# Patient Record
Sex: Male | Born: 1935 | Race: White | Hispanic: No | Smoking: Former smoker
Health system: Southern US, Community
[De-identification: ages and names within clinical notes are randomized; demographics above are authoritative.]

## PROBLEM LIST (undated history)

## (undated) DIAGNOSIS — M4800 Spinal stenosis, site unspecified: Secondary | ICD-10-CM

## (undated) DIAGNOSIS — I639 Cerebral infarction, unspecified: Secondary | ICD-10-CM

## (undated) DIAGNOSIS — C801 Malignant (primary) neoplasm, unspecified: Secondary | ICD-10-CM

## (undated) DIAGNOSIS — G709 Myoneural disorder, unspecified: Secondary | ICD-10-CM

## (undated) DIAGNOSIS — G4730 Sleep apnea, unspecified: Secondary | ICD-10-CM

## (undated) DIAGNOSIS — S83209A Unspecified tear of unspecified meniscus, current injury, unspecified knee, initial encounter: Secondary | ICD-10-CM

## (undated) DIAGNOSIS — M1990 Unspecified osteoarthritis, unspecified site: Secondary | ICD-10-CM

## (undated) DIAGNOSIS — L509 Urticaria, unspecified: Secondary | ICD-10-CM

## (undated) DIAGNOSIS — K589 Irritable bowel syndrome without diarrhea: Secondary | ICD-10-CM

## (undated) HISTORY — PX: KNEE ARTHROSCOPY: SUR90

## (undated) HISTORY — DX: Spinal stenosis, site unspecified: M48.00

## (undated) HISTORY — DX: Irritable bowel syndrome, unspecified: K58.9

## (undated) HISTORY — PX: HEMORRHOID SURGERY: SHX153

## (undated) HISTORY — PX: COLONOSCOPY: SHX174

## (undated) HISTORY — PX: APPENDECTOMY: SHX54

## (undated) HISTORY — PX: SHOULDER ARTHROSCOPY WITH ROTATOR CUFF REPAIR AND SUBACROMIAL DECOMPRESSION: SHX5686

---

## 1999-05-27 ENCOUNTER — Ambulatory Visit (HOSPITAL_COMMUNITY): Admission: RE | Admit: 1999-05-27 | Discharge: 1999-05-27 | Payer: Self-pay | Admitting: Gastroenterology

## 1999-12-21 ENCOUNTER — Ambulatory Visit (HOSPITAL_COMMUNITY): Admission: RE | Admit: 1999-12-21 | Discharge: 1999-12-21 | Payer: Self-pay | Admitting: Orthopedic Surgery

## 2002-02-24 ENCOUNTER — Ambulatory Visit (HOSPITAL_COMMUNITY): Admission: RE | Admit: 2002-02-24 | Discharge: 2002-02-24 | Payer: Self-pay | Admitting: Gastroenterology

## 2004-01-28 ENCOUNTER — Observation Stay (HOSPITAL_COMMUNITY): Admission: RE | Admit: 2004-01-28 | Discharge: 2004-01-29 | Payer: Self-pay | Admitting: Orthopedic Surgery

## 2005-05-16 ENCOUNTER — Encounter: Admission: RE | Admit: 2005-05-16 | Discharge: 2005-05-16 | Payer: Self-pay | Admitting: Internal Medicine

## 2008-06-29 ENCOUNTER — Ambulatory Visit (HOSPITAL_COMMUNITY): Admission: RE | Admit: 2008-06-29 | Discharge: 2008-06-29 | Payer: Self-pay | Admitting: Gastroenterology

## 2008-07-28 ENCOUNTER — Ambulatory Visit (HOSPITAL_COMMUNITY): Admission: RE | Admit: 2008-07-28 | Discharge: 2008-07-28 | Payer: Self-pay | Admitting: Gastroenterology

## 2010-03-17 ENCOUNTER — Other Ambulatory Visit: Payer: Self-pay | Admitting: Psychiatry

## 2010-03-23 ENCOUNTER — Ambulatory Visit
Admission: RE | Admit: 2010-03-23 | Discharge: 2010-03-23 | Disposition: A | Discharge status: Home / Self Care | Payer: Medicare Other | Source: Ambulatory Visit | Attending: Psychiatry | Admitting: Psychiatry

## 2010-06-07 NOTE — Op Note (Signed)
Jose [name], Jose [name]         ACCOUNT NO.:  [id number]   MEDICAL RECORD NO.:  [id number]          PATIENT TYPE:  AMB   LOCATION:  ENDO                         FACILITY:  [facility]   PHYSICIAN:  John C. Madilyn Fireman, M.D.    DATE OF BIRTH:  29-Oct-1935   DATE OF PROCEDURE:  07/28/2008  DATE OF DISCHARGE:                               OPERATIVE REPORT   INDICATIONS FOR PROCEDURE:  Pyloric stenosis.   PROCEDURE:  The patient was placed in the left lateral decubitus  position and placed on the pulse monitor with continuous low-flow oxygen  delivered by nasal cannula.  He was sedated with 150 mcg IV fentanyl and  15.5 mg IV Versed.  Olympus video endoscope was advanced under direct  vision into the oropharynx and esophagus.  The esophagus was straight  and of normal caliber with the squamocolumnar line at 38 cm and the  stomach was significantly dilated and had some old food scattered around  the lumen in various places.  There appeared to be somewhat of an ovoid  deformity the pylorus and the scope was advanced to it and it did not  appear to be too small to admit passage of the scope but due to bowing  in the elongated stomach, the scope would not pass through it.  The  scope was withdrawn and a standard colonoscope inserted down to the  pylorus but despite the larger scope's longer length, I could not force  it through the opening although it seemed large enough to admit the  scope with the scope looping back into the elongated stomach.  Finally,  I tried a pediatric colonoscope and was able to pop through what  appeared to be the pylorus, but just beyond that there was a second  narrowing with similar appearance and the scope was popped through that  into the duodenum.  This appeared to be consistent with previous  endoscopies which showed a false pylorus appearance and it was difficult  to tell whether the first or second narrowing was the true pylorus.  After these attempts were done, both  apparently stenotic areas were  bloodied and friable and neither one appeared significantly narrowed and  I did not do a balloon dilatation.  The scope was then withdrawn and the  patient returned to the recovery room in stable condition.  He tolerated  the procedure well.  There were no immediate complications.   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   PLAN:  Since the patient's symptoms have improved since his initial  presentation, we will continue medical therapy with acid blockade and  Reglan and see him back in the office.  Depending on how he does, will  consider repeating study with balloon dilatation attempts.           ______________________________  Everardo All Madilyn Fireman, M.D.     JCH/MEDQ  D:  07/28/2008  T:  07/28/2008  Job:  [id number] 

## 2010-06-10 NOTE — Procedures (Signed)
[place]  Patient:    Jose [name], Jose [name]                MRN: [id number] Proc. Date: 05/27/99 Adm. Date:  [id number] Disc. Date: [id number] Attending:  Louie Bun CC:         Georg Ruddle. Viviann Spare, M.D.                           Procedure Report  PROCEDURE:  Esophagogastroduodenoscopy with biopsy.  ENDOSCOPIST:  Everardo All. Madilyn Fireman, M.D.  ANESTHESIA:  INDICATIONS FOR PROCEDURE:  Chronic dyspeptic complaints with findings seven years ago on EGD consistent with false pylorus appearance, probably from previous healed peptic ulcer disease.  He had had chronic continued dyspepsia despite frequent se of acid blocking medications, and we decided to reassess the anatomy of his upper GI tract at this time.  DESCRIPTION OF PROCEDURE:  The patient was placed in the left lateral decubitus  position and placed on the pulse monitor with continuous low flow oxygen delivered by nasal cannula.  He was sedated with 100 mg of IV Demerol and 9 mg of IV Versed. The Olympus video endoscope was advanced under direct vision into the oropharynx and esophagus.  The esophagus was straight and of normal caliber at the squamocolumnar line at 38 cm.  There was no visible hiatal hernia, ring stricture, or other abnormality at the GE junction.  The stomach was entered and a small amount of liquid secretions were suctioned from the fundus.  Retroflexed view of the cardia was unremarkable.  The fundus and body appeared normal.  There was no retained food in the stomach.  The abdomen appeared normal with the exception of a slit-like exit which appeared to be a false narrowing, not the true pylorus. This was approached and it was fairly easily intubated with the scope, and there was a slight dilated area beyond it followed by what appeared to be the true pylorus.  The length between the two narrowed areas appeared to be no more than 2 cm. The pylorus itself was  not deformed and there was no discrete ulcer or erosion noted in the vicinity of the false or true pylorus.  The duodenum was entered.  Both the  bulb and second portion were well-inspected and appeared to be within normal limits.  Small bowel biopsies were obtained to rule out a sprue-like lesion and a CLOtest was also obtained.  The scope was then withdrawn and the patient returned to the recovery room in stable condition.  He tolerated the procedure well and there were no immediate complications.  IMPRESSION:  Distal antral deformity, probably from previous peptic ulcer disease, otherwise normal endoscopy.  No evidence endoscopically for gastric outlet obstruction or gastroparesis.  PLAN:  Will await CLOtest and small bowel biopsy, and make further recommendations. DD:  05/27/99 TD:  05/31/99 Job: [id number]

## 2010-06-10 NOTE — Op Note (Signed)
   NAME:  Jose [name], KARMAN                   ACCOUNT NO.:  [id number]   MEDICAL RECORD NO.:  [id number]                   PATIENT TYPE:  AMB   LOCATION:  ENDO                                 [facility]ACILITY:  [facility]   PHYSICIAN:  John C. Madilyn Fireman, M.D.                 DATE OF BIRTH:  1935-09-27   DATE OF PROCEDURE:  02/24/2002  DATE OF DISCHARGE:                                 OPERATIVE REPORT   PROCEDURE:  Colonoscopy.   INDICATION FOR PROCEDURE:  Colon cancer screening in a 75 year old patient  with no previous colon screening.   DESCRIPTION OF PROCEDURE:  The patient was placed in the left lateral  decubitus position and placed on the pulse monitor with continuous low-flow  oxygen delivered by nasal cannula.  He was sedated with 100 mcg IV fentanyl  and 10 mg IV Versed.  The Olympus video colonoscope was inserted into the  rectum and advanced to the cecum, confirmed by transillumination of  McBurney's point and visualization of the ileocecal valve and appendiceal  orifice.  The prep was excellent.  The cecum, ascending, and transverse  colon appeared normal with no masses, polyps, diverticula, or other mucosal  abnormalities.  Within the descending and sigmoid colon there were seen a  few scattered diverticula and no other abnormalities.  The rectum appeared  normal, and retroflexed view of the anus revealed no obvious internal  hemorrhoids.  The colonoscope was then withdrawn and the patient returned to  the recovery room in stable condition.  He tolerated the procedure well, and  there were no immediate complications.   IMPRESSION:  Left-sided diverticulosis, otherwise normal study.   PLAN:  Sigmoidoscopy in five years and consider colonoscopy in 10 years.                                               John C. Madilyn Fireman, M.D.    JCH/MEDQ  D:  02/24/2002  T:  02/24/2002  Job:  [id number]

## 2010-06-10 NOTE — Op Note (Signed)
[facility]  Patient:    Jose [name], Jose [name]                MRN: [id number] Proc. Date: 12/21/99 Adm. 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  POSTOPERATIVE DIAGNOSES: 1. Glenohumeral arthritis with partial rotator cuff tear. 2. Chronic impingement syndrome with tendonopathy of rotator cuff, right    shoulder.  OPERATION: 1. Right shoulder arthroscopy with shaving of rotator cuff, debridement of    labrum, and synovectomy. 2. Arthroscopic subacromial decompression.  SURGEON:  Illene Labrador. [place]ton, M.D.  ASSISTANT:  Della Goo, P.A.  ANESTHESIA:  General.  INDICATIONS.  Long history of right shoulder problems.  MRI demonstrates some degenerative changes to the [facility] joint and type 2 acromion as well as tendonopathy of the rotator cuff.  The patient has failed to respond to usual nonsurgical treatment and consequently is here for the above-mentioned surgery.  DESCRIPTION OF PROCEDURE:  Satisfactory general anesthesia, beach chair position on the Schlein frame.  Right shoulder girdle was prepped with DuraPrep, draped in sterile field.  The anatomy of the shoulder was marked out including the posterior soft spot.  The coracoid and tentative lateral portal three fingerbreadths from the lateral acromion.  First through a posterior soft spot, I was able to atraumatically enter the shoulder joint with blunt cannula.  On inspection, found some erosion of the humeral head near the rotator cuff and also some degeneration of the labrum as well as wear on the glenoid.  He also had what appeared to be partial undersurface tear of the rotator cuff and a good bit of synovitis obstructing the joint.  Clinically, he had felt like something was inside the joint giving him difficulty.  I checked him for  instability and found none with external rotation of the arm and internal rotation of the arm.  The long head of biceps tendon was of normal appearance.  I then advanced the scope anteriorly between the biceps tendon and subscapularis, placed a switching stick, and additional metal cannula to admit of 4.2 shaver.  I then began debriding down the synovitis in the joint, the underneath surface of the rotator cuff, and the glenoid and labrum.  Final pictures were taken after I had completed the shaving and debridement.  Fluid was evacuated from the glenohumeral joint.  I then redirected the scope to the subacromial area and made a lateral stable wound and placed a 4.2 shaver into the subacromial area.  He had a very thick bursa which I then began excising.  There was some bleeding stirred up because of this.  I introduced the underwater Bovie and corrected it.  I then shaved the underneath surface of the acromion with the combination of 4.2 shaver and tissue ablator.  I then alternated back and forth between the shaver, the ablator, and 4.0 bur, shaving down the anterior acromion and prominent spurring.  I took an initial picture with the arm down to the side with the 4.2 shaver between the rotator cuff and the subacromial surface, and I could barely get it in.  I kept burring until, with the arm abducted, there was considerable room between the subacromial space and the rotator cuff.  Final documentary picture was taken.  I evacuated fluid  from the subacromial joint as well.  The portals which I had infiltrated previously with 0.5% Marcaine were reinfiltrated as was the subacromial space.  The portals were then closed with 4-0 nylon mattress sutures.  Betadine and Adaptic dry sterile dressing and shoulder immobilizer were applied.  He tolerated the procedure well and was taken to the recovery room in satisfactory condition.  There were no known complications. DD:  12/21/99 TD:   12/21/99 Job: [id number]

## 2010-06-10 NOTE — Op Note (Signed)
NAMEHIDEO, GOOGE         ACCOUNT NO.:  [id number]   MEDICAL RECORD NO.:  [id number]          PATIENT TYPE:  AMB   LOCATION:  DAY                          FACILITY:  [facility]   PHYSICIAN:  Marlowe Kays, M.D.  DATE OF BIRTH:  1935/06/23   DATE OF PROCEDURE:  01/28/2004  DATE OF DISCHARGE:                                 OPERATIVE REPORT   PREOPERATIVE DIAGNOSIS:  Recurrent chronic impingement syndrome, right  shoulder, with partial versus small full-thickness rotator cuff tear.   POSTOPERATIVE DIAGNOSIS:  Recurrent chronic impingement syndrome, right  shoulder, with partial rotator cuff tear.   OPERATION:  1.  Right shoulder arthroscopy with debridement of underneath surface of      rotator cuff and labrum and biceps tendon.  2.  Arthroscopic subacromial decompression with lysis of adhesions and      inspection of rotator cuff.   SURGEON:  Marlowe Kays, M.D.   ASSISTANTDruscilla Brownie. Cherlynn June.   ANESTHESIA:  General.   PATHOLOGY AND JUSTIFICATION FOR PROCEDURE:  Years ago I had performed an  arthroscopic subacromial decompression.  Recently he has had a flare-up of  right shoulder pain after lifting weights.  This was in August 2005 and in  January 2005 a similar episode.  He had had subacromial steroid injections,  which initially have relieved his symptomatology but this time they did not,  leading to an MRI of December 21, 2003, which showed degenerative changes in  the joint as well as a small full-thickness tear involving the anterior  supraspinatus tendon.  The plan at this time was to do as much as we could  arthroscopically and if it was a small tear noted, do a mini-open repair.  See operative description below for additional details.   PROCEDURE:  Prophylactic antibiotics, preoperative interscalene block,  general anesthesia.  Beach chair position on [employer] frame.  The right  shoulder girdle was prepped with DuraPrep, draped in a sterile  field.  Anatomy of the shoulder joint was marked out.  For hemostasis purposes, I  injected the posterior and lateral portal sites and the subacromial space.  Through the posterior soft spot, I atraumatically entered the glenohumeral  joint.  On inspection, did find wear of the humeral head, some degeneration  of the labrum, mild degeneration of the long head of the biceps tendon, and  a good bit of fraying of the underneath surface of the rotator cuff.  There  did appear to be some fluid accumulation in the anterior subacromial space.  I advanced the scope between the biceps and subscapularis, used a switching  stick, and introduced a metal cannula followed by 4.2 shaver into the  anterior joint, and I then debrided down the labrum, the biceps tendon, and  the undersurface of the rotator cuff.  I then redirected the scope in the  subacromial space and through a  lateral portal used a 4.2 shaver after  inspecting the subacromial space, where I found numerous adhesions and  several bands anteriorly and several bony prominences, including the  underneath surface of the distal clavicle, which could very well be causing  impingement  problems.  The superior surface of the rotator cuff was  excoriated, but I could not find any definite tear on probing.  Accordingly,  I used the 4.2 shaver to remove a good part of the adhesions and then used  the 90 degree vaporizer to remove additional soft tissue and adhesions and  finally used a 4.0 oval bur to resect a good bit of additional bone as well  as the underneath surface of the distal clavicle.  I once again inspected  the cuff, found no evidence for any tear, and could see no indication for  any open portions of the procedure.  All fluid possible was removed from the  subacromial space.  The three portals were closed with 4-0 nylon.  Betadine,  Adaptic, dry sterile dressing, and a shoulder immobilizer were applied.  He  tolerated the procedure well  and was taken to the recovery room in  satisfactory condition with no known complications.      JA/MEDQ  D:  01/28/2004  T:  01/28/2004  Job:  [id number]

## 2010-11-30 ENCOUNTER — Ambulatory Visit (HOSPITAL_BASED_OUTPATIENT_CLINIC_OR_DEPARTMENT_OTHER): Payer: Medicare Other | Attending: Allergy and Immunology

## 2010-11-30 VITALS — Ht 71.0 in | Wt 158.0 lb

## 2010-11-30 DIAGNOSIS — G4733 Obstructive sleep apnea (adult) (pediatric): Secondary | ICD-10-CM | POA: Insufficient documentation

## 2010-12-01 NOTE — Progress Notes (Signed)
Pt complained that eye was bothering him as it was earlier.  Advised pt would let him attempt to flush eye.  Pt stated he was felling anxiety about his eye and would not be able to return to sleep without taking another Xanax.  Advised pt had second pill 1240 and per orders was not authorized to take any more.  Pt stated he would just lay there and watch ceiling because he would not be able to get back to sleep.  Pt stated would lay in bed until he "gets angry and rips wires off at 4am because cant get back to sleep.  Tech advised pt that if he felt he wanted to end study at a later time to inform tech and tech would remove wires.  Tech requested pt try to relax and see if can doze back off to sleep.  Pt listening to music on portable cd player to relax.

## 2010-12-03 DIAGNOSIS — G4733 Obstructive sleep apnea (adult) (pediatric): Secondary | ICD-10-CM

## 2010-12-03 NOTE — Procedures (Signed)
NAME:  Jose [name], Jose [name]         ACCOUNT NO.:  [id number]  MEDICAL RECORD NO.:  [id number]          PATIENT TYPE:  OUT  LOCATION:  SLEEP CENTER                 FACILITY:  [facility]  PHYSICIAN:  Clinton D. Maple Hudson, MD, FCCP, FACPDATE OF BIRTH:  04[birth year]20-1935  DATE OF STUDY:  11/30/2010                           NOCTURNAL POLYSOMNOGRAM  REFERRING PHYSICIAN:  Jessica Priest, M.D.  REFERRING PHYSICIAN:  Jessica Priest, MD  INDICATION FOR STUDY:  Insomnia with sleep apnea.  EPWORTH SLEEPINESS SCORE:  8/24.  BMI 22, weight 158 pounds, height 71 inches, neck 15 inches.  HOME MEDICATIONS:  Charted and reviewed.  SLEEP ARCHITECTURE:  Total sleep time 286 minutes with sleep efficiency 74.4%.  Stage I 15.2%, stage II 64.3%, stage III absent, REM 20.5% of total sleep time.  Sleep latency 15 minutes, REM latency 94.5 minutes, awake after sleep onset 83.5 minutes, arousal index 8.  BEDTIME MEDICATION:  Alprazolam 0.5 mg.  RESPIRATORY DATA:  Apnea/hypopnea index (AHI) 9.4 per hour.  A total of 45 events were scored including 4 obstructive apneas, 1 central apnea, 48 hypopneas.  Most events were recorded while sleeping supine.  REM AHI 8.2 per hour.  There were insufficient numbers of early events to permit application of split protocol CPAP titration on this study night.  Most events occurred after 2:30 a.m.  OXYGEN DATA:  Moderate snoring with oxygen desaturation to a nadir of 85% and a mean oxygen saturation through the study of 94.1% on room air. A total of 2 minutes was recorded during the total recording time with oxygen saturation less than 88% on room air.  CARDIAC DATA:  Sinus rhythm with PACs and PVCs.  MOVEMENT-PARASOMNIA:  No significant movement disturbance.  Bathroom x2.  IMPRESSION-RECOMMENDATION: 1. Sleep architecture was significant for an interval of sustained     spontaneous wakefulness between 12:15 and 1:45 a.m., after     alprazolam 0.5 mg at 21:45 p.m. 2. Mild  obstructive sleep apnea/hypopnea syndrome, AHI 9.4 per hour.     Most events were associated with supine sleep position and REM     during the last third of the night.  Moderate snoring with oxygen     desaturation to a nadir of 85% and a mean saturation through the     study of 94.1% on room air.  There were insufficient numbers     of events to qualify for application of split protocol CPAP     titration on this study.  Return for CPAP titration can be     considered if conservative measures are inadequate.     Clinton D. Maple Hudson, MD, [facility], FACP Diplomate, Biomedical engineer of Sleep Medicine Electronically Signed    CDY/MEDQ  D:  12/03/2010 18:03:13  T:  12/03/2010 19:01:45  Job:  [id number]

## 2010-12-11 ENCOUNTER — Ambulatory Visit (HOSPITAL_BASED_OUTPATIENT_CLINIC_OR_DEPARTMENT_OTHER): Payer: Medicare Other | Attending: Allergy and Immunology | Admitting: Radiology

## 2010-12-11 VITALS — Ht 71.0 in | Wt 158.0 lb

## 2010-12-11 DIAGNOSIS — Z79899 Other long term (current) drug therapy: Secondary | ICD-10-CM | POA: Insufficient documentation

## 2010-12-11 DIAGNOSIS — G4700 Insomnia, unspecified: Secondary | ICD-10-CM | POA: Insufficient documentation

## 2010-12-11 DIAGNOSIS — R51 Headache: Secondary | ICD-10-CM | POA: Insufficient documentation

## 2010-12-11 DIAGNOSIS — G4733 Obstructive sleep apnea (adult) (pediatric): Secondary | ICD-10-CM

## 2010-12-12 NOTE — Progress Notes (Signed)
2323 Patient took a second Xanax.

## 2010-12-14 ENCOUNTER — Encounter (HOSPITAL_BASED_OUTPATIENT_CLINIC_OR_DEPARTMENT_OTHER): Payer: Medicare Other

## 2010-12-17 DIAGNOSIS — G4730 Sleep apnea, unspecified: Secondary | ICD-10-CM

## 2010-12-17 DIAGNOSIS — G4700 Insomnia, unspecified: Secondary | ICD-10-CM

## 2010-12-17 DIAGNOSIS — R51 Headache: Secondary | ICD-10-CM

## 2010-12-17 DIAGNOSIS — Z79899 Other long term (current) drug therapy: Secondary | ICD-10-CM

## 2010-12-17 NOTE — Procedures (Signed)
NAME:  Jose [name], Jose [name]         ACCOUNT NO.:  [id number]  MEDICAL RECORD NO.:  [id number]          PATIENT TYPE:  OUT  LOCATION:  SLEEP CENTER                 FACILITY:  [facility]  PHYSICIAN:  Clinton D. Maple Hudson, MD, FCCP, FACPDATE OF BIRTH:  1935-12-25  DATE OF STUDY:  12/11/2010                           NOCTURNAL POLYSOMNOGRAM  REFERRING PHYSICIAN:  Jessica Priest, M.D.  INDICATION FOR STUDY:  Insomnia with sleep apnea.  EPWORTH SLEEPINESS SCORE:  8/24.  BMI 22, weight 158 pounds, height 71 inches, neck 15 inches.  MEDICATIONS:  Home medications are charted and reviewed.  A baseline diagnostic NPSG on November 30, 2010, recorded an AHI of 9.4 per hour.  CPAP titration is requested.  SLEEP ARCHITECTURE:  Total sleep time 220.5 minutes with sleep efficiency 53.7%.  Stage I was 13.4%, stage II 71.7%.  Stage III absent. REM 15% of total sleep time.  Sleep latency 24.5 minutes.  REM latency 34 minutes.  Awake after sleep onset 51 minute.  Arousal index 9.5. Bedtime medication: Alprazolam.  Sleep onset was at approximately 22:15 p.m. and he was awake for the rest of the night after 3:00 a.m.  RESPIRATORY DATA:  CPAP titration protocol.  CPAP was initiated and maintained at 5 CWP, AHI 0.5 per hour.  He wore a large ResMed Quattro FX full-face mask with a heated humidifier.  OXYGEN DATA:  Snoring was completely prevented by CPAP, with mean oxygen saturation maintained 93.8% on room air.  CARDIAC DATA:  Normal sinus rhythm.  MOVEMENT-PARASOMNIA:  Occasional limb jerks, but with little effect on sleep.  A total of 13 limb movements were counted, of which 4 were associated with arousals or awakening for periodic limb movement with arousal index of 1.1 per hour.  Bathroom x3.  IMPRESSIONS-RECOMMENDATIONS: 1. Successful CPAP titration to a low initial pressure of 5 CWP which     proved sufficient for control, AHI 0.5 per hour.  He wore a large     ResMed Quattro FX full-face mask with  heated humidifier.  Snoring     was prevented and mean oxygen saturation held 93.8% on room air. 2. Baseline diagnostic NPSG on November 30, 2010, recorded AHI 9.4 per     hour. 3. The patient indicated willingness to try CPAP at home, but that he     felt the actual problem was his ongoing tendency to wake in the     middle of the night with headache, which he usually addresses with     alprazolam, ibuprofen, or vodka.  Consider the possibility of     medication withdrawal after bedtime dosing.  A long half-life     medication such as clonazepam might have some clinical value if     appropriate. 4. Very mild sleep disturbance by limb movements.  A total of 13 limb     jerks were counted of which 4 were associated with arousal or     awakening for periodic limb movement with arousal index of 1.1 per     hour which is of doubtful clinical significance.  Note bathroom x3     as an additional source of sleep disturbance.     Clinton D. Maple Hudson, MD, FCCP, FACP Diplomate,  American Board of Sleep Medicine Electronically Signed    CDY/MEDQ  D:  12/17/2010 08:03:02  T:  12/17/2010 08:18:04  Job:  [id number]

## 2011-07-28 ENCOUNTER — Encounter (HOSPITAL_BASED_OUTPATIENT_CLINIC_OR_DEPARTMENT_OTHER): Payer: Medicare Other | Attending: General Surgery

## 2011-08-25 ENCOUNTER — Encounter (HOSPITAL_BASED_OUTPATIENT_CLINIC_OR_DEPARTMENT_OTHER): Payer: Medicare Other | Attending: General Surgery

## 2012-03-22 ENCOUNTER — Other Ambulatory Visit: Payer: Self-pay | Admitting: Orthopedic Surgery

## 2012-04-05 ENCOUNTER — Encounter (HOSPITAL_BASED_OUTPATIENT_CLINIC_OR_DEPARTMENT_OTHER): Payer: Self-pay | Admitting: *Deleted

## 2012-04-05 NOTE — Progress Notes (Signed)
NPO AFTER MN. ARRIVES AT  1030. NEEDS HG. WILL TAKE PRILOSEC AM OF SURG W/ SIP OF WATER.  PT DID NOT TELL ME DOSAGE OF MEDS. , NEED TO UPDATE THIS .

## 2012-04-12 ENCOUNTER — Encounter (HOSPITAL_BASED_OUTPATIENT_CLINIC_OR_DEPARTMENT_OTHER): Payer: Self-pay | Admitting: Anesthesiology

## 2012-04-12 ENCOUNTER — Encounter (HOSPITAL_BASED_OUTPATIENT_CLINIC_OR_DEPARTMENT_OTHER)
Admission: RE | Disposition: A | Discharge status: Home / Self Care | Payer: Self-pay | Source: Ambulatory Visit | Attending: Orthopedic Surgery

## 2012-04-12 ENCOUNTER — Ambulatory Visit (HOSPITAL_BASED_OUTPATIENT_CLINIC_OR_DEPARTMENT_OTHER): Payer: Medicare Other | Admitting: Anesthesiology

## 2012-04-12 ENCOUNTER — Ambulatory Visit (HOSPITAL_BASED_OUTPATIENT_CLINIC_OR_DEPARTMENT_OTHER)
Admission: RE | Admit: 2012-04-12 | Discharge: 2012-04-12 | Disposition: A | Discharge status: Home / Self Care | Payer: Medicare Other | Source: Ambulatory Visit | Attending: Orthopedic Surgery | Admitting: Orthopedic Surgery

## 2012-04-12 DIAGNOSIS — Z9889 Other specified postprocedural states: Secondary | ICD-10-CM

## 2012-04-12 DIAGNOSIS — IMO0002 Reserved for concepts with insufficient information to code with codable children: Secondary | ICD-10-CM | POA: Insufficient documentation

## 2012-04-12 DIAGNOSIS — M1710 Unilateral primary osteoarthritis, unspecified knee: Secondary | ICD-10-CM | POA: Insufficient documentation

## 2012-04-12 DIAGNOSIS — Z79899 Other long term (current) drug therapy: Secondary | ICD-10-CM | POA: Insufficient documentation

## 2012-04-12 DIAGNOSIS — S83289A Other tear of lateral meniscus, current injury, unspecified knee, initial encounter: Secondary | ICD-10-CM | POA: Insufficient documentation

## 2012-04-12 DIAGNOSIS — G4730 Sleep apnea, unspecified: Secondary | ICD-10-CM | POA: Insufficient documentation

## 2012-04-12 DIAGNOSIS — X58XXXA Exposure to other specified factors, initial encounter: Secondary | ICD-10-CM | POA: Insufficient documentation

## 2012-04-12 HISTORY — DX: Unspecified tear of unspecified meniscus, current injury, unspecified knee, initial encounter: S83.209A

## 2012-04-12 HISTORY — DX: Unspecified osteoarthritis, unspecified site: M19.90

## 2012-04-12 HISTORY — DX: Urticaria, unspecified: L50.9

## 2012-04-12 HISTORY — DX: Sleep apnea, unspecified: G47.30

## 2012-04-12 HISTORY — PX: KNEE ARTHROSCOPY WITH LATERAL MENISECTOMY: SHX6193

## 2012-04-12 LAB — POCT HEMOGLOBIN-HEMACUE: Hemoglobin: 13.5 g/dL (ref 13.0–17.0)

## 2012-04-12 SURGERY — ARTHROSCOPY, KNEE, WITH LATERAL MENISCECTOMY
Anesthesia: General | Site: Knee | Laterality: Right | Wound class: Clean

## 2012-04-12 MED ORDER — BUPIVACAINE-EPINEPHRINE 0.5% -1:200000 IJ SOLN
INTRAMUSCULAR | Status: DC | PRN
  Administered 2012-04-12: 20 mL

## 2012-04-12 MED ORDER — SUCRALFATE 1 G PO TABS
1.0000 g | ORAL_TABLET | Freq: Three times a day (TID) | ORAL | Status: DC
  Administered 2012-04-12: 1 g via ORAL
  Filled 2012-04-12: qty 1

## 2012-04-12 MED ORDER — SODIUM CHLORIDE 0.9 % IR SOLN
Status: DC | PRN
  Administered 2012-04-12: 3000 mL

## 2012-04-12 MED ORDER — OXYCODONE HCL 5 MG PO TABS
5.0000 mg | ORAL_TABLET | Freq: Once | ORAL | Status: DC | PRN
  Filled 2012-04-12: qty 1

## 2012-04-12 MED ORDER — FENTANYL CITRATE 0.05 MG/ML IJ SOLN
25.0000 ug | INTRAMUSCULAR | Status: DC | PRN
  Filled 2012-04-12: qty 1

## 2012-04-12 MED ORDER — ACETAMINOPHEN 10 MG/ML IV SOLN
INTRAVENOUS | Status: DC | PRN
  Administered 2012-04-12: 1000 mg via INTRAVENOUS

## 2012-04-12 MED ORDER — DEXAMETHASONE SODIUM PHOSPHATE 10 MG/ML IJ SOLN
INTRAMUSCULAR | Status: DC | PRN
  Administered 2012-04-12: 10 mg via INTRAVENOUS

## 2012-04-12 MED ORDER — HYDROCODONE-ACETAMINOPHEN 10-325 MG PO TABS
1.0000 | ORAL_TABLET | Freq: Four times a day (QID) | ORAL | Status: AC | PRN
  Administered 2012-04-12: 1 via ORAL
  Filled 2012-04-12: qty 1

## 2012-04-12 MED ORDER — PROPOFOL 10 MG/ML IV EMUL
INTRAVENOUS | Status: DC | PRN
  Administered 2012-04-12: 170 mg via INTRAVENOUS

## 2012-04-12 MED ORDER — OXYCODONE HCL 5 MG/5ML PO SOLN
5.0000 mg | Freq: Once | ORAL | Status: DC | PRN
  Filled 2012-04-12: qty 5

## 2012-04-12 MED ORDER — MORPHINE SULFATE 4 MG/ML IJ SOLN
INTRAMUSCULAR | Status: DC | PRN
  Administered 2012-04-12: 4 mg via INTRAVENOUS

## 2012-04-12 MED ORDER — HYDROCODONE-ACETAMINOPHEN 10-325 MG PO TABS: 1.0000 | ORAL_TABLET | Freq: Four times a day (QID) | ORAL | Status: DC | PRN

## 2012-04-12 MED ORDER — LACTATED RINGERS IV SOLN
INTRAVENOUS | Status: DC
  Administered 2012-04-12: 100 mL/h via INTRAVENOUS
  Filled 2012-04-12: qty 1000

## 2012-04-12 MED ORDER — SODIUM CHLORIDE 0.9 % IR SOLN
Status: DC | PRN
  Administered 2012-04-12: 13:00:00

## 2012-04-12 MED ORDER — METOCLOPRAMIDE HCL 5 MG/ML IJ SOLN
10.0000 mg | Freq: Once | INTRAMUSCULAR | Status: DC | PRN
  Filled 2012-04-12: qty 2

## 2012-04-12 MED ORDER — KETOROLAC TROMETHAMINE 15 MG/ML IJ SOLN
INTRAMUSCULAR | Status: DC | PRN
  Administered 2012-04-12: 15 mg via INTRAVENOUS

## 2012-04-12 MED ORDER — FENTANYL CITRATE 0.05 MG/ML IJ SOLN
INTRAMUSCULAR | Status: DC | PRN
  Administered 2012-04-12: 50 ug via INTRAVENOUS
  Administered 2012-04-12 (×2): 25 ug via INTRAVENOUS

## 2012-04-12 MED ORDER — POVIDONE-IODINE 7.5 % EX SOLN
Freq: Once | CUTANEOUS | Status: DC
  Filled 2012-04-12: qty 118

## 2012-04-12 MED ORDER — ONDANSETRON HCL 4 MG/2ML IJ SOLN
INTRAMUSCULAR | Status: DC | PRN
  Administered 2012-04-12: 4 mg via INTRAVENOUS

## 2012-04-12 MED ORDER — LIDOCAINE HCL 1 % IJ SOLN
INTRAMUSCULAR | Status: DC | PRN
  Administered 2012-04-12: 80 mg via INTRADERMAL

## 2012-04-12 SURGICAL SUPPLY — 54 items
BANDAGE ELASTIC 6 VELCRO ST LF (GAUZE/BANDAGES/DRESSINGS) ×3 IMPLANT
BANDAGE ESMARK 6X9 LF (GAUZE/BANDAGES/DRESSINGS) ×1 IMPLANT
BANDAGE GAUZE ELAST BULKY 4 IN (GAUZE/BANDAGES/DRESSINGS) ×2 IMPLANT
BLADE 4.2CUDA (BLADE) IMPLANT
BLADE CUDA 5.5 (BLADE) IMPLANT
BLADE CUDA SHAVER 3.5 (BLADE) ×2 IMPLANT
BLADE CUTTER GATOR 3.5 (BLADE) IMPLANT
BLADE GREAT WHITE 4.2 (BLADE) IMPLANT
BNDG CMPR 9X6 STRL LF SNTH (GAUZE/BANDAGES/DRESSINGS) ×1
BNDG ESMARK 6X9 LF (GAUZE/BANDAGES/DRESSINGS) ×2
CANISTER SUCT LVC 12 LTR MEDI- (MISCELLANEOUS) ×3 IMPLANT
CANISTER SUCTION 1200CC (MISCELLANEOUS) ×1 IMPLANT
CLOTH BEACON ORANGE TIMEOUT ST (SAFETY) ×2 IMPLANT
CUFF TOURN SGL QUICK 34 (TOURNIQUET CUFF) ×2
CUFF TRNQT CYL 34X4X40X1 (TOURNIQUET CUFF) IMPLANT
DRAPE ARTHROSCOPY W/POUCH 114 (DRAPES) ×2 IMPLANT
DRAPE LG THREE QUARTER DISP (DRAPES) ×2 IMPLANT
DRSG EMULSION OIL 3X3 NADH (GAUZE/BANDAGES/DRESSINGS) ×2 IMPLANT
DRSG PAD ABDOMINAL 8X10 ST (GAUZE/BANDAGES/DRESSINGS) ×1 IMPLANT
DURAPREP 26ML APPLICATOR (WOUND CARE) ×2 IMPLANT
ELECT MENISCUS 165MM 90D (ELECTRODE) IMPLANT
ELECT REM PT RETURN 9FT ADLT (ELECTROSURGICAL)
ELECTRODE REM PT RTRN 9FT ADLT (ELECTROSURGICAL) IMPLANT
GLOVE BIOGEL PI IND STRL 8 (GLOVE) ×1 IMPLANT
GLOVE BIOGEL PI INDICATOR 8 (GLOVE) ×1
GLOVE ECLIPSE 7.0 STRL STRAW (GLOVE) ×1 IMPLANT
GLOVE ECLIPSE 8.0 STRL XLNG CF (GLOVE) ×3 IMPLANT
GLOVE INDICATOR 7.0 STRL GRN (GLOVE) ×1 IMPLANT
GLOVE INDICATOR 8.0 STRL GRN (GLOVE) ×3 IMPLANT
GOWN PREVENTION PLUS LG XLONG (DISPOSABLE) ×2 IMPLANT
GOWN STRL REIN XL XLG (GOWN DISPOSABLE) ×2 IMPLANT
IV NS IRRIG 3000ML ARTHROMATIC (IV SOLUTION) ×5 IMPLANT
KNEE WRAP E Z 3 GEL PACK (MISCELLANEOUS) ×2 IMPLANT
NDL HYPO 18GX1.5 BLUNT FILL (NEEDLE) ×1 IMPLANT
NDL SAFETY ECLIPSE 18X1.5 (NEEDLE) ×1 IMPLANT
NEEDLE HYPO 18GX1.5 BLUNT FILL (NEEDLE) ×2 IMPLANT
NEEDLE HYPO 18GX1.5 SHARP (NEEDLE) ×2
PACK ARTHROSCOPY DSU (CUSTOM PROCEDURE TRAY) ×2 IMPLANT
PACK BASIN DAY SURGERY FS (CUSTOM PROCEDURE TRAY) ×2 IMPLANT
PADDING CAST ABS 4INX4YD NS (CAST SUPPLIES) ×1
PADDING CAST ABS COTTON 4X4 ST (CAST SUPPLIES) ×1 IMPLANT
PENCIL BUTTON HOLSTER BLD 10FT (ELECTRODE) IMPLANT
SET ARTHROSCOPY TUBING (MISCELLANEOUS) ×2
SET ARTHROSCOPY TUBING LN (MISCELLANEOUS) ×1 IMPLANT
SPONGE GAUZE 4X4 12PLY (GAUZE/BANDAGES/DRESSINGS) ×2 IMPLANT
STRYKER IMPLANT
SUT ETHIBOND 2 OS 4 DA (SUTURE) IMPLANT
SUT ETHILON 4 0 PS 2 18 (SUTURE) ×2 IMPLANT
SUT VIC AB 0 CT1 36 (SUTURE) IMPLANT
SUT VIC AB 2-0 PS2 27 (SUTURE) IMPLANT
SYRINGE 10CC LL (SYRINGE) ×2 IMPLANT
TOWEL OR 17X24 6PK STRL BLUE (TOWEL DISPOSABLE) ×2 IMPLANT
WAND 90 DEG TURBOVAC W/CORD (SURGICAL WAND) IMPLANT
WATER STERILE IRR 500ML POUR (IV SOLUTION) ×2 IMPLANT

## 2012-04-12 NOTE — Anesthesia Procedure Notes (Signed)
Procedure Name: LMA Insertion Date/Time: 04/12/2012 1:11 PM Performed by: Maris Berger T Pre-anesthesia Checklist: Patient identified, Emergency Drugs available, Suction available and Patient being monitored Patient Re-evaluated:Patient Re-evaluated prior to inductionOxygen Delivery Method: Circle System Utilized Preoxygenation: Pre-oxygenation with 100% oxygen Intubation Type: IV induction Ventilation: Mask ventilation without difficulty LMA: LMA inserted LMA Size: 4.0 Number of attempts: 1 Placement Confirmation: positive ETCO2 Dental Injury: Teeth and Oropharynx as per pre-operative assessment  Comments: Gauze roll between teeth

## 2012-04-12 NOTE — Brief Op Note (Signed)
04/12/2012  2:39 PM  PATIENT:  Quinn Plowman Fobes  77 y.o. male  PRE-OPERATIVE DIAGNOSIS:  RIGHT KNEE TORN MEDIAL AND LATERAL MENISCUS  POST-OPERATIVE DIAGNOSIS:  RIGHT KNEE TORN MEDIAL AND LATERAL MENISCUS  PROCEDURE:  Procedure(s): RIGHT KNEE ARTHROSCOPY WITH PARTIAL MEDIAL AND LATERAL MENISECTOMY (Right)  SURGEON:  Surgeon(s) and Role:    * Drucilla Schmidt, MD - Primary  PHYSICIAN ASSISTANT:   ASSISTANTS:nurse  ANESTHESIA:   local and general  EBL:  Total I/O In: 650 [I.V.:650] Out: -   BLOOD ADMINISTERED:none  DRAINS: none   LOCAL MEDICATIONS USED:  MARCAINE     SPECIMEN:  No Specimen  DISPOSITION OF SPECIMEN:  N/A  COUNTS:  YES  TOURNIQUET:  * Missing tourniquet times found for documented tourniquets in log:  [id number] *  DICTATION: .Other Dictation: Dictation Number [id number]  PLAN OF CARE: Discharge to home after PACU  PATIENT DISPOSITION:  PACU - hemodynamically stable.   Delay start of Pharmacological VTE agent (>24hrs) due to surgical blood loss or risk of bleeding: yes

## 2012-04-12 NOTE — Anesthesia Preprocedure Evaluation (Signed)
Anesthesia Evaluation  Patient identified by MRN, date of birth, ID band Patient awake    Reviewed: Allergy & Precautions, H&P , NPO status , Patient's Chart, lab work & pertinent test results, reviewed documented beta blocker date and time   Airway Mallampati: II TM Distance: >3 FB Neck ROM: full    Dental   Pulmonary sleep apnea ,  breath sounds clear to auscultation        Cardiovascular negative cardio ROS  Rhythm:regular     Neuro/Psych negative neurological ROS  negative psych ROS   GI/Hepatic negative GI ROS, Neg liver ROS,   Endo/Other  negative endocrine ROS  Renal/GU negative Renal ROS  negative genitourinary   Musculoskeletal   Abdominal   Peds  Hematology negative hematology ROS (+)   Anesthesia Other Findings See surgeon's H&P   Reproductive/Obstetrics negative OB ROS                           Anesthesia Physical Anesthesia Plan  ASA: II  Anesthesia Plan: General   Post-op Pain Management:    Induction: Intravenous  Airway Management Planned: LMA  Additional Equipment:   Intra-op Plan:   Post-operative Plan: Extubation in OR  Informed Consent: I have reviewed the patients History and Physical, chart, labs and discussed the procedure including the risks, benefits and alternatives for the proposed anesthesia with the patient or authorized representative who has indicated his/her understanding and acceptance.   Dental Advisory Given  Plan Discussed with: CRNA and Surgeon  Anesthesia Plan Comments:         Anesthesia Quick Evaluation

## 2012-04-12 NOTE — Transfer of Care (Signed)
Immediate Anesthesia Transfer of Care Note  Patient: Jose [name]  Procedure(s) Performed: Procedure(s): RIGHT KNEE ARTHROSCOPY WITH PARTIAL MEDIAL AND LATERAL MENISECTOMY (Right)  Patient Location: PACU  Anesthesia Type:General  Level of Consciousness: awake, alert  and oriented  Airway & Oxygen Therapy: Patient Spontanous Breathing and Patient connected to nasal cannula oxygen  Post-op Assessment: Report given to PACU RN  Post vital signs: Reviewed and stable  Complications: No apparent anesthesia complications

## 2012-04-12 NOTE — Anesthesia Postprocedure Evaluation (Signed)
Anesthesia Post Note  Patient: Jose [name]  Procedure(s) Performed: Procedure(s) (LRB): RIGHT KNEE ARTHROSCOPY WITH PARTIAL MEDIAL AND LATERAL MENISECTOMY (Right)  Anesthesia type: General  Patient location: PACU  Post pain: Pain level controlled  Post assessment: Patient's Cardiovascular Status Stable  Last Vitals:  Filed Vitals:   04/12/12 1445  BP: 138/70  Pulse: 81  Temp:   Resp: 20    Post vital signs: Reviewed and stable  Level of consciousness: alert  Complications: No apparent anesthesia complications

## 2012-04-12 NOTE — H&P (Signed)
Jose [name] is an 77 y.o. male.   Chief Complaint:painful rt knee HPI: MRI demonstrates torn medial and lateral menisci  Past Medical History  Diagnosis Date  . Acute meniscal tear of knee     RIGHT KNEE  . Mild sleep apnea     PER STUDY  11-30-2010--  NO CPAP  . Arthritis   . Hives     PER PT UNKNOWN WHY    Past Surgical History  Procedure Laterality Date  . Shoulder arthroscopy with rotator cuff repair and subacromial decompression  12-21-1999  &  01-28-2004    History reviewed. No pertinent family history. Social History:  reports that he has quit smoking. His smoking use included Cigarettes. He smoked 0.00 packs per day for 17 years. He does not have any smokeless tobacco history on file. He reports that  drinks alcohol. He reports that he does not use illicit drugs.  Allergies: No Known Allergies  Medications Prior to Admission  Medication Sig Dispense Refill  . ALPRAZolam (XANAX) 0.25 MG tablet Take 0.25 mg by mouth at bedtime as needed for sleep.      . celecoxib (CELEBREX) 200 MG capsule Take 200 mg by mouth 2 (two) times daily.      . Cholecalciferol (VITAMIN D-3 PO) Take by mouth.      Marland Kitchen glucosamine-chondroitin 500-400 MG tablet Take 1 tablet by mouth 3 (three) times daily.      . Lactobacillus (ACIDOPHILUS PO) Take by mouth.      . METHYLPHENIDATE HCL CD PO Take by mouth.      . mometasone (NASONEX) 50 MCG/ACT nasal spray Place 2 sprays into the nose daily.      . Multiple Vitamin (MULTIVITAMIN) tablet Take 1 tablet by mouth daily.      . Multiple Vitamins-Minerals (ZINC PO) Take by mouth.      Marland Kitchen omeprazole (PRILOSEC) 20 MG capsule Take 20 mg by mouth every morning.       . vitamin C (ASCORBIC ACID) 500 MG tablet Take 500 mg by mouth daily.      Marland Kitchen aspirin 81 MG tablet Take 81 mg by mouth daily.        Results for orders placed during the hospital encounter of 04/12/12 (from the past 48 hour(s))  POCT HEMOGLOBIN-HEMACUE     Status: None   Collection Time     04/12/12 11:47 AM      Result Value Range   Hemoglobin 13.5  13.0 - 17.0 g/dL   No results found.  ROS  Blood pressure 144/76, pulse 57, temperature 97 F (36.1 C), temperature source Oral, resp. rate 16, height 5' 10.5" (1.791 m), weight 75.07 kg (165 lb 8 oz), SpO2 97.00%. Physical Exam  Constitutional: He is oriented to person, place, and time. He appears well-developed and well-nourished.  HENT:  Head: Normocephalic and atraumatic.  Right Ear: External ear normal.  Left Ear: External ear normal.  Nose: Nose normal.  Mouth/Throat: Oropharynx is clear and moist.  Eyes: Conjunctivae and EOM are normal. Pupils are equal, round, and reactive to light.  Neck: Normal range of motion. Neck supple.  Cardiovascular: Normal rate, regular rhythm, normal heart sounds and intact distal pulses.   Respiratory: Effort normal and breath sounds normal.  GI: Soft. Bowel sounds are normal.  Musculoskeletal: Normal range of motion.  Neurological: He is alert and oriented to person, place, and time. He has normal reflexes.  Skin: Skin is warm and dry.  Psychiatric: He has a normal  mood and affect. His behavior is normal. Judgment and thought content normal.     Assessment/Plan Torn medial and lateral menisci rt knee Rt knee arthroscopy with partial medial and lateral meniscectomies  APLINGTON,JAMES P 04/12/2012, 12:57 PM

## 2012-04-15 ENCOUNTER — Encounter (HOSPITAL_BASED_OUTPATIENT_CLINIC_OR_DEPARTMENT_OTHER): Payer: Self-pay | Admitting: Orthopedic Surgery

## 2012-04-15 NOTE — Op Note (Signed)
Jose [name], HARTE NO.:  [id number]  MEDICAL RECORD NO.:  [id number]  LOCATION:                                FACILITY:  [facility]  PHYSICIAN:  Marlowe Kays, M.D.  DATE OF BIRTH:  [birth year]  DATE OF PROCEDURE:  04/12/2012 DATE OF DISCHARGE:                              OPERATIVE REPORT   PREOPERATIVE DIAGNOSES: 1. Torn medial and lateral menisci. 2. Osteoarthritis, right knee.  POSTOPERATIVE DIAGNOSES: 1. Torn medial and lateral menisci. 2. Osteoarthritis, right knee.  OPERATION:  Right knee arthroscopy with, 1. Partial medial and lateral meniscectomies. 2. Shaving of lateral femoral condyle.  SURGEON:  Marlowe Kays, M.D.  ASSISTANT:  Nurse.  ANESTHESIA:  General.  PATHOLOGY AND JUSTIFICATION FOR PROCEDURE:  Painful right knee with an MRI demonstrating both osteoarthritis and tears of both menisci.  These findings were confirmed at surgery as discussed below.  PROCEDURE:  Satisfactory general anesthesia, Ace wrap, and knee support to left lower extremity, pneumatic tourniquet applied to the right lower extremity with the leg Esmarched out nonsterilely and tourniquet inflated to 300 mmHg.  Thigh stabilizer was then applied and the right leg prepped with DuraPrep from stabilizer to ankle and draped in sterile field.  Time-out performed.  Superior medial saline inflow.  First, an anterolateral portal medial compartment knee joint was evaluated.  He did not have a significant amount of wear of the medial femoral condyle, but the medial tibial plateau was quite frayed and in the posterior medial corner there was an area of full-thickness wear.  His meniscus was torn almost in its entirety from about the anterior middle third all the way into the intercondylar area.  This was pictured and I resected out the torn area, which is almosta bucket-handle with scissors just before the curve and then resected the remainder of the meniscus back to stable  rim with a combination of baskets and the 3.5 shaver.  Looking at the medial gutter and suprapatellar area, no abnormalities were noted.  His ACL was intact.  Looking laterally, he had a superficial tear in line with the fibers of the lateral meniscus, which I shaved down until smooth.  This seemed to correlate with an area of wear grade 2-3/4 in the lateral femoral condyle, which I shaved down until smooth as well.  There was some fraying of the posterior curve and I shaved this down in addition.  Knee joint was then irrigated until clear and all fluid possible removed.  I closed 2 entry portals with 4-0 nylon and injected 20 mL of 0.5% Marcaine with adrenaline and 4 mg of morphine through the inflow apparatus, and I closed this portal with 4-0 nylon as well. Betadine, Adaptic, dry sterile dressing were applied.  He tolerated the procedure well.  After releasing the tourniquet, he was taken to the recovery room in a satisfactory condition with no known complications.          ______________________________ Marlowe Kays, M.D.     JA/MEDQ  D:  04/12/2012  T:  04/13/2012  Job:  [id number]

## 2013-12-11 ENCOUNTER — Other Ambulatory Visit: Payer: Self-pay | Admitting: Internal Medicine

## 2013-12-11 DIAGNOSIS — M545 Low back pain, unspecified: Secondary | ICD-10-CM

## 2013-12-14 ENCOUNTER — Ambulatory Visit
Admission: RE | Admit: 2013-12-14 | Discharge: 2013-12-14 | Disposition: A | Discharge status: Home / Self Care | Payer: Medicare Other | Source: Ambulatory Visit | Attending: Internal Medicine | Admitting: Internal Medicine

## 2013-12-14 DIAGNOSIS — M545 Low back pain, unspecified: Secondary | ICD-10-CM

## 2013-12-25 DIAGNOSIS — M5126 Other intervertebral disc displacement, lumbar region: Secondary | ICD-10-CM | POA: Insufficient documentation

## 2014-05-07 ENCOUNTER — Other Ambulatory Visit: Payer: Self-pay | Admitting: Internal Medicine

## 2014-05-07 DIAGNOSIS — M5136 Other intervertebral disc degeneration, lumbar region: Secondary | ICD-10-CM

## 2014-05-07 DIAGNOSIS — M545 Low back pain: Secondary | ICD-10-CM

## 2014-05-18 ENCOUNTER — Ambulatory Visit
Admission: RE | Admit: 2014-05-18 | Discharge: 2014-05-18 | Disposition: A | Discharge status: Home / Self Care | Payer: Medicare Other | Source: Ambulatory Visit | Attending: Internal Medicine | Admitting: Internal Medicine

## 2014-05-18 DIAGNOSIS — M5136 Other intervertebral disc degeneration, lumbar region: Secondary | ICD-10-CM

## 2014-05-18 DIAGNOSIS — M545 Low back pain: Secondary | ICD-10-CM

## 2015-03-29 DIAGNOSIS — L821 Other seborrheic keratosis: Secondary | ICD-10-CM | POA: Diagnosis not present

## 2015-03-29 DIAGNOSIS — D692 Other nonthrombocytopenic purpura: Secondary | ICD-10-CM | POA: Diagnosis not present

## 2015-03-29 DIAGNOSIS — Z85828 Personal history of other malignant neoplasm of skin: Secondary | ICD-10-CM | POA: Diagnosis not present

## 2015-03-29 DIAGNOSIS — L309 Dermatitis, unspecified: Secondary | ICD-10-CM | POA: Diagnosis not present

## 2015-07-08 DIAGNOSIS — R5382 Chronic fatigue, unspecified: Secondary | ICD-10-CM | POA: Diagnosis not present

## 2015-07-08 DIAGNOSIS — M545 Low back pain: Secondary | ICD-10-CM | POA: Diagnosis not present

## 2015-11-25 DIAGNOSIS — D225 Melanocytic nevi of trunk: Secondary | ICD-10-CM | POA: Diagnosis not present

## 2015-11-25 DIAGNOSIS — L218 Other seborrheic dermatitis: Secondary | ICD-10-CM | POA: Diagnosis not present

## 2015-11-25 DIAGNOSIS — D1800 Hemangioma unspecified site: Secondary | ICD-10-CM | POA: Diagnosis not present

## 2015-11-25 DIAGNOSIS — Z85828 Personal history of other malignant neoplasm of skin: Secondary | ICD-10-CM | POA: Diagnosis not present

## 2015-11-25 DIAGNOSIS — D1801 Hemangioma of skin and subcutaneous tissue: Secondary | ICD-10-CM | POA: Diagnosis not present

## 2015-11-25 DIAGNOSIS — L821 Other seborrheic keratosis: Secondary | ICD-10-CM | POA: Diagnosis not present

## 2015-11-26 DIAGNOSIS — H25013 Cortical age-related cataract, bilateral: Secondary | ICD-10-CM | POA: Diagnosis not present

## 2015-11-26 DIAGNOSIS — H43813 Vitreous degeneration, bilateral: Secondary | ICD-10-CM | POA: Diagnosis not present

## 2015-11-26 DIAGNOSIS — H524 Presbyopia: Secondary | ICD-10-CM | POA: Diagnosis not present

## 2015-11-26 DIAGNOSIS — H2513 Age-related nuclear cataract, bilateral: Secondary | ICD-10-CM | POA: Diagnosis not present

## 2015-12-08 DIAGNOSIS — Z23 Encounter for immunization: Secondary | ICD-10-CM | POA: Diagnosis not present

## 2016-01-13 DIAGNOSIS — Z0001 Encounter for general adult medical examination with abnormal findings: Secondary | ICD-10-CM | POA: Diagnosis not present

## 2016-01-13 DIAGNOSIS — R351 Nocturia: Secondary | ICD-10-CM | POA: Diagnosis not present

## 2016-01-13 DIAGNOSIS — E559 Vitamin D deficiency, unspecified: Secondary | ICD-10-CM | POA: Diagnosis not present

## 2016-01-13 DIAGNOSIS — Z79899 Other long term (current) drug therapy: Secondary | ICD-10-CM | POA: Diagnosis not present

## 2016-01-13 DIAGNOSIS — R5382 Chronic fatigue, unspecified: Secondary | ICD-10-CM | POA: Diagnosis not present

## 2016-01-13 DIAGNOSIS — M545 Low back pain: Secondary | ICD-10-CM | POA: Diagnosis not present

## 2016-01-13 DIAGNOSIS — R5381 Other malaise: Secondary | ICD-10-CM | POA: Diagnosis not present

## 2016-01-25 DIAGNOSIS — G8929 Other chronic pain: Secondary | ICD-10-CM | POA: Diagnosis not present

## 2016-01-25 DIAGNOSIS — M1712 Unilateral primary osteoarthritis, left knee: Secondary | ICD-10-CM | POA: Diagnosis not present

## 2016-01-25 DIAGNOSIS — M25511 Pain in right shoulder: Secondary | ICD-10-CM | POA: Diagnosis not present

## 2016-03-08 ENCOUNTER — Encounter: Payer: Self-pay | Admitting: Neurology

## 2016-03-08 ENCOUNTER — Ambulatory Visit (INDEPENDENT_AMBULATORY_CARE_PROVIDER_SITE_OTHER): Payer: Self-pay | Admitting: Neurology

## 2016-03-08 VITALS — BP 160/84 | HR 60 | Resp 20 | Ht 69.0 in | Wt 154.0 lb

## 2016-03-08 DIAGNOSIS — G479 Sleep disorder, unspecified: Secondary | ICD-10-CM

## 2016-03-08 NOTE — Progress Notes (Signed)
Subjective:    Patient ID: Jose [name] is a 81 y.o. male.  HPI   Jose [name] is a 81 year old right-handed gentleman with an underlying medical history of spinal stenosis, irritable bowel syndrome, arthritis, obstructive sleep apnea, hyperlipidemia, reflux disease, and skin cancer, who reports a 40 year history of chronic fatigue syndrome. He states that he was diagnosed by a specialist at the time. I reviewed Dr. Adela [name] office note from 07/08/2015. He had blood work in your office on 01/13/2016 which I reviewed: Vitamin D level was 61, total cholesterol 203, LDL 89, glucose 91, BUN 17, creatinine 0.98, otherwise CMP unremarkable. Of note, he had an MRI lumbar spine without contrast on 05/18/2014 which I reviewed: Impression: Slight decrease in the size of the large soft disc extrusion at L1-2 with less mass effect upon the tip of the conus and thecal sac. No other change in the lumbar spine since 12/14/2013. He was seen by Jose [name] on 03/19/2014 for his low back pain and lumbar spine stenosis. He is status post right knee arthroscopic surgery on 04/12/2012. He had sleep study testing in 2012 and was diagnosed with mild obstructive sleep apnea and placed on CPAP therapy. He stopped using CPAP. He says that CPAP did not help, he returned his CPAP machine after about 9 months or so. He is not sure why he is here today. He does not want to consider sleep study testing or being treated for sleep apnea. He is not very forthcoming with his information, states that he drinks "copious" amounts of alcohol, but estimates one bottle of wine per day, "considerable" amount of caffeine but does not quantify. He takes caffeine pills, unclear amount, he also takes Ritalin, he takes Xanax at night for sleep, reports sleep disruption, denies daytime somnolence but endorses taking at least 2 naps per day. He does not wish to pursue sleep study testing and we mutually agreed to forego today's appointment as  he has not interested in pursuing additional sleep study testing to exclude an underlying organic sleep disorder. I have offered to not charge him for this visit today.  Review of Systems  Neurological:       Pt presents today to discuss his chronic fatigue syndrome. Pt says that he has been to multiple doctors and no one can help him. Pt says that he takes a lot of naps.  Epworth Sleepiness Scale 0= would never doze 1= slight chance of dozing 2= moderate chance of dozing 3= high chance of dozing  Sitting and reading: 0 Watching TV: 0 Sitting inactive in a public place (ex. Theater or meeting): 0 As a passenger in a car for an hour without a break: 0 Lying down to rest in the afternoon: 0 Sitting and talking to someone: 0 Sitting quietly after lunch (no alcohol): 0 In a car, while stopped in traffic: 0 Total: 0      Objective:  Neurologic Exam  Physical Exam I did not do a Physical exam today. We mutually agreed to forego this appointment and I will not charge him today. Assessment and Plan:  He does not wish to consider sleep study testing.  He does not wish to cut back on caffeine or alcohol intake.  He is not sure, why he was sent here, reports a 40 year Hx of chronic fatigue syndrome, and reports having seen several doctors and having spent a lot of money on doctors appointments ("200 k"). Patient is encouraged to FU with PCP.

## 2016-03-08 NOTE — Patient Instructions (Signed)
We will not charge you for this visit. I will let Dr. Mellody Drown know about our brief visit.

## 2016-03-22 DIAGNOSIS — Z5181 Encounter for therapeutic drug level monitoring: Secondary | ICD-10-CM | POA: Diagnosis not present

## 2016-05-25 ENCOUNTER — Encounter (HOSPITAL_COMMUNITY): Payer: Self-pay | Admitting: Emergency Medicine

## 2016-05-25 ENCOUNTER — Emergency Department (HOSPITAL_COMMUNITY)
Admission: EM | Admit: 2016-05-25 | Discharge: 2016-05-26 | Disposition: A | Discharge status: Home / Self Care | Payer: Medicare Other | Attending: Emergency Medicine | Admitting: Emergency Medicine

## 2016-05-25 DIAGNOSIS — Z7982 Long term (current) use of aspirin: Secondary | ICD-10-CM | POA: Diagnosis not present

## 2016-05-25 DIAGNOSIS — R040 Epistaxis: Secondary | ICD-10-CM | POA: Diagnosis not present

## 2016-05-25 DIAGNOSIS — Z87891 Personal history of nicotine dependence: Secondary | ICD-10-CM | POA: Insufficient documentation

## 2016-05-25 DIAGNOSIS — M542 Cervicalgia: Secondary | ICD-10-CM | POA: Diagnosis not present

## 2016-05-25 DIAGNOSIS — M47812 Spondylosis without myelopathy or radiculopathy, cervical region: Secondary | ICD-10-CM | POA: Diagnosis not present

## 2016-05-25 MED ORDER — SILVER NITRATE-POT NITRATE 75-25 % EX MISC
2.0000 | Freq: Once | CUTANEOUS | Status: AC
  Administered 2016-05-25: 2 via TOPICAL
  Filled 2016-05-25: qty 2

## 2016-05-25 MED ORDER — TRAMADOL HCL 50 MG PO TABS: 50.0000 mg | ORAL_TABLET | Freq: Four times a day (QID) | ORAL | 0 refills | Status: DC | PRN

## 2016-05-25 MED ORDER — OXYMETAZOLINE HCL 0.05 % NA SOLN
1.0000 | Freq: Once | NASAL | Status: AC
  Administered 2016-05-25: 1 via NASAL
  Filled 2016-05-25: qty 15

## 2016-05-25 MED ORDER — TRAMADOL HCL 50 MG PO TABS
50.0000 mg | ORAL_TABLET | Freq: Once | ORAL | Status: AC
Start: 2016-05-25 — End: 2016-05-25
  Administered 2016-05-25: 50 mg via ORAL
  Filled 2016-05-25: qty 1

## 2016-05-25 NOTE — ED Provider Notes (Signed)
[facility] DEPT Provider Note   CSN: [id number] Arrival date & time: 05/25/16  2154     History   Chief Complaint Chief Complaint  Patient presents with  . Epistaxis    HPI Jose [name] is a 81 y.o. male.  He reports spontaneous nosebleed and tonight while he was in the bathtub.  No known trauma.  No preceding URI symptoms.  No recent fever, chills, chest pain, weakness or dizziness.  No similar problem in the past.  There are no other known modifying factors.  HPI  Past Medical History:  Diagnosis Date  . Acute meniscal tear of knee    RIGHT KNEE  . Arthritis   . Hives    PER PT UNKNOWN WHY  . IBS (irritable bowel syndrome)   . Mild sleep apnea    PER STUDY  11-30-2010--  NO CPAP  . Spinal stenosis     There are no active problems to display for this patient.   Past Surgical History:  Procedure Laterality Date  . APPENDECTOMY    . KNEE ARTHROSCOPY WITH LATERAL MENISECTOMY Right 04/12/2012   Procedure: RIGHT KNEE ARTHROSCOPY WITH PARTIAL MEDIAL AND LATERAL MENISECTOMY;  Surgeon: Magnus Sinning, MD;  Location: [facility];  Service: Orthopedics;  Laterality: Right;  . SHOULDER ARTHROSCOPY WITH ROTATOR CUFF REPAIR AND SUBACROMIAL DECOMPRESSION  12-21-1999  &  01-28-2004       Home Medications    Prior to Admission medications   Medication Sig Start Date End Date Taking? Authorizing Provider  ALPRAZolam Duanne Moron) 0.25 MG tablet Take 0.25 mg by mouth at bedtime as needed for sleep.    Historical Provider, MD  aspirin 81 MG tablet Take 81 mg by mouth daily.    Historical Provider, MD  celecoxib (CELEBREX) 200 MG capsule Take 200 mg by mouth daily.     Historical Provider, MD  Lactobacillus (ACIDOPHILUS PO) Take by mouth.    Historical Provider, MD  METHYLPHENIDATE HCL CD PO Take by mouth.    Historical Provider, MD  mometasone (NASONEX) 50 MCG/ACT nasal spray Place 1 spray into the nose daily.     Historical Provider, MD  Multiple  Vitamin (MULTIVITAMIN) tablet Take 1 tablet by mouth daily.    Historical Provider, MD  Multiple Vitamins-Minerals (ZINC PO) Take by mouth.    Historical Provider, MD  omeprazole (PRILOSEC) 20 MG capsule Take 20 mg by mouth every morning.     Historical Provider, MD  traMADol (ULTRAM) 50 MG tablet Take 1 tablet (50 mg total) by mouth every 6 (six) hours as needed. 05/25/16   Daleen Bo, MD  vitamin C (ASCORBIC ACID) 500 MG tablet Take 500 mg by mouth daily.    Historical Provider, MD    Family History Family History  Problem Relation Age of Onset  . Heart disease Mother   . Heart disease Father     Social History Social History  Substance Use Topics  . Smoking status: Former Smoker    Years: 17.00    Types: Cigarettes  . Smokeless tobacco: Never Used     Comment: QUIT SMOKING 40 YRS AGO  . Alcohol use Yes     Comment: ONE BOTTLE WINE PER DAY     Allergies   Patient has no known allergies.   Review of Systems Review of Systems  All other systems reviewed and are negative.    Physical Exam Updated Vital Signs BP 136/88 (BP Location: Left Arm)   Pulse 75   Temp  98 F (36.7 C) (Oral)   Resp 20   Ht 5' 9.5" (1.765 m)   Wt 154 lb (69.9 kg)   SpO2 94%   BMI 22.42 kg/m   Physical Exam  Constitutional: He is oriented to person, place, and time. He appears well-developed and well-nourished. No distress.  HENT:  Head: Normocephalic and atraumatic.  Right Ear: External ear normal.  Left Ear: External ear normal.  Active bleeding, with blood clot, left nares.  Blood in posterior oropharynx.  Eyes: Conjunctivae and EOM are normal. Pupils are equal, round, and reactive to light.  Neck: Normal range of motion and phonation normal. Neck supple.  Cardiovascular: Normal rate and regular rhythm.   Pulmonary/Chest: Effort normal. He exhibits no bony tenderness.  Abdominal: There is no tenderness.  Musculoskeletal: Normal range of motion.  Neurological: He is alert and  oriented to person, place, and time. No cranial nerve deficit or sensory deficit. He exhibits normal muscle tone. Coordination normal.  Skin: Skin is warm, dry and intact.  Psychiatric: He has a normal mood and affect. His behavior is normal. Judgment and thought content normal.  Nursing note and vitals reviewed.    ED Treatments / Results  Labs (all labs ordered are listed, but only abnormal results are displayed) Labs Reviewed - No data to display  EKG  EKG Interpretation None       Radiology No results found.  Procedures .Epistaxis Management Date/Time: 05/25/2016 11:06 PM Performed by: Daleen Bo Authorized by: Daleen Bo   Consent:    Consent obtained:  Verbal   Consent given by:  Patient   Risks discussed:  Bleeding and pain   Alternatives discussed:  No treatment Anesthesia (see MAR for exact dosages):    Anesthesia method: Afrin nasal spray. Procedure details:    Treatment site:  Unable to specify (Suctioning, with pressure, no source of anterior nasal bleeding)   Repair method: Rapid Rhino.   Treatment complexity:  Limited   Treatment episode: initial   Post-procedure details:    Assessment:  Bleeding decreased   Patient tolerance of procedure:  Tolerated well, no immediate complications Comments:     Rapid Rhino balloon inflated to 8 cc, with air    (including critical care time)  Medications Ordered in ED Medications  oxymetazoline (AFRIN) 0.05 % nasal spray 1 spray (1 spray Each Nare Given 05/25/16 2212)  silver nitrate applicators applicator 2 Stick (2 Sticks Topical Given 05/25/16 2230)  traMADol (ULTRAM) tablet 50 mg (50 mg Oral Given 05/25/16 2340)     Initial Impression / Assessment and Plan / ED Course  I have reviewed the triage vital signs and the nursing notes.  Pertinent labs & imaging results that were available during my care of the patient were reviewed by me and considered in my medical decision making (see chart for details).       Medications  oxymetazoline (AFRIN) 0.05 % nasal spray 1 spray (1 spray Each Nare Given 05/25/16 2212)  silver nitrate applicators applicator 2 Stick (2 Sticks Topical Given 05/25/16 2230)  traMADol (ULTRAM) tablet 50 mg (50 mg Oral Given 05/25/16 2340)    Patient Vitals for the past 24 hrs:  BP Temp Temp src Pulse Resp SpO2 Height Weight  05/25/16 2340 136/88 98 F (36.7 C) Oral 75 20 94 % - -  05/25/16 2206 (!) 143/76 - - (!) 116 - 96 % - -  05/25/16 2204 - - - - - - 5' 9.5" (1.765 m) 154 lb (69.9  kg)    11:57 PM Reevaluation with update and discussion. After initial assessment and treatment, an updated evaluation reveals bleeding improved now, patient has no further complaints, findings discussed and questions answered. Zeena Starkel L    Final Clinical Impressions(s) / ED Diagnoses   Final diagnoses:  Left-sided epistaxis   Bleeding left nares, source not clear.  Bleeding improved with nasal packing.  Doubt hemodynamic instability, or sinusitis.  Nursing Notes Reviewed/ Care Coordinated Applicable Imaging Reviewed Interpretation of Laboratory Data incorporated into ED treatment  The patient appears reasonably screened and/or stabilized for discharge and I doubt any other medical condition or other [facility] requiring further screening, evaluation, or treatment in the ED at this time prior to discharge.  Plan: Home Medications-continue usual; Home Treatments-rest, fluids; return here if the recommended treatment, does not improve the symptoms; Recommended follow up-PCP as needed.  Return here in 4 days for packing removal, sooner if needed.   New Prescriptions New Prescriptions   TRAMADOL (ULTRAM) 50 MG TABLET    Take 1 tablet (50 mg total) by mouth every 6 (six) hours as needed.     Daleen Bo, MD 05/26/16 0000

## 2016-05-25 NOTE — Discharge Instructions (Signed)
Nasal packing will need to be removed in 4 days.  Return here for that.  He can try using ice on the nose to help additional bleeding.  Return here if needed for problems prior to the scheduled return time.

## 2016-05-25 NOTE — ED Notes (Signed)
Bed: AY84 Expected date:  Expected time:  Means of arrival:  Comments: epistaxis

## 2016-05-25 NOTE — ED Notes (Signed)
Pt c/o of nose bleed, pt reports that he had a few short episodes early this week. Pt reports that since 8 pm tonight blood has been gushing. Pt reports taking baby ASA daily. Pt is alert and oriented x 4. Pt friend is at bedside.

## 2016-05-25 NOTE — ED Triage Notes (Signed)
Pt from home with complaints of epistaxis on and off for a few days with "little nose bleeds" pt states tonight around 2030 pt began having a severe nosebleed. Pt is currently holding pressure to his nose. Pt denies trauma and denies bloodthinner use except for a baby aspirin yesterday.

## 2016-05-29 ENCOUNTER — Encounter (HOSPITAL_COMMUNITY): Payer: Self-pay | Admitting: Emergency Medicine

## 2016-05-29 ENCOUNTER — Emergency Department (HOSPITAL_COMMUNITY)
Admission: EM | Admit: 2016-05-29 | Discharge: 2016-05-29 | Disposition: A | Discharge status: Home / Self Care | Payer: Medicare Other | Attending: Emergency Medicine | Admitting: Emergency Medicine

## 2016-05-29 DIAGNOSIS — Z87891 Personal history of nicotine dependence: Secondary | ICD-10-CM | POA: Insufficient documentation

## 2016-05-29 DIAGNOSIS — Z7982 Long term (current) use of aspirin: Secondary | ICD-10-CM | POA: Diagnosis not present

## 2016-05-29 DIAGNOSIS — Z79899 Other long term (current) drug therapy: Secondary | ICD-10-CM | POA: Diagnosis not present

## 2016-05-29 DIAGNOSIS — R040 Epistaxis: Secondary | ICD-10-CM | POA: Insufficient documentation

## 2016-05-29 DIAGNOSIS — Z4800 Encounter for change or removal of nonsurgical wound dressing: Secondary | ICD-10-CM | POA: Diagnosis not present

## 2016-05-29 NOTE — ED Triage Notes (Signed)
Pt here for followup and removal of "nose plug" post nosebleed/seen on Thursday.

## 2016-05-29 NOTE — ED Provider Notes (Signed)
[place] DEPT Provider Note   CSN: [id number] Arrival date & time: 05/29/16  1236     History   Chief Complaint Chief Complaint  Patient presents with  . Nosebleed Followup    HPI Jose [name] is a 81 y.o. male.  HPI Requests rhinorocket be removed from left nare. Placed on May 4th for significant nose bleed. Reports tolerating well. No other complaints at this time.    Past Medical History:  Diagnosis Date  . Acute meniscal tear of knee    RIGHT KNEE  . Arthritis   . Hives    PER PT UNKNOWN WHY  . IBS (irritable bowel syndrome)   . Mild sleep apnea    PER STUDY  11-30-2010--  NO CPAP  . Spinal stenosis     There are no active problems to display for this patient.   Past Surgical History:  Procedure Laterality Date  . APPENDECTOMY    . KNEE ARTHROSCOPY WITH LATERAL MENISECTOMY Right 04/12/2012   Procedure: RIGHT KNEE ARTHROSCOPY WITH PARTIAL MEDIAL AND LATERAL MENISECTOMY;  Surgeon: Magnus Sinning, MD;  Location: [facility];  Service: Orthopedics;  Laterality: Right;  . SHOULDER ARTHROSCOPY WITH ROTATOR CUFF REPAIR AND SUBACROMIAL DECOMPRESSION  12-21-1999  &  01-28-2004       Home Medications    Prior to Admission medications   Medication Sig Start Date End Date Taking? Authorizing Provider  ALPRAZolam Duanne Moron) 0.25 MG tablet Take 0.25 mg by mouth at bedtime as needed for sleep.    [provider]  aspirin 81 MG tablet Take 81 mg by mouth daily.    [provider]  celecoxib (CELEBREX) 200 MG capsule Take 200 mg by mouth daily.     [provider]  Lactobacillus (ACIDOPHILUS PO) Take by mouth.    [provider]  METHYLPHENIDATE HCL CD PO Take by mouth.    [provider]  mometasone (NASONEX) 50 MCG/ACT nasal spray Place 1 spray into the nose daily.     [provider]  Multiple Vitamin (MULTIVITAMIN) tablet Take 1 tablet by mouth daily.    [provider]    Multiple Vitamins-Minerals (ZINC PO) Take by mouth.    [provider]  omeprazole (PRILOSEC) 20 MG capsule Take 20 mg by mouth every morning.     [provider]  traMADol (ULTRAM) 50 MG tablet Take 1 tablet (50 mg total) by mouth every 6 (six) hours as needed. 05/25/16   Daleen Bo, MD  vitamin C (ASCORBIC ACID) 500 MG tablet Take 500 mg by mouth daily.    [provider]    Family History Family History  Problem Relation Age of Onset  . Heart disease Mother   . Heart disease Father     Social History Social History  Substance Use Topics  . Smoking status: Former Smoker    Years: 17.00    Types: Cigarettes  . Smokeless tobacco: Never Used     Comment: QUIT SMOKING 40 YRS AGO  . Alcohol use Yes     Comment: ONE BOTTLE WINE PER DAY     Allergies   Patient has no known allergies.   Review of Systems Review of Systems  All other systems reviewed and are negative.    Physical Exam Updated Vital Signs There were no vitals taken for this visit.  Physical Exam  Constitutional: He is oriented to person, place, and time. He appears well-developed and well-nourished.  HENT:  Head: Normocephalic.  Packing removed from left nare without difficulty. No subsequent bleeding. Posterior pharynx normal  Eyes: EOM are normal.  Neck: Normal range of motion.  Pulmonary/Chest: Effort normal.  Abdominal: He exhibits no distension.  Musculoskeletal: Normal range of motion.  Neurological: He is alert and oriented to person, place, and time.  Psychiatric: He has a normal mood and affect.  Nursing note and vitals reviewed.    ED Treatments / Results  Labs (all labs ordered are listed, but only abnormal results are displayed) Labs Reviewed - No data to display  EKG  EKG Interpretation None       Radiology No results found.  Procedures Procedures (including critical care time)  Medications Ordered in ED Medications - No data to  display   Initial Impression / Assessment and Plan / ED Course  I have reviewed the triage vital signs and the nursing notes.  Pertinent labs & imaging results that were available during my care of the patient were reviewed by me and considered in my medical decision making (see chart for details).     Packing removed. No bleeding  Final Clinical Impressions(s) / ED Diagnoses   Final diagnoses:  Left-sided epistaxis    New Prescriptions New Prescriptions   No medications on file     Jola Schmidt, MD 05/29/16 1330

## 2016-05-31 DIAGNOSIS — Z1389 Encounter for screening for other disorder: Secondary | ICD-10-CM | POA: Diagnosis not present

## 2016-05-31 DIAGNOSIS — Z6823 Body mass index (BMI) 23.0-23.9, adult: Secondary | ICD-10-CM | POA: Diagnosis not present

## 2016-05-31 DIAGNOSIS — R1084 Generalized abdominal pain: Secondary | ICD-10-CM | POA: Diagnosis not present

## 2016-05-31 DIAGNOSIS — K219 Gastro-esophageal reflux disease without esophagitis: Secondary | ICD-10-CM | POA: Diagnosis not present

## 2016-05-31 DIAGNOSIS — F418 Other specified anxiety disorders: Secondary | ICD-10-CM | POA: Diagnosis not present

## 2016-05-31 DIAGNOSIS — R5382 Chronic fatigue, unspecified: Secondary | ICD-10-CM | POA: Diagnosis not present

## 2016-05-31 DIAGNOSIS — M542 Cervicalgia: Secondary | ICD-10-CM | POA: Diagnosis not present

## 2016-08-07 DIAGNOSIS — Z85828 Personal history of other malignant neoplasm of skin: Secondary | ICD-10-CM | POA: Diagnosis not present

## 2016-08-07 DIAGNOSIS — C44319 Basal cell carcinoma of skin of other parts of face: Secondary | ICD-10-CM | POA: Diagnosis not present

## 2016-08-07 DIAGNOSIS — D485 Neoplasm of uncertain behavior of skin: Secondary | ICD-10-CM | POA: Diagnosis not present

## 2016-08-07 DIAGNOSIS — D2371 Other benign neoplasm of skin of right lower limb, including hip: Secondary | ICD-10-CM | POA: Diagnosis not present

## 2016-08-07 DIAGNOSIS — L821 Other seborrheic keratosis: Secondary | ICD-10-CM | POA: Diagnosis not present

## 2016-08-07 DIAGNOSIS — L570 Actinic keratosis: Secondary | ICD-10-CM | POA: Diagnosis not present

## 2016-08-07 DIAGNOSIS — L820 Inflamed seborrheic keratosis: Secondary | ICD-10-CM | POA: Diagnosis not present

## 2016-09-11 DIAGNOSIS — Z125 Encounter for screening for malignant neoplasm of prostate: Secondary | ICD-10-CM | POA: Diagnosis not present

## 2016-09-11 DIAGNOSIS — Z79899 Other long term (current) drug therapy: Secondary | ICD-10-CM | POA: Diagnosis not present

## 2016-09-13 DIAGNOSIS — G8929 Other chronic pain: Secondary | ICD-10-CM | POA: Diagnosis not present

## 2016-09-13 DIAGNOSIS — M25511 Pain in right shoulder: Secondary | ICD-10-CM | POA: Diagnosis not present

## 2016-09-18 DIAGNOSIS — Z23 Encounter for immunization: Secondary | ICD-10-CM | POA: Diagnosis not present

## 2016-09-18 DIAGNOSIS — Z79899 Other long term (current) drug therapy: Secondary | ICD-10-CM | POA: Diagnosis not present

## 2016-09-18 DIAGNOSIS — K219 Gastro-esophageal reflux disease without esophagitis: Secondary | ICD-10-CM | POA: Diagnosis not present

## 2016-09-18 DIAGNOSIS — Z0000 Encounter for general adult medical examination without abnormal findings: Secondary | ICD-10-CM | POA: Diagnosis not present

## 2016-09-18 DIAGNOSIS — J449 Chronic obstructive pulmonary disease, unspecified: Secondary | ICD-10-CM | POA: Diagnosis not present

## 2016-09-18 DIAGNOSIS — Z87891 Personal history of nicotine dependence: Secondary | ICD-10-CM | POA: Diagnosis not present

## 2016-09-18 DIAGNOSIS — R5382 Chronic fatigue, unspecified: Secondary | ICD-10-CM | POA: Diagnosis not present

## 2016-09-18 DIAGNOSIS — Z6822 Body mass index (BMI) 22.0-22.9, adult: Secondary | ICD-10-CM | POA: Diagnosis not present

## 2016-09-18 DIAGNOSIS — Z1389 Encounter for screening for other disorder: Secondary | ICD-10-CM | POA: Diagnosis not present

## 2016-09-18 DIAGNOSIS — F418 Other specified anxiety disorders: Secondary | ICD-10-CM | POA: Diagnosis not present

## 2016-09-19 DIAGNOSIS — Z1212 Encounter for screening for malignant neoplasm of rectum: Secondary | ICD-10-CM | POA: Diagnosis not present

## 2016-09-20 DIAGNOSIS — Z85828 Personal history of other malignant neoplasm of skin: Secondary | ICD-10-CM | POA: Diagnosis not present

## 2016-09-20 DIAGNOSIS — L570 Actinic keratosis: Secondary | ICD-10-CM | POA: Diagnosis not present

## 2016-09-20 DIAGNOSIS — C44319 Basal cell carcinoma of skin of other parts of face: Secondary | ICD-10-CM | POA: Diagnosis not present

## 2016-09-20 DIAGNOSIS — L820 Inflamed seborrheic keratosis: Secondary | ICD-10-CM | POA: Diagnosis not present

## 2016-09-27 DIAGNOSIS — H43813 Vitreous degeneration, bilateral: Secondary | ICD-10-CM | POA: Diagnosis not present

## 2016-09-27 DIAGNOSIS — H2513 Age-related nuclear cataract, bilateral: Secondary | ICD-10-CM | POA: Diagnosis not present

## 2016-09-27 DIAGNOSIS — D2311 Other benign neoplasm of skin of right eyelid, including canthus: Secondary | ICD-10-CM | POA: Diagnosis not present

## 2016-09-27 DIAGNOSIS — H5203 Hypermetropia, bilateral: Secondary | ICD-10-CM | POA: Diagnosis not present

## 2016-10-19 DIAGNOSIS — H2511 Age-related nuclear cataract, right eye: Secondary | ICD-10-CM | POA: Diagnosis not present

## 2016-10-19 DIAGNOSIS — H25811 Combined forms of age-related cataract, right eye: Secondary | ICD-10-CM | POA: Diagnosis not present

## 2016-10-19 DIAGNOSIS — H52221 Regular astigmatism, right eye: Secondary | ICD-10-CM | POA: Diagnosis not present

## 2016-10-19 DIAGNOSIS — D2311 Other benign neoplasm of skin of right eyelid, including canthus: Secondary | ICD-10-CM | POA: Diagnosis not present

## 2016-11-02 DIAGNOSIS — H2512 Age-related nuclear cataract, left eye: Secondary | ICD-10-CM | POA: Diagnosis not present

## 2016-11-02 DIAGNOSIS — H25812 Combined forms of age-related cataract, left eye: Secondary | ICD-10-CM | POA: Diagnosis not present

## 2016-11-02 DIAGNOSIS — H52222 Regular astigmatism, left eye: Secondary | ICD-10-CM | POA: Diagnosis not present

## 2016-11-02 DIAGNOSIS — H25012 Cortical age-related cataract, left eye: Secondary | ICD-10-CM | POA: Diagnosis not present

## 2016-11-07 DIAGNOSIS — Z23 Encounter for immunization: Secondary | ICD-10-CM | POA: Diagnosis not present

## 2017-01-25 DIAGNOSIS — M25511 Pain in right shoulder: Secondary | ICD-10-CM | POA: Diagnosis not present

## 2017-03-19 DIAGNOSIS — G4709 Other insomnia: Secondary | ICD-10-CM | POA: Diagnosis not present

## 2017-03-19 DIAGNOSIS — Z79899 Other long term (current) drug therapy: Secondary | ICD-10-CM | POA: Diagnosis not present

## 2017-03-19 DIAGNOSIS — R5382 Chronic fatigue, unspecified: Secondary | ICD-10-CM | POA: Diagnosis not present

## 2017-03-19 DIAGNOSIS — Z6822 Body mass index (BMI) 22.0-22.9, adult: Secondary | ICD-10-CM | POA: Diagnosis not present

## 2017-03-19 DIAGNOSIS — R3915 Urgency of urination: Secondary | ICD-10-CM | POA: Diagnosis not present

## 2017-03-21 DIAGNOSIS — Z85828 Personal history of other malignant neoplasm of skin: Secondary | ICD-10-CM | POA: Diagnosis not present

## 2017-03-21 DIAGNOSIS — L298 Other pruritus: Secondary | ICD-10-CM | POA: Diagnosis not present

## 2017-03-21 DIAGNOSIS — L820 Inflamed seborrheic keratosis: Secondary | ICD-10-CM | POA: Diagnosis not present

## 2017-03-21 DIAGNOSIS — L821 Other seborrheic keratosis: Secondary | ICD-10-CM | POA: Diagnosis not present

## 2017-03-21 DIAGNOSIS — D692 Other nonthrombocytopenic purpura: Secondary | ICD-10-CM | POA: Diagnosis not present

## 2017-03-21 DIAGNOSIS — L570 Actinic keratosis: Secondary | ICD-10-CM | POA: Diagnosis not present

## 2017-04-25 DIAGNOSIS — M545 Low back pain: Secondary | ICD-10-CM | POA: Diagnosis not present

## 2017-04-25 DIAGNOSIS — M25511 Pain in right shoulder: Secondary | ICD-10-CM | POA: Diagnosis not present

## 2017-05-18 DIAGNOSIS — M79674 Pain in right toe(s): Secondary | ICD-10-CM | POA: Diagnosis not present

## 2017-05-18 DIAGNOSIS — M25774 Osteophyte, right foot: Secondary | ICD-10-CM | POA: Diagnosis not present

## 2017-05-21 DIAGNOSIS — M5030 Other cervical disc degeneration, unspecified cervical region: Secondary | ICD-10-CM | POA: Diagnosis not present

## 2017-05-21 DIAGNOSIS — M5136 Other intervertebral disc degeneration, lumbar region: Secondary | ICD-10-CM | POA: Insufficient documentation

## 2017-06-09 DIAGNOSIS — M5136 Other intervertebral disc degeneration, lumbar region: Secondary | ICD-10-CM | POA: Diagnosis not present

## 2017-06-27 DIAGNOSIS — M47816 Spondylosis without myelopathy or radiculopathy, lumbar region: Secondary | ICD-10-CM | POA: Insufficient documentation

## 2017-07-19 DIAGNOSIS — M47816 Spondylosis without myelopathy or radiculopathy, lumbar region: Secondary | ICD-10-CM | POA: Diagnosis not present

## 2017-08-03 DIAGNOSIS — M47816 Spondylosis without myelopathy or radiculopathy, lumbar region: Secondary | ICD-10-CM | POA: Diagnosis not present

## 2017-08-21 DIAGNOSIS — M5136 Other intervertebral disc degeneration, lumbar region: Secondary | ICD-10-CM | POA: Diagnosis not present

## 2017-08-21 DIAGNOSIS — M47816 Spondylosis without myelopathy or radiculopathy, lumbar region: Secondary | ICD-10-CM | POA: Diagnosis not present

## 2017-09-12 DIAGNOSIS — Z79899 Other long term (current) drug therapy: Secondary | ICD-10-CM | POA: Diagnosis not present

## 2017-09-12 DIAGNOSIS — Z125 Encounter for screening for malignant neoplasm of prostate: Secondary | ICD-10-CM | POA: Diagnosis not present

## 2017-09-12 DIAGNOSIS — R82998 Other abnormal findings in urine: Secondary | ICD-10-CM | POA: Diagnosis not present

## 2017-09-19 DIAGNOSIS — J449 Chronic obstructive pulmonary disease, unspecified: Secondary | ICD-10-CM | POA: Diagnosis not present

## 2017-09-19 DIAGNOSIS — R21 Rash and other nonspecific skin eruption: Secondary | ICD-10-CM | POA: Diagnosis not present

## 2017-09-19 DIAGNOSIS — Z0000 Encounter for general adult medical examination without abnormal findings: Secondary | ICD-10-CM | POA: Diagnosis not present

## 2017-09-19 DIAGNOSIS — M545 Low back pain: Secondary | ICD-10-CM | POA: Diagnosis not present

## 2017-09-19 DIAGNOSIS — M5136 Other intervertebral disc degeneration, lumbar region: Secondary | ICD-10-CM | POA: Diagnosis not present

## 2017-09-19 DIAGNOSIS — R5382 Chronic fatigue, unspecified: Secondary | ICD-10-CM | POA: Diagnosis not present

## 2017-09-19 DIAGNOSIS — Z23 Encounter for immunization: Secondary | ICD-10-CM | POA: Diagnosis not present

## 2017-09-19 DIAGNOSIS — Z79899 Other long term (current) drug therapy: Secondary | ICD-10-CM | POA: Diagnosis not present

## 2017-09-19 DIAGNOSIS — F5221 Male erectile disorder: Secondary | ICD-10-CM | POA: Diagnosis not present

## 2017-09-19 DIAGNOSIS — G4709 Other insomnia: Secondary | ICD-10-CM | POA: Diagnosis not present

## 2017-09-19 DIAGNOSIS — M542 Cervicalgia: Secondary | ICD-10-CM | POA: Diagnosis not present

## 2017-09-19 DIAGNOSIS — Z1389 Encounter for screening for other disorder: Secondary | ICD-10-CM | POA: Diagnosis not present

## 2017-09-19 DIAGNOSIS — Z6823 Body mass index (BMI) 23.0-23.9, adult: Secondary | ICD-10-CM | POA: Diagnosis not present

## 2017-09-19 DIAGNOSIS — R3915 Urgency of urination: Secondary | ICD-10-CM | POA: Diagnosis not present

## 2017-09-25 DIAGNOSIS — Z1212 Encounter for screening for malignant neoplasm of rectum: Secondary | ICD-10-CM | POA: Diagnosis not present

## 2017-10-30 DIAGNOSIS — M25511 Pain in right shoulder: Secondary | ICD-10-CM | POA: Diagnosis not present

## 2017-10-30 DIAGNOSIS — M25529 Pain in unspecified elbow: Secondary | ICD-10-CM | POA: Diagnosis not present

## 2017-12-05 DIAGNOSIS — H524 Presbyopia: Secondary | ICD-10-CM | POA: Diagnosis not present

## 2017-12-05 DIAGNOSIS — H26493 Other secondary cataract, bilateral: Secondary | ICD-10-CM | POA: Diagnosis not present

## 2017-12-05 DIAGNOSIS — H43813 Vitreous degeneration, bilateral: Secondary | ICD-10-CM | POA: Diagnosis not present

## 2017-12-05 DIAGNOSIS — H04123 Dry eye syndrome of bilateral lacrimal glands: Secondary | ICD-10-CM | POA: Diagnosis not present

## 2018-01-31 DIAGNOSIS — M25521 Pain in right elbow: Secondary | ICD-10-CM | POA: Diagnosis not present

## 2018-01-31 DIAGNOSIS — M25511 Pain in right shoulder: Secondary | ICD-10-CM | POA: Diagnosis not present

## 2018-02-20 DIAGNOSIS — M25511 Pain in right shoulder: Secondary | ICD-10-CM | POA: Diagnosis not present

## 2018-03-27 DIAGNOSIS — R05 Cough: Secondary | ICD-10-CM | POA: Diagnosis not present

## 2018-03-27 DIAGNOSIS — F418 Other specified anxiety disorders: Secondary | ICD-10-CM | POA: Diagnosis not present

## 2018-03-27 DIAGNOSIS — Z79899 Other long term (current) drug therapy: Secondary | ICD-10-CM | POA: Diagnosis not present

## 2018-03-27 DIAGNOSIS — Z6822 Body mass index (BMI) 22.0-22.9, adult: Secondary | ICD-10-CM | POA: Diagnosis not present

## 2018-03-27 DIAGNOSIS — K219 Gastro-esophageal reflux disease without esophagitis: Secondary | ICD-10-CM | POA: Diagnosis not present

## 2018-03-27 DIAGNOSIS — R5382 Chronic fatigue, unspecified: Secondary | ICD-10-CM | POA: Diagnosis not present

## 2018-05-28 DIAGNOSIS — M25521 Pain in right elbow: Secondary | ICD-10-CM | POA: Diagnosis not present

## 2018-05-28 DIAGNOSIS — M25511 Pain in right shoulder: Secondary | ICD-10-CM | POA: Diagnosis not present

## 2018-06-27 DIAGNOSIS — M25511 Pain in right shoulder: Secondary | ICD-10-CM | POA: Diagnosis not present

## 2018-06-28 DIAGNOSIS — L570 Actinic keratosis: Secondary | ICD-10-CM | POA: Diagnosis not present

## 2018-06-28 DIAGNOSIS — D485 Neoplasm of uncertain behavior of skin: Secondary | ICD-10-CM | POA: Diagnosis not present

## 2018-06-28 DIAGNOSIS — L821 Other seborrheic keratosis: Secondary | ICD-10-CM | POA: Diagnosis not present

## 2018-06-28 DIAGNOSIS — Z85828 Personal history of other malignant neoplasm of skin: Secondary | ICD-10-CM | POA: Diagnosis not present

## 2018-06-28 DIAGNOSIS — L438 Other lichen planus: Secondary | ICD-10-CM | POA: Diagnosis not present

## 2018-06-28 DIAGNOSIS — L820 Inflamed seborrheic keratosis: Secondary | ICD-10-CM | POA: Diagnosis not present

## 2018-08-13 DIAGNOSIS — R233 Spontaneous ecchymoses: Secondary | ICD-10-CM | POA: Diagnosis not present

## 2018-08-13 DIAGNOSIS — Z85828 Personal history of other malignant neoplasm of skin: Secondary | ICD-10-CM | POA: Diagnosis not present

## 2018-08-13 DIAGNOSIS — D485 Neoplasm of uncertain behavior of skin: Secondary | ICD-10-CM | POA: Diagnosis not present

## 2018-08-29 ENCOUNTER — Emergency Department (HOSPITAL_COMMUNITY)
Admission: EM | Admit: 2018-08-29 | Discharge: 2018-08-29 | Disposition: A | Discharge status: Home / Self Care | Payer: Medicare Other | Attending: Emergency Medicine | Admitting: Emergency Medicine

## 2018-08-29 ENCOUNTER — Emergency Department (HOSPITAL_COMMUNITY): Payer: Medicare Other

## 2018-08-29 ENCOUNTER — Other Ambulatory Visit: Payer: Self-pay

## 2018-08-29 ENCOUNTER — Encounter (HOSPITAL_COMMUNITY): Payer: Self-pay | Admitting: Emergency Medicine

## 2018-08-29 DIAGNOSIS — Z87891 Personal history of nicotine dependence: Secondary | ICD-10-CM | POA: Insufficient documentation

## 2018-08-29 DIAGNOSIS — Y999 Unspecified external cause status: Secondary | ICD-10-CM | POA: Insufficient documentation

## 2018-08-29 DIAGNOSIS — Y93A1 Activity, exercise machines primarily for cardiorespiratory conditioning: Secondary | ICD-10-CM | POA: Insufficient documentation

## 2018-08-29 DIAGNOSIS — S01312A Laceration without foreign body of left ear, initial encounter: Secondary | ICD-10-CM | POA: Diagnosis not present

## 2018-08-29 DIAGNOSIS — S0993XA Unspecified injury of face, initial encounter: Secondary | ICD-10-CM | POA: Diagnosis present

## 2018-08-29 DIAGNOSIS — S80211A Abrasion, right knee, initial encounter: Secondary | ICD-10-CM | POA: Diagnosis not present

## 2018-08-29 DIAGNOSIS — Z79899 Other long term (current) drug therapy: Secondary | ICD-10-CM | POA: Insufficient documentation

## 2018-08-29 DIAGNOSIS — S299XXA Unspecified injury of thorax, initial encounter: Secondary | ICD-10-CM | POA: Diagnosis not present

## 2018-08-29 DIAGNOSIS — S80212A Abrasion, left knee, initial encounter: Secondary | ICD-10-CM | POA: Diagnosis not present

## 2018-08-29 DIAGNOSIS — W19XXXA Unspecified fall, initial encounter: Secondary | ICD-10-CM | POA: Diagnosis not present

## 2018-08-29 DIAGNOSIS — R000 Tachycardia, unspecified: Secondary | ICD-10-CM | POA: Diagnosis not present

## 2018-08-29 DIAGNOSIS — M542 Cervicalgia: Secondary | ICD-10-CM | POA: Diagnosis not present

## 2018-08-29 DIAGNOSIS — Z23 Encounter for immunization: Secondary | ICD-10-CM | POA: Diagnosis not present

## 2018-08-29 DIAGNOSIS — Z7982 Long term (current) use of aspirin: Secondary | ICD-10-CM | POA: Insufficient documentation

## 2018-08-29 DIAGNOSIS — R55 Syncope and collapse: Secondary | ICD-10-CM | POA: Diagnosis not present

## 2018-08-29 DIAGNOSIS — Y92009 Unspecified place in unspecified non-institutional (private) residence as the place of occurrence of the external cause: Secondary | ICD-10-CM | POA: Insufficient documentation

## 2018-08-29 LAB — CBC WITH DIFFERENTIAL/PLATELET
Abs Immature Granulocytes: 0.07 10*3/uL (ref 0.00–0.07)
Basophils Absolute: 0 10*3/uL (ref 0.0–0.1)
Basophils Relative: 0 %
Eosinophils Absolute: 0 10*3/uL (ref 0.0–0.5)
Eosinophils Relative: 0 %
HCT: 45 % (ref 39.0–52.0)
Hemoglobin: 14.7 g/dL (ref 13.0–17.0)
Immature Granulocytes: 1 %
Lymphocytes Relative: 6 %
Lymphs Abs: 0.6 10*3/uL — ABNORMAL LOW (ref 0.7–4.0)
MCH: 31.7 pg (ref 26.0–34.0)
MCHC: 32.7 g/dL (ref 30.0–36.0)
MCV: 97.2 fL (ref 80.0–100.0)
Monocytes Absolute: 0.7 10*3/uL (ref 0.1–1.0)
Monocytes Relative: 7 %
Neutro Abs: 8.8 10*3/uL — ABNORMAL HIGH (ref 1.7–7.7)
Neutrophils Relative %: 86 %
Platelets: 189 10*3/uL (ref 150–400)
RBC: 4.63 MIL/uL (ref 4.22–5.81)
RDW: 13.3 % (ref 11.5–15.5)
WBC: 10.1 10*3/uL (ref 4.0–10.5)
nRBC: 0 % (ref 0.0–0.2)

## 2018-08-29 LAB — TROPONIN I (HIGH SENSITIVITY): Troponin I (High Sensitivity): 17 ng/L (ref <18)

## 2018-08-29 LAB — ETHANOL: Alcohol, Ethyl (B): <10 mg/dL (ref <10)

## 2018-08-29 LAB — COMPREHENSIVE METABOLIC PANEL
ALT: 28 U/L (ref 0–44)
AST: 58 U/L — ABNORMAL HIGH (ref 15–41)
Albumin: 4.7 g/dL (ref 3.5–5.0)
Alkaline Phosphatase: 49 U/L (ref 38–126)
Anion gap: 13 (ref 5–15)
BUN: 24 mg/dL — ABNORMAL HIGH (ref 8–23)
CO2: 29 mmol/L (ref 22–32)
Calcium: 10.5 mg/dL — ABNORMAL HIGH (ref 8.9–10.3)
Chloride: 98 mmol/L (ref 98–111)
Creatinine, Ser: 0.89 mg/dL (ref 0.61–1.24)
GFR calc Af Amer: >60 mL/min (ref >60)
GFR calc non Af Amer: >60 mL/min (ref >60)
Glucose, Bld: 149 mg/dL — ABNORMAL HIGH (ref 70–99)
Potassium: 4.2 mmol/L (ref 3.5–5.1)
Sodium: 140 mmol/L (ref 135–145)
Total Bilirubin: 0.8 mg/dL (ref 0.3–1.2)
Total Protein: 8 g/dL (ref 6.5–8.1)

## 2018-08-29 MED ORDER — TETANUS-DIPHTH-ACELL PERTUSSIS 5-2.5-18.5 LF-MCG/0.5 IM SUSP
0.5000 mL | Freq: Once | INTRAMUSCULAR | Status: AC
  Administered 2018-08-29: 0.5 mL via INTRAMUSCULAR
  Filled 2018-08-29: qty 0.5

## 2018-08-29 MED ORDER — BACITRACIN ZINC 500 UNIT/GM EX OINT
TOPICAL_OINTMENT | CUTANEOUS | Status: AC
  Administered 2018-08-29: 23:00:00
  Filled 2018-08-29: qty 1.8

## 2018-08-29 MED ORDER — LIDOCAINE HCL (PF) 1 % IJ SOLN
20.0000 mL | Freq: Once | INTRAMUSCULAR | Status: AC
  Administered 2018-08-29: 20 mL via INTRADERMAL
  Filled 2018-08-29: qty 30

## 2018-08-29 NOTE — Discharge Instructions (Signed)
Please call your primary care doctor to schedule follow-up appointment for wound recheck and suture removal in 1 week.  If he develop any significant swelling, redness, pus drainage, please return to the emergency department for reassessment as this could be a sign of infection.  Please keep the area clean and dry, recommend applying dressing as discussed, using antibiotic ointment or similar.  If you develop chest pain, difficulty breathing, episodes of passing out or other new concerning symptoms recommend returning here for reassessment.

## 2018-08-29 NOTE — ED Provider Notes (Signed)
MSE was initiated and I personally evaluated the patient and placed orders (if any) at  3:58 PM on August 29, 2018.  The patient appears stable so that the remainder of the MSE may be completed by another provider.    Chief Complaint: possible syncope, altered mental status  HPI:   83 year old male presents to ED for possible syncopal episode.  Majority of history of present provided by friend at bedside.  Episode was unwitnessed.  He believes that he fell off of a treadmill.  Patient believes that he tripped but no real recollection so unsure if he syncopized and then fell.  His friend found him hunched over on the table, conscious.  States that he has been with him for the past 2 hours and he has been slowly "starting to remember things here and there."  States that at baseline he is alert and oriented x4, exercises regularly.  He admits to daily alcohol use, 1 bottle of wine daily.  Last intake was last night.  Reports neck pain, pain at the site of laceration near the left ear.  Unknown last tetanus.  ROS: Neck pain, ear pain  Physical Exam:   Gen: No distress  Neuro: Awake and Alert  Skin: Warm    Focused Exam: Laceration noted near left tragus.  Alert and oriented x4.  No gross facial asymmetry noted.  Equal grip strength bilaterally.  Tenderness palpation of the C-spine at the midline.  Skin abrasions noted to bilateral knee.  Strength 5/5 in bilateral lower extremities.   Initiation of care has begun. The patient has been counseled on the process, plan, and necessity for staying for the completion/evaluation, and the remainder of the medical screening examination    Delia Heady, PA-C 08/29/18 [place], [place], DO 08/29/18 2201

## 2018-08-29 NOTE — ED Notes (Signed)
ED Provider at bedside. 

## 2018-08-29 NOTE — ED Provider Notes (Signed)
[facility] DEPT Provider Note   CSN: [id number] Arrival date & time: 08/29/18  1527     History   Chief Complaint Chief Complaint  Patient presents with  . Head Injury  . Laceration  . Altered Mental Status    HPI Jose [name] is a 83 y.o. male.  Presents emergency department with a chief complaint of fall, possible syncopal episode while on treadmill.  Patient believes he tripped, but is not entirely sure that he did not for syncope is.  Landed on the left side of his face, also hit knees.  Patient's only complaint is some mild to moderate neck pain, states it occurred after the fall.  Denies any associated numbness, weakness in his arms or legs.  Has been able to ambulate without difficulty since the accident.  Patient initially had some confusion and was unsure exactly what happened.  Patient states that he feels close to his baseline mental self.  No vision changes, no hearing loss.  His personal trainer was nearby when it happened but did not directly witness the event.  States he saw him almost immediately after it happened and reports that he was conscious but confused and had a hard time recalling the events initially but this is significantly improved.  Patient denies any major medical problems, did admit to daily alcohol use.     HPI  Past Medical History:  Diagnosis Date  . Acute meniscal tear of knee    RIGHT KNEE  . Arthritis   . Hives    PER PT UNKNOWN WHY  . IBS (irritable bowel syndrome)   . Mild sleep apnea    PER STUDY  11-30-2010--  NO CPAP  . Spinal stenosis     There are no active problems to display for this patient.   Past Surgical History:  Procedure Laterality Date  . APPENDECTOMY    . KNEE ARTHROSCOPY WITH LATERAL MENISECTOMY Right 04/12/2012   Procedure: RIGHT KNEE ARTHROSCOPY WITH PARTIAL MEDIAL AND LATERAL MENISECTOMY;  Surgeon: Magnus Sinning, MD;  Location: [facility];  Service:  Orthopedics;  Laterality: Right;  . SHOULDER ARTHROSCOPY WITH ROTATOR CUFF REPAIR AND SUBACROMIAL DECOMPRESSION  12-21-1999  &  01-28-2004        Home Medications    Prior to Admission medications   Medication Sig Start Date End Date Taking? Authorizing Provider  ALPRAZolam Duanne Moron) 0.25 MG tablet Take 0.25 mg by mouth at bedtime as needed for sleep.    [provider]  aspirin 81 MG tablet Take 81 mg by mouth daily.    [provider]  celecoxib (CELEBREX) 200 MG capsule Take 200 mg by mouth daily.     [provider]  Lactobacillus (ACIDOPHILUS PO) Take by mouth.    [provider]  METHYLPHENIDATE HCL CD PO Take by mouth.    [provider]  mometasone (NASONEX) 50 MCG/ACT nasal spray Place 1 spray into the nose daily.     [provider]  Multiple Vitamin (MULTIVITAMIN) tablet Take 1 tablet by mouth daily.    [provider]  Multiple Vitamins-Minerals (ZINC PO) Take by mouth.    [provider]  omeprazole (PRILOSEC) 20 MG capsule Take 20 mg by mouth every morning.     [provider]  traMADol (ULTRAM) 50 MG tablet Take 1 tablet (50 mg total) by mouth every 6 (six) hours as needed. 05/25/16   Daleen Bo, MD  vitamin C (ASCORBIC ACID) 500 MG  tablet Take 500 mg by mouth daily.    [provider]    Family History Family History  Problem Relation Age of Onset  . Heart disease Mother   . Heart disease Father     Social History Social History   Tobacco Use  . Smoking status: Former Smoker    Years: 17.00    Types: Cigarettes  . Smokeless tobacco: Never Used  . Tobacco comment: QUIT SMOKING 40 YRS AGO  Substance Use Topics  . Alcohol use: Yes    Comment: ONE BOTTLE WINE PER DAY  . Drug use: No     Allergies   Patient has no known allergies.   Review of Systems Review of Systems  Constitutional: Negative for chills and fever.  HENT: Positive for ear pain. Negative for sore  throat.   Eyes: Negative for pain and visual disturbance.  Respiratory: Negative for cough and shortness of breath.   Cardiovascular: Negative for chest pain and palpitations.  Gastrointestinal: Negative for abdominal pain and vomiting.  Genitourinary: Negative for dysuria and hematuria.  Musculoskeletal: Positive for neck pain. Negative for arthralgias and back pain.  Skin: Negative for color change and rash.  Neurological: Negative for seizures and syncope.  All other systems reviewed and are negative.    Physical Exam Updated Vital Signs BP (!) 182/106   Pulse (!) 103   Temp 97.9 F (36.6 C) (Oral)   Resp 20   SpO2 98%   Physical Exam Vitals signs and nursing note reviewed.  Constitutional:      Appearance: He is well-developed.  HENT:     Ears:      Comments: 3 cm laceration, vertical just medial to tragus, no active bleeding noted, TM clear; 1cm laceration over antihelix to level of cartilage, does not penetrate cartilage    Nose:     Comments: No trauma to nose, no nasal septal hematoma    Mouth/Throat:     Comments: No trauma to mouth Eyes:     Conjunctiva/sclera: Conjunctivae normal.  Neck:     Musculoskeletal: Neck supple.  Cardiovascular:     Rate and Rhythm: Normal rate and regular rhythm.     Heart sounds: No murmur.  Pulmonary:     Effort: Pulmonary effort is normal. No respiratory distress.     Breath sounds: Normal breath sounds.  Abdominal:     Palpations: Abdomen is soft.     Tenderness: There is no abdominal tenderness.  Skin:    General: Skin is warm and dry.     Capillary Refill: Capillary refill takes less than 2 seconds.  Neurological:     General: No focal deficit present.     Mental Status: He is alert and oriented to person, place, and time. Mental status is at baseline.      ED Treatments / Results  Labs (all labs ordered are listed, but only abnormal results are displayed) Labs Reviewed  COMPREHENSIVE METABOLIC PANEL  CBC WITH  DIFFERENTIAL/PLATELET  ETHANOL  TROPONIN I (HIGH SENSITIVITY)    EKG None  Radiology No results found.  Procedures .Marland KitchenLaceration Repair  Date/Time: 08/29/2018 11:27 PM Performed by: Lucrezia Starch, MD Authorized by: Lucrezia Starch, MD   Consent:    Consent obtained:  Verbal   Consent given by:  Patient   Risks discussed:  Infection, pain, vascular damage, poor wound healing, poor cosmetic result and need for additional repair   Alternatives discussed:  No treatment, delayed treatment, observation and referral Anesthesia (see  MAR for exact dosages):    Anesthesia method:  Local infiltration   Local anesthetic:  Lidocaine 1% w/o epi Laceration details:    Location:  Ear   Ear location:  L ear   Length (cm):  4 Repair type:    Repair type:  Complex Treatment:    Area cleansed with:  Betadine   Amount of cleaning:  Extensive   Irrigation solution:  Sterile saline   Irrigation method:  Syringe   Debridement:  None Skin repair:    Repair method:  Sutures   Suture size:  5-0   Suture material:  Nylon   Number of sutures:  7 Approximation:    Approximation:  Close   (including critical care time)  Medications Ordered in ED Medications  Tdap (BOOSTRIX) injection 0.5 mL (has no administration in time range)    Initial Impression / Assessment and Plan / ED Course  I have reviewed the triage vital signs and the nursing notes.  Pertinent labs & imaging results that were available during my care of the patient were reviewed by me and considered in my medical decision making (see chart for details).  Clinical Course as of Aug 28 2324  Thu Aug 29, 2018  2020 Discussed case with ENT on call who recommends primary closure, reaaproximation of cartilage and skin   [RD]    Clinical Course User Index [RD] Lucrezia Starch, MD       83 year old gentleman presented to the emergency department after a fall on treadmill.  CT head, C-spine negative for acute  intracranial abnormality, acute fracture.  Patient had complex laceration to the left ear.  I discussed repair with ENT on-call given laceration went to level of cartilage.  I performed extensive wound irrigation, and primary closure of the 2 lacerations.  Had good approximation.  Reviewed wound care, return precautions and recommended follow-up with primary doctor for suture removal.  Final Clinical Impressions(s) / ED Diagnoses   Final diagnoses:  Complex laceration of left ear, initial encounter  Fall from standing, initial encounter    ED Discharge Orders    None       Lucrezia Starch, MD 08/29/18 [phone]

## 2018-08-29 NOTE — ED Triage Notes (Addendum)
Patient arrives POV with friend. Patient confused with laceration to left ear. Patient unable to state what happened. States "I don't remember." Denies LOC. A&Ox3.  Friend reports patient was on the tread mill at his home gym. States he did not see patient fall. Friend reports at baseline patient lives alone and can answer all questions.

## 2018-09-04 DIAGNOSIS — M5136 Other intervertebral disc degeneration, lumbar region: Secondary | ICD-10-CM | POA: Diagnosis not present

## 2018-09-04 DIAGNOSIS — M546 Pain in thoracic spine: Secondary | ICD-10-CM | POA: Diagnosis not present

## 2018-09-04 DIAGNOSIS — M47896 Other spondylosis, lumbar region: Secondary | ICD-10-CM | POA: Diagnosis not present

## 2018-09-05 DIAGNOSIS — S01312A Laceration without foreign body of left ear, initial encounter: Secondary | ICD-10-CM | POA: Diagnosis not present

## 2018-09-05 DIAGNOSIS — S0081XA Abrasion of other part of head, initial encounter: Secondary | ICD-10-CM | POA: Diagnosis not present

## 2018-09-05 DIAGNOSIS — Z4802 Encounter for removal of sutures: Secondary | ICD-10-CM | POA: Diagnosis not present

## 2018-09-12 DIAGNOSIS — M546 Pain in thoracic spine: Secondary | ICD-10-CM | POA: Diagnosis not present

## 2018-09-13 ENCOUNTER — Other Ambulatory Visit: Payer: Self-pay | Admitting: Orthopedic Surgery

## 2018-09-13 ENCOUNTER — Other Ambulatory Visit: Payer: Medicare Other

## 2018-09-13 DIAGNOSIS — M546 Pain in thoracic spine: Secondary | ICD-10-CM | POA: Insufficient documentation

## 2018-09-13 DIAGNOSIS — M5030 Other cervical disc degeneration, unspecified cervical region: Secondary | ICD-10-CM

## 2018-09-16 ENCOUNTER — Ambulatory Visit
Admission: RE | Admit: 2018-09-16 | Discharge: 2018-09-16 | Disposition: A | Discharge status: Home / Self Care | Payer: Medicare Other | Source: Ambulatory Visit | Attending: Orthopedic Surgery | Admitting: Orthopedic Surgery

## 2018-09-16 ENCOUNTER — Other Ambulatory Visit: Payer: Self-pay

## 2018-09-16 DIAGNOSIS — S12601A Unspecified nondisplaced fracture of seventh cervical vertebra, initial encounter for closed fracture: Secondary | ICD-10-CM | POA: Diagnosis not present

## 2018-09-16 DIAGNOSIS — M5030 Other cervical disc degeneration, unspecified cervical region: Secondary | ICD-10-CM

## 2018-09-20 DIAGNOSIS — M5030 Other cervical disc degeneration, unspecified cervical region: Secondary | ICD-10-CM | POA: Diagnosis not present

## 2018-09-20 DIAGNOSIS — M5136 Other intervertebral disc degeneration, lumbar region: Secondary | ICD-10-CM | POA: Diagnosis not present

## 2018-09-20 DIAGNOSIS — S129XXA Fracture of neck, unspecified, initial encounter: Secondary | ICD-10-CM | POA: Diagnosis not present

## 2018-09-25 DIAGNOSIS — R7989 Other specified abnormal findings of blood chemistry: Secondary | ICD-10-CM | POA: Diagnosis not present

## 2018-09-25 DIAGNOSIS — Z125 Encounter for screening for malignant neoplasm of prostate: Secondary | ICD-10-CM | POA: Diagnosis not present

## 2018-10-01 DIAGNOSIS — R82998 Other abnormal findings in urine: Secondary | ICD-10-CM | POA: Diagnosis not present

## 2018-10-02 DIAGNOSIS — Z1331 Encounter for screening for depression: Secondary | ICD-10-CM | POA: Diagnosis not present

## 2018-10-02 DIAGNOSIS — R3915 Urgency of urination: Secondary | ICD-10-CM | POA: Diagnosis not present

## 2018-10-02 DIAGNOSIS — F419 Anxiety disorder, unspecified: Secondary | ICD-10-CM | POA: Diagnosis not present

## 2018-10-02 DIAGNOSIS — F5221 Male erectile disorder: Secondary | ICD-10-CM | POA: Diagnosis not present

## 2018-10-02 DIAGNOSIS — Z0000 Encounter for general adult medical examination without abnormal findings: Secondary | ICD-10-CM | POA: Diagnosis not present

## 2018-10-02 DIAGNOSIS — K219 Gastro-esophageal reflux disease without esophagitis: Secondary | ICD-10-CM | POA: Diagnosis not present

## 2018-10-02 DIAGNOSIS — R5382 Chronic fatigue, unspecified: Secondary | ICD-10-CM | POA: Diagnosis not present

## 2018-10-02 DIAGNOSIS — J449 Chronic obstructive pulmonary disease, unspecified: Secondary | ICD-10-CM | POA: Diagnosis not present

## 2018-10-02 DIAGNOSIS — R413 Other amnesia: Secondary | ICD-10-CM | POA: Diagnosis not present

## 2018-10-02 DIAGNOSIS — R2689 Other abnormalities of gait and mobility: Secondary | ICD-10-CM | POA: Diagnosis not present

## 2018-10-02 DIAGNOSIS — G4700 Insomnia, unspecified: Secondary | ICD-10-CM | POA: Diagnosis not present

## 2018-10-02 DIAGNOSIS — Z8781 Personal history of (healed) traumatic fracture: Secondary | ICD-10-CM | POA: Diagnosis not present

## 2018-10-14 DIAGNOSIS — Z23 Encounter for immunization: Secondary | ICD-10-CM | POA: Diagnosis not present

## 2018-10-18 DIAGNOSIS — M542 Cervicalgia: Secondary | ICD-10-CM | POA: Diagnosis not present

## 2018-10-18 DIAGNOSIS — S129XXD Fracture of neck, unspecified, subsequent encounter: Secondary | ICD-10-CM | POA: Diagnosis not present

## 2018-10-23 ENCOUNTER — Other Ambulatory Visit: Payer: Self-pay | Admitting: Orthopedic Surgery

## 2018-10-23 DIAGNOSIS — M542 Cervicalgia: Secondary | ICD-10-CM

## 2018-10-24 DIAGNOSIS — M25511 Pain in right shoulder: Secondary | ICD-10-CM | POA: Diagnosis not present

## 2018-10-28 ENCOUNTER — Telehealth (INDEPENDENT_AMBULATORY_CARE_PROVIDER_SITE_OTHER): Payer: Self-pay

## 2018-10-28 NOTE — Telephone Encounter (Signed)
As discussed, please let him know that we are not accepting new patients.

## 2018-10-28 NOTE — Telephone Encounter (Signed)
I called and gave him the message; he insist I put him on top of waiting list. I told him I will note this ; but unfortunately you would not be willing to accept him at this time.

## 2018-11-14 DIAGNOSIS — M79609 Pain in unspecified limb: Secondary | ICD-10-CM | POA: Diagnosis not present

## 2018-11-14 DIAGNOSIS — M542 Cervicalgia: Secondary | ICD-10-CM | POA: Diagnosis not present

## 2018-11-14 DIAGNOSIS — F419 Anxiety disorder, unspecified: Secondary | ICD-10-CM | POA: Diagnosis not present

## 2018-11-14 DIAGNOSIS — R5383 Other fatigue: Secondary | ICD-10-CM | POA: Diagnosis not present

## 2018-11-25 ENCOUNTER — Other Ambulatory Visit: Payer: Self-pay

## 2018-11-25 ENCOUNTER — Ambulatory Visit
Admission: RE | Admit: 2018-11-25 | Discharge: 2018-11-25 | Disposition: A | Discharge status: Home / Self Care | Payer: Medicare Other | Source: Ambulatory Visit | Attending: Orthopedic Surgery | Admitting: Orthopedic Surgery

## 2018-11-25 DIAGNOSIS — S12600D Unspecified displaced fracture of seventh cervical vertebra, subsequent encounter for fracture with routine healing: Secondary | ICD-10-CM | POA: Diagnosis not present

## 2018-11-25 DIAGNOSIS — M542 Cervicalgia: Secondary | ICD-10-CM

## 2018-12-02 DIAGNOSIS — M542 Cervicalgia: Secondary | ICD-10-CM | POA: Diagnosis not present

## 2018-12-09 DIAGNOSIS — H5211 Myopia, right eye: Secondary | ICD-10-CM | POA: Diagnosis not present

## 2018-12-09 DIAGNOSIS — H26493 Other secondary cataract, bilateral: Secondary | ICD-10-CM | POA: Diagnosis not present

## 2018-12-09 DIAGNOSIS — H524 Presbyopia: Secondary | ICD-10-CM | POA: Diagnosis not present

## 2018-12-09 DIAGNOSIS — Z961 Presence of intraocular lens: Secondary | ICD-10-CM | POA: Diagnosis not present

## 2018-12-12 DIAGNOSIS — R5383 Other fatigue: Secondary | ICD-10-CM | POA: Diagnosis not present

## 2018-12-12 DIAGNOSIS — R1013 Epigastric pain: Secondary | ICD-10-CM | POA: Diagnosis not present

## 2018-12-12 DIAGNOSIS — J449 Chronic obstructive pulmonary disease, unspecified: Secondary | ICD-10-CM | POA: Diagnosis not present

## 2018-12-12 DIAGNOSIS — F419 Anxiety disorder, unspecified: Secondary | ICD-10-CM | POA: Diagnosis not present

## 2018-12-13 DIAGNOSIS — M542 Cervicalgia: Secondary | ICD-10-CM | POA: Diagnosis not present

## 2018-12-16 DIAGNOSIS — M542 Cervicalgia: Secondary | ICD-10-CM | POA: Diagnosis not present

## 2018-12-23 DIAGNOSIS — M542 Cervicalgia: Secondary | ICD-10-CM | POA: Diagnosis not present

## 2018-12-27 DIAGNOSIS — M542 Cervicalgia: Secondary | ICD-10-CM | POA: Diagnosis not present

## 2019-02-03 DIAGNOSIS — Z79899 Other long term (current) drug therapy: Secondary | ICD-10-CM | POA: Diagnosis not present

## 2019-02-03 DIAGNOSIS — F419 Anxiety disorder, unspecified: Secondary | ICD-10-CM | POA: Diagnosis not present

## 2019-02-03 DIAGNOSIS — J449 Chronic obstructive pulmonary disease, unspecified: Secondary | ICD-10-CM | POA: Diagnosis not present

## 2019-02-03 DIAGNOSIS — R0602 Shortness of breath: Secondary | ICD-10-CM | POA: Diagnosis not present

## 2019-03-05 DIAGNOSIS — J449 Chronic obstructive pulmonary disease, unspecified: Secondary | ICD-10-CM | POA: Diagnosis not present

## 2019-03-05 DIAGNOSIS — F419 Anxiety disorder, unspecified: Secondary | ICD-10-CM | POA: Diagnosis not present

## 2019-03-05 DIAGNOSIS — Z79899 Other long term (current) drug therapy: Secondary | ICD-10-CM | POA: Diagnosis not present

## 2019-03-05 DIAGNOSIS — R5383 Other fatigue: Secondary | ICD-10-CM | POA: Diagnosis not present

## 2019-04-02 DIAGNOSIS — Z79899 Other long term (current) drug therapy: Secondary | ICD-10-CM | POA: Diagnosis not present

## 2019-04-02 DIAGNOSIS — J449 Chronic obstructive pulmonary disease, unspecified: Secondary | ICD-10-CM | POA: Diagnosis not present

## 2019-04-02 DIAGNOSIS — R5383 Other fatigue: Secondary | ICD-10-CM | POA: Diagnosis not present

## 2019-04-02 DIAGNOSIS — F419 Anxiety disorder, unspecified: Secondary | ICD-10-CM | POA: Diagnosis not present

## 2019-04-30 DIAGNOSIS — R5383 Other fatigue: Secondary | ICD-10-CM | POA: Diagnosis not present

## 2019-04-30 DIAGNOSIS — M2550 Pain in unspecified joint: Secondary | ICD-10-CM | POA: Diagnosis not present

## 2019-04-30 DIAGNOSIS — F419 Anxiety disorder, unspecified: Secondary | ICD-10-CM | POA: Diagnosis not present

## 2019-04-30 DIAGNOSIS — Z79899 Other long term (current) drug therapy: Secondary | ICD-10-CM | POA: Diagnosis not present

## 2019-05-01 DIAGNOSIS — M25521 Pain in right elbow: Secondary | ICD-10-CM | POA: Diagnosis not present

## 2019-05-01 DIAGNOSIS — M25511 Pain in right shoulder: Secondary | ICD-10-CM | POA: Diagnosis not present

## 2019-05-14 DIAGNOSIS — M25511 Pain in right shoulder: Secondary | ICD-10-CM | POA: Diagnosis not present

## 2019-05-14 DIAGNOSIS — M25521 Pain in right elbow: Secondary | ICD-10-CM | POA: Diagnosis not present

## 2019-05-21 DIAGNOSIS — M19011 Primary osteoarthritis, right shoulder: Secondary | ICD-10-CM | POA: Diagnosis not present

## 2019-05-21 DIAGNOSIS — M25511 Pain in right shoulder: Secondary | ICD-10-CM | POA: Diagnosis not present

## 2019-05-21 DIAGNOSIS — M19021 Primary osteoarthritis, right elbow: Secondary | ICD-10-CM | POA: Diagnosis not present

## 2019-05-21 DIAGNOSIS — M25521 Pain in right elbow: Secondary | ICD-10-CM | POA: Diagnosis not present

## 2019-05-28 DIAGNOSIS — F419 Anxiety disorder, unspecified: Secondary | ICD-10-CM | POA: Diagnosis not present

## 2019-05-28 DIAGNOSIS — Z79899 Other long term (current) drug therapy: Secondary | ICD-10-CM | POA: Diagnosis not present

## 2019-05-28 DIAGNOSIS — J449 Chronic obstructive pulmonary disease, unspecified: Secondary | ICD-10-CM | POA: Diagnosis not present

## 2019-05-28 DIAGNOSIS — R5383 Other fatigue: Secondary | ICD-10-CM | POA: Diagnosis not present

## 2019-06-26 DIAGNOSIS — J449 Chronic obstructive pulmonary disease, unspecified: Secondary | ICD-10-CM | POA: Diagnosis not present

## 2019-06-26 DIAGNOSIS — M2550 Pain in unspecified joint: Secondary | ICD-10-CM | POA: Diagnosis not present

## 2019-06-26 DIAGNOSIS — Z79899 Other long term (current) drug therapy: Secondary | ICD-10-CM | POA: Diagnosis not present

## 2019-06-26 DIAGNOSIS — R5383 Other fatigue: Secondary | ICD-10-CM | POA: Diagnosis not present

## 2019-08-04 DIAGNOSIS — M545 Low back pain: Secondary | ICD-10-CM | POA: Diagnosis not present

## 2019-08-04 DIAGNOSIS — R5383 Other fatigue: Secondary | ICD-10-CM | POA: Diagnosis not present

## 2019-08-04 DIAGNOSIS — Z0001 Encounter for general adult medical examination with abnormal findings: Secondary | ICD-10-CM | POA: Diagnosis not present

## 2019-08-04 DIAGNOSIS — R0602 Shortness of breath: Secondary | ICD-10-CM | POA: Diagnosis not present

## 2019-08-04 DIAGNOSIS — G609 Hereditary and idiopathic neuropathy, unspecified: Secondary | ICD-10-CM | POA: Diagnosis not present

## 2019-08-04 DIAGNOSIS — J449 Chronic obstructive pulmonary disease, unspecified: Secondary | ICD-10-CM | POA: Diagnosis not present

## 2019-08-04 DIAGNOSIS — R1013 Epigastric pain: Secondary | ICD-10-CM | POA: Diagnosis not present

## 2019-08-04 DIAGNOSIS — N529 Male erectile dysfunction, unspecified: Secondary | ICD-10-CM | POA: Diagnosis not present

## 2019-08-04 DIAGNOSIS — Z125 Encounter for screening for malignant neoplasm of prostate: Secondary | ICD-10-CM | POA: Diagnosis not present

## 2019-08-04 DIAGNOSIS — M2550 Pain in unspecified joint: Secondary | ICD-10-CM | POA: Diagnosis not present

## 2019-08-04 DIAGNOSIS — R300 Dysuria: Secondary | ICD-10-CM | POA: Diagnosis not present

## 2019-08-04 DIAGNOSIS — E559 Vitamin D deficiency, unspecified: Secondary | ICD-10-CM | POA: Diagnosis not present

## 2019-08-18 DIAGNOSIS — M5030 Other cervical disc degeneration, unspecified cervical region: Secondary | ICD-10-CM | POA: Diagnosis not present

## 2019-08-20 DIAGNOSIS — M25511 Pain in right shoulder: Secondary | ICD-10-CM | POA: Diagnosis not present

## 2019-08-22 DIAGNOSIS — F419 Anxiety disorder, unspecified: Secondary | ICD-10-CM | POA: Diagnosis not present

## 2019-08-22 DIAGNOSIS — R5383 Other fatigue: Secondary | ICD-10-CM | POA: Diagnosis not present

## 2019-08-22 DIAGNOSIS — Z79899 Other long term (current) drug therapy: Secondary | ICD-10-CM | POA: Diagnosis not present

## 2019-08-22 DIAGNOSIS — R5382 Chronic fatigue, unspecified: Secondary | ICD-10-CM | POA: Diagnosis not present

## 2019-08-27 DIAGNOSIS — L821 Other seborrheic keratosis: Secondary | ICD-10-CM | POA: Diagnosis not present

## 2019-08-27 DIAGNOSIS — L905 Scar conditions and fibrosis of skin: Secondary | ICD-10-CM | POA: Diagnosis not present

## 2019-08-27 DIAGNOSIS — L570 Actinic keratosis: Secondary | ICD-10-CM | POA: Diagnosis not present

## 2019-08-27 DIAGNOSIS — L298 Other pruritus: Secondary | ICD-10-CM | POA: Diagnosis not present

## 2019-08-27 DIAGNOSIS — Z85828 Personal history of other malignant neoplasm of skin: Secondary | ICD-10-CM | POA: Diagnosis not present

## 2019-08-27 DIAGNOSIS — D485 Neoplasm of uncertain behavior of skin: Secondary | ICD-10-CM | POA: Diagnosis not present

## 2019-08-27 DIAGNOSIS — L814 Other melanin hyperpigmentation: Secondary | ICD-10-CM | POA: Diagnosis not present

## 2019-08-27 DIAGNOSIS — L820 Inflamed seborrheic keratosis: Secondary | ICD-10-CM | POA: Diagnosis not present

## 2019-09-09 DIAGNOSIS — M5030 Other cervical disc degeneration, unspecified cervical region: Secondary | ICD-10-CM | POA: Diagnosis not present

## 2019-09-19 DIAGNOSIS — J449 Chronic obstructive pulmonary disease, unspecified: Secondary | ICD-10-CM | POA: Diagnosis not present

## 2019-09-19 DIAGNOSIS — Z79899 Other long term (current) drug therapy: Secondary | ICD-10-CM | POA: Diagnosis not present

## 2019-09-19 DIAGNOSIS — R5382 Chronic fatigue, unspecified: Secondary | ICD-10-CM | POA: Diagnosis not present

## 2019-09-19 DIAGNOSIS — M2550 Pain in unspecified joint: Secondary | ICD-10-CM | POA: Diagnosis not present

## 2019-10-20 DIAGNOSIS — F419 Anxiety disorder, unspecified: Secondary | ICD-10-CM | POA: Diagnosis not present

## 2019-10-20 DIAGNOSIS — M2550 Pain in unspecified joint: Secondary | ICD-10-CM | POA: Diagnosis not present

## 2019-10-20 DIAGNOSIS — G609 Hereditary and idiopathic neuropathy, unspecified: Secondary | ICD-10-CM | POA: Diagnosis not present

## 2019-10-20 DIAGNOSIS — J449 Chronic obstructive pulmonary disease, unspecified: Secondary | ICD-10-CM | POA: Diagnosis not present

## 2019-10-30 DIAGNOSIS — Z23 Encounter for immunization: Secondary | ICD-10-CM | POA: Diagnosis not present

## 2019-11-05 DIAGNOSIS — M19021 Primary osteoarthritis, right elbow: Secondary | ICD-10-CM | POA: Diagnosis not present

## 2019-11-05 DIAGNOSIS — M19011 Primary osteoarthritis, right shoulder: Secondary | ICD-10-CM | POA: Diagnosis not present

## 2019-11-17 DIAGNOSIS — R5383 Other fatigue: Secondary | ICD-10-CM | POA: Diagnosis not present

## 2019-11-17 DIAGNOSIS — M2550 Pain in unspecified joint: Secondary | ICD-10-CM | POA: Diagnosis not present

## 2019-11-17 DIAGNOSIS — Z79899 Other long term (current) drug therapy: Secondary | ICD-10-CM | POA: Diagnosis not present

## 2019-11-17 DIAGNOSIS — H9190 Unspecified hearing loss, unspecified ear: Secondary | ICD-10-CM | POA: Diagnosis not present

## 2019-11-28 DIAGNOSIS — H903 Sensorineural hearing loss, bilateral: Secondary | ICD-10-CM | POA: Diagnosis not present

## 2019-12-10 DIAGNOSIS — Z961 Presence of intraocular lens: Secondary | ICD-10-CM | POA: Diagnosis not present

## 2019-12-10 DIAGNOSIS — H52203 Unspecified astigmatism, bilateral: Secondary | ICD-10-CM | POA: Diagnosis not present

## 2019-12-10 DIAGNOSIS — H43813 Vitreous degeneration, bilateral: Secondary | ICD-10-CM | POA: Diagnosis not present

## 2019-12-10 DIAGNOSIS — H26493 Other secondary cataract, bilateral: Secondary | ICD-10-CM | POA: Diagnosis not present

## 2019-12-15 DIAGNOSIS — Z79899 Other long term (current) drug therapy: Secondary | ICD-10-CM | POA: Diagnosis not present

## 2019-12-15 DIAGNOSIS — J449 Chronic obstructive pulmonary disease, unspecified: Secondary | ICD-10-CM | POA: Diagnosis not present

## 2019-12-15 DIAGNOSIS — F419 Anxiety disorder, unspecified: Secondary | ICD-10-CM | POA: Diagnosis not present

## 2019-12-15 DIAGNOSIS — R5383 Other fatigue: Secondary | ICD-10-CM | POA: Diagnosis not present

## 2019-12-30 ENCOUNTER — Telehealth: Payer: Self-pay

## 2019-12-30 NOTE — Telephone Encounter (Signed)
Dr. Rexene Alberts and Dr. Sheryle Hail, pt's PCP, discussed this pt via phone today. It was agreed that this pt should be referred to another neurology practice. Please see Dr. Guadelupe Sabin documentation from 2018 regarding this pt.

## 2019-12-31 ENCOUNTER — Ambulatory Visit: Payer: Medicare Other | Admitting: Neurology

## 2020-01-06 DIAGNOSIS — M19021 Primary osteoarthritis, right elbow: Secondary | ICD-10-CM | POA: Diagnosis not present

## 2020-01-06 DIAGNOSIS — M25521 Pain in right elbow: Secondary | ICD-10-CM | POA: Diagnosis not present

## 2020-01-06 DIAGNOSIS — M19011 Primary osteoarthritis, right shoulder: Secondary | ICD-10-CM | POA: Diagnosis not present

## 2020-01-08 ENCOUNTER — Encounter: Payer: Self-pay | Admitting: Neurology

## 2020-01-12 DIAGNOSIS — Z79899 Other long term (current) drug therapy: Secondary | ICD-10-CM | POA: Diagnosis not present

## 2020-01-12 DIAGNOSIS — F419 Anxiety disorder, unspecified: Secondary | ICD-10-CM | POA: Diagnosis not present

## 2020-01-12 DIAGNOSIS — R5383 Other fatigue: Secondary | ICD-10-CM | POA: Diagnosis not present

## 2020-01-12 DIAGNOSIS — J449 Chronic obstructive pulmonary disease, unspecified: Secondary | ICD-10-CM | POA: Diagnosis not present

## 2020-02-27 ENCOUNTER — Other Ambulatory Visit (INDEPENDENT_AMBULATORY_CARE_PROVIDER_SITE_OTHER): Payer: Medicare Other

## 2020-02-27 ENCOUNTER — Other Ambulatory Visit: Payer: Self-pay

## 2020-02-27 ENCOUNTER — Encounter: Payer: Self-pay | Admitting: Neurology

## 2020-02-27 ENCOUNTER — Ambulatory Visit (INDEPENDENT_AMBULATORY_CARE_PROVIDER_SITE_OTHER): Payer: Medicare Other | Admitting: Neurology

## 2020-02-27 VITALS — BP 116/70 | HR 86 | Ht 70.0 in | Wt 164.2 lb

## 2020-02-27 DIAGNOSIS — G629 Polyneuropathy, unspecified: Secondary | ICD-10-CM | POA: Diagnosis not present

## 2020-02-27 DIAGNOSIS — R413 Other amnesia: Secondary | ICD-10-CM | POA: Diagnosis not present

## 2020-02-27 LAB — VITAMIN B12: Vitamin B-12: 858 pg/mL (ref 211–911)

## 2020-02-27 LAB — FOLATE: Folate: 22.4 ng/mL (ref >5.9)

## 2020-02-27 LAB — TSH: TSH: 3.15 u[IU]/mL (ref 0.35–4.50)

## 2020-02-27 NOTE — Progress Notes (Signed)
NEUROLOGY CONSULTATION NOTE  Jose [name] MRN: [id number] DOB: October 18, 1935  Referring provider: Dr. Antonietta Jewel Primary care provider: Dr. Jilda Panda  Reason for consult:  Memory loss  Dear Dr  Sheryle Hail:  Thank you for your kind referral of Jose [name] for consultation of the above symptoms. Although his history is well known to you, please allow me to reiterate it for the purpose of our medical record. He is alone in the office today. Records and images were personally reviewed where available.   HISTORY OF PRESENT ILLNESS: This is an 85 year old right-handed man with a history of hyperlipidemia, sleep apnea, IBS, spinal stenosis, chronic fatigue syndrome, presenting for evaluation of memory loss. PCP notes indicate patient reported fatigue, increasing difficulty with memory recall, difficulty maintaining balance. He has declined to take antidepressants through his PCP, complaining of worsening anxiety. He lives alone, he has a girlfriend who goes home at midnight. He states his memory is lousy. He noticed memory issues since he was diagnosed with Chronic Fatigue Syndrome 45 years ago, but worse in the past 2-3 years. He feels his driving is not as good as before, sometimes it is his attention, he is not looking where he is going and does not trust himself. He denies getting lost or getting into any accidents. He denies missing medications or bill payments. He misplaces things on occasion. He does not cook that much. He has noticed word-finding difficulties. He noticed that he did not have any physical symptoms early on for 40 years, no difficulties doing physical activity, but now he can barely walk, his balance has gone completely out the window. He gets out of breath and very exhausted. Sometimes he just shuffles around and has had a few falls. He recalls going down a mountain 4-5 years ago and losing his balance, falling 5 times during the trip down. Since then his balance has  not been good. Mood is usually depressed recently, he managed to struggle through it for the past 40 years but suddenly it got worse. He feels it came on rather suddenly 2-3 years ago especially with the loss of prior physical abilities. He can't run at all now, getting very, very tired with his legs feeling very, very weak. He religiously does leg weight and cannot walk the same amount. He has occasional neck and back pain, pain in shoulders, elbow, and knees. No focal numbness/tingling, bowel/bladder dysfunction. Sleep is okay with Xanax, he usually gets 9 hours of sleep then would get fits of drowsiness, sometimes taking 2 45-60 minutes naps during the day. He takes Ritalin for Chronic Fatigue Syndrome which helps some but what he has found helpful is prednisone. He takes 5 Ritalin tablets daily but still needs naps, worse after eating. He states he is "allergic to food, allergic to the world." He had a fall off the treadmill in 2020, he does not remember much, apparently he hit his head and injured his ear. He reports drinking "lots of alcohol, bottle of [place] daily all my life." He states he comes from an alcoholic family, he tolerates alcohol and it perks him up. No family history of dementia.    PAST MEDICAL HISTORY: Past Medical History:  Diagnosis Date  . Acute meniscal tear of knee    RIGHT KNEE  . Arthritis   . Hives    PER PT UNKNOWN WHY  . IBS (irritable bowel syndrome)   . Mild sleep apnea    PER STUDY  11-30-2010--  NO CPAP  . Spinal stenosis     PAST SURGICAL HISTORY: Past Surgical History:  Procedure Laterality Date  . APPENDECTOMY    . KNEE ARTHROSCOPY WITH LATERAL MENISECTOMY Right 04/12/2012   Procedure: RIGHT KNEE ARTHROSCOPY WITH PARTIAL MEDIAL AND LATERAL MENISECTOMY;  Surgeon: Magnus Sinning, MD;  Location: [facility];  Service: Orthopedics;  Laterality: Right;  . SHOULDER ARTHROSCOPY WITH ROTATOR CUFF REPAIR AND SUBACROMIAL DECOMPRESSION   12-21-1999  &  01-28-2004    MEDICATIONS: Current Outpatient Medications on File Prior to Visit  Medication Sig Dispense Refill  . ALPRAZolam (XANAX) 0.5 MG tablet Take 0.5 mg by mouth at bedtime as needed for sleep.     Marland Kitchen aspirin 81 MG tablet Take 81 mg by mouth daily.    . baclofen (LIORESAL) 10 MG tablet Take 20 mg by mouth daily as needed for pain.    . celecoxib (CELEBREX) 200 MG capsule Take 200 mg by mouth daily.     . Lactobacillus (ACIDOPHILUS PO) Take 1 capsule by mouth daily.     . methylphenidate (RITALIN) 5 MG tablet Take 5 mg by mouth daily as needed for anxiety.    . mometasone (NASONEX) 50 MCG/ACT nasal spray Place 1 spray into the nose daily as needed (allergies).     . Multiple Vitamin (MULTIVITAMIN) tablet Take 1 tablet by mouth daily.    . Multiple Vitamins-Minerals (ZINC PO) Take 1 tablet by mouth daily.     Marland Kitchen omeprazole (PRILOSEC) 20 MG capsule Take 20 mg by mouth every morning.     . vitamin C (ASCORBIC ACID) 500 MG tablet Take 500 mg by mouth daily.     No current facility-administered medications on file prior to visit.    ALLERGIES: No Known Allergies  FAMILY HISTORY: Family History  Problem Relation Age of Onset  . Heart disease Mother   . Heart disease Father     SOCIAL HISTORY: Social History   Socioeconomic History  . Marital status: Divorced    Spouse name: Not on file  . Number of children: Not on file  . Years of education: Not on file  . Highest education level: Not on file  Occupational History  . Not on file  Tobacco Use  . Smoking status: Former Smoker    Years: 17.00    Types: Cigarettes  . Smokeless tobacco: Never Used  . Tobacco comment: QUIT SMOKING 40 YRS AGO  Vaping Use  . Vaping Use: Never used  Substance and Sexual Activity  . Alcohol use: Yes    Comment: ONE BOTTLE WINE PER DAY  . Drug use: No  . Sexual activity: Not on file  Other Topics Concern  . Not on file  Social History Narrative   Right handed   Lives  alone   Social Determinants of Health   Financial Resource Strain: Not on file  Food Insecurity: Not on file  Transportation Needs: Not on file  Physical Activity: Not on file  Stress: Not on file  Social Connections: Not on file  Intimate Partner Violence: Not on file     PHYSICAL EXAM: Vitals:   02/27/20 1246  BP: 116/70  Pulse: 86  SpO2: 97%   General: No acute distress Head:  Normocephalic/atraumatic Skin/Extremities: No rash, no edema Neurological Exam: Mental status: alert and oriented to person, place, and time, no dysarthria or aphasia, Fund of knowledge is appropriate.  Remote memory intact.  Attention and concentration are normal.    Able to name  objects and repeat phrases. [place] Cognitive Assessment  02/27/2020  Visuospatial/ Executive (0/5) 4  Naming (0/3) 3  Attention: Read list of digits (0/2) 2  Attention: Read list of letters (0/1) 1  Attention: Serial 7 subtraction starting at 100 (0/3) 3  Language: Repeat phrase (0/2) 2  Language : Fluency (0/1) 0  Abstraction (0/2) 1  Delayed Recall (0/5) 1  Orientation (0/6) 6  Total 23  Adjusted Score (based on education) 23    Cranial nerves: CN I: not tested CN II: pupils equal, round and reactive to light, visual fields intact CN III, IV, VI:  full range of motion, no nystagmus, no ptosis CN V: facial sensation intact CN VII: upper and lower face symmetric CN VIII: hearing intact to conversation CN XI: sternocleidomastoid and trapezius muscles intact CN XII: tongue midline Bulk & Tone: normal, no fasciculations. Motor: 5/5 throughout with no pronator drift. Sensation: intact to light touch, cold, pin on both UE. Decreased cold to ankles, decreased vibration to knees bilaterally. Intact pin. Romberg test slight sway Deep Tendon Reflexes: +2 on both UE, unable to elicit on both LE Plantar responses: downgoing bilaterally Cerebellar: no incoordination on finger to nose testing Gait: narrow-based  and steady, difficulty with tandem walk Tremor: none  IMPRESSION: This is an 85 year old right-handed man with a history of hyperlipidemia, sleep apnea, IBS, spinal stenosis, chronic fatigue syndrome, presenting for evaluation of memory loss. 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 We discussed different causes of memory loss, bloodwork for reversible causes will be ordered. MRI brain without contrast will be ordered to assess for underlying structural abnormality and assess vascular load. He will be scheduled for Neurocognitive testing to further evaluate cognitive concerns. We discussed how excessive alcohol intake can also affect memory and balance (alcoholic neuropathy). Check B1, folate, EMG/NCV of both lower extremities to further evaluate symptoms. He was advised to start cutting down on alcohol intake. Follow-up after tests, he knows to call for any changes.   Thank you for allowing me to participate in the care of this patient. Please do not hesitate to call for any questions or concerns.   Jose [name], M.D.  CC: Dr. Sheryle Hail, Dr. Mellody Drown

## 2020-02-27 NOTE — Patient Instructions (Signed)
1. Bloodwork for TSH, B12, B1, folate  2. Schedule MRI brain without contrast  3. Schedule EMG/NCV of both legs  4. Schedule Neurocognitive testing  5. Follow-up after tests, call for any changes   You have been referred for a neuropsychological evaluation (i.e., evaluation of memory and thinking abilities). Please bring someone with you to this appointment if possible, as it is helpful for the doctor to hear from both you and another adult who knows you well. Please bring eyeglasses and hearing aids if you wear them.    The evaluation will take approximately 3 hours and has two parts:   . The first part is a clinical interview with the neuropsychologist (Dr. Melvyn Novas or Dr. Nicole Kindred). During the interview, the neuropsychologist will speak with you and the individual you brought to the appointment.    . The second part of the evaluation is testing with the doctor's technician Hinton Dyer or Maudie Mercury). During the testing, the technician will ask you to remember different types of material, solve problems, and answer some questionnaires. Your family member will not be present for this portion of the evaluation.   Please note: We must reserve several hours of the neuropsychologist's time and the psychometrician's time for your evaluation appointment. As such, there is a No-Show fee of $100. If you are unable to attend any of your appointments, please contact our office as soon as possible to reschedule.    RECOMMENDATIONS FOR ALL PATIENTS WITH MEMORY PROBLEMS: 1. Continue to exercise (Recommend 30 minutes of walking everyday, or 3 hours every week) 2. Increase social interactions - continue going to [place] and enjoy social gatherings with friends and family 3. Eat healthy, avoid fried foods and eat more fruits and vegetables 4. Maintain adequate blood pressure, blood sugar, and blood cholesterol level. Reducing the risk of stroke and cardiovascular disease also helps promoting better memory. 5. Avoid  stressful situations. Live a simple life and avoid aggravations. Organize your time and prepare for the next day in anticipation. 6. Sleep well, avoid any interruptions of sleep and avoid any distractions in the bedroom that may interfere with adequate sleep quality 7. Avoid sugar, avoid sweets as there is a strong link between excessive sugar intake, diabetes, and cognitive impairment The Mediterranean diet has been shown to help patients reduce the risk of progressive memory disorders and reduces cardiovascular risk. This includes eating fish, eat fruits and green leafy vegetables, nuts like almonds and hazelnuts, walnuts, and also use olive oil. Avoid fast foods and fried foods as much as possible. Avoid sweets and sugar as sugar use has been linked to worsening of memory function.

## 2020-03-02 LAB — VITAMIN B1: Vitamin B1 (Thiamine): 7 nmol/L — ABNORMAL LOW (ref 8–30)

## 2020-03-11 ENCOUNTER — Telehealth: Payer: Self-pay

## 2020-03-11 NOTE — Telephone Encounter (Signed)
-----   Message from Cameron Sprang, MD sent at 03/11/2020 12:06 PM EST ----- Pls let him know his thiamine level was low, otherwise the rest of bloodwork was okay, recommend starting Thiamine 100mg  daily. Proceed with nerve test as scheduled. Thanks

## 2020-03-11 NOTE — Telephone Encounter (Signed)
Pt called no answer left a voice mail to call the office back  °

## 2020-03-12 ENCOUNTER — Telehealth: Payer: Self-pay

## 2020-03-12 NOTE — Telephone Encounter (Signed)
-----   Message from Cameron Sprang, MD sent at 03/11/2020 12:06 PM EST ----- Pls let him know his thiamine level was low, otherwise the rest of bloodwork was okay, recommend starting Thiamine 100mg  daily. Proceed with nerve test as scheduled. Thanks

## 2020-03-12 NOTE — Telephone Encounter (Signed)
Pt called no answer left voce mail to call the office back

## 2020-03-12 NOTE — Telephone Encounter (Signed)
Pt called and informed that thiamine level was low, otherwise the rest of bloodwork was okay, recommend starting Thiamine 100mg  daily. Proceed with nerve test as scheduled. Pt verbalized understanding

## 2020-03-19 ENCOUNTER — Ambulatory Visit
Admission: RE | Admit: 2020-03-19 | Discharge: 2020-03-19 | Disposition: A | Discharge status: Home / Self Care | Payer: Medicare Other | Source: Ambulatory Visit | Attending: Neurology | Admitting: Neurology

## 2020-03-19 DIAGNOSIS — R413 Other amnesia: Secondary | ICD-10-CM

## 2020-03-19 DIAGNOSIS — G629 Polyneuropathy, unspecified: Secondary | ICD-10-CM

## 2020-03-23 NOTE — Progress Notes (Signed)
Left message to call office at [phone] 03/23/2020

## 2020-03-24 ENCOUNTER — Telehealth: Payer: Self-pay

## 2020-03-24 ENCOUNTER — Telehealth: Payer: Self-pay | Admitting: Neurology

## 2020-03-24 NOTE — Telephone Encounter (Signed)
Patient called in wanting his MRI results, he said he had a message from [place] about them. He said if he doesn't answer he would like the information left in a detailed message.

## 2020-03-24 NOTE — Telephone Encounter (Signed)
-----   Message from Cameron Sprang, MD sent at 03/23/2020 12:53 PM EST ----- Pls let him know brain MRI did not show any evidence of tumor, stroke, or bleed. It showed age-related changes. Proceed with nerve and muscle test, and Neurocognitive testing as scheduled. Thanks

## 2020-03-24 NOTE — Telephone Encounter (Signed)
Pt called no answer left a voice mail per pt and DPR MRI did not show any evidence of tumor, stroke, or bleed. It showed age-related changes. Proceed with nerve and muscle test, and Neurocognitive testing as scheduled

## 2020-03-24 NOTE — Telephone Encounter (Signed)
See result notes. 

## 2020-03-31 ENCOUNTER — Ambulatory Visit: Payer: Medicare Other | Admitting: Neurology

## 2020-04-02 ENCOUNTER — Ambulatory Visit: Payer: Medicare Other | Admitting: Neurology

## 2020-04-14 ENCOUNTER — Ambulatory Visit (INDEPENDENT_AMBULATORY_CARE_PROVIDER_SITE_OTHER): Payer: Medicare Other | Admitting: Neurology

## 2020-04-14 ENCOUNTER — Other Ambulatory Visit: Payer: Self-pay

## 2020-04-14 DIAGNOSIS — R413 Other amnesia: Secondary | ICD-10-CM

## 2020-04-14 DIAGNOSIS — G629 Polyneuropathy, unspecified: Secondary | ICD-10-CM

## 2020-04-14 NOTE — Procedures (Signed)
[facility]  [address], [address]  [place], [place] [address] Tel: [phone] Fax:  [phone] Test Date:  04/14/2020  Patient: Jose [name] DOB: 11-23-1935 Physician: Narda Amber, DO  Sex: Male Height: 5\' 10"  Ref Phys: Ellouise Newer, M.D.  ID#: [id number]   Technician:    Patient Complaints: This is a 85 year old man referred for evaluation of bilateral leg paresthesias and weakness  NCV & EMG Findings: Extensive electrodiagnostic testing of the right lower extremity and additional studies of the left shows  1. Bilateral superficial peroneal sensory responses are absent.  Bilateral sural sensory responses are within normal limits. 2. Bilateral peroneal and tibial motor responses are within normal limits 3. Bilateral tibial H reflex studies show prolonged latencies. 4. Chronic motor axonal loss changes are seen affecting the anterior tibialis and flexor digitorum longus muscles bilaterally.  There is no evidence of accompanied active denervation.  Impression: The electrophysiologic findings are consistent with a chronic sensorimotor axonal polyneuropathy affecting the lower extremities.    ___________________________ Narda Amber, DO    Nerve Conduction Studies Anti Sensory Summary Table   Stim Site NR Peak (ms) Norm Peak (ms) P-T Amp (V) Norm P-T Amp  Left Sup Peroneal Anti Sensory (Ant Lat Mall)  32C  12 cm NR  <4.6  >3  Right Sup Peroneal Anti Sensory (Ant Lat Mall)  32C  12 cm NR  <4.6  >3  Left Sural Anti Sensory (Lat Mall)  32C  Calf    3.2 <4.6 9.1 >3  Right Sural Anti Sensory (Lat Mall)  32C  Calf    2.9 <4.6 5.4 >3   Motor Summary Table   Stim Site NR Onset (ms) Norm Onset (ms) O-P Amp (mV) Norm O-P Amp Site1 Site2 Delta-0 (ms) Dist (cm) Vel (m/s) Norm Vel (m/s)  Left Peroneal Motor (Ext Dig Brev)  32C  Ankle    4.5 <6.0 3.7 >2.5 B Fib Ankle 7.3 35.0 48 >40  B Fib    11.8  3.5  Poplt B Fib 2.0 9.0 45 >40  Poplt    13.8  3.4          Right Peroneal Motor (Ext Dig Brev)  32C  Ankle    3.8 <6.0 3.7 >2.5 B Fib Ankle 7.7 36.0 47 >40  B Fib    11.5  3.5  Poplt B Fib 2.0 9.0 45 >40  Poplt    13.5  3.3         Left Tibial Motor (Abd Hall Brev)  32C  Ankle    5.6 <6.0 5.3 >4 Knee Ankle 9.0 42.0 47 >40  Knee    14.6  5.2         Right Tibial Motor (Abd Hall Brev)  32C  Ankle    4.6 <6.0 4.4 >4 Knee Ankle 7.9 43.0 54 >40  Knee    12.5  4.0          H Reflex Studies   NR H-Lat (ms) Lat Norm (ms) L-R H-Lat (ms)  Left Tibial (Gastroc)  32C     37.69 <35 1.50  Right Tibial (Gastroc)  32C     36.19 <35 1.50   EMG   Side Muscle Ins Act Fibs Psw Fasc Number Recrt Dur Dur. Amp Amp. Poly Poly. Comment  Right AntTibialis Nml Nml Nml Nml 1- Rapid Few 1+ Few 1+ Few 1+ N/A  Right Gastroc Nml Nml Nml Nml Nml Nml Nml Nml Nml Nml Nml Nml N/A  Right Flex Dig Long Nml Nml Nml Nml 1- Rapid Many 1+ Many 1+ Many 1+ N/A  Right RectFemoris Nml Nml Nml Nml Nml Nml Nml Nml Nml Nml Nml Nml N/A  Right GluteusMed Nml Nml Nml Nml Nml Nml Nml Nml Nml Nml Nml Nml N/A  Left Gastroc Nml Nml Nml Nml Nml Nml Nml Nml Nml Nml Nml Nml N/A  Left AntTibialis Nml Nml Nml Nml 1- Rapid Few 1+ Few 1+ Few 1+ N/A  Left Flex Dig Long Nml Nml Nml Nml 1- Rapid Many 1+ Many 1+ Many 1+ N/A  Left RectFemoris Nml Nml Nml Nml Nml Nml Nml Nml Nml Nml Nml Nml N/A  Left GluteusMed Nml Nml Nml Nml Nml Nml Nml Nml Nml Nml Nml Nml N/A      Waveforms:

## 2020-04-21 ENCOUNTER — Telehealth: Payer: Self-pay

## 2020-04-21 NOTE — Telephone Encounter (Signed)
Pt called and informed that nerve and muscle test confirms neuropathy in both feet, which causes balance problems. Proceed with Memory testing as scheduled. Pt verbalized understanding

## 2020-04-21 NOTE — Telephone Encounter (Signed)
-----   Message from Cameron Sprang, MD sent at 04/20/2020  4:41 PM EDT ----- Pls let him know the nerve and muscle test confirms neuropathy in both feet, which causes balance problems. Proceed with Memory testing as scheduled.

## 2020-05-05 ENCOUNTER — Ambulatory Visit (INDEPENDENT_AMBULATORY_CARE_PROVIDER_SITE_OTHER): Payer: Medicare Other | Admitting: Counselor

## 2020-05-05 ENCOUNTER — Encounter: Payer: Self-pay | Admitting: Counselor

## 2020-05-05 ENCOUNTER — Ambulatory Visit: Payer: Medicare Other | Admitting: Psychology

## 2020-05-05 ENCOUNTER — Other Ambulatory Visit: Payer: Self-pay

## 2020-05-05 DIAGNOSIS — G3184 Mild cognitive impairment, so stated: Secondary | ICD-10-CM

## 2020-05-05 DIAGNOSIS — F09 Unspecified mental disorder due to known physiological condition: Secondary | ICD-10-CM

## 2020-05-05 DIAGNOSIS — R5382 Chronic fatigue, unspecified: Secondary | ICD-10-CM

## 2020-05-05 DIAGNOSIS — E519 Thiamine deficiency, unspecified: Secondary | ICD-10-CM

## 2020-05-05 DIAGNOSIS — G9332 Myalgic encephalomyelitis/chronic fatigue syndrome: Secondary | ICD-10-CM

## 2020-05-05 DIAGNOSIS — F1010 Alcohol abuse, uncomplicated: Secondary | ICD-10-CM

## 2020-05-05 NOTE — Progress Notes (Signed)
   Psychometrist Note   Cognitive testing was administered to Jose [name] by Milana Kidney, B.S. (Technician) under the supervision of Alphonzo Severance, Psy.D., ABN. [name] was able to tolerate all test procedures. Dr. Nicole Kindred met with the patient as needed to manage any emotional reactions to the testing procedures. Rest breaks were offered.    The battery of tests administered was selected by Dr. Nicole Kindred with consideration to the patient's current level of functioning, the nature of his symptoms, emotional and behavioral responses during the interview, level of literacy, observed level of motivation/effort, and the nature of the referral question. This battery was communicated to the psychometrist. Communication between Dr. Nicole Kindred and the psychometrist was ongoing throughout the evaluation and Dr. Nicole Kindred was immediately accessible at all times. Dr. Nicole Kindred provided supervision to the technician on the date of this service, to the extent necessary to assure the quality of all services provided.    [name] will return in approximately one week for an interactive feedback session with Dr. Nicole Kindred, at which time test performance, clinical impressions, and treatment recommendations will be reviewed in detail. The patient understands he can contact our office should he require our assistance before this time.   A total of 130 minutes of billable time were spent with Ancil Boozer by the technician, including test administration and scoring time. Billing for these services is reflected in Dr. Les Pou note.   This note reflects time spent with the psychometrician and does not include test scores, clinical history, or any interpretations made by Dr. Nicole Kindred. The full report will follow in a separate note.
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
## 2020-05-06 NOTE — Progress Notes (Signed)
[facility]  Patient Name: Jose [name] MRN: [id number] Date of Birth: Aug 19, 1935 Age: 85 y.o. Education: 18 years  Measurement properties of test scores: IQ, Index, and Standard Scores (SS): Mean = 100; Standard Deviation = 15 Scaled Scores (Ss): Mean = 10; Standard Deviation = 3 Z scores (Z): Mean = 0; Standard Deviation = 1 T scores (T); Mean = 50; Standard Deviation = 10  TEST SCORES:    Note: This summary of test scores accompanies the interpretive report and should not be interpreted by unqualified individuals or in isolation without reference to the report. Test scores are relative to age, gender, and educational history as available and appropriate.   Performance Validity        "A" Random Letter Test Raw  Descriptor      Errors 1 Within Expectation  The Dot Counting Test: 8 Within Expectation      Embedded Measures: Raw  Descriptor      NAB Effort Index 3 Below Expectation      Mental Status Screening     Total Score Descriptor  MoCA 23 MCI      Expected Functioning        Wide Range Achievement Test: Standard/Scaled Score Percentile      Word Reading 134 99      Reynolds Intellectual Screening Test Standard/T-score Percentile      Guess What 68 96      Odd Item Out 60 84  RIST Index 127 96      Attention/Processing Speed        Neuropsychological Assessment Battery (Attention Module, Form 1): Scaled/T-score Percentile      Digits Forward 74 99      Digits Backwards 59 82      Repeatable Battery for the Assessment of Neuropsychological Status (Form A): Standard Score Percentile     Coding 6 9      Language        Neuropsychological Assessment Battery (Language Module, Form 1): T-score Percentile      Naming   (31) 63 91      Verbal Fluency:  T Score Percentile      Controlled Oral Word Association (F-A-S) 40 16      Semantic Fluency (Animals) 43 25      Memory:        Neuropsychological Assessment  Battery (Memory Module, Form 1): T-score/Standard Score Percentile  Memory Index (MEM): 75 5      List Learning           List A Immediate Recall   (2 , 5 , 7) 36 8         List B Immediate Recall   (2) 38 12         List A Short Delayed Recall   (1) 25 1         List A Long Delayed Recall   (0) 27 1         List A Percent Retention   (0 %) --- 2         List A Long Delayed Yes/No Recognition Hits   (10) --- 31         List A Long Delayed Yes/No Recognition False Alarms   (9) --- 18         List A Recognition Discriminability Index --- 9      Shape Learning           Immediate Recognition   (  1 , 3 , 6) 38 12         Delayed Recognition   (4) 44 27         Percent Retention   (67 %) --- 21         Delayed Forced-Choice Recognition Hits   (8) --- 62         Delayed Forced-Choice Recognition False Alarms   (4) --- 18         Delayed Forced-Choice Recognition Discriminability --- 25      Story Learning           Immediate Recall   (22, 27) 41 18         Delayed Recall   (14) 33 5         Percent Retention   (52 %) --- 14      Daily Living Memory            Immediate Recall   (25, 19) 57 75          Delayed Recall   (8, 1) 38 12          Percent Retention (53 %) --- 8          Recognition Hits   (10) --- 91      Repeatable Battery for the Assessment of Neuropsychological Status (Form A): Scaled Score Percentile         Figure Recall   (7) 7 16      Visuospatial/Constructional Functioning        Repeatable Battery for the Assessment of Neuropsychological Status (Form A): Standard/Scaled Score Percentile      Visuospatial/Constructional Index 121 92         Figure Copy   (18) 11 63         Judgment of Line Orientation   (20) --- >75      Executive Functioning        Modified [place] Card Sorting Test (MWCST): Standard/T-Score Percentile      Number of Categories Correct 40 16      Number of Perseverative Errors 41 18      Number of Total Errors 44 27      Percent Perseverative  Errors 44 27  Executive Function Composite 84 14      Trail Making Test: T-Score Percentile      Part A 36 8      Part B 45 31      Boston Diagnostic Aphasia Exam: Raw Score Scaled Score      Complex Ideational Material 11 9      Rating Scales         Raw Score Descriptor  GAD-7 9 Mild      Clinical Dementia Rating Raw Score Descriptor      Sum of Boxes 1.5 Mild Cognitive Impairment      Global Score 0.5 MCI       Raw Score Descriptor  Geriatric Depression Scale - Short Form 13 Positive   Joniya Boberg V. Nicole Kindred PsyD, [place] Clinical Neuropsychologist

## 2020-05-10 NOTE — Progress Notes (Signed)
[facility]  Patient Name: Jose [name] MRN: [id number] Date of Birth: 1935-12-03 Age: 85 y.o. Education: 31 years  Clinical Impressions  [name] is a 85 y.o., right-hand dominant, partnered man with a history of CFS, HLD, sleep apnea, and spinal stenosis with memory problems. He was a bit of an unclear historian but it sounds as though he appreciated some cognitive problems when he was 89 and first became symptomatic of his CFS. He then started having increased cognitive difficulties over the past 2 years or so. He also appreciates significant changes in his physical health status over the past 6 months with gait difficulties, fatigue, and the like, which are extensively detailed in Dr. Amparo Bristol notes. He was found to have a chronic sensorimotor axonal polyneuropathy on recent EMG/NCV with Dr. Posey Pronto. He has an MRI of the brain that shows ventriculomegaly, likely ex vacuo given specific imaging features. There is also significant frontal volume loss in the cortex, particularly along midline near the margin of the anterior cingulate gyrus. There is also some hippocampal volume loss. There is relative preservation of posterior parietal and occipital regions. There is a mild burden of leukoaraiosis, not enough to cause but perhaps enough to contribute to a dementia.   On neuropsychological testing, [name] demonstrated very superior single word reading and superior performance with respect to measured IQ, which is consistent with his report of high measured intelligence as a child. This sets a stringent standard for his cognitive test data. His performance was below expectations on measures of memory with an unusually low overall memory performance level and he also had unusually low processing speed performance on timed number-symbol coding. The profile of memory performance shows middling encoding and marginal but still adequate  retention of information across time in most cases and is not clearly convincing for a storage deficit. He had reasonable scores elsewhere across the test battery. He did very well on visuospatial/constructional tasks and has very good attention and working memory as measured by digit repetition. I would place his CDR in the mild cognitive impairment range. He screened positive for the presence of depression, but indicated this was mostly over the past period of time, since he has been experiencing declining physical health. He reported a mild level of anxiety symptoms.   [name] is thus demonstrating a mild cognitive impairment level problem. His primary difficulties were with respect to memory, although it is not clear to me that this necessarily represents a developing storage problem. He should be followed over time. Chronic excessive alcohol consumption presents as possibly explanatory and is almost certainly contributory, particularly given the pattern of central and frontal volume loss on his imaging and his thiamine deficiency. He does not appear to have frank Wernicke-Korsakoff's syndrome. CFS and/or mood changes related to advancing age and declining health may be contributing as well.   Diagnostic Impressions: Mild neurocognitive disorder Excessive consumption of alcohol  Adjustment disorder with depressed mood   Recommendations to be discussed with patient  Your performance and presentation were consistent with less than expected performance on measures of memory and processing speed. These findings are suggestive of a mild level neurocognitive disorder.   The major difference between mild cognitive impairment (MCI) and dementia is in severity and potential prognosis. Once someone reaches a level of severity adequate to be diagnosed with a dementia, there is usually progression over time, though this may be years. On the other hand, mild cognitive impairment, while a significant risk  for dementia in future, does not always progress to dementia, and in some instances stays the same or can even revert to normal. It is important to realize that if MCI is due to underlying Alzheimer's disease, it will most likely progress to dementia eventually. The rate of conversion to Alzheimer's dementia from amnestic MCI is about 15% per year versus the general population risk of conversion of 2% per year.   In your case, I use the term mild neurocognitive disorder intentionally to denote the fact that there is uncertainty as to whether this is due to an underlying brain condition. It is possible that your difficulties will worsen over time, but it is also possible that they are due to modifiable risk factors and thus will improve or stay the same if those risk factors are addressed.   I am specifically worried about your current level of alcohol consumption. While alcohol is well tolerated by some individuals, drinking over recommended limits places you at risk for a number of conditions including dementia, liver problems, nutritional deficiencies and other conditions. Alcohol is also not healthy for the brain and over time, chronic high levels of alcohol consumption can cause tissue loss and cognitive diminution. Sometimes, chronic high levels of alcohol consumption can even cause a dementia level problem. This is why I suggest that you limit your consumption to safe levels although you should do so under medical supervision because you are at risk for discontinuation syndrome.   Alcohol overuse also often leads to nutritional deficiencies. You were found to have a thiamine deficiency on your recent laboratory workup, which is a nutritional deficiency that can often be caused by alcohol overuse. Thiamine deficiency can cause a condition known as Wernicke-Korsakoff syndrome, that can be neurologically devestating and presents with profound memory problems similar to what we see in Alzheimer's dementia.  You do not seem to have that syndrome but it is imperative that you begin thiamine supplementation, as recommended by Dr. Delice Lesch, to prevent any further changes.    The other thing that makes me concerned about your alcohol consumption is your MRI of the brain. Your brain has fairly significant central volume loss, which can be caused by alcohol use over long periods of time. You also have a fair amount of frontal atrophy/volume loss, which has also been reported in alcohol overuse. While it is impossible to say that the brain changes on your MRI are caused by alcohol use, that is my suspicion, and if that is the case then this is evidence that your level of alcohol use is adversely affecting your brain.   The other possibility is that your cognitive difficulties are due to an underlying disorder that has not yet been discovered. Alzheimer's disease is the most common such disorder in individuals your age but your test data and presentation are not persuasive for that condition. The best thing to do to ameliorate your chances of further decline if you do have an underlying condition would be to start healthy lifestyle changes.   There is now good quality evidence from at least one large scale study that a modified mediterranean diet may help slow cognitive decline. This is known as the "MIND" diet. The Mind diet is not so much a specific diet as it is a set of recommendations for things that you should and should not eat.   Foods that are ENCOURAGED on the MIND Diet:  Green, leafy vegetables: Aim for six or more servings per week. This includes kale, spinach,  cooked greens and salads.  All other vegetables: Try to eat another vegetable in addition to the green leafy vegetables at least once a day. It is best to choose non-starchy vegetables because they have a lot of nutrients with a low number of calories.  Berries: Eat berries at least twice a week. There is a plethora of research on strawberries, and  other berries such as blueberries, raspberries and blackberries have also been found to have antioxidant and brain health benefits.  Nuts: Try to get five servings of nuts or more each week. The creators of the [place] don't specify what kind of nuts to consume, but it is probably best to vary the type of nuts you eat to obtain a variety of nutrients. Peanuts are a legume and do not fall into this category.  Olive oil: Use olive oil as your main cooking oil. There may be other heart-healthy alternatives such as algae oil, though there is not yet sufficient research upon which to base a formal recommendation.  Whole grains: Aim for at least three servings daily. Choose minimally processed grains like oatmeal, quinoa, brown rice, whole-wheat pasta and 100% whole-wheat bread.  Fish: Eat fish at least once a week. It is best to choose fatty fish like salmon, sardines, trout, tuna and mackerel for their high amounts of omega-3 fatty acids.  Beans: Include beans in at least four meals every week. This includes all beans, lentils and soybeans.  Poultry: Try to eat chicken or [place] at least twice a week. Note that fried chicken is not encouraged on the MIND diet.  Wine: Aim for no more than one glass of alcohol daily. Both red and white wine may benefit the brain. However, much research has focused on the red wine compound resveratrol, which may help protect against Alzheimer's disease.  Foods that are DISCOURAGED on the MIND Diet: Butter and margarine: Try to eat less than 1 tablespoon (about 14 grams) daily. Instead, try using olive oil as your primary cooking fat, and dipping your bread in olive oil with herbs.  Cheese: The MIND diet recommends limiting your cheese consumption to less than once per week.  Red meat: Aim for no more than three servings each week. This includes all beef, pork, lamb and products made from these meats.  [place]ace[place] Pro food: The MIND diet highly discourages fried food, especially the  kind from fast-food restaurants. Limit your consumption to less than once per week.  Pastries and sweets: This includes most of the processed junk food and desserts you can think of. Ice cream, cookies, brownies, snack cakes, donuts, candy and more. Try to limit these to no more than four times a week.  Exercise is also crucially important, although I know you prefer to exercise and are not able to at present related to your mobility issues. I hope that you find an underlying medical cause and are able to return to exercising. I would encourage you to also consider developing an exercise routine that you can do if you cannot return to your previous activities, whether that is stationary bicycle, chair exercises, water aerobics or the like. A fitness forward physical therapist could help you determine an optimal regimen.  Test Findings  Test scores are summarized in additional documentation associated with this encounter. Test scores are relative to age, gender, and educational history as available and appropriate. There were no concerns about performance validity. He performed within normal limits on two standalone measures and below expectations for a normal individual  on one embedded measure. Given that this measure was normed on unimpaired individuals and his performance is right at the cutoff, I think it reflects some impairment as opposed to a validity problem in his case.    General Intellectual Functioning/Achievement:  Performance was very superior on single word reading with an almost errorless performance. The RIST index was also very high, in the superior range, with superior verbal ability and high average visual abilities. Superior was used as a standard of comparison for his cognitive test data.   Attention and Processing Efficiency: Performance on indicators of attention and working memory was very good with very superior digit repetition forward and high average digit repetition backward.  This may suggest a relative weakness in working memory although his score was still high average. Timed number-symbol coding, on the other hand, was unusually low and simple numeric sequencing was also unusually low (the latter is a very easy test).   Language: Performance on language measures showed errorless visual object confrontation naming. Generation of words was low average in response to given letters and the category prompt "animals."   Visuospatial Function: Performance on visuospatial and constructional measures was very good and fell at a high average level overall. Figure copy was near errorless, which is average because it is an easy test. Judgment of angular line orientations was high average.   Learning and Memory: Performance on measures of learning and memory was less than expected. The discrepancy between Mr. Ogren's measured ability level on the RIST index and his memory score would be expected in less than 1% of normal individuals. The number of low subtest scores would be expected in only 2% of intelligence matched individuals. The profile shows middling retention of information over time and inconsistent performance as opposed to a clear storage pattern.   In the verbal realm, Mr. Mau learned 2, 5, and 7 words of a 12-item word list across three learning trials, which is unusually low. He then forgot most of those words after being read a distractor alternate list, with 1 word on short delayed recall and 0 words on long delayed recall, generating extremely low scores. Recognition disriminability for the words for the list versus false choices was also low, in the low average to unusually low range. Memory for a short story was a bit better with low average immediate recall although retention was low average and delayed recall was unusually low. Memory for brief daily-living information was high average but his retention was unusually low and delayed recall was low  average. Recognition discriminability was better in this case, with high average performance.   Memory for visual information in the form of a series of designs that are difficult to verbally encode was low average. Delayed recognition for the designs was average. Performance when identifying the designs amongst competing choices using a yes/no format was average. Delayed recall for a modestly complex visual figure stimulus was low average.   Executive Functions: Performance on executive measures was reasonable, although considering his very high RIST index score it is possible that this represents some mild decline. The Executive Function Composite Score on the Modified [place] Card Sorting Test was low average with low average categories and perseverative errors scores. Alternating sequencing of numbers and letters of the alphabet was average. Generation of words in response to the letters F-A-S was low average.   Rating Scale(s): Mr. Hipple screened positive for the presence of depression, although he did indicate this is in response to his recent  physical decline. I rated a CDR for him considering all available information and would place him in the MCI range with a global score of 0.5 and a sum of boxes of 1.5.   Viviano Simas Nicole Kindred, PsyD, ABN Clinical Neuropsychologist  Coding and Compliance  Billing below reflects technician time, my direct face-to-face time with the patient, time spent in test administration, and time spent in professional activities including but not limited to: neuropsychological test interpretation, integration of neuropsychological test data with clinical history, report preparation, treatment planning, care coordination, and review of diagnostically pertinent medical history or studies.   Services associated with this encounter: Clinical Interview [id number]) plus 155 minutes ([id number]; Neuropsychological Evaluation by Professional)  130 minutes ([id number];  Neuropsychological Testing by Technician)

## 2020-05-12 ENCOUNTER — Ambulatory Visit (INDEPENDENT_AMBULATORY_CARE_PROVIDER_SITE_OTHER): Payer: Medicare Other | Admitting: Counselor

## 2020-05-12 ENCOUNTER — Other Ambulatory Visit: Payer: Self-pay

## 2020-05-12 ENCOUNTER — Encounter: Payer: Self-pay | Admitting: Counselor

## 2020-05-12 DIAGNOSIS — G3184 Mild cognitive impairment, so stated: Secondary | ICD-10-CM

## 2020-05-12 NOTE — Patient Instructions (Signed)
Your performance and presentation were consistent with less than expected performance on measures of memory and processing speed. These findings are suggestive of a mild level neurocognitive disorder.   The major difference between mild cognitive impairment (MCI) and dementia is in severity and potential prognosis. Once someone reaches a level of severity adequate to be diagnosed with a dementia, there is usually progression over time, though this may be years. On the other hand, mild cognitive impairment, while a significant risk for dementia in future, does not always progress to dementia, and in some instances stays the same or can even revert to normal.It is important to realize that if MCI is due to underlying Alzheimer's disease, it will most likely progress to dementia eventually. The rate of conversion to Alzheimer's dementiafrom amnestic MCI is about 15% per year versus the general population risk of conversion of 2% per year.  In your case, I use the term mild neurocognitive disorder intentionally to denote the fact that there is uncertainty as to whether this is due to an underlying brain condition. It is possible that your difficulties will worsen over time, but it is also possible that they are due to modifiable risk factors and thus will improve or stay the same if those risk factors are addressed.   I am specifically worried about your current level of alcohol consumption. While alcohol is well tolerated by some individuals, drinking over recommended limits places you at risk for a number of conditions including dementia, liver problems, nutritional deficiencies and other conditions. Alcohol is also not healthy for the brain and over time, chronic high levels of alcohol consumption can cause tissue loss and cognitive diminution. Sometimes, chronic high levels of alcohol consumption can even cause a dementia level problem. This is why I suggest that you limit your consumption to safe levels  although you should do so under medical supervision because you are at risk for discontinuation syndrome.   Alcohol overuse also often leads to nutritional deficiencies. You were found to have a thiamine deficiency on your recent laboratory workup, which is a nutritional deficiency that can often be caused by alcohol overuse. Thiamine deficiency can cause a condition known as Wernicke-Korsakoff syndrome, that can be neurologically devestating and presents with profound memory problems similar to what we see in Alzheimer's dementia. You do not seem to have that syndrome but it is imperative that you begin thiamine supplementation, as recommended by Dr. Delice Lesch, to prevent any further changes.    The other thing that makes me concerned about your alcohol consumption is your MRI of the brain. Your brain has fairly significant central volume loss, which can be caused by alcohol use over long periods of time. You also have a fair amount of frontal atrophy/volume loss, which has also been reported in alcohol overuse. While it is impossible to say that the brain changes on your MRI are caused by alcohol use, that is my suspicion, and if that is the case then this is evidence that your level of alcohol use is adversely affecting your brain.   The other possibility is that your cognitive difficulties are due to an underlying disorder that has not yet been discovered. Alzheimer's disease is the most common such disorder in individuals your age but your test data and presentation are not persuasive for that condition. The best thing to do to ameliorate your chances of further decline if you do have an underlying condition would be to start healthy lifestyle changes.   There is now  good quality evidence from at least one large scale study that a modified mediterranean diet may help slow cognitive decline. This is known as the "MIND" diet. The Mind diet is not so much a specific diet as it is a set of recommendations  for things that you should and should not eat.   Foods that are ENCOURAGED on the MIND Diet:  Green, leafy vegetables: Aim for six or more servings per week. This includes kale, spinach, cooked greens and salads.  All other vegetables: Try to eat another vegetable in addition to the green leafy vegetables at least once a day. It is best to choose non-starchy vegetables because they have a lot of nutrients with a low number of calories.  Berries: Eat berries at least twice a week. There is a plethora of research on strawberries, and other berries such as blueberries, raspberries and blackberries have also been found to have antioxidant and brain health benefits.  Nuts: Try to get five servings of nuts or more each week. The creators of the [place] don't specify what kind of nuts to consume, but it is probably best to vary the type of nuts you eat to obtain a variety of nutrients. Peanuts are a legume and do not fall into this category.  Olive oil: Use olive oil as your main cooking oil. There may be other heart-healthy alternatives such as algae oil, though there is not yet sufficient research upon which to base a formal recommendation.  Whole grains: Aim for at least three servings daily. Choose minimally processed grains like oatmeal, quinoa, brown rice, whole-wheat pasta and 100% whole-wheat bread.  Fish: Eat fish at least once a week. It is best to choose fatty fish like salmon, sardines, trout, tuna and mackerel for their high amounts of omega-3 fatty acids.  Beans: Include beans in at least four meals every week. This includes all beans, lentils and soybeans.  Poultry: Try to eat chicken or [place] at least twice a week. Note that fried chicken is not encouraged on the MIND diet.  Wine: Aim for no more than one glass of alcohol daily. Both red and white wine may benefit the brain. However, much research has focused on the red wine compound resveratrol, which may help protect against Alzheimer's  disease.  Foods that are DISCOURAGED on the MIND Diet: Butter and margarine: Try to eat less than 1 tablespoon (about 14 grams) daily. Instead, try using olive oil as your primary cooking fat, and dipping your bread in olive oil with herbs.  Cheese: The MIND diet recommends limiting your cheese consumption to less than once per week.  Red meat: Aim for no more than three servings each week. This includes all beef, pork, lamb and products made from these meats.  Maceo Pro food: The MIND diet highly discourages fried food, especially the kind from fast-food restaurants. Limit your consumption to less than once per week.  Pastries and sweets: This includes most of the processed junk food and desserts you can think of. Ice cream, cookies, brownies, snack cakes, donuts, candy and more. Try to limit these to no more than four times a week.  Exercise is also crucially important, although I know you prefer to exercise and are not able to at present related to your mobility issues. I hope that you find an underlying medical cause and are able to return to exercising. I would encourage you to also consider developing an exercise routine that you can do if you cannot return to  your previous activities, whether that is stationary bicycle, chair exercises, water aerobics or the like. A fitness forward physical therapist could help you determine an optimal regimen.

## 2020-05-12 NOTE — Progress Notes (Signed)
   NEUROPSYCHOLOGY FEEDBACK NOTE Jose [name]  Feedback Note: I met with Jose [name] to review the findings resulting from his neuropsychological evaluation. Since the last appointment, he has been about the same. He continues with gait difficulty and balance problems. Time was spent reviewing the impressions and recommendations that are detailed in the evaluation report. We discussed impression of mild neurocognitive disorder, as reflected in the patient instructions. I took time to explain the findings and answer all the patient's questions. I encouraged Jose [name] to contact me should he have any further questions or if further follow up is desired.   Current Medications and Medical History   Current Outpatient Medications  Medication Sig Dispense Refill  . ALPRAZolam (XANAX) 0.5 MG tablet Take 0.5 mg by mouth at bedtime as needed for sleep. Taken 1.5 daily    . celecoxib (CELEBREX) 200 MG capsule Take 200 mg by mouth daily.     . famotidine (PEPCID) 20 MG tablet Take 20 mg by mouth daily.    . hyoscyamine (LEVBID) 0.375 MG 12 hr tablet Take 0.375 mg by mouth daily. I/2 tab occasionally    . ibuprofen (ADVIL) 600 MG tablet Take 600 mg by mouth daily. 2 a day    . Lactobacillus (ACIDOPHILUS PO) Take 1 capsule by mouth daily.     . methylphenidate (RITALIN) 5 MG tablet Take 5 mg by mouth daily as needed for anxiety. Takes 5-6  per day    . omeprazole (PRILOSEC) 20 MG capsule Take 20 mg by mouth every morning.      No current facility-administered medications for this visit.    There are no problems to display for this patient.   Mental Status and Behavioral Observations  Jose [name] presented on time to the present encounter and was alert and generally oriented. Speech was normal in rate, rhythm, volume, and prosody. Self-reported mood was "not great" and affect was neutral to mildly dysphoric. Thought process was logical and goal oriented and thought  content was appropriate to the topics discussed. There were no safety concerns identified at today's encounter, such as thoughts of harming self or others.   Plan  Feedback provided regarding the patient's neuropsychological evaluation. He is primarily interested in finding a cause for his gait difficulties, which I suggested he follow up with Dr. Delice Lesch. He is taking thiamine supplementation as directed. Jose [name] was encouraged to contact me if any questions arise or if further follow up is desired.   Viviano Simas Nicole Kindred, PsyD, ABN Clinical Neuropsychologist  Service(s) Provided at This Encounter: 29 minutes [phone]; Psychotherapy with patient/family)

## 2020-05-17 ENCOUNTER — Inpatient Hospital Stay (HOSPITAL_COMMUNITY)
Admission: EM | Admit: 2020-05-17 | Discharge: 2020-05-25 | DRG: 640 | Disposition: A | Discharge status: Home Health | Payer: Medicare Other | Attending: Student | Admitting: Student

## 2020-05-17 ENCOUNTER — Emergency Department (HOSPITAL_COMMUNITY): Payer: Medicare Other

## 2020-05-17 ENCOUNTER — Encounter (HOSPITAL_COMMUNITY): Payer: Self-pay

## 2020-05-17 ENCOUNTER — Other Ambulatory Visit: Payer: Self-pay

## 2020-05-17 DIAGNOSIS — E512 Wernicke's encephalopathy: Secondary | ICD-10-CM | POA: Diagnosis not present

## 2020-05-17 DIAGNOSIS — G629 Polyneuropathy, unspecified: Secondary | ICD-10-CM | POA: Diagnosis present

## 2020-05-17 DIAGNOSIS — R270 Ataxia, unspecified: Secondary | ICD-10-CM

## 2020-05-17 DIAGNOSIS — Z20822 Contact with and (suspected) exposure to covid-19: Secondary | ICD-10-CM | POA: Diagnosis present

## 2020-05-17 DIAGNOSIS — R059 Cough, unspecified: Secondary | ICD-10-CM | POA: Diagnosis present

## 2020-05-17 DIAGNOSIS — R350 Frequency of micturition: Secondary | ICD-10-CM | POA: Diagnosis present

## 2020-05-17 DIAGNOSIS — G9341 Metabolic encephalopathy: Secondary | ICD-10-CM | POA: Diagnosis present

## 2020-05-17 DIAGNOSIS — M5030 Other cervical disc degeneration, unspecified cervical region: Secondary | ICD-10-CM | POA: Diagnosis present

## 2020-05-17 DIAGNOSIS — K219 Gastro-esophageal reflux disease without esophagitis: Secondary | ICD-10-CM | POA: Diagnosis present

## 2020-05-17 DIAGNOSIS — R5383 Other fatigue: Secondary | ICD-10-CM | POA: Diagnosis present

## 2020-05-17 DIAGNOSIS — M4312 Spondylolisthesis, cervical region: Secondary | ICD-10-CM | POA: Diagnosis present

## 2020-05-17 DIAGNOSIS — F1010 Alcohol abuse, uncomplicated: Secondary | ICD-10-CM | POA: Diagnosis present

## 2020-05-17 DIAGNOSIS — K589 Irritable bowel syndrome without diarrhea: Secondary | ICD-10-CM | POA: Diagnosis present

## 2020-05-17 DIAGNOSIS — M542 Cervicalgia: Secondary | ICD-10-CM

## 2020-05-17 DIAGNOSIS — G4733 Obstructive sleep apnea (adult) (pediatric): Secondary | ICD-10-CM | POA: Diagnosis present

## 2020-05-17 DIAGNOSIS — M1990 Unspecified osteoarthritis, unspecified site: Secondary | ICD-10-CM | POA: Diagnosis present

## 2020-05-17 DIAGNOSIS — E519 Thiamine deficiency, unspecified: Secondary | ICD-10-CM

## 2020-05-17 DIAGNOSIS — R509 Fever, unspecified: Secondary | ICD-10-CM | POA: Diagnosis present

## 2020-05-17 DIAGNOSIS — E871 Hypo-osmolality and hyponatremia: Secondary | ICD-10-CM | POA: Diagnosis present

## 2020-05-17 DIAGNOSIS — Z79899 Other long term (current) drug therapy: Secondary | ICD-10-CM

## 2020-05-17 DIAGNOSIS — R531 Weakness: Secondary | ICD-10-CM

## 2020-05-17 DIAGNOSIS — Z8249 Family history of ischemic heart disease and other diseases of the circulatory system: Secondary | ICD-10-CM

## 2020-05-17 DIAGNOSIS — G8929 Other chronic pain: Secondary | ICD-10-CM | POA: Diagnosis present

## 2020-05-17 DIAGNOSIS — K2970 Gastritis, unspecified, without bleeding: Secondary | ICD-10-CM | POA: Diagnosis present

## 2020-05-17 DIAGNOSIS — D72819 Decreased white blood cell count, unspecified: Secondary | ICD-10-CM | POA: Diagnosis present

## 2020-05-17 DIAGNOSIS — E876 Hypokalemia: Secondary | ICD-10-CM | POA: Diagnosis present

## 2020-05-17 DIAGNOSIS — Z87891 Personal history of nicotine dependence: Secondary | ICD-10-CM

## 2020-05-17 DIAGNOSIS — E785 Hyperlipidemia, unspecified: Secondary | ICD-10-CM | POA: Diagnosis present

## 2020-05-17 LAB — RESP PANEL BY RT-PCR (FLU A&B, COVID) ARPGX2
Influenza A by PCR: NEGATIVE
Influenza B by PCR: NEGATIVE
SARS Coronavirus 2 by RT PCR: NEGATIVE

## 2020-05-17 LAB — CBC
HCT: 42.8 % (ref 39.0–52.0)
Hemoglobin: 14.3 g/dL (ref 13.0–17.0)
MCH: 30.7 pg (ref 26.0–34.0)
MCHC: 33.4 g/dL (ref 30.0–36.0)
MCV: 91.8 fL (ref 80.0–100.0)
Platelets: 159 10*3/uL (ref 150–400)
RBC: 4.66 MIL/uL (ref 4.22–5.81)
RDW: 12.9 % (ref 11.5–15.5)
WBC: 4.3 10*3/uL (ref 4.0–10.5)
nRBC: 0 % (ref 0.0–0.2)

## 2020-05-17 LAB — COMPREHENSIVE METABOLIC PANEL
ALT: 18 U/L (ref 0–44)
AST: 30 U/L (ref 15–41)
Albumin: 3.7 g/dL (ref 3.5–5.0)
Alkaline Phosphatase: 54 U/L (ref 38–126)
Anion gap: 10 (ref 5–15)
BUN: 16 mg/dL (ref 8–23)
CO2: 23 mmol/L (ref 22–32)
Calcium: 9.1 mg/dL (ref 8.9–10.3)
Chloride: 101 mmol/L (ref 98–111)
Creatinine, Ser: 0.76 mg/dL (ref 0.61–1.24)
GFR, Estimated: >60 mL/min (ref >60)
Glucose, Bld: 93 mg/dL (ref 70–99)
Potassium: 4 mmol/L (ref 3.5–5.1)
Sodium: 134 mmol/L — ABNORMAL LOW (ref 135–145)
Total Bilirubin: 0.9 mg/dL (ref 0.3–1.2)
Total Protein: 6.8 g/dL (ref 6.5–8.1)

## 2020-05-17 LAB — URINALYSIS, ROUTINE W REFLEX MICROSCOPIC
Bilirubin Urine: NEGATIVE
Glucose, UA: NEGATIVE mg/dL
Hgb urine dipstick: NEGATIVE
Ketones, ur: 80 mg/dL — AB
Leukocytes,Ua: NEGATIVE
Nitrite: NEGATIVE
Protein, ur: NEGATIVE mg/dL
Specific Gravity, Urine: 1.018 (ref 1.005–1.030)
pH: 8 (ref 5.0–8.0)

## 2020-05-17 LAB — LACTIC ACID, PLASMA: Lactic Acid, Venous: 1.1 mmol/L (ref 0.5–1.9)

## 2020-05-17 MED ORDER — SODIUM CHLORIDE 0.9 % IV BOLUS (SEPSIS)
1000.0000 mL | Freq: Once | INTRAVENOUS | Status: AC
  Administered 2020-05-17: 1000 mL via INTRAVENOUS

## 2020-05-17 MED ORDER — ACETAMINOPHEN 325 MG PO TABS
650.0000 mg | ORAL_TABLET | Freq: Once | ORAL | Status: AC
  Administered 2020-05-17: 650 mg via ORAL
  Filled 2020-05-17: qty 2

## 2020-05-17 MED ORDER — SODIUM CHLORIDE 0.9 % IV SOLN
1000.0000 mL | INTRAVENOUS | Status: DC
  Administered 2020-05-17 – 2020-05-18 (×2): 1000 mL via INTRAVENOUS

## 2020-05-17 NOTE — ED Provider Notes (Signed)
[facility] DEPT Provider Note   CSN: [id number] Arrival date & time: 05/17/20  1921     History Chief Complaint  Patient presents with  . Weakness    Jose [name] is a 85 y.o. male.  HPI   Patient presents to the ED for evaluation of weakness.  Patient himself says he has a history of chronic fatigue syndrome that has been causing this fatigue for him for many years.  Patient does not think there has been any any significant change over the last several days.  When I asked him what brought him into the ED he states that his nevic and other made him come in.  According to the EMS report patient's had generalized weakness over the last 4 days and was have difficulty ambulating at home.  Patient denies that to me and states he has been able to walk.  He denies any recent falls.  He was not aware of any recent fevers.  He has had some urinary frequency.  Denies any vomiting or diarrhea.  No cough.  No sore throat.  No chest pain  Family provides additional history and that today the patient was very weak.  He was not even able to get up. Past Medical History:  Diagnosis Date  . Acute meniscal tear of knee    RIGHT KNEE  . Arthritis   . Hives    PER PT UNKNOWN WHY  . IBS (irritable bowel syndrome)   . Mild sleep apnea    PER STUDY  11-30-2010--  NO CPAP  . Spinal stenosis     There are no problems to display for this patient.   Past Surgical History:  Procedure Laterality Date  . APPENDECTOMY    . KNEE ARTHROSCOPY WITH LATERAL MENISECTOMY Right 04/12/2012   Procedure: RIGHT KNEE ARTHROSCOPY WITH PARTIAL MEDIAL AND LATERAL MENISECTOMY;  Surgeon: Magnus Sinning, MD;  Location: [facility];  Service: Orthopedics;  Laterality: Right;  . SHOULDER ARTHROSCOPY WITH ROTATOR CUFF REPAIR AND SUBACROMIAL DECOMPRESSION  12-21-1999  &  01-28-2004       Family History  Problem Relation Age of Onset  . Heart disease Mother   .  Heart disease Father     Social History   Tobacco Use  . Smoking status: Former Smoker    Years: 17.00    Types: Cigarettes  . Smokeless tobacco: Never Used  . Tobacco comment: QUIT SMOKING 40 YRS AGO  Vaping Use  . Vaping Use: Never used  Substance Use Topics  . Alcohol use: Yes    Comment: ONE BOTTLE WINE PER DAY  . Drug use: No    Home Medications Prior to Admission medications   Medication Sig Start Date End Date Taking? Authorizing Provider  ALPRAZolam Duanne Moron) 0.5 MG tablet Take 0.5 mg by mouth at bedtime as needed for sleep. Taken 1.5 daily 08/16/18   [provider]  celecoxib (CELEBREX) 200 MG capsule Take 200 mg by mouth daily.     [provider]  famotidine (PEPCID) 20 MG tablet Take 20 mg by mouth daily.    [provider]  hyoscyamine (LEVBID) 0.375 MG 12 hr tablet Take 0.375 mg by mouth daily. I/2 tab occasionally    [provider]  ibuprofen (ADVIL) 600 MG tablet Take 600 mg by mouth daily. 2 a day    [provider]  Lactobacillus (ACIDOPHILUS PO) Take 1 capsule by mouth daily.     [provider]  methylphenidate (RITALIN) 5 MG tablet Take 5 mg by mouth daily as needed for anxiety. Takes 5-6  per day 07/19/18   [provider]  omeprazole (PRILOSEC) 20 MG capsule Take 20 mg by mouth every morning.     [provider]    Allergies    Patient has no known allergies.  Review of Systems   Review of Systems  All other systems reviewed and are negative.   Physical Exam Updated Vital Signs BP (!) 152/69   Pulse 64   Temp 97.8 F (36.6 C) (Oral)   Resp (!) 26   SpO2 97%   Physical Exam Vitals and nursing note reviewed.  Constitutional:      General: He is not in acute distress.    Appearance: He is well-developed.  HENT:     Head: Normocephalic and atraumatic.     Right Ear: External ear normal.     Left Ear: External ear normal.  Eyes:     General: No scleral icterus.        Right eye: No discharge.        Left eye: No discharge.     Conjunctiva/sclera: Conjunctivae normal.  Neck:     Trachea: No tracheal deviation.  Cardiovascular:     Rate and Rhythm: Normal rate and regular rhythm.  Pulmonary:     Effort: Pulmonary effort is normal. No respiratory distress.     Breath sounds: Normal breath sounds. No stridor. No wheezing or rales.  Abdominal:     General: Bowel sounds are normal. There is no distension.     Palpations: Abdomen is soft.     Tenderness: There is no abdominal tenderness. There is no guarding or rebound.  Musculoskeletal:        General: No tenderness.     Cervical back: Neck supple.  Skin:    General: Skin is warm and dry.     Findings: No rash.  Neurological:     Mental Status: He is alert and oriented to person, place, and time.     Cranial Nerves: No cranial nerve deficit (No facial droop, extraocular movements intact, tongue midline ).     Sensory: No sensory deficit.     Motor: No abnormal muscle tone or seizure activity.     Coordination: Coordination normal.     Comments: No pronator drift bilateral upper extrem, able to hold both legs off bed for 5 seconds, sensation intact in all extremities, no visual field cuts, no left or right sided neglect, no nystagmus noted Patient however not able to ambulate     ED Results / Procedures / Treatments   Labs (all labs ordered are listed, but only abnormal results are displayed) Labs Reviewed  COMPREHENSIVE METABOLIC PANEL - Abnormal; Notable for the following components:      Result Value   Sodium 134 (*)    All other components within normal limits  URINALYSIS, ROUTINE W REFLEX MICROSCOPIC - Abnormal; Notable for the following components:   Ketones, ur 80 (*)    All other components within normal limits  RESP PANEL BY RT-PCR (FLU A&B, COVID) ARPGX2  CULTURE, BLOOD (ROUTINE X 2)  CULTURE, BLOOD (ROUTINE X 2)  CBC  LACTIC ACID, PLASMA  LACTIC ACID, PLASMA  ETHANOL     EKG None  Radiology CT Head Wo Contrast  Result Date: 05/17/2020 CLINICAL DATA:  Chronic shoulder and knee pain with weakness. EXAM: CT HEAD WITHOUT CONTRAST TECHNIQUE: Contiguous axial images were obtained from the  base of the skull through the vertex without intravenous contrast. COMPARISON:  MRI brain 03/19/2020.  CT head 08/29/2018 FINDINGS: Brain: No evidence of acute infarction, hemorrhage, hydrocephalus, extra-axial collection or mass lesion/mass effect. Diffuse cerebral atrophy. Ventricular dilatation consistent with central atrophy. Low-attenuation changes in the deep white matter consistent with small vessel ischemia. Vascular: Moderate intracranial arterial vascular calcifications. Skull: The calvarium appears intact. Sinuses/Orbits: Paranasal sinuses and mastoid air cells are clear. Other: None. IMPRESSION: 1. No acute intracranial abnormalities. 2. Chronic atrophy and small vessel ischemic changes. Electronically Signed   By: Lucienne Capers M.D.   On: 05/17/2020 23:41   DG Chest Portable 1 View  Result Date: 05/17/2020 CLINICAL DATA:  Increased generalized weakness over past 4 days. Today pt was unable to ambulate at home. Former smoker. weakness EXAM: PORTABLE CHEST 1 VIEW COMPARISON:  None. FINDINGS: Normal mediastinum and cardiac silhouette. Normal pulmonary vasculature. No evidence of effusion, infiltrate, or pneumothorax. No acute bony abnormality. IMPRESSION: No acute cardiopulmonary process. Electronically Signed   By: Suzy Bouchard M.D.   On: 05/17/2020 20:57    Procedures Procedures   Medications Ordered in ED Medications  sodium chloride 0.9 % bolus 1,000 mL (0 mLs Intravenous Stopped 05/17/20 2120)    Followed by  0.9 %  sodium chloride infusion (1,000 mLs Intravenous New Bag/Given 05/17/20 2057)  acetaminophen (TYLENOL) tablet 650 mg (650 mg Oral Given 05/17/20 2232)    ED Course  I have reviewed the triage vital signs and the nursing notes.  Pertinent labs  & imaging results that were available during my care of the patient were reviewed by me and considered in my medical decision making (see chart for details).  Clinical Course as of 05/17/20 2352  Mon May 17, 2020  2216 Urinalysis negative.  CBC and metabolic panel normal.  COVID and influenza negative [JK]  2307 Pt attempted to ambulate.  Unable to do so. [JK]    Clinical Course User Index [JK] Dorie Rank, MD   MDM Rules/Calculators/A&P                         Patient presented to the ED for evaluation of weakness.  Patient has low-grade temperature here but no signs of urinary tract infection pneumonia COVID or influenza.  Patient describes progressive gait disturbances.  He has seen one of our neurology and is diagnosed with neurocognitive disorder.  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  It is possible this could be contributing to some of his gait disturbance and weakness.  However this evening the patient is not able to safely ambulate.  He was unable to do so.Marland Kitchen  He has had an MRI back in February but I think it is reasonable to have that done in the morning.  I will consult the medical service for admission for further evaluation Final Clinical Impression(s) / ED Diagnoses Final diagnoses:  Ataxia  Weakness      Dorie Rank, MD 05/17/20 2356

## 2020-05-17 NOTE — ED Triage Notes (Signed)
Pt arrives from home via EMS. Increased generalized weakness over 4 days. Today pt was unable to ambulate at home. No s/s of illness. No reported difficulty urinating.

## 2020-05-17 NOTE — ED Notes (Signed)
Patient transported to CT 

## 2020-05-17 NOTE — ED Notes (Signed)
Pt got out of bed unassisted and was unstable. Staff assisted pt back into bed. Bed alarm turned on. Call bell within reach.

## 2020-05-17 NOTE — ED Notes (Addendum)
Pt unable to get out of bed to ambulate or sit upright to use urinal. Nurse and MD notified.

## 2020-05-18 ENCOUNTER — Observation Stay (HOSPITAL_COMMUNITY): Payer: Medicare Other

## 2020-05-18 DIAGNOSIS — R270 Ataxia, unspecified: Secondary | ICD-10-CM

## 2020-05-18 DIAGNOSIS — E519 Thiamine deficiency, unspecified: Secondary | ICD-10-CM

## 2020-05-18 LAB — CBC WITH DIFFERENTIAL/PLATELET
Abs Immature Granulocytes: 0.01 10*3/uL (ref 0.00–0.07)
Abs Immature Granulocytes: 0.02 10*3/uL (ref 0.00–0.07)
Basophils Absolute: 0 10*3/uL (ref 0.0–0.1)
Basophils Absolute: 0 10*3/uL (ref 0.0–0.1)
Basophils Relative: 0 %
Basophils Relative: 0 %
Eosinophils Absolute: 0 10*3/uL (ref 0.0–0.5)
Eosinophils Absolute: 0 10*3/uL (ref 0.0–0.5)
Eosinophils Relative: 0 %
Eosinophils Relative: 1 %
HCT: 39.9 % (ref 39.0–52.0)
HCT: 39.1 % (ref 39.0–52.0)
Hemoglobin: 13.5 g/dL (ref 13.0–17.0)
Hemoglobin: 13.1 g/dL (ref 13.0–17.0)
Immature Granulocytes: 0 %
Immature Granulocytes: 1 %
Lymphocytes Relative: 15 %
Lymphocytes Relative: 22 %
Lymphs Abs: 0.4 10*3/uL — ABNORMAL LOW (ref 0.7–4.0)
Lymphs Abs: 0.6 10*3/uL — ABNORMAL LOW (ref 0.7–4.0)
MCH: 31.3 pg (ref 26.0–34.0)
MCH: 30.6 pg (ref 26.0–34.0)
MCHC: 33.8 g/dL (ref 30.0–36.0)
MCHC: 33.5 g/dL (ref 30.0–36.0)
MCV: 92.4 fL (ref 80.0–100.0)
MCV: 91.4 fL (ref 80.0–100.0)
Monocytes Absolute: 0.7 10*3/uL (ref 0.1–1.0)
Monocytes Absolute: 0.8 10*3/uL (ref 0.1–1.0)
Monocytes Relative: 26 %
Monocytes Relative: 28 %
Neutro Abs: 1.5 10*3/uL — ABNORMAL LOW (ref 1.7–7.7)
Neutro Abs: 1.3 10*3/uL — ABNORMAL LOW (ref 1.7–7.7)
Neutrophils Relative %: 59 %
Neutrophils Relative %: 48 %
Platelets: 157 10*3/uL (ref 150–400)
Platelets: 153 10*3/uL (ref 150–400)
RBC: 4.32 MIL/uL (ref 4.22–5.81)
RBC: 4.28 MIL/uL (ref 4.22–5.81)
RDW: 12.9 % (ref 11.5–15.5)
RDW: 13 % (ref 11.5–15.5)
WBC: 2.7 10*3/uL — ABNORMAL LOW (ref 4.0–10.5)
WBC: 2.8 10*3/uL — ABNORMAL LOW (ref 4.0–10.5)
nRBC: 0 % (ref 0.0–0.2)
nRBC: 0 % (ref 0.0–0.2)

## 2020-05-18 LAB — BASIC METABOLIC PANEL
Anion gap: 5 (ref 5–15)
Anion gap: 5 (ref 5–15)
BUN: 14 mg/dL (ref 8–23)
BUN: 15 mg/dL (ref 8–23)
CO2: 24 mmol/L (ref 22–32)
CO2: 24 mmol/L (ref 22–32)
Calcium: 8.4 mg/dL — ABNORMAL LOW (ref 8.9–10.3)
Calcium: 8.7 mg/dL — ABNORMAL LOW (ref 8.9–10.3)
Chloride: 107 mmol/L (ref 98–111)
Chloride: 107 mmol/L (ref 98–111)
Creatinine, Ser: 0.78 mg/dL (ref 0.61–1.24)
Creatinine, Ser: 0.71 mg/dL (ref 0.61–1.24)
GFR, Estimated: >60 mL/min (ref >60)
GFR, Estimated: >60 mL/min (ref >60)
Glucose, Bld: 160 mg/dL — ABNORMAL HIGH (ref 70–99)
Glucose, Bld: 132 mg/dL — ABNORMAL HIGH (ref 70–99)
Potassium: 3.3 mmol/L — ABNORMAL LOW (ref 3.5–5.1)
Potassium: 3.5 mmol/L (ref 3.5–5.1)
Sodium: 136 mmol/L (ref 135–145)
Sodium: 136 mmol/L (ref 135–145)

## 2020-05-18 LAB — VITAMIN B12: Vitamin B-12: 1127 pg/mL — ABNORMAL HIGH (ref 180–914)

## 2020-05-18 LAB — ETHANOL: Alcohol, Ethyl (B): <10 mg/dL (ref <10)

## 2020-05-18 LAB — FOLATE: Folate: 14.2 ng/mL (ref >5.9)

## 2020-05-18 LAB — MAGNESIUM: Magnesium: 2.1 mg/dL (ref 1.7–2.4)

## 2020-05-18 MED ORDER — LORAZEPAM 1 MG PO TABS
1.0000 mg | ORAL_TABLET | ORAL | Status: DC | PRN
  Administered 2020-05-19: 2 mg via ORAL

## 2020-05-18 MED ORDER — LORAZEPAM 2 MG/ML IJ SOLN
1.0000 mg | INTRAMUSCULAR | Status: DC | PRN
  Administered 2020-05-18: 2 mg via INTRAVENOUS
  Administered 2020-05-19 (×2): 1 mg via INTRAVENOUS
  Administered 2020-05-20: 2 mg via INTRAVENOUS
  Filled 2020-05-18 (×5): qty 1

## 2020-05-18 MED ORDER — THIAMINE HCL 100 MG PO TABS
250.0000 mg | ORAL_TABLET | Freq: Every day | ORAL | Status: DC
  Filled 2020-05-18 (×2): qty 3

## 2020-05-18 MED ORDER — THIAMINE HCL 100 MG/ML IJ SOLN
500.0000 mg | Freq: Three times a day (TID) | INTRAVENOUS | Status: AC
  Administered 2020-05-18 – 2020-05-19 (×6): 500 mg via INTRAVENOUS
  Filled 2020-05-18 (×7): qty 5

## 2020-05-18 MED ORDER — ACETAMINOPHEN 650 MG RE SUPP: 650.0000 mg | Freq: Four times a day (QID) | RECTAL | Status: DC | PRN

## 2020-05-18 MED ORDER — ADULT MULTIVITAMIN W/MINERALS CH
1.0000 | ORAL_TABLET | Freq: Every day | ORAL | Status: DC
  Administered 2020-05-18 – 2020-05-25 (×8): 1 via ORAL
  Filled 2020-05-18 (×8): qty 1

## 2020-05-18 MED ORDER — ACETAMINOPHEN 325 MG PO TABS: 650.0000 mg | ORAL_TABLET | Freq: Four times a day (QID) | ORAL | Status: DC | PRN

## 2020-05-18 MED ORDER — LABETALOL HCL 5 MG/ML IV SOLN
10.0000 mg | INTRAVENOUS | Status: DC | PRN
  Administered 2020-05-19: 10 mg via INTRAVENOUS
  Filled 2020-05-18: qty 4

## 2020-05-18 MED ORDER — ENOXAPARIN SODIUM 40 MG/0.4ML ~~LOC~~ SOLN
40.0000 mg | SUBCUTANEOUS | Status: DC
  Administered 2020-05-18 – 2020-05-24 (×7): 40 mg via SUBCUTANEOUS
  Filled 2020-05-18 (×8): qty 0.4

## 2020-05-18 MED ORDER — THIAMINE HCL 100 MG/ML IJ SOLN: 100.0000 mg | Freq: Every day | INTRAMUSCULAR | Status: DC

## 2020-05-18 MED ORDER — SODIUM CHLORIDE 0.9% FLUSH
3.0000 mL | Freq: Two times a day (BID) | INTRAVENOUS | Status: DC
  Administered 2020-05-18 – 2020-05-25 (×11): 3 mL via INTRAVENOUS

## 2020-05-18 MED ORDER — FOLIC ACID 1 MG PO TABS
1.0000 mg | ORAL_TABLET | Freq: Every day | ORAL | Status: DC
  Administered 2020-05-18 – 2020-05-25 (×7): 1 mg via ORAL
  Filled 2020-05-18 (×8): qty 1

## 2020-05-18 NOTE — Progress Notes (Signed)
Chaplain engaged in an initial visit with Jose [name] and his friend.  Jose [name] shared about his experience in the emergency room and how it felt like being in a "dungeon."  He also expressed some aggravation around still being in the hospital because of a vitamin deficiency.  Jose [name] did not share about any other medical issues or concerns.  He expressed his concerns with laughter and in jest, but also presented a readiness to go home.  He could not understand why he was being held overnight.    Sandy's friend noted that he was unable to walk which is what brought him into the hospital.  She also saw him getting weaker and weaker.   Chaplain offered support, listening, and presence.  Chaplain will follow-up.    05/18/20 1600  Clinical Encounter Type  Visited With Patient and family together  Visit Type Initial

## 2020-05-18 NOTE — H&P (Signed)
History and Physical    Jose [name]  [id number]  DOB: 23-Nov-1935  DOA: 05/17/2020  PCP: Jilda Panda, MD Patient coming from: home  Chief Complaint: home  HPI:  Jose [name] is an 85 yo male with PMH ongoing alcohol abuse, thiamine deficiency, arthritis, HLD, IBS, sleep apnea, spinal stenosis, chronic fatigue who presented to the ER with complaints of ongoing ataxia. He was also highly encouraged by family to come to the ER as well.   He has had extensive outpatient evaluation by neurology including NCV with EMG on 04/14/20 (study noted chronic sensorimotor axonal polyneuropathy affecting the lower extremities). He was also seen by neurology on 02/27/2020.  It was reported at that time he was also having difficulty with memory recall in addition to maintaining his balance. He was ordered to undergo further testing at that visit including MRI brain as well as vitamin B1 level and the aforementioned EMG testing. His B1 level has since returned and was 7 nmol/L on 02/27/20. His B12 level was 858.  He was started on oral B1 replacement and told to start cutting back on his drinking.   Tonight, he endorses that he continues to drink approximately 1/5 of alcohol daily, mostly red wine but states he also still drinks vodka and beer at times.  He became rather offended when told alcohol was largely contributing to his overall symptoms and in somewhat disbelief.  Due to his ongoing complaints of ataxia, he is admitted for MRI brain and further workup.   I have personally briefly reviewed patient's old medical records in [facility] and discussed patient with the ER provider when appropriate/indicated.  Assessment/Plan:  Ataxia - evaluated by neurology previously and found to have chronic sensorimotor axonal polyneuropathy affecting the lower extremities on EMG on 04/14/20; also noted to have memory impairment in the past as well. Thiamine level is 7 and he was started on oral  replacement but continues to drink 1/5 alcohol daily if not more - largely suspect his ongoing symptoms are due to prolonged and severe B1 deficiency (patient gets offended when told he needs to "stop drinking" and says his other doctors told him he only needed to "cut back", which he has not done so regardless) - obtain MRI brain to evaluate for cerebellar stroke - recheck B1 and B12 levels; check folate as well (can contribute to weakness) - start high dose IV thiamine while hospitalized - follow up PT eval (patient already insinuating he will decline evaluation)  Alcohol abuse - Patient reports ongoing daily drinking of approximately 1/5 of alcohol, mostly wine - Currently no signs/symptoms of withdrawal - Monitor on CIWA while hospitalized  Thiamine deficiency - see ataxia - continue IV replacement  Chronic fatigue syndrome - Hold home Ritalin  Code Status:    Code Status: Full Code  DVT Prophylaxis:   enoxaparin (LOVENOX) injection 40 mg Start: 05/18/20 1000   Anticipated disposition is to: home  History: Past Medical History:  Diagnosis Date  . Acute meniscal tear of knee    RIGHT KNEE  . Arthritis   . Hives    PER PT UNKNOWN WHY  . IBS (irritable bowel syndrome)   . Mild sleep apnea    PER STUDY  11-30-2010--  NO CPAP  . Spinal stenosis     Past Surgical History:  Procedure Laterality Date  . APPENDECTOMY    . KNEE ARTHROSCOPY WITH LATERAL MENISECTOMY Right 04/12/2012   Procedure: RIGHT KNEE ARTHROSCOPY WITH PARTIAL MEDIAL AND LATERAL MENISECTOMY;  Surgeon: Magnus Sinning, MD;  Location: [facility];  Service: Orthopedics;  Laterality: Right;  . SHOULDER ARTHROSCOPY WITH ROTATOR CUFF REPAIR AND SUBACROMIAL DECOMPRESSION  12-21-1999  &  01-28-2004     reports that he has quit smoking. His smoking use included cigarettes. He quit after 17.00 years of use. He has never used smokeless tobacco. He reports current alcohol use. He reports that he does  not use drugs.  No Known Allergies  Family History  Problem Relation Age of Onset  . Heart disease Mother   . Heart disease Father    Home Medications: Prior to Admission medications   Medication Sig Start Date End Date Taking? Authorizing Provider  ALPRAZolam Duanne Moron) 0.5 MG tablet Take 0.5 mg by mouth at bedtime as needed for sleep. Taken 1.5 daily 08/16/18  Yes [provider]  celecoxib (CELEBREX) 200 MG capsule Take 200 mg by mouth daily.    Yes [provider]  famotidine (PEPCID) 20 MG tablet Take 20 mg by mouth daily.   Yes [provider]  hyoscyamine (LEVBID) 0.375 MG 12 hr tablet Take 0.375 mg by mouth daily. I/2 tab occasionally   Yes [provider]  ibuprofen (ADVIL) 600 MG tablet Take 600 mg by mouth daily. 2 a day   Yes [provider]  Lactobacillus (ACIDOPHILUS PO) Take 1 capsule by mouth daily.    Yes [provider]  methylphenidate (RITALIN) 5 MG tablet Take 5 mg by mouth daily as needed for anxiety. Takes 5-6  per day 07/19/18  Yes [provider]  omeprazole (PRILOSEC) 20 MG capsule Take 20 mg by mouth every morning.    Yes [provider]    Review of Systems:  Pertinent items noted in HPI and remainder of comprehensive ROS otherwise negative.  Physical Exam: Vitals:   05/17/20 2245 05/17/20 2346 05/17/20 2349 05/18/20 0045  BP: (!) 152/69  127/74 131/82  Pulse: 64  (!) 59 (!) 53  Resp: (!) 26  (!) 23 (!) 21  Temp:  97.8 F (36.6 C)    TempSrc:  Oral    SpO2: 97%  96% 96%   General appearance: alert, cooperative, no distress and easily agitated Head: Normocephalic, without obvious abnormality, atraumatic Eyes: EOMI Lungs: clear to auscultation bilaterally Heart: regular rate and rhythm and S1, S2 normal Abdomen: normal findings: bowel sounds normal and soft, non-tender Extremities: no edema Skin: warm, dry, intact Neurologic: strength 4/5 in LE; denies paresthesias  Labs on  Admission:  I have personally reviewed following labs and imaging studies Results for orders placed or performed during the hospital encounter of 05/17/20 (from the past 24 hour(s))  CBC     Status: None   Collection Time: 05/17/20  8:00 PM  Result Value Ref Range   WBC 4.3 4.0 - 10.5 K/uL   RBC 4.66 4.22 - 5.81 MIL/uL   Hemoglobin 14.3 13.0 - 17.0 g/dL   HCT 42.8 39.0 - 52.0 %   MCV 91.8 80.0 - 100.0 fL   MCH 30.7 26.0 - 34.0 pg   MCHC 33.4 30.0 - 36.0 g/dL   RDW 12.9 11.5 - 15.5 %   Platelets 159 150 - 400 K/uL   nRBC 0.0 0.0 - 0.2 %  Comprehensive metabolic panel     Status: Abnormal   Collection Time: 05/17/20  8:00 PM  Result Value Ref Range   Sodium 134 (L) 135 - 145 mmol/L   Potassium 4.0 3.5 - 5.1 mmol/L  Chloride 101 98 - 111 mmol/L   CO2 23 22 - 32 mmol/L   Glucose, Bld 93 70 - 99 mg/dL   BUN 16 8 - 23 mg/dL   Creatinine, Ser 0.76 0.61 - 1.24 mg/dL   Calcium 9.1 8.9 - 10.3 mg/dL   Total Protein 6.8 6.5 - 8.1 g/dL   Albumin 3.7 3.5 - 5.0 g/dL   AST 30 15 - 41 U/L   ALT 18 0 - 44 U/L   Alkaline Phosphatase 54 38 - 126 U/L   Total Bilirubin 0.9 0.3 - 1.2 mg/dL   GFR, Estimated >60 >60 mL/min   Anion gap 10 5 - 15  Urinalysis, Routine w reflex microscopic Urine, Clean Catch     Status: Abnormal   Collection Time: 05/17/20  8:27 PM  Result Value Ref Range   Color, Urine YELLOW YELLOW   APPearance CLEAR CLEAR   Specific Gravity, Urine 1.018 1.005 - 1.030   pH 8.0 5.0 - 8.0   Glucose, UA NEGATIVE NEGATIVE mg/dL   Hgb urine dipstick NEGATIVE NEGATIVE   Bilirubin Urine NEGATIVE NEGATIVE   Ketones, ur 80 (A) NEGATIVE mg/dL   Protein, ur NEGATIVE NEGATIVE mg/dL   Nitrite NEGATIVE NEGATIVE   Leukocytes,Ua NEGATIVE NEGATIVE  Lactic acid, plasma     Status: None   Collection Time: 05/17/20  8:29 PM  Result Value Ref Range   Lactic Acid, Venous 1.1 0.5 - 1.9 mmol/L  Resp Panel by RT-PCR (Flu A&B, Covid) Nasopharyngeal Swab     Status: None   Collection Time: 05/17/20   9:06 PM   Specimen: Nasopharyngeal Swab; Nasopharyngeal(NP) swabs in vial transport medium  Result Value Ref Range   SARS Coronavirus 2 by RT PCR NEGATIVE NEGATIVE   Influenza A by PCR NEGATIVE NEGATIVE   Influenza B by PCR NEGATIVE NEGATIVE  Ethanol     Status: None   Collection Time: 05/17/20 11:07 PM  Result Value Ref Range   Alcohol, Ethyl (B) <10 <10 mg/dL     Radiological Exams on Admission: CT Head Wo Contrast  Result Date: 05/17/2020 CLINICAL DATA:  Chronic shoulder and knee pain with weakness. EXAM: CT HEAD WITHOUT CONTRAST TECHNIQUE: Contiguous axial images were obtained from the base of the skull through the vertex without intravenous contrast. COMPARISON:  MRI brain 03/19/2020.  CT head 08/29/2018 FINDINGS: Brain: No evidence of acute infarction, hemorrhage, hydrocephalus, extra-axial collection or mass lesion/mass effect. Diffuse cerebral atrophy. Ventricular dilatation consistent with central atrophy. Low-attenuation changes in the deep white matter consistent with small vessel ischemia. Vascular: Moderate intracranial arterial vascular calcifications. Skull: The calvarium appears intact. Sinuses/Orbits: Paranasal sinuses and mastoid air cells are clear. Other: None. IMPRESSION: 1. No acute intracranial abnormalities. 2. Chronic atrophy and small vessel ischemic changes. Electronically Signed   By: Lucienne Capers M.D.   On: 05/17/2020 23:41   DG Chest Portable 1 View  Result Date: 05/17/2020 CLINICAL DATA:  Increased generalized weakness over past 4 days. Today pt was unable to ambulate at home. Former smoker. weakness EXAM: PORTABLE CHEST 1 VIEW COMPARISON:  None. FINDINGS: Normal mediastinum and cardiac silhouette. Normal pulmonary vasculature. No evidence of effusion, infiltrate, or pneumothorax. No acute bony abnormality. IMPRESSION: No acute cardiopulmonary process. Electronically Signed   By: Suzy Bouchard M.D.   On: 05/17/2020 20:57   CT Head Wo Contrast  Final Result     DG Chest Portable 1 View  Final Result    MR BRAIN WO CONTRAST    (Results Pending)  Consults called:  n/a    Dwyane Dee, MD [facility] 05/18/2020, 2:16 AM

## 2020-05-18 NOTE — ED Notes (Signed)
Patient transported to MRI 

## 2020-05-18 NOTE — ED Notes (Signed)
Pt asleep VSS

## 2020-05-18 NOTE — Evaluation (Signed)
Physical Therapy Evaluation Patient Details Name: Jose [name] MRN: [id number] DOB: 08-12-1935 Today's Date: 05/18/2020   History of Present Illness  Jose [name] is an 85 yo male with PMH ongoing alcohol abuse, thiamine deficiency, arthritis, HLD, IBS, sleep apnea, spinal stenosis, chronic fatigue who presented to the ER with complaints of ongoing ataxia.  Clinical Impression  Initially patient stated"I do not need PT. I can walk. I am being picked up soon.  PT asked RN to assist with patient compliance to participate. Patient  Did mobilize, required mod assistance to sit up on bed edge, noted posterior leaniing. Gradually improved to sitting forward. Patient ambulated x ` 20' using Rw, wide base and  RW out in front. Patient  Let go of Rw  And took a few steps, noted ataxia and unsteady gait. Patient continued to state that he was going home and was being held against his will.  Pt admitted with above diagnosis. Pt currently with functional limitations due to the deficits listed below (see PT Problem List). Pt will benefit from skilled PT to increase their independence and safety with mobility to allow discharge to the venue listed below.    patient refused to answer basic orintation questions stating' I have been asked that before and I am not answering."     Follow Up Recommendations Home health PT;SNF depends  If patient compliant with using a RW and has assistance.    Equipment Recommendations  Rolling walker with 5" wheels    Recommendations for Other Services       Precautions / Restrictions Precautions Precautions: Fall      Mobility  Bed Mobility Overal bed mobility: Needs Assistance Bed Mobility: Supine to Sit;Sit to Supine     Supine to sit: Min assist Sit to supine: Min guard   General bed mobility comments: extra time to initiaite, posterior bias when sitting, noted tremors when attempting to scoot self to bed edge.    Transfers Overall transfer  level: Needs assistance Equipment used: Rolling walker (2 wheeled) Transfers: Sit to/from Stand Sit to Stand: Mod assist         General transfer comment: steady assist to stand, wide base and ataxia noted., cues for safety for use of RW. Patient did not want to use it but instructed patient to use it.  Ambulation/Gait Ambulation/Gait assistance: Mod assist Gait Distance (Feet): 20 Feet Assistive device: Rolling walker (2 wheeled) Gait Pattern/deviations: Wide base of support;Ataxic;Decreased stride length Gait velocity: decreased,   General Gait Details: patient taking small and shuffling steps, placing RW far out in front, multimodal cues for safety. patient letting go of RW frequently stating" I don't need that."  Stairs            Wheelchair Mobility    Modified Rankin (Stroke Patients Only)       Balance Overall balance assessment: Needs assistance Sitting-balance support: Bilateral upper extremity supported;Feet supported Sitting balance-Leahy Scale: Poor Sitting balance - Comments: posterior bias, gradually able to come forward but still with a posterior tilting  of trunk   Standing balance support: During functional activity;No upper extremity supported Standing balance-Leahy Scale: Poor Standing balance comment: wide base, trmors/ataxia of legs                             Pertinent Vitals/Pain Pain Assessment: Faces Faces Pain Scale: No hurt    Home Living Family/patient expects to be discharged to:: Private residence Living Arrangements: Spouse/significant  other Available Help at Discharge: Family;Available PRN/intermittently (\) Type of Home: House Home Access: Stairs to enter   Entrance Stairs-Number of Steps: 1 Home Layout: One level (with basement) Home Equipment: None      Prior Function Level of Independence: Independent               Hand Dominance   Dominant Hand: Right    Extremity/Trunk Assessment         Lower Extremity Assessment Lower Extremity Assessment: Generalized weakness (noted ataxic movements)    Cervical / Trunk Assessment Cervical / Trunk Assessment: Normal  Communication   Communication: No difficulties  Cognition Arousal/Alertness: Awake/alert Behavior During Therapy: Agitated Overall Cognitive Status: No family/caregiver present to determine baseline cognitive functioning Area of Impairment: Orientation;Attention;Following commands;Safety/judgement                 Orientation Level: Situation Current Attention Level: Sustained   Following Commands: Follows one step commands consistently Safety/Judgement: Decreased awareness of deficits;Decreased awareness of safety     General Comments: patient stated he  did not ned PT, that he can walk.. RN in to convince patient to participate. Patient refused to answer  orientation questions" I have been asked 100 times.:      General Comments      Exercises     Assessment/Plan    PT Assessment Patient needs continued PT services  PT Problem List Decreased strength;Decreased mobility;Decreased safety awareness;Decreased knowledge of precautions;Decreased coordination;Decreased activity tolerance;Decreased cognition;Decreased balance       PT Treatment Interventions DME instruction;Therapeutic activities;Cognitive remediation;Gait training;Therapeutic exercise;Patient/family education;Functional mobility training;Balance training    PT Goals (Current goals can be found in the Care Plan section)  Acute Rehab PT Goals Patient Stated Goal: I am going home today PT Goal Formulation: Patient unable to participate in goal setting Time For Goal Achievement: 06/01/20 Potential to Achieve Goals: Fair    Frequency Min 2X/week   Barriers to discharge        Co-evaluation               AM-PAC PT "6 Clicks" Mobility  Outcome Measure Help needed turning from your back to your side while in a flat bed without  using bedrails?: A Lot Help needed moving from lying on your back to sitting on the side of a flat bed without using bedrails?: A Lot Help needed moving to and from a bed to a chair (including a wheelchair)?: A Lot Help needed standing up from a chair using your arms (e.g., wheelchair or bedside chair)?: A Lot Help needed to walk in hospital room?: A Lot Help needed climbing 3-5 steps with a railing? : Total 6 Click Score: 11    End of Session Equipment Utilized During Treatment: Gait belt Activity Tolerance: Treatment limited secondary to agitation Patient left: in bed;with call bell/phone within reach Nurse Communication: Mobility status (bed alarm pad not on bed at the time PT in room.) PT Visit Diagnosis: Unsteadiness on feet (R26.81);Difficulty in walking, not elsewhere classified (R26.2);Other symptoms and signs involving the nervous system (R29.898)    Time: 8756-4332 PT Time Calculation (min) (ACUTE ONLY): 18 min   Charges:   PT Evaluation $PT Eval Low Complexity: [place] Pager [phone] Office [phone]      Claretha Cooper 05/18/2020, 12:12 PM

## 2020-05-18 NOTE — Progress Notes (Signed)
  PROGRESS NOTE  Patient admitted earlier this morning. See H&P. Patient is an 85 yo male with past medical history significant for ongoing alcohol abuse, thiamine deficiency, arthritis, hyperlipidemia, IBS, sleep apnea, spinal stenosis, chronic fatigue who was brought in by his friend with complaints of ongoing ataxia.  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  His thiamine level was noted to be low at 7.  Due to his ongoing ataxia, patient was admitted for MRI brain, PT evaluation.  Patient seen with his friend at bedside.  Patient apparently lives at home alone but his friend stays with him intermittently.  He states that he is feeling fine, his strength is fine, wants to go home.  His friend at bedside however states that patient has been very weak and off balance and is unsafe to be home.  -MRI brain degraded but no evidence of recent infarction, hemorrhage or mass found. -Continue IV thiamine -Appreciate PT evaluation -CIWA protocol    Status is: Observation  The patient remains OBS appropriate and will d/c before 2 midnights.  Possible discharge home 4/27  Dispo: The patient is from: Home              Anticipated d/c is to: Home              Patient currently is not medically stable to d/c.   Difficult to place patient No       Dessa Phi, DO [facility] 05/18/2020, 12:56 PM  Available via Epic secure chat 7am-7pm After these hours, please refer to coverage provider listed on [email or web address]

## 2020-05-18 NOTE — Progress Notes (Signed)
Upon entering room patient was observed with an open bag in his lap full of medications from home.  Significant other in room at this time.  Patient and visitor educated on importance of not taking medications from home while in the hospital.  Visitor states she will take the meds home with her when she leaves.  This RN reinforced to patient that he is not to take any medications that are not given to him by his nurse or doctor.  Patient states understanding.

## 2020-05-18 NOTE — ED Notes (Signed)
Pt repositioned in bed and given food and water. Bed alarm on.

## 2020-05-18 NOTE — ED Notes (Signed)
ED TO INPATIENT HANDOFF REPORT  Name/Age/Gender Jose [name] 85 y.o. male  Code Status    Code Status Orders  (From admission, onward)         Start     Ordered   05/18/20 0210  Full code  Continuous        05/18/20 0210        Code Status History    This patient has a current code status but no historical code status.   Advance Care Planning Activity      Home/SNF/Other Home  Chief Complaint Ataxia [R27.0]  Level of Care/Admitting Diagnosis ED Disposition    ED Disposition Condition Comment   Admit  Hospital Area: [facility] [id number]  Level of Care: Med-Surg [16]  Covid Evaluation: Asymptomatic Screening Protocol (No Symptoms)  Diagnosis: Ataxia [id number]  Admitting Physician: Dwyane Dee [phone]  Attending Physician: Dwyane Dee [phone]       Medical History Past Medical History:  Diagnosis Date  . Acute meniscal tear of knee    RIGHT KNEE  . Arthritis   . Hives    PER PT UNKNOWN WHY  . IBS (irritable bowel syndrome)   . Mild sleep apnea    PER STUDY  11-30-2010--  NO CPAP  . Spinal stenosis     Allergies No Known Allergies  IV Location/Drains/Wounds Patient Lines/Drains/Airways Status    Active Line/Drains/Airways    Name Placement date Placement time Site Days   Peripheral IV 05/17/20 Left Antecubital 05/17/20  1940  Antecubital  1   Incision 04/12/12 Knee Right 04/12/12  1149  -- 2958          Labs/Imaging Results for orders placed or performed during the hospital encounter of 05/17/20 (from the past 48 hour(s))  CBC     Status: None   Collection Time: 05/17/20  8:00 PM  Result Value Ref Range   WBC 4.3 4.0 - 10.5 K/uL   RBC 4.66 4.22 - 5.81 MIL/uL   Hemoglobin 14.3 13.0 - 17.0 g/dL   HCT 42.8 39.0 - 52.0 %   MCV 91.8 80.0 - 100.0 fL   MCH 30.7 26.0 - 34.0 pg   MCHC 33.4 30.0 - 36.0 g/dL   RDW 12.9 11.5 - 15.5 %   Platelets 159 150 - 400 K/uL   nRBC 0.0 0.0 - 0.2 %    Comment: Performed at  [facility], [place] [address] [place], [place] [address]  Comprehensive metabolic panel     Status: Abnormal   Collection Time: 05/17/20  8:00 PM  Result Value Ref Range   Sodium 134 (L) 135 - 145 mmol/L   Potassium 4.0 3.5 - 5.1 mmol/L   Chloride 101 98 - 111 mmol/L   CO2 23 22 - 32 mmol/L   Glucose, Bld 93 70 - 99 mg/dL    Comment: Glucose reference range applies only to samples taken after fasting for at least 8 hours.   BUN 16 8 - 23 mg/dL   Creatinine, Ser 0.76 0.61 - 1.24 mg/dL   Calcium 9.1 8.9 - 10.3 mg/dL   Total Protein 6.8 6.5 - 8.1 g/dL   Albumin 3.7 3.5 - 5.0 g/dL   AST 30 15 - 41 U/L   ALT 18 0 - 44 U/L   Alkaline Phosphatase 54 38 - 126 U/L   Total Bilirubin 0.9 0.3 - 1.2 mg/dL   GFR, Estimated >60 >60 mL/min    Comment: (NOTE) Calculated using the CKD-EPI Creatinine  Equation (2021)    Anion gap 10 5 - 15    Comment: Performed at [facility], [place] [address], [place], [place] [address]  Urinalysis, Routine w reflex microscopic Urine, Clean Catch     Status: Abnormal   Collection Time: 05/17/20  8:27 PM  Result Value Ref Range   Color, Urine YELLOW YELLOW   APPearance CLEAR CLEAR   Specific Gravity, Urine 1.018 1.005 - 1.030   pH 8.0 5.0 - 8.0   Glucose, UA NEGATIVE NEGATIVE mg/dL   Hgb urine dipstick NEGATIVE NEGATIVE   Bilirubin Urine NEGATIVE NEGATIVE   Ketones, ur 80 (A) NEGATIVE mg/dL   Protein, ur NEGATIVE NEGATIVE mg/dL   Nitrite NEGATIVE NEGATIVE   Leukocytes,Ua NEGATIVE NEGATIVE    Comment: Performed at [place] [address] [place], [place] [address]  Lactic acid, plasma     Status: None   Collection Time: 05/17/20  8:29 PM  Result Value Ref Range   Lactic Acid, Venous 1.1 0.5 - 1.9 mmol/L    Comment: Performed at [facility], [place] [address], [place], [place] [address]  Resp Panel by RT-PCR (Flu A&B, Covid) Nasopharyngeal Swab     Status: None   Collection Time:  05/17/20  9:06 PM   Specimen: Nasopharyngeal Swab; Nasopharyngeal(NP) swabs in vial transport medium  Result Value Ref Range   SARS Coronavirus 2 by RT PCR NEGATIVE NEGATIVE    Comment: (NOTE) SARS-CoV-2 target nucleic acids are NOT DETECTED.  The SARS-CoV-2 RNA is generally detectable in upper respiratory specimens during the acute phase of infection. The lowest concentration of SARS-CoV-2 viral copies this assay can detect is 138 copies/mL. A negative result does not preclude SARS-Cov-2 infection and should not be used as the sole basis for treatment or other patient management decisions. A negative result may occur with  improper specimen collection/handling, submission of specimen other than nasopharyngeal swab, presence of viral mutation(s) within the areas targeted by this assay, and inadequate number of viral copies(<138 copies/mL). A negative result must be combined with clinical observations, patient history, and epidemiological information. The expected result is Negative.  Fact Sheet for Patients:  EntrepreneurPulse.com.au  Fact Sheet for Healthcare Providers:  IncredibleEmployment.be  This test is no t yet approved or cleared by the [place] FDA and  has been authorized for detection and/or diagnosis of SARS-CoV-2 by FDA under an Emergency Use Authorization (EUA). This EUA will remain  in effect (meaning this test can be used) for the duration of the COVID-19 declaration under Section 564(b)(1) of the Act, 21 U.S.C.section 360bbb-3(b)(1), unless the authorization is terminated  or revoked sooner.       Influenza A by PCR NEGATIVE NEGATIVE   Influenza B by PCR NEGATIVE NEGATIVE    Comment: (NOTE) The Xpert Xpress SARS-CoV-2/FLU/RSV plus assay is intended as an aid in the diagnosis of influenza from Nasopharyngeal swab specimens and should not be used as a sole basis for treatment. Nasal washings and aspirates are  unacceptable for Xpert Xpress SARS-CoV-2/FLU/RSV testing.  Fact Sheet for Patients: EntrepreneurPulse.com.au  Fact Sheet for Healthcare Providers: IncredibleEmployment.be  This test is not yet approved or cleared by the [place] FDA and has been authorized for detection and/or diagnosis of SARS-CoV-2 by FDA under an Emergency Use Authorization (EUA). This EUA will remain in effect (meaning this test can be used) for the duration of the COVID-19 declaration under Section 564(b)(1) of the Act, 21 U.S.C. section 360bbb-3(b)(1), unless the authorization is  terminated or revoked.  Performed at [facility], [place] [address], [place], [place] [address]   Ethanol     Status: None   Collection Time: 05/17/20 11:07 PM  Result Value Ref Range   Alcohol, Ethyl (B) <10 <10 mg/dL    Comment: (NOTE) Lowest detectable limit for serum alcohol is 10 mg/dL.  For medical purposes only. Performed at [facility], [place] [address], [place], [place] [address]    CT Head Wo Contrast  Result Date: 05/17/2020 CLINICAL DATA:  Chronic shoulder and knee pain with weakness. EXAM: CT HEAD WITHOUT CONTRAST TECHNIQUE: Contiguous axial images were obtained from the base of the skull through the vertex without intravenous contrast. COMPARISON:  MRI brain 03/19/2020.  CT head 08/29/2018 FINDINGS: Brain: No evidence of acute infarction, hemorrhage, hydrocephalus, extra-axial collection or mass lesion/mass effect. Diffuse cerebral atrophy. Ventricular dilatation consistent with central atrophy. Low-attenuation changes in the deep white matter consistent with small vessel ischemia. Vascular: Moderate intracranial arterial vascular calcifications. Skull: The calvarium appears intact. Sinuses/Orbits: Paranasal sinuses and mastoid air cells are clear. Other: None. IMPRESSION: 1. No acute intracranial abnormalities. 2. Chronic atrophy and small vessel  ischemic changes. Electronically Signed   By: Lucienne Capers M.D.   On: 05/17/2020 23:41   DG Chest Portable 1 View  Result Date: 05/17/2020 CLINICAL DATA:  Increased generalized weakness over past 4 days. Today pt was unable to ambulate at home. Former smoker. weakness EXAM: PORTABLE CHEST 1 VIEW COMPARISON:  None. FINDINGS: Normal mediastinum and cardiac silhouette. Normal pulmonary vasculature. No evidence of effusion, infiltrate, or pneumothorax. No acute bony abnormality. IMPRESSION: No acute cardiopulmonary process. Electronically Signed   By: Suzy Bouchard M.D.   On: 05/17/2020 20:57    Pending Labs Unresulted Labs (From admission, onward)          Start     Ordered   05/17/20 2029  Lactic acid, plasma  Now then every 2 hours,   STAT      05/17/20 2028   05/17/20 2029  Blood culture (routine x 2)  BLOOD CULTURE X 2,   STAT      05/17/20 2028          Vitals/Pain Today's Vitals   05/17/20 2346 05/17/20 2349 05/18/20 0045 05/18/20 0211  BP:  127/74 131/82 126/87  Pulse:  (!) 59 (!) 53 91  Resp:  (!) 23 (!) 21 17  Temp: 97.8 F (36.6 C)     TempSrc: Oral     SpO2:  96% 96% 95%  PainSc:        Isolation Precautions No active isolations  Medications Medications  sodium chloride 0.9 % bolus 1,000 mL (0 mLs Intravenous Stopped 05/17/20 2120)    Followed by  0.9 %  sodium chloride infusion (1,000 mLs Intravenous New Bag/Given 05/17/20 2057)  enoxaparin (LOVENOX) injection 40 mg (has no administration in time range)  sodium chloride flush (NS) 0.9 % injection 3 mL (3 mLs Intravenous Given 05/18/20 0217)  acetaminophen (TYLENOL) tablet 650 mg (has no administration in time range)    Or  acetaminophen (TYLENOL) suppository 650 mg (has no administration in time range)  folic acid (FOLVITE) tablet 1 mg (has no administration in time range)  multivitamin with minerals tablet 1 tablet (has no administration in time range)  thiamine 500mg  in normal saline (24ml) IVPB (has no  administration in time range)    Followed by  thiamine tablet 250 mg (has no administration in time range)  acetaminophen (TYLENOL) tablet 650 mg (650 mg Oral Given 05/17/20 2232)    Mobility walks with person assist (baseline ambulatory without assistance)

## 2020-05-18 NOTE — ED Notes (Signed)
Pt given urinal.

## 2020-05-18 NOTE — Progress Notes (Signed)
Patient arrives to room 1514 at this time via stretcher from ED.  Patient requiring full assist to bed.  Patient educated to callbell use and bed controls with stated understanding

## 2020-05-19 DIAGNOSIS — E519 Thiamine deficiency, unspecified: Secondary | ICD-10-CM

## 2020-05-19 DIAGNOSIS — Z20822 Contact with and (suspected) exposure to covid-19: Secondary | ICD-10-CM | POA: Diagnosis present

## 2020-05-19 DIAGNOSIS — M5030 Other cervical disc degeneration, unspecified cervical region: Secondary | ICD-10-CM | POA: Diagnosis present

## 2020-05-19 DIAGNOSIS — Z8249 Family history of ischemic heart disease and other diseases of the circulatory system: Secondary | ICD-10-CM | POA: Diagnosis not present

## 2020-05-19 DIAGNOSIS — E785 Hyperlipidemia, unspecified: Secondary | ICD-10-CM | POA: Diagnosis present

## 2020-05-19 DIAGNOSIS — R5383 Other fatigue: Secondary | ICD-10-CM | POA: Diagnosis present

## 2020-05-19 DIAGNOSIS — F1010 Alcohol abuse, uncomplicated: Secondary | ICD-10-CM

## 2020-05-19 DIAGNOSIS — M1990 Unspecified osteoarthritis, unspecified site: Secondary | ICD-10-CM | POA: Diagnosis present

## 2020-05-19 DIAGNOSIS — R350 Frequency of micturition: Secondary | ICD-10-CM | POA: Diagnosis present

## 2020-05-19 DIAGNOSIS — E876 Hypokalemia: Secondary | ICD-10-CM | POA: Diagnosis present

## 2020-05-19 DIAGNOSIS — R5382 Chronic fatigue, unspecified: Secondary | ICD-10-CM

## 2020-05-19 DIAGNOSIS — F10230 Alcohol dependence with withdrawal, uncomplicated: Secondary | ICD-10-CM

## 2020-05-19 DIAGNOSIS — R509 Fever, unspecified: Secondary | ICD-10-CM | POA: Diagnosis present

## 2020-05-19 DIAGNOSIS — K219 Gastro-esophageal reflux disease without esophagitis: Secondary | ICD-10-CM | POA: Diagnosis present

## 2020-05-19 DIAGNOSIS — K2970 Gastritis, unspecified, without bleeding: Secondary | ICD-10-CM | POA: Diagnosis present

## 2020-05-19 DIAGNOSIS — E512 Wernicke's encephalopathy: Secondary | ICD-10-CM | POA: Diagnosis present

## 2020-05-19 DIAGNOSIS — K589 Irritable bowel syndrome without diarrhea: Secondary | ICD-10-CM | POA: Diagnosis present

## 2020-05-19 DIAGNOSIS — G629 Polyneuropathy, unspecified: Secondary | ICD-10-CM | POA: Diagnosis present

## 2020-05-19 DIAGNOSIS — G4733 Obstructive sleep apnea (adult) (pediatric): Secondary | ICD-10-CM | POA: Diagnosis present

## 2020-05-19 DIAGNOSIS — R531 Weakness: Secondary | ICD-10-CM | POA: Diagnosis not present

## 2020-05-19 DIAGNOSIS — R059 Cough, unspecified: Secondary | ICD-10-CM | POA: Diagnosis present

## 2020-05-19 DIAGNOSIS — G9341 Metabolic encephalopathy: Secondary | ICD-10-CM | POA: Diagnosis present

## 2020-05-19 DIAGNOSIS — R270 Ataxia, unspecified: Secondary | ICD-10-CM | POA: Diagnosis present

## 2020-05-19 DIAGNOSIS — G8929 Other chronic pain: Secondary | ICD-10-CM | POA: Diagnosis present

## 2020-05-19 DIAGNOSIS — M4312 Spondylolisthesis, cervical region: Secondary | ICD-10-CM | POA: Diagnosis present

## 2020-05-19 DIAGNOSIS — R799 Abnormal finding of blood chemistry, unspecified: Secondary | ICD-10-CM | POA: Diagnosis not present

## 2020-05-19 DIAGNOSIS — D72819 Decreased white blood cell count, unspecified: Secondary | ICD-10-CM | POA: Diagnosis present

## 2020-05-19 DIAGNOSIS — E871 Hypo-osmolality and hyponatremia: Secondary | ICD-10-CM | POA: Diagnosis present

## 2020-05-19 LAB — CBC
HCT: 41.6 % (ref 39.0–52.0)
Hemoglobin: 14.1 g/dL (ref 13.0–17.0)
MCH: 30.6 pg (ref 26.0–34.0)
MCHC: 33.9 g/dL (ref 30.0–36.0)
MCV: 90.2 fL (ref 80.0–100.0)
Platelets: 171 10*3/uL (ref 150–400)
RBC: 4.61 MIL/uL (ref 4.22–5.81)
RDW: 12.6 % (ref 11.5–15.5)
WBC: 6.8 10*3/uL (ref 4.0–10.5)
nRBC: 0 % (ref 0.0–0.2)

## 2020-05-19 LAB — BASIC METABOLIC PANEL
Anion gap: 11 (ref 5–15)
BUN: 10 mg/dL (ref 8–23)
CO2: 22 mmol/L (ref 22–32)
Calcium: 9.3 mg/dL (ref 8.9–10.3)
Chloride: 100 mmol/L (ref 98–111)
Creatinine, Ser: 0.76 mg/dL (ref 0.61–1.24)
GFR, Estimated: >60 mL/min (ref >60)
Glucose, Bld: 132 mg/dL — ABNORMAL HIGH (ref 70–99)
Potassium: 3.4 mmol/L — ABNORMAL LOW (ref 3.5–5.1)
Sodium: 133 mmol/L — ABNORMAL LOW (ref 135–145)

## 2020-05-19 LAB — BLOOD CULTURE ID PANEL (REFLEXED) - BCID2

## 2020-05-19 LAB — MAGNESIUM: Magnesium: 1.8 mg/dL (ref 1.7–2.4)

## 2020-05-19 MED ORDER — POTASSIUM CHLORIDE CRYS ER 20 MEQ PO TBCR
40.0000 meq | EXTENDED_RELEASE_TABLET | ORAL | Status: AC
  Administered 2020-05-19 (×2): 40 meq via ORAL
  Filled 2020-05-19 (×2): qty 2

## 2020-05-19 NOTE — Progress Notes (Signed)
Pt restless/confused throughout shift good relief from ativan 2mg x2 Bilat mittens secondary to removal of peripheral iv

## 2020-05-19 NOTE — Progress Notes (Signed)
Pt home medications and swiss army knife given to pt friend Nelda Bucks to take home.

## 2020-05-19 NOTE — Progress Notes (Signed)
PHARMACY - PHYSICIAN COMMUNICATION CRITICAL VALUE ALERT - BLOOD CULTURE IDENTIFICATION (BCID)  Jose [name] is an 85 y.o. male who presented to [facility] on 05/17/2020 with ataxia, alcohol abuse, thiamine deficiency.  Assessment:  1 out of 3 bottles with Staph species (no resistance detected); likely contaminant    Name of physician (or Provider) Contacted: Dr Hal Hope  Current antibiotics: none  Changes to prescribed antibiotics recommended:  No change in therapy recommended  Results for orders placed or performed during the hospital encounter of 05/17/20  Blood Culture ID Panel (Reflexed) (Collected: 05/17/2020  8:34 PM)  Result Value Ref Range   Enterococcus faecalis NOT DETECTED NOT DETECTED   Enterococcus Faecium NOT DETECTED NOT DETECTED   Listeria monocytogenes NOT DETECTED NOT DETECTED   Staphylococcus species DETECTED (A) NOT DETECTED   Staphylococcus aureus (BCID) NOT DETECTED NOT DETECTED   Staphylococcus epidermidis NOT DETECTED NOT DETECTED   Staphylococcus lugdunensis NOT DETECTED NOT DETECTED   Streptococcus species NOT DETECTED NOT DETECTED   Streptococcus agalactiae NOT DETECTED NOT DETECTED   Streptococcus pneumoniae NOT DETECTED NOT DETECTED   Streptococcus pyogenes NOT DETECTED NOT DETECTED   A.calcoaceticus-baumannii NOT DETECTED NOT DETECTED   Bacteroides fragilis NOT DETECTED NOT DETECTED   Enterobacterales NOT DETECTED NOT DETECTED   Enterobacter cloacae complex NOT DETECTED NOT DETECTED   Escherichia coli NOT DETECTED NOT DETECTED   Klebsiella aerogenes NOT DETECTED NOT DETECTED   Klebsiella oxytoca NOT DETECTED NOT DETECTED   Klebsiella pneumoniae NOT DETECTED NOT DETECTED   Proteus species NOT DETECTED NOT DETECTED   Salmonella species NOT DETECTED NOT DETECTED   Serratia marcescens NOT DETECTED NOT DETECTED   Haemophilus influenzae NOT DETECTED NOT DETECTED   Neisseria meningitidis NOT DETECTED NOT DETECTED   Pseudomonas aeruginosa NOT  DETECTED NOT DETECTED   Stenotrophomonas maltophilia NOT DETECTED NOT DETECTED   Candida albicans NOT DETECTED NOT DETECTED   Candida auris NOT DETECTED NOT DETECTED   Candida glabrata NOT DETECTED NOT DETECTED   Candida krusei NOT DETECTED NOT DETECTED   Candida parapsilosis NOT DETECTED NOT DETECTED   Candida tropicalis NOT DETECTED NOT DETECTED   Cryptococcus neoformans/gattii NOT DETECTED NOT DETECTED    Everette Rank, PharmD 05/19/2020  12:53 AM

## 2020-05-19 NOTE — Progress Notes (Signed)
PROGRESS NOTE  Jose [name] [id number] DOB: 12-Jun-1935   PCP: Jilda Panda, MD  Patient is from: Home  DOA: 05/17/2020 LOS: 0  Chief complaints: Ataxia  Brief Narrative / Interim history: 85 year old male history of alcohol abuse, thiamine deficiency, arthritis, OSA, spinal stenosis, chronic fatigue syndrome, IBS and HLD presented to ED with complaints of ataxia, and admitted for further evaluation and treatment.  He was seen by neurology for this problem. His vitamin B1 level was low at 7 nmol/L on 02/27/2020. NCV and EMG on 04/14/2020 concerning for chronic sensorimotor axonal polyneuropathy affecting the lower extremities.    Patient was started on high-dose IV thiamine and CIWA protocol with as needed Ativan and admitted for ataxia and possible alcohol withdrawal  Subjective: Seen and examined earlier this morning.  He was confused and agitated last night requiring mittens and IV Ativan. He was a sleepy this morning but awakes to voice.  He was only oriented to self.  He told RN that he is in [place] although he later told me that he is at [facility].  He denies pain.  Objective: Vitals:   05/18/20 2128 05/19/20 0121 05/19/20 0331 05/19/20 1337  BP: (!) 158/98 (!) 165/88 136/77 105/61  Pulse: (!) 46 60 82 95  Resp: 18 15  16   Temp: 97.6 F (36.4 C) 100.2 F (37.9 C)  (!) 97.2 F (36.2 C)  TempSrc: Oral Oral  Oral  SpO2: 92% 99%  99%    Intake/Output Summary (Last 24 hours) at 05/19/2020 1503 Last data filed at 05/19/2020 [id number] Gross per 24 hour  Intake 100 ml  Output 1050 ml  Net -950 ml   There were no vitals filed for this visit.  Examination:  GENERAL: No apparent distress.  Nontoxic. HEENT: MMM.  Vision and hearing grossly intact.  NECK: Supple.  No apparent JVD.  RESP:  No IWOB.  Fair aeration bilaterally. CVS:  RRR. Heart sounds normal.  ABD/GI/GU: BS+. Abd soft, NTND.  MSK/EXT:  Moves extremities. No apparent deformity. No edema.  SKIN: no  apparent skin lesion or wound NEURO: Awake, alert and oriented to self.  No apparent focal neuro deficit. PSYCH: Calm. Normal affect.  Procedures:  None  Microbiology summarized: -[id number] and influenza PCR nonreactive.  Assessment & Plan: Ataxia likely due to vitamin B1 deficiency and chronic sensorimotor axonal polyneuropathy -MRI brain without acute finding but chronic microvascular ischemic changes and parenchymal volume loss -EMG on 04/14/2020 at neurology office showed chronic sensorimotor axonal polyneuropathy affecting LE's -Thiamine level placed on 7 nmol/L on [id number] -Folic acid and [id number] within normal -Thiamine level pending -Continue high-dose thiamine 500 mg daily -Therapy recommended HH/rolling walker or SNF  Acute metabolic encephalopathy-Per overnight RN, he was restless and confused throughout the night requiring bilateral mittens and Ativan boluses.  He was a sleepy this morning but wakes to voice and oriented to self.  Mental status seems to be fluctuating.  -IV Ativan as above -Reorientation and delirium precautions.  Alcohol abuse/possible withdrawal: CIWA scores elevated to 11. -Continue CIWA monitoring with as needed Ativan -Continue high-dose IV thiamine -Continue p.o. 123456 and folic acid   Thiamine deficiency -Management as above  Chronic fatigue syndrome -Resume home Ritalin  Hyponatremia: Na 133.  Could be due to alcohol. -Recheck in the morning  Hypokalemia: K3.4.  Mg 1.8. -P.o. K-Dur 40 mill equivalent x2  Leukopenia: Resolved.    There is no height or weight on file to calculate BMI.  DVT prophylaxis:  enoxaparin (LOVENOX) injection 40 mg Start: 05/18/20 1000  Code Status: Full code Family Communication: Attempted to call patient's significant other but no answer. Level of care: Med-Surg Status is: Observation  The patient will require care spanning > 2 midnights and should be moved to inpatient because: Altered mental  status, Unsafe d/c plan, IV treatments appropriate due to intensity of illness or inability to take PO and Inpatient level of care appropriate due to severity of illness  Dispo: The patient is from: Home              Anticipated d/c is to: Home              Patient currently is not medically stable to d/c.   Difficult to place patient No       Consultants:  None   Sch Meds:  Scheduled Meds: . enoxaparin (LOVENOX) injection  40 mg Subcutaneous Q24H  . folic acid  1 mg Oral Daily  . multivitamin with minerals  1 tablet Oral Daily  . potassium chloride  40 mEq Oral Q4H  . sodium chloride flush  3 mL Intravenous Q12H  . [START ON 05/20/2020] thiamine  250 mg Oral Daily   Continuous Infusions: . thiamine injection 500 mg (05/19/20 1117)   PRN Meds:.acetaminophen **OR** acetaminophen, labetalol, LORazepam **OR** LORazepam  Antimicrobials: Anti-infectives (From admission, onward)   None       I have personally reviewed the following labs and images: CBC: Recent Labs  Lab 05/17/20 2000 05/18/20 0441 05/18/20 0534 05/19/20 0500  WBC 4.3 2.7* 2.8* 6.8  NEUTROABS  --  1.5* 1.3*  --   HGB 14.3 13.5 13.1 14.1  HCT 42.8 39.9 39.1 41.6  MCV 91.8 92.4 91.4 90.2  PLT 159 157 153 171   BMP &GFR Recent Labs  Lab 05/17/20 2000 05/18/20 0441 05/18/20 0534 05/19/20 0500  NA 134* 136 136 133*  K 4.0 3.3* 3.5 3.4*  CL 101 107 107 100  CO2 23 24 24 22   GLUCOSE 93 160* 132* 132*  BUN 16 14 15 10   CREATININE 0.76 0.78 0.71 0.76  CALCIUM 9.1 8.4* 8.7* 9.3  MG  --  2.1  --  1.8   CrCl cannot be calculated (Unknown ideal weight.). Liver & Pancreas: Recent Labs  Lab 05/17/20 2000  AST 30  ALT 18  ALKPHOS 54  BILITOT 0.9  PROT 6.8  ALBUMIN 3.7   No results for input(s): LIPASE, AMYLASE in the last 168 hours. No results for input(s): AMMONIA in the last 168 hours. Diabetic: No results for input(s): HGBA1C in the last 72 hours. No results for input(s): GLUCAP in the  last 168 hours. Cardiac Enzymes: No results for input(s): CKTOTAL, CKMB, CKMBINDEX, TROPONINI in the last 168 hours. No results for input(s): PROBNP in the last 8760 hours. Coagulation Profile: No results for input(s): INR, PROTIME in the last 168 hours. Thyroid Function Tests: No results for input(s): TSH, T4TOTAL, FREET4, T3FREE, THYROIDAB in the last 72 hours. Lipid Profile: No results for input(s): CHOL, HDL, LDLCALC, TRIG, CHOLHDL, LDLDIRECT in the last 72 hours. Anemia Panel: Recent Labs    05/18/20 0232  VITAMINB12 1,127*  FOLATE 14.2   Urine analysis:    Component Value Date/Time   COLORURINE YELLOW 05/17/2020 2027   APPEARANCEUR CLEAR 05/17/2020 2027   LABSPEC 1.018 05/17/2020 2027   PHURINE 8.0 05/17/2020 2027   GLUCOSEU NEGATIVE 05/17/2020 2027   HGBUR NEGATIVE 05/17/2020 2027   BILIRUBINUR NEGATIVE 05/17/2020  2027   KETONESUR 80 (A) 05/17/2020 2027   PROTEINUR NEGATIVE 05/17/2020 2027   NITRITE NEGATIVE 05/17/2020 2027   LEUKOCYTESUR NEGATIVE 05/17/2020 2027   Sepsis Labs: Invalid input(s): PROCALCITONIN, [place]  Microbiology: Recent Results (from the past 240 hour(s))  Blood culture (routine x 2)     Status: None (Preliminary result)   Collection Time: 05/17/20  8:29 PM   Specimen: BLOOD RIGHT ARM  Result Value Ref Range Status   Specimen Description   Final    BLOOD RIGHT ARM Performed at [facility], [place] [address] [place], [place] [address]    Special Requests   Final    BOTTLES DRAWN AEROBIC ONLY Blood Culture results may not be optimal due to an inadequate volume of blood received in culture bottles Performed at [place] [address], [place], [place] [address]    Culture   Final    NO GROWTH 2 DAYS Performed at [facility] [address] [place], [place] [address]    Report Status PENDING  Incomplete  Blood culture (routine x 2)     Status: Abnormal (Preliminary result)   Collection  Time: 05/17/20  8:34 PM   Specimen: BLOOD LEFT ARM  Result Value Ref Range Status   Specimen Description   Final    BLOOD LEFT ARM Performed at [place] [address] [place], [place] [address]    Special Requests   Final    BOTTLES DRAWN AEROBIC AND ANAEROBIC Blood Culture adequate volume Performed at [facility] [address] [place], [place] [address]    Culture  Setup Time   Final    ANAEROBIC BOTTLE ONLY GRAM POSITIVE COCCI CRITICAL RESULT CALLED TO, READ BACK BY AND VERIFIED WITH: L POINDEXTER [facility] 05/19/20 0035 JDW Performed at [facility], [place] [address], [place], [place] [address]    Culture STAPHYLOCOCCUS HOMINIS (A)  Final   Report Status PENDING  Incomplete  Blood Culture ID Panel (Reflexed)     Status: Abnormal   Collection Time: 05/17/20  8:34 PM  Result Value Ref Range Status   Enterococcus faecalis NOT DETECTED NOT DETECTED Final   Enterococcus Faecium NOT DETECTED NOT DETECTED Final   Listeria monocytogenes NOT DETECTED NOT DETECTED Final   Staphylococcus species DETECTED (A) NOT DETECTED Final    Comment: CRITICAL RESULT CALLED TO, READ BACK BY AND VERIFIED WITH: L POINDEXTER [facility] 05/19/20 0035 JDW    Staphylococcus aureus (BCID) NOT DETECTED NOT DETECTED Final   Staphylococcus epidermidis NOT DETECTED NOT DETECTED Final   Staphylococcus lugdunensis NOT DETECTED NOT DETECTED Final   Streptococcus species NOT DETECTED NOT DETECTED Final   Streptococcus agalactiae NOT DETECTED NOT DETECTED Final   Streptococcus pneumoniae NOT DETECTED NOT DETECTED Final   Streptococcus pyogenes NOT DETECTED NOT DETECTED Final   A.calcoaceticus-baumannii NOT DETECTED NOT DETECTED Final   Bacteroides fragilis NOT DETECTED NOT DETECTED Final   Enterobacterales NOT DETECTED NOT DETECTED Final   Enterobacter cloacae complex NOT DETECTED NOT DETECTED Final   Escherichia coli NOT DETECTED NOT DETECTED Final   Klebsiella  aerogenes NOT DETECTED NOT DETECTED Final   Klebsiella oxytoca NOT DETECTED NOT DETECTED Final   Klebsiella pneumoniae NOT DETECTED NOT DETECTED Final   Proteus species NOT DETECTED NOT DETECTED Final   Salmonella species NOT DETECTED NOT DETECTED Final   Serratia marcescens NOT DETECTED NOT DETECTED Final   Haemophilus influenzae NOT DETECTED NOT DETECTED Final   Neisseria  meningitidis NOT DETECTED NOT DETECTED Final   Pseudomonas aeruginosa NOT DETECTED NOT DETECTED Final   Stenotrophomonas maltophilia NOT DETECTED NOT DETECTED Final   Candida albicans NOT DETECTED NOT DETECTED Final   Candida auris NOT DETECTED NOT DETECTED Final   Candida glabrata NOT DETECTED NOT DETECTED Final   Candida krusei NOT DETECTED NOT DETECTED Final   Candida parapsilosis NOT DETECTED NOT DETECTED Final   Candida tropicalis NOT DETECTED NOT DETECTED Final   Cryptococcus neoformans/gattii NOT DETECTED NOT DETECTED Final    Comment: Performed at [facility], [place] [address] [place], [place] [address]  Resp Panel by RT-PCR (Flu A&B, Covid) Nasopharyngeal Swab     Status: None   Collection Time: 05/17/20  9:06 PM   Specimen: Nasopharyngeal Swab; Nasopharyngeal(NP) swabs in vial transport medium  Result Value Ref Range Status   SARS Coronavirus 2 by RT PCR NEGATIVE NEGATIVE Final    Comment: (NOTE) SARS-CoV-2 target nucleic acids are NOT DETECTED.  The SARS-CoV-2 RNA is generally detectable in upper respiratory specimens during the acute phase of infection. The lowest concentration of SARS-CoV-2 viral copies this assay can detect is 138 copies/mL. A negative result does not preclude SARS-Cov-2 infection and should not be used as the sole basis for treatment or other patient management decisions. A negative result may occur with  improper specimen collection/handling, submission of specimen other than nasopharyngeal swab, presence of viral mutation(s) within the areas targeted by this assay, and  inadequate number of viral copies(<138 copies/mL). A negative result must be combined with clinical observations, patient history, and epidemiological information. The expected result is Negative.  Fact Sheet for Patients:  EntrepreneurPulse.com.au  Fact Sheet for Healthcare Providers:  IncredibleEmployment.be  This test is no t yet approved or cleared by the [place] FDA and  has been authorized for detection and/or diagnosis of SARS-CoV-2 by FDA under an Emergency Use Authorization (EUA). This EUA will remain  in effect (meaning this test can be used) for the duration of the COVID-19 declaration under Section 564(b)(1) of the Act, 21 U.S.C.section 360bbb-3(b)(1), unless the authorization is terminated  or revoked sooner.       Influenza A by PCR NEGATIVE NEGATIVE Final   Influenza B by PCR NEGATIVE NEGATIVE Final    Comment: (NOTE) The Xpert Xpress SARS-CoV-2/FLU/RSV plus assay is intended as an aid in the diagnosis of influenza from Nasopharyngeal swab specimens and should not be used as a sole basis for treatment. Nasal washings and aspirates are unacceptable for Xpert Xpress SARS-CoV-2/FLU/RSV testing.  Fact Sheet for Patients: EntrepreneurPulse.com.au  Fact Sheet for Healthcare Providers: IncredibleEmployment.be  This test is not yet approved or cleared by the [place] FDA and has been authorized for detection and/or diagnosis of SARS-CoV-2 by FDA under an Emergency Use Authorization (EUA). This EUA will remain in effect (meaning this test can be used) for the duration of the COVID-19 declaration under Section 564(b)(1) of the Act, 21 U.S.C. section 360bbb-3(b)(1), unless the authorization is terminated or revoked.  Performed at [facility], [place] [address], [place], [place] [address]     Radiology Studies: No results found.    Keelia Graybill T. [place]  If 7PM-7AM, please contact night-coverage [email or web address] 05/19/2020, 3:03 PM

## 2020-05-20 DIAGNOSIS — E519 Thiamine deficiency, unspecified: Secondary | ICD-10-CM | POA: Diagnosis not present

## 2020-05-20 DIAGNOSIS — R531 Weakness: Secondary | ICD-10-CM | POA: Diagnosis not present

## 2020-05-20 DIAGNOSIS — M542 Cervicalgia: Secondary | ICD-10-CM

## 2020-05-20 DIAGNOSIS — R270 Ataxia, unspecified: Secondary | ICD-10-CM | POA: Diagnosis not present

## 2020-05-20 DIAGNOSIS — G9341 Metabolic encephalopathy: Secondary | ICD-10-CM | POA: Diagnosis not present

## 2020-05-20 LAB — RENAL FUNCTION PANEL
Albumin: 3.6 g/dL (ref 3.5–5.0)
Anion gap: 9 (ref 5–15)
BUN: 14 mg/dL (ref 8–23)
CO2: 23 mmol/L (ref 22–32)
Calcium: 9.4 mg/dL (ref 8.9–10.3)
Chloride: 104 mmol/L (ref 98–111)
Creatinine, Ser: 0.92 mg/dL (ref 0.61–1.24)
GFR, Estimated: >60 mL/min (ref >60)
Glucose, Bld: 110 mg/dL — ABNORMAL HIGH (ref 70–99)
Phosphorus: 3.4 mg/dL (ref 2.5–4.6)
Potassium: 4.5 mmol/L (ref 3.5–5.1)
Sodium: 136 mmol/L (ref 135–145)

## 2020-05-20 LAB — CBC
HCT: 42 % (ref 39.0–52.0)
Hemoglobin: 14.3 g/dL (ref 13.0–17.0)
MCH: 30.9 pg (ref 26.0–34.0)
MCHC: 34 g/dL (ref 30.0–36.0)
MCV: 90.7 fL (ref 80.0–100.0)
Platelets: 187 10*3/uL (ref 150–400)
RBC: 4.63 MIL/uL (ref 4.22–5.81)
RDW: 12.8 % (ref 11.5–15.5)
WBC: 6.1 10*3/uL (ref 4.0–10.5)
nRBC: 0 % (ref 0.0–0.2)

## 2020-05-20 LAB — MAGNESIUM: Magnesium: 2 mg/dL (ref 1.7–2.4)

## 2020-05-20 LAB — CK: Total CK: 99 U/L (ref 49–397)

## 2020-05-20 LAB — HEMOGLOBIN A1C
Hgb A1c MFr Bld: 5.6 % (ref 4.8–5.6)
Mean Plasma Glucose: 114.02 mg/dL

## 2020-05-20 MED ORDER — ALPRAZOLAM 0.5 MG PO TABS: 0.5000 mg | ORAL_TABLET | Freq: Every day | ORAL | Status: DC

## 2020-05-20 MED ORDER — THIAMINE HCL 100 MG PO TABS: 250.0000 mg | ORAL_TABLET | Freq: Every day | ORAL | Status: DC

## 2020-05-20 MED ORDER — THIAMINE HCL 100 MG/ML IJ SOLN
100.0000 mg | Freq: Every day | INTRAMUSCULAR | Status: DC
  Administered 2020-05-22 – 2020-05-25 (×4): 100 mg via INTRAVENOUS
  Filled 2020-05-20 (×4): qty 2

## 2020-05-20 MED ORDER — THIAMINE HCL 100 MG/ML IJ SOLN
250.0000 mg | Freq: Three times a day (TID) | INTRAVENOUS | Status: AC
  Administered 2020-05-20 – 2020-05-21 (×5): 250 mg via INTRAVENOUS
  Filled 2020-05-20 (×5): qty 2.5

## 2020-05-20 MED ORDER — CYCLOBENZAPRINE HCL 5 MG PO TABS
5.0000 mg | ORAL_TABLET | Freq: Three times a day (TID) | ORAL | Status: DC | PRN
  Administered 2020-05-20 – 2020-05-21 (×2): 5 mg via ORAL
  Filled 2020-05-20 (×2): qty 1

## 2020-05-20 MED ORDER — ALPRAZOLAM 0.5 MG PO TABS
0.5000 mg | ORAL_TABLET | Freq: Every day | ORAL | Status: DC
  Administered 2020-05-20 – 2020-05-24 (×5): 0.5 mg via ORAL
  Filled 2020-05-20 (×6): qty 1

## 2020-05-20 MED ORDER — ACETAMINOPHEN 500 MG PO TABS
500.0000 mg | ORAL_TABLET | Freq: Three times a day (TID) | ORAL | Status: DC
  Administered 2020-05-20 – 2020-05-24 (×14): 500 mg via ORAL
  Filled 2020-05-20 (×15): qty 1

## 2020-05-20 MED ORDER — MELATONIN 5 MG PO TABS
5.0000 mg | ORAL_TABLET | Freq: Every day | ORAL | Status: DC
  Administered 2020-05-20 – 2020-05-24 (×5): 5 mg via ORAL
  Filled 2020-05-20 (×5): qty 1

## 2020-05-20 NOTE — Evaluation (Signed)
Occupational Therapy Evaluation Patient Details Name: Jose [name] MRN: [id number] DOB: 06-20-1935 Today's Date: 05/20/2020    History of Present Illness [name] is an 85 yo male with PMH ongoing alcohol abuse, thiamine deficiency, arthritis, HLD, IBS, sleep apnea, spinal stenosis, chronic fatigue who presented to the ER with complaints of ongoing ataxia.   Clinical Impression   Information obtained from PT eval/case manager as patient more lethargic and confused today (possibly due to medications.) Patient lives alone in a single level house with 1 step to enter. Is independent and active at baseline, works with a Physiological scientist. Currently patient states he is at a hotel, will not keep eyes open for more than few seconds despite prompts. Attempt bed mobility however patient needing total A for log roll technique, increased back pain with patient moaning, resistive. Patient with strong posterior lean not attempting to sit upright therefore returned to bed. Will continue to follow to further assess mobility and self care tasks as patient able. Currently would recommend ST rehab at D/C however if patient declines will need initial 24/7 support.     Follow Up Recommendations  SNF;Other (comment) (if patient refuses HH with 24/7)    Equipment Recommendations  Other (comment) (TBA pending patient progress)       Precautions / Restrictions Precautions Precautions: Fall Restrictions Weight Bearing Restrictions: No      Mobility Bed Mobility Overal bed mobility: Needs Assistance Bed Mobility: Rolling;Sidelying to Sit;Sit to Sidelying Rolling: Total assist Sidelying to sit: Total assist     Sit to sidelying: Total assist General bed mobility comments: patient initially following directions for log roll technique however will let his leg down before OT instructs. Ultimately total A to complete bed mobility, patient resistive reporting back pain. limited sitting tolerance  trying to lean back onto bed therefore returned to semi-supine    Transfers                 General transfer comment: unable. limited sitting tolerance 2* back pain    Balance Overall balance assessment: Needs assistance Sitting-balance support: Bilateral upper extremity supported;Feet supported Sitting balance-Leahy Scale: Zero Sitting balance - Comments: posterior bias, increased pain, resistive                                   ADL either performed or assessed with clinical judgement   ADL Overall ADL's : Needs assistance/impaired     Grooming: Moderate assistance;Sitting Grooming Details (indicate cue type and reason): poor sitting tolerance 2* back pain Upper Body Bathing: Moderate assistance;Sitting Upper Body Bathing Details (indicate cue type and reason): poor sitting tolerance 2* back pain Lower Body Bathing: Maximal assistance;Sitting/lateral leans Lower Body Bathing Details (indicate cue type and reason): poor sitting tolerance 2* back pain Upper Body Dressing : Moderate assistance;Sitting   Lower Body Dressing: Total assistance;Sitting/lateral leans Lower Body Dressing Details (indicate cue type and reason): poor sitting tolerance 2* back pain   Toilet Transfer Details (indicate cue type and reason): unable, patient resistive to sitting up at edge of bed, not opening eyes or attempting to hold balance Toileting- Clothing Manipulation and Hygiene: Total assistance;Bed level       Functional mobility during ADLs: Total assistance General ADL Comments: patient requiring increased assist with self care due to mentation/arousal, limited activity tolerance, balance and pain                  Pertinent  Vitals/Pain Pain Assessment: Faces Faces Pain Scale: Hurts even more Pain Location: back Pain Descriptors / Indicators: Moaning;Guarding;Grimacing Pain Intervention(s): Limited activity within patient's tolerance     Hand Dominance Right    Extremity/Trunk Assessment Upper Extremity Assessment Upper Extremity Assessment: Overall WFL for tasks assessed   Lower Extremity Assessment Lower Extremity Assessment: Defer to PT evaluation       Communication Communication Communication: No difficulties   Cognition Arousal/Alertness: Lethargic Behavior During Therapy: Flat affect Overall Cognitive Status: No family/caregiver present to determine baseline cognitive functioning                                 General Comments: unsure if ativan from 2am effecting patient mentation, minimally keeping eyes open but does respond to questions. when asked where he is states hotel. limited direction following              Home Living Family/patient expects to be discharged to:: Private residence Living Arrangements: Spouse/significant other Available Help at Discharge: Family;Available PRN/intermittently Type of Home: House Home Access: Stairs to enter Entrance Stairs-Number of Steps: 1   Home Layout: One level (with basement)     Bathroom Shower/Tub: Tub/shower unit         Home Equipment: None          Prior Functioning/Environment Level of Independence: Independent        Comments: per case manager patient's significant other states patient enjoys being active and has been working with a Physiological scientist        OT Problem List: Decreased activity tolerance;Impaired balance (sitting and/or standing);Decreased cognition;Decreased safety awareness;Pain      OT Treatment/Interventions: Self-care/ADL training;DME and/or AE instruction;Therapeutic activities;Cognitive remediation/compensation;Patient/family education;Balance training    OT Goals(Current goals can be found in the care plan section) Acute Rehab OT Goals Patient Stated Goal: "I need relief" OT Goal Formulation: With patient Time For Goal Achievement: 06/03/20 Potential to Achieve Goals: Good  OT Frequency: Min 2X/week    AM-PAC  OT "6 Clicks" Daily Activity     Outcome Measure Help from another person eating meals?: A Lot Help from another person taking care of personal grooming?: A Lot Help from another person toileting, which includes using toliet, bedpan, or urinal?: Total Help from another person bathing (including washing, rinsing, drying)?: A Lot Help from another person to put on and taking off regular upper body clothing?: A Lot Help from another person to put on and taking off regular lower body clothing?: Total 6 Click Score: 10   End of Session Nurse Communication: Mobility status  Activity Tolerance: Patient limited by pain;Patient limited by lethargy Patient left: in bed;with call bell/phone within reach;with bed alarm set;Other (comment) (mits on)  OT Visit Diagnosis: Other abnormalities of gait and mobility (R26.89);Pain;Other symptoms and signs involving cognitive function Pain - part of body:  (back)                Time: 1127-1140 OT Time Calculation (min): 13 min Charges:  OT General Charges $OT Visit: 1 Visit OT Evaluation $OT Eval Low Complexity: [place] OT OT pager: [phone]  Rosemary Holms 05/20/2020, 1:07 PM

## 2020-05-20 NOTE — Progress Notes (Signed)
PROGRESS NOTE  Jose [name] LOV:[id number] DOB: [birth year]   PCP: Jilda Panda, MD  Patient is from: Home.  Lives alone.  DOA: 05/17/2020 LOS: 1  Chief complaints: Ataxia  Brief Narrative / Interim history: 85 year old male history of alcohol abuse, thiamine deficiency, arthritis, OSA, spinal stenosis, chronic fatigue syndrome, IBS and HLD presented to ED with complaints of ataxia, and admitted for further evaluation and treatment.  He was seen by neurology for this problem. His vitamin B1 level was low at 7 nmol/L on 02/27/2020. NCV and EMG on 04/14/2020 concerning for chronic sensorimotor axonal polyneuropathy affecting the lower extremities.    Patient was started on high-dose IV thiamine and CIWA protocol with as needed Ativan and admitted for ataxia and possible alcohol withdrawal  Subjective: Seen and examined earlier this morning, and this afternoon.  No major events overnight of this morning.  Complaining of pain behind his left ear.  He is somewhat groggy and tends to fall asleep quickly.  He is oriented to self, place and person but not time.  He has difficulty following commands during neuro exam.   Objective: Vitals:   05/19/20 1337 05/19/20 2348 05/20/20 0513 05/20/20 1316  BP: 105/61 124/82 137/86 (!) 140/56  Pulse: 95 84 98 65  Resp: 16 18 18 20   Temp: (!) 97.2 F (36.2 C) 98.7 F (37.1 C) 98.2 F (36.8 C) 97.7 F (36.5 C)  TempSrc: Oral Oral Oral Oral  SpO2: 99% 99% 95% 97%    Intake/Output Summary (Last 24 hours) at 05/20/2020 1327 Last data filed at 05/20/2020 1323 Gross per 24 hour  Intake 360 ml  Output 1700 ml  Net -1340 ml   There were no vitals filed for this visit.  Examination:  GENERAL: No apparent distress.  Nontoxic. HEENT: Hearing and vision grossly intact.  No apparent lesion around his left ear. NECK: Limited rotational range of motion in his neck due to pain RESP: On RA.  No IWOB.  Fair aeration bilaterally. CVS:  RRR. Heart sounds  normal.  ABD/GI/GU: BS+. Abd soft, NTND.  MSK/EXT:  Moves extremities. No apparent deformity. No edema.  SKIN: no apparent skin lesion or wound NEURO: Awake but somewhat groggy.  Falls back asleep quickly.  Oriented to self, place and person.  PERRL.  No apparent pronator drift.  Patellar reflex symmetric.  Further neuro exam limited by patient's inability to follow commands PSYCH: Calm.  No distress or agitation.  Procedures:  None  Microbiology summarized: -[id number] and influenza PCR nonreactive.  Assessment & Plan: Acute metabolic encephalopathy-reported history of ataxia and vitamin B1 deficiency raises concern for Warnicke encephalopathy.  However, mental status seems to be waxing and waning suggesting delirium. -MRI brain without acute finding but chronic microvascular ischemic changes and parenchymal volume loss -Thiamine level placed on 7 nmol/L on 02/27/2020.  Y30 and folic acid within normal. -Continue high-dose IV thiamine-Warnicke dose  -Resume home Xanax before bedtime, and discontinue IV Ativan with CIWA. -Reorientation and delirium precautions  Chronic sensorimotor axonal polyneuropathy -EMG on 04/14/2020 at neurology office showed chronic sensorimotor axonal polyneuropathy affecting LE's --Therapy recommended SNF if patient is agreeable  Alcohol abuse/possible withdrawal: CIWA scores improved -Discontinue CIWA and Ativan -Continue thiamine as above  Thiamine deficiency -Management as above  Chronic fatigue syndrome -Resume home Ritalin  Hyponatremia: Could be due to alcohol.  Resolved.  Hypokalemia: Resolved.  Leukopenia: Resolved.  Left-sided neck pain-seems like muscle strain. -Scheduled Tylenol -K pad -Low-dose muscle relaxer   There is no height or  weight on file to calculate BMI.         DVT prophylaxis:  enoxaparin (LOVENOX) injection 40 mg Start: 05/18/20 1000  Code Status: Full code Family Communication: Updated patient's significant  other at bedside. Level of care: Med-Surg Status is: Inpatient  Remains inpatient appropriate because:Altered mental status, Unsafe d/c plan, IV treatments appropriate due to intensity of illness or inability to take PO and Inpatient level of care appropriate due to severity of illness   Dispo: The patient is from: Home              Anticipated d/c is to: SNF              Patient currently is not medically stable to d/c.   Difficult to place patient No           Consultants:  None   Sch Meds:  Scheduled Meds: . enoxaparin (LOVENOX) injection  40 mg Subcutaneous Q24H  . folic acid  1 mg Oral Daily  . multivitamin with minerals  1 tablet Oral Daily  . sodium chloride flush  3 mL Intravenous Q12H  . [START ON 05/22/2020] thiamine injection  100 mg Intravenous Daily   Followed by  . [START ON 05/27/2020] thiamine  250 mg Oral Daily   Continuous Infusions: . thiamine injection     PRN Meds:.acetaminophen **OR** acetaminophen, labetalol, LORazepam **OR** LORazepam  Antimicrobials: Anti-infectives (From admission, onward)   None       I have personally reviewed the following labs and images: CBC: Recent Labs  Lab 05/17/20 2000 05/18/20 0441 05/18/20 0534 05/19/20 0500 05/20/20 0527  WBC 4.3 2.7* 2.8* 6.8 6.1  NEUTROABS  --  1.5* 1.3*  --   --   HGB 14.3 13.5 13.1 14.1 14.3  HCT 42.8 39.9 39.1 41.6 42.0  MCV 91.8 92.4 91.4 90.2 90.7  PLT 159 157 153 171 187   BMP &GFR Recent Labs  Lab 05/17/20 2000 05/18/20 0441 05/18/20 0534 05/19/20 0500 05/20/20 0527  NA 134* 136 136 133* 136  K 4.0 3.3* 3.5 3.4* 4.5  CL 101 107 107 100 104  CO2 23 24 24 22 23   GLUCOSE 93 160* 132* 132* 110*  BUN 16 14 15 10 14   CREATININE 0.76 0.78 0.71 0.76 0.92  CALCIUM 9.1 8.4* 8.7* 9.3 9.4  MG  --  2.1  --  1.8 2.0  PHOS  --   --   --   --  3.4   CrCl cannot be calculated (Unknown ideal weight.). Liver & Pancreas: Recent Labs  Lab 05/17/20 2000 05/20/20 0527  AST 30   --   ALT 18  --   ALKPHOS 54  --   BILITOT 0.9  --   PROT 6.8  --   ALBUMIN 3.7 3.6   No results for input(s): LIPASE, AMYLASE in the last 168 hours. No results for input(s): AMMONIA in the last 168 hours. Diabetic: Recent Labs    05/20/20 0527  HGBA1C 5.6   No results for input(s): GLUCAP in the last 168 hours. Cardiac Enzymes: Recent Labs  Lab 05/20/20 0527  CKTOTAL 99   No results for input(s): PROBNP in the last 8760 hours. Coagulation Profile: No results for input(s): INR, PROTIME in the last 168 hours. Thyroid Function Tests: No results for input(s): TSH, T4TOTAL, FREET4, T3FREE, THYROIDAB in the last 72 hours. Lipid Profile: No results for input(s): CHOL, HDL, LDLCALC, TRIG, CHOLHDL, LDLDIRECT in the last 72 hours. Anemia Panel: Recent Labs  05/18/20 0232  VITAMINB12 1,127*  FOLATE 14.2   Urine analysis:    Component Value Date/Time   COLORURINE YELLOW 05/17/2020 2027   APPEARANCEUR CLEAR 05/17/2020 2027   LABSPEC 1.018 05/17/2020 2027   PHURINE 8.0 05/17/2020 2027   GLUCOSEU NEGATIVE 05/17/2020 2027   HGBUR NEGATIVE 05/17/2020 2027   BILIRUBINUR NEGATIVE 05/17/2020 2027   KETONESUR 80 (A) 05/17/2020 2027   PROTEINUR NEGATIVE 05/17/2020 2027   NITRITE NEGATIVE 05/17/2020 2027   LEUKOCYTESUR NEGATIVE 05/17/2020 2027   Sepsis Labs: Invalid input(s): PROCALCITONIN, [place]  Microbiology: Recent Results (from the past 240 hour(s))  Blood culture (routine x 2)     Status: None (Preliminary result)   Collection Time: 05/17/20  8:29 PM   Specimen: BLOOD RIGHT ARM  Result Value Ref Range Status   Specimen Description   Final    BLOOD RIGHT ARM Performed at [facility], [place] [address], [place], [place] [address]    Special Requests   Final    BOTTLES DRAWN AEROBIC ONLY Blood Culture results may not be optimal due to an inadequate volume of blood received in culture bottles Performed at [facility]  [address] [place], [place] [address]    Culture   Final    NO GROWTH 3 DAYS Performed at [facility], [place] [address] [place], [place] [address]    Report Status PENDING  Incomplete  Blood culture (routine x 2)     Status: Abnormal (Preliminary result)   Collection Time: 05/17/20  8:34 PM   Specimen: BLOOD LEFT ARM  Result Value Ref Range Status   Specimen Description   Final    BLOOD LEFT ARM Performed at [facility] [address], [place], [place] [address]    Special Requests   Final    BOTTLES DRAWN AEROBIC AND ANAEROBIC Blood Culture adequate volume Performed at [facility] [address] [place], [place] [address]    Culture  Setup Time   Final    ANAEROBIC BOTTLE ONLY GRAM POSITIVE COCCI CRITICAL RESULT CALLED TO, READ BACK BY AND VERIFIED WITH: L POINDEXTER PHARMD 05/19/20 0035 JDW GRAM POSITIVE RODS AEROBIC BOTTLE ONLY CRITICAL RESULT CALLED TO, READ BACK BY AND VERIFIED WITH: L CURRAN PHARMD 1809 05/19/20 A BROWNING    Culture (A)  Final    STAPHYLOCOCCUS HOMINIS THE SIGNIFICANCE OF ISOLATING THIS ORGANISM FROM A SINGLE SET OF BLOOD CULTURES WHEN MULTIPLE SETS ARE DRAWN IS UNCERTAIN. PLEASE NOTIFY THE MICROBIOLOGY DEPARTMENT WITHIN ONE WEEK IF SPECIATION AND SENSITIVITIES ARE REQUIRED. CULTURE REINCUBATED FOR BETTER GROWTH Performed at [facility], [place] [address] [place],  [address]    Report Status PENDING  Incomplete  Blood Culture ID Panel (Reflexed)     Status: Abnormal   Collection Time: 05/17/20  8:34 PM  Result Value Ref Range Status   Enterococcus faecalis NOT DETECTED NOT DETECTED Final   Enterococcus Faecium NOT DETECTED NOT DETECTED Final   Listeria monocytogenes NOT DETECTED NOT DETECTED Final   Staphylococcus species DETECTED (A) NOT DETECTED Final    Comment: CRITICAL RESULT CALLED TO, READ BACK BY AND VERIFIED WITH: L POINDEXTER PHARMD 05/19/20 0035 JDW    Staphylococcus aureus (BCID) NOT  DETECTED NOT DETECTED Final   Staphylococcus epidermidis NOT DETECTED NOT DETECTED Final   Staphylococcus lugdunensis NOT DETECTED NOT DETECTED Final   Streptococcus species NOT DETECTED NOT DETECTED Final   Streptococcus agalactiae NOT DETECTED NOT DETECTED Final   Streptococcus  pneumoniae NOT DETECTED NOT DETECTED Final   Streptococcus pyogenes NOT DETECTED NOT DETECTED Final   A.calcoaceticus-baumannii NOT DETECTED NOT DETECTED Final   Bacteroides fragilis NOT DETECTED NOT DETECTED Final   Enterobacterales NOT DETECTED NOT DETECTED Final   Enterobacter cloacae complex NOT DETECTED NOT DETECTED Final   Escherichia coli NOT DETECTED NOT DETECTED Final   Klebsiella aerogenes NOT DETECTED NOT DETECTED Final   Klebsiella oxytoca NOT DETECTED NOT DETECTED Final   Klebsiella pneumoniae NOT DETECTED NOT DETECTED Final   Proteus species NOT DETECTED NOT DETECTED Final   Salmonella species NOT DETECTED NOT DETECTED Final   Serratia marcescens NOT DETECTED NOT DETECTED Final   Haemophilus influenzae NOT DETECTED NOT DETECTED Final   Neisseria meningitidis NOT DETECTED NOT DETECTED Final   Pseudomonas aeruginosa NOT DETECTED NOT DETECTED Final   Stenotrophomonas maltophilia NOT DETECTED NOT DETECTED Final   Candida albicans NOT DETECTED NOT DETECTED Final   Candida auris NOT DETECTED NOT DETECTED Final   Candida glabrata NOT DETECTED NOT DETECTED Final   Candida krusei NOT DETECTED NOT DETECTED Final   Candida parapsilosis NOT DETECTED NOT DETECTED Final   Candida tropicalis NOT DETECTED NOT DETECTED Final   Cryptococcus neoformans/gattii NOT DETECTED NOT DETECTED Final    Comment: Performed at [facility], [address], [place], [place] [address]  Resp Panel by RT-PCR (Flu A&B, Covid) Nasopharyngeal Swab     Status: None   Collection Time: 05/17/20  9:06 PM   Specimen: Nasopharyngeal Swab; Nasopharyngeal(NP) swabs in vial transport medium  Result Value Ref Range Status   SARS  Coronavirus 2 by RT PCR NEGATIVE NEGATIVE Final    Comment: (NOTE) SARS-CoV-2 target nucleic acids are NOT DETECTED.  The SARS-CoV-2 RNA is generally detectable in upper respiratory specimens during the acute phase of infection. The lowest concentration of SARS-CoV-2 viral copies this assay can detect is 138 copies/mL. A negative result does not preclude SARS-Cov-2 infection and should not be used as the sole basis for treatment or other patient management decisions. A negative result may occur with  improper specimen collection/handling, submission of specimen other than nasopharyngeal swab, presence of viral mutation(s) within the areas targeted by this assay, and inadequate number of viral copies(<138 copies/mL). A negative result must be combined with clinical observations, patient history, and epidemiological information. The expected result is Negative.  Fact Sheet for Patients:  EntrepreneurPulse.com.au  Fact Sheet for Healthcare Providers:  IncredibleEmployment.be  This test is no t yet approved or cleared by the [place] FDA and  has been authorized for detection and/or diagnosis of SARS-CoV-2 by FDA under an Emergency Use Authorization (EUA). This EUA will remain  in effect (meaning this test can be used) for the duration of the COVID-19 declaration under Section 564(b)(1) of the Act, 21 U.S.C.section 360bbb-3(b)(1), unless the authorization is terminated  or revoked sooner.       Influenza A by PCR NEGATIVE NEGATIVE Final   Influenza B by PCR NEGATIVE NEGATIVE Final    Comment: (NOTE) The Xpert Xpress SARS-CoV-2/FLU/RSV plus assay is intended as an aid in the diagnosis of influenza from Nasopharyngeal swab specimens and should not be used as a sole basis for treatment. Nasal washings and aspirates are unacceptable for Xpert Xpress SARS-CoV-2/FLU/RSV testing.  Fact Sheet for  Patients: EntrepreneurPulse.com.au  Fact Sheet for Healthcare Providers: IncredibleEmployment.be  This test is not yet approved or cleared by the [place] FDA and has been authorized for detection and/or diagnosis of SARS-CoV-2 by FDA under  an Emergency Use Authorization (EUA). This EUA will remain in effect (meaning this test can be used) for the duration of the COVID-19 declaration under Section 564(b)(1) of the Act, 21 U.S.C. section 360bbb-3(b)(1), unless the authorization is terminated or revoked.  Performed at [facility], [place] [address] [place], [place] [address]     Radiology Studies: No results found.    Acy Orsak T. Century  If 7PM-7AM, please contact night-coverage [email or web address] 05/20/2020, 1:27 PM

## 2020-05-21 ENCOUNTER — Inpatient Hospital Stay (HOSPITAL_COMMUNITY): Payer: Medicare Other

## 2020-05-21 DIAGNOSIS — G9341 Metabolic encephalopathy: Secondary | ICD-10-CM | POA: Diagnosis not present

## 2020-05-21 DIAGNOSIS — R270 Ataxia, unspecified: Secondary | ICD-10-CM | POA: Diagnosis not present

## 2020-05-21 DIAGNOSIS — E519 Thiamine deficiency, unspecified: Secondary | ICD-10-CM | POA: Diagnosis not present

## 2020-05-21 DIAGNOSIS — R531 Weakness: Secondary | ICD-10-CM | POA: Diagnosis not present

## 2020-05-21 MED ORDER — KETOROLAC TROMETHAMINE 15 MG/ML IJ SOLN
15.0000 mg | Freq: Three times a day (TID) | INTRAMUSCULAR | Status: DC | PRN
  Administered 2020-05-21 – 2020-05-22 (×4): 15 mg via INTRAVENOUS
  Filled 2020-05-21 (×5): qty 1

## 2020-05-21 MED ORDER — NAPROXEN 250 MG PO TABS
500.0000 mg | ORAL_TABLET | Freq: Two times a day (BID) | ORAL | Status: DC
  Filled 2020-05-21: qty 2

## 2020-05-21 MED ORDER — CYCLOBENZAPRINE HCL 5 MG PO TABS
5.0000 mg | ORAL_TABLET | Freq: Three times a day (TID) | ORAL | Status: DC
  Administered 2020-05-21 – 2020-05-25 (×12): 5 mg via ORAL
  Filled 2020-05-21 (×12): qty 1

## 2020-05-21 NOTE — NC FL2 (Signed)
[place] LEVEL OF CARE SCREENING TOOL     IDENTIFICATION  Patient Name: Jose [name] Birthdate: 1935/10/09 Sex: male Admission Date (Current Location): 05/17/2020  [facility] and [place] Number:  Herbalist and Address:  [facility],  [place] [address], [place]      Provider Number: [id number]  Attending Physician Name and Address:  Mercy Riding, MD  Relative Name and Phone Number:  Nelda Bucks (Significant other)   [phone]    Current Level of Care: Hospital Recommended Level of Care: [place] Prior Approval Number:    Date Approved/Denied:   PASRR Number: [id number]  Discharge Plan: SNF    Current Diagnoses: Patient Active Problem List   Diagnosis Date Noted  . Acute metabolic encephalopathy 29/79/8921  . Ataxia 05/18/2020  . Thiamine deficiency 05/18/2020    Orientation RESPIRATION BLADDER Height & Weight     Self,Place  Normal External catheter Weight:   Height:     BEHAVIORAL SYMPTOMS/MOOD NEUROLOGICAL BOWEL NUTRITION STATUS   (none)  (none) Continent Diet (see d/c summary)  AMBULATORY STATUS COMMUNICATION OF NEEDS Skin   Extensive Assist Verbally Normal                       Personal Care Assistance Level of Assistance  Bathing,Feeding,Dressing Bathing Assistance: Maximum assistance Feeding assistance: Independent Dressing Assistance: Limited assistance     Functional Limitations Info  Sight,Hearing,Speech Sight Info: Adequate Hearing Info: Adequate Speech Info: Adequate    SPECIAL CARE FACTORS FREQUENCY  PT (By licensed PT),OT (By licensed OT)     PT Frequency: 5X/W OT Frequency: 5X/W            Contractures Contractures Info: Not present    Additional Factors Info  Code Status,Allergies Code Status Info: full Allergies Info: NKA           Current Medications (05/21/2020):  This is the current hospital active medication list Current  Facility-Administered Medications  Medication Dose Route Frequency Provider Last Rate Last Admin  . acetaminophen (TYLENOL) tablet 500 mg  500 mg Oral Q8H Gonfa, Taye T, MD   500 mg at 05/21/20 0510  . ALPRAZolam Duanne Moron) tablet 0.5 mg  0.5 mg Oral QHS Wendee Beavers T, MD   0.5 mg at 05/20/20 2210  . cyclobenzaprine (FLEXERIL) tablet 5 mg  5 mg Oral TID Wendee Beavers T, MD      . enoxaparin (LOVENOX) injection 40 mg  40 mg Subcutaneous Q24H Dwyane Dee, MD   40 mg at 05/21/20 1053  . folic acid (FOLVITE) tablet 1 mg  1 mg Oral Daily Dwyane Dee, MD   1 mg at 05/21/20 1052  . ketorolac (TORADOL) 15 MG/ML injection 15 mg  15 mg Intravenous Q8H PRN Wendee Beavers T, MD   15 mg at 05/21/20 1056  . labetalol (NORMODYNE) injection 10 mg  10 mg Intravenous Q2H PRN Dessa Phi, DO   10 mg at 05/19/20 0229  . melatonin tablet 5 mg  5 mg Oral QHS Wendee Beavers T, MD   5 mg at 05/20/20 2210  . multivitamin with minerals tablet 1 tablet  1 tablet Oral Daily Dwyane Dee, MD   1 tablet at 05/21/20 1052  . sodium chloride flush (NS) 0.9 % injection 3 mL  3 mL Intravenous Q12H Dwyane Dee, MD   3 mL at 05/21/20 1057  . thiamine (B-1) 250 mg in sodium chloride 0.9 % 50 mL IVPB  250 mg  Intravenous TID Wendee Beavers T, MD 100 mL/hr at 05/21/20 1059 250 mg at 05/21/20 1059   Followed by  . [START ON 05/22/2020] thiamine (B-1) injection 100 mg  100 mg Intravenous Daily Mercy Riding, MD       Followed by  . [START ON 05/27/2020] thiamine tablet 250 mg  250 mg Oral Daily Mercy Riding, MD         Discharge Medications: Please see discharge summary for a list of discharge medications.  Relevant Imaging Results:  Relevant Lab Results:   Additional Information [place], [place]

## 2020-05-21 NOTE — Progress Notes (Signed)
Physical Therapy Treatment Patient Details Name: Jose [name] MRN: [id number] DOB: 20-Mar-1935 Today's Date: 05/21/2020    History of Present Illness Jose [name] is an 85 yo male with PMH: Chronic sensorimotor axonal polyneuropathy, ongoing alcohol abuse, thiamine deficiency, arthritis, HLD, IBS, sleep apnea, spinal stenosis, chronic fatigue who presented to the ER with complaints of ongoing ataxia.    PT Comments    Pt progressing slowly; requiring assist of 2 to amb ~ 54' with RW, multimodal cues needed throughout as noted below; pt is limited by cognition as well as ataxia placing him at incr risk for falls.  Feel pt would be unsafe to return home with one person assist in his current state; will need SNF   Follow Up Recommendations  SNF     Equipment Recommendations  Rolling walker with 5" wheels    Recommendations for Other Services       Precautions / Restrictions Precautions Precautions: Fall Restrictions Weight Bearing Restrictions: No    Mobility  Bed Mobility Overal bed mobility: Needs Assistance Bed Mobility: Rolling;Sidelying to Sit Rolling: Max assist Sidelying to sit: +2 for safety/equipment;+2 for physical assistance;Max assist;Total assist       General bed mobility comments: extra time to initiaite, posterior bias when sitting; multi-modal cues for seqeuncing and  pt to self assist    Transfers Overall transfer level: Needs assistance Equipment used: Rolling walker (2 wheeled) Transfers: Sit to/from Stand Sit to Stand: Mod assist;+2 safety/equipment         General transfer comment: assist to rise and transition to RW, cues for use of UEs, trunk and hip extension. incr time and verbal cues/frequent redirection to task  Ambulation/Gait Ambulation/Gait assistance: Mod assist;+2 physical assistance;+2 safety/equipment Gait Distance (Feet): 40 Feet Assistive device: Rolling walker (2 wheeled) Gait Pattern/deviations: Wide base of  support;Ataxic;Decreased stride length Gait velocity: decreased,   General Gait Details: patient taking small and shuffling steps, placing RW far out in front, multimodal cues incr step length, position from RW, safety   Stairs             Wheelchair Mobility    Modified Rankin (Stroke Patients Only)       Balance     Sitting balance-Leahy Scale: Poor Sitting balance - Comments: posterior bias, increased pain   Standing balance support: During functional activity;Bilateral upper extremity supported Standing balance-Leahy Scale: Poor Standing balance comment: wide BOS, useof UEs and external assist required                            Cognition Arousal/Alertness: Awake/alert Behavior During Therapy: Flat affect (easily agitated) Overall Cognitive Status: Impaired/Different from baseline Area of Impairment: Orientation;Attention;Following commands;Safety/judgement                 Orientation Level: Disoriented to;Situation;Place;Time Current Attention Level: Sustained   Following Commands: Follows one step commands with increased time;Follows multi-step commands inconsistently Safety/Judgement: Decreased awareness of deficits;Decreased awareness of safety     General Comments: pt has difficulty remaining focused on task, continues to be confused and talks about things that are completely unrelated to current task or topic      Exercises      General Comments        Pertinent Vitals/Pain Pain Assessment: Faces Faces Pain Scale: Hurts even more Pain Descriptors / Indicators: Moaning;Guarding;Grimacing Pain Intervention(s): Limited activity within patient's tolerance;Monitored during session;Premedicated before session;Repositioned    Home Living  Prior Function            PT Goals (current goals can now be found in the care plan section) Acute Rehab PT Goals Patient Stated Goal: "I need relief" PT Goal  Formulation: Patient unable to participate in goal setting Time For Goal Achievement: 06/01/20 Potential to Achieve Goals: Fair Progress towards PT goals: Progressing toward goals    Frequency    Min 2X/week      PT Plan Current plan remains appropriate    Co-evaluation              AM-PAC PT "6 Clicks" Mobility   Outcome Measure  Help needed turning from your back to your side while in a flat bed without using bedrails?: A Lot Help needed moving from lying on your back to sitting on the side of a flat bed without using bedrails?: A Lot Help needed moving to and from a bed to a chair (including a wheelchair)?: A Lot Help needed standing up from a chair using your arms (e.g., wheelchair or bedside chair)?: A Lot Help needed to walk in hospital room?: Total Help needed climbing 3-5 steps with a railing? : Total 6 Click Score: 10    End of Session Equipment Utilized During Treatment: Gait belt Activity Tolerance: Patient limited by fatigue;Patient tolerated treatment well Patient left: in chair;with call bell/phone within reach;with chair alarm set;with family/visitor present   PT Visit Diagnosis: Unsteadiness on feet (R26.81);Difficulty in walking, not elsewhere classified (R26.2);Other symptoms and signs involving the nervous system (R29.898)     Time: 4010-2725 PT Time Calculation (min) (ACUTE ONLY): 26 min  Charges:  $Gait Training: 8-22 mins $Neuromuscular Re-education: 8-22 mins                     Baxter Flattery, PT  Acute Rehab Dept ([place]) [phone] Pager [phone]  05/21/2020    [facility] 05/21/2020, 12:47 PM

## 2020-05-21 NOTE — TOC Initial Note (Signed)
Transition of Care [facility]) - Initial/Assessment Note    Patient Details  Name: Jose [name] MRN: [id number] Date of Birth: [birth year]  Transition of Care [facility]) CM/SW Contact:    Trish Mage, LCSW Phone Number: 05/21/2020, 2:16 PM  Clinical Narrative:   Patient with altered mental status was seen in follow up to PT recommendation of SNF.  SO was in room and engaged in discussion.  It was clear that Mr Sasso was having a difficult time following the conversation, but he did agree to go to short term rehab, stating he is in favor of "whatever it takes."  This is definitely what his significant other wants as she is concerned that she would be totally responsible for him at d/c if he does not go.  I would not be surprised if he ultimately refuses to go to short term rehab and insists on going home, but in this moment, since he is agreeable, we will initiate a bed search. TOC will continue to follow during the course of hospitalization.                 Expected Discharge Plan: Skilled Nursing Facility Barriers to Discharge: SNF Pending bed offer   Patient Goals and CMS Choice     Choice offered to / list presented to : [facility]  Expected Discharge Plan and Services Expected Discharge Plan: [place]   Discharge Planning Services: CM Consult Post Acute Care Choice: [place] Living arrangements for the past 2 months: Single Family Home                                      Prior Living Arrangements/Services Living arrangements for the past 2 months: Single Family Home Lives with:: Self Patient language and need for interpreter reviewed:: Yes        Need for Family Participation in Patient Care: Yes (Comment) Care giver support system in place?: Yes (comment)   Criminal Activity/Legal Involvement Pertinent to Current Situation/Hospitalization: No - Comment as needed  Activities of Daily Living Home Assistive  Devices/Equipment: Eyeglasses ADL Screening (condition at time of admission) Patient's cognitive ability adequate to safely complete daily activities?: Yes Is the patient deaf or have difficulty hearing?: No Does the patient have difficulty seeing, even when wearing glasses/contacts?: No Does the patient have difficulty concentrating, remembering, or making decisions?: Yes Patient able to express need for assistance with ADLs?: Yes Does the patient have difficulty dressing or bathing?: Yes Independently performs ADLs?: No (patient reporting that he cannot walk today) Communication: Independent Dressing (OT): Needs assistance Is this a change from baseline?: Change from baseline, expected to last >3 days Grooming: Independent Feeding: Independent Bathing: Needs assistance Is this a change from baseline?: Change from baseline, expected to last >3 days Toileting: Dependent Is this a change from baseline?: Change from baseline, expected to last >3days In/Out Bed: Dependent Is this a change from baseline?: Change from baseline, expected to last >3 days Walks in Home: Dependent Is this a change from baseline?: Change from baseline, expected to last >3 days Does the patient have difficulty walking or climbing stairs?: Yes (secondary to worsening weakness) Weakness of Legs: Both Weakness of Arms/Hands: None  Permission Sought/Granted Permission sought to share information with : Family Supports Permission granted to share information with : Yes, Verbal Permission Granted  Share Information with NAME: Curtis,[name] (Significant other)   [phone]  Emotional Assessment Appearance:: Appears stated age Attitude/Demeanor/Rapport: Engaged Affect (typically observed): Other (comment) (Excited) Orientation: : Oriented to Self,Oriented to Place Alcohol / Substance Use: Alcohol Use Psych Involvement: No (comment)  Admission diagnosis:  Ataxia [R27.0] Weakness [H70.2] Acute  metabolic encephalopathy [O37.85] Patient Active Problem List   Diagnosis Date Noted  . Acute metabolic encephalopathy 88/50/2774  . Ataxia 05/18/2020  . Thiamine deficiency 05/18/2020   PCP:  Jilda Panda, MD Pharmacy:   [place], [place] - [address] [place] [address] Phone: [phone] Fax: [phone]     Social Determinants of Health (SDOH) Interventions    Readmission Risk Interventions No flowsheet data found.

## 2020-05-21 NOTE — Progress Notes (Signed)
Chaplain checked-in with [place] and friend.  Chaplain continues to offer support and presence.    05/21/20 1300  Clinical Encounter Type  Visited With Patient and family together  Visit Type Follow-up

## 2020-05-21 NOTE — Progress Notes (Signed)
PROGRESS NOTE  Jose [name] ACZ:[id number] DOB: May 12, 1935   PCP: Jose Panda, MD  Patient is from: Home.  Lives alone.  DOA: 05/17/2020 LOS: 2  Chief complaints: Ataxia  Brief Narrative / Interim history: 85 year old male history of alcohol abuse, thiamine deficiency, arthritis, OSA, spinal stenosis, chronic fatigue syndrome, IBS and HLD presented to ED with complaints of ataxia, and admitted for further evaluation and treatment.  He was seen by neurology for this problem. His vitamin B1 level was low at 7 nmol/L on 02/27/2020. NCV and EMG on 04/14/2020 concerning for chronic sensorimotor axonal polyneuropathy affecting the lower extremities.    Patient was started on high-dose IV thiamine and CIWA protocol with as needed Ativan and admitted for ataxia and possible alcohol withdrawal.  Patient continues to have confusion that seems to be waxing and waning.  He is also physically deconditioned.  Therapy recommended SNF.  Subjective: Seen and examined earlier this morning.  No major events overnight or this morning.  Continues to complain neck pain does seem to be on and off.  He is awake and alert.  He is oriented x4 except date.  However, he seems to get confused and confrontational with conversation especially with his significant other.   Objective: Vitals:   05/20/20 1316 05/20/20 2028 05/21/20 0517 05/21/20 1348  BP: (!) 140/56 (!) 159/67 104/70 109/61  Pulse: 65 83 88 82  Resp: 20 18 18 18   Temp: 97.7 F (36.5 C) 98 F (36.7 C) 98 F (36.7 C) 97.6 F (36.4 C)  TempSrc: Oral Oral  Oral  SpO2: 97% 98% 96% 98%    Intake/Output Summary (Last 24 hours) at 05/21/2020 1400 Last data filed at 05/21/2020 1347 Gross per 24 hour  Intake 982 ml  Output 875 ml  Net 107 ml   There were no vitals filed for this visit.  Examination:  GENERAL: No apparent distress.  Nontoxic. HEENT: MMM.  Vision and hearing grossly intact.  NECK: Supple.  Fair range of motion in his neck.  No  focal tenderness. RESP: On RA.  No IWOB.  Fair aeration bilaterally. CVS:  RRR. Heart sounds Jose.  ABD/GI/GU: BS+. Abd soft, NTND.  MSK/EXT:  Moves extremities. No apparent deformity. No edema.  SKIN: no apparent skin lesion or wound NEURO: Awake and alert.  Oriented x4 except date.  However, he gets confused easily.  Cranial, motor, and sensory intact.  He seems to have somewhat exaggerated patellar reflex.  No pronator drift.  Finger-to-nose seems to be intact. PSYCH: Calm.  No distress or agitation.  Procedures:  None  Microbiology summarized: [id number] and influenza PCR nonreactive.  Assessment & Plan: Acute metabolic encephalopathy-reported history of ataxia and vitamin B1 deficiency raises concern for Warnicke encephalopathy.  However, mental status seems to be waxing and waning suggesting delirium. -MRI brain without acute finding but chronic microvascular ischemic changes and parenchymal volume loss -Thiamine level placed on 7 nmol/L on 02/27/2020.  T73 and folic acid within Jose. -Continue high-dose IV thiamine-Warnicke dose  -Continue home Xanax at bedtime. -Reorientation and delirium precautions  Chronic sensorimotor axonal polyneuropathy/physical deconditioning/unsteady gait -EMG on 04/14/2020 at neurology office showed chronic sensorimotor axonal polyneuropathy affecting LE's -Therapy recommended SNF -TOC following.  Alcohol abuse/possible withdrawal: No significant withdrawal symptoms but confusion -Continue thiamine as above  Thiamine deficiency -Management as above  Chronic fatigue syndrome -Resume home Ritalin  Hyponatremia: Could be due to alcohol.  Resolved.  Hypokalemia: Resolved.  Leukopenia: Resolved.  Neck pain/cervical DDD-CT cervical spine in 2020  with healing C7 vertebral body fracture and increased finding of the interspinous distance at C6-7 and C7-T10 and multilevel cervical spondylolisthesis.  Neuro exam within Jose except for somewhat  exaggerated patellar reflex.  However, seems to have significant episodic pain mainly on the left -DG cervical spine.  May consider cervical CT or MRI -Scheduled Tylenol, Flexeril and IV Toradol -K pad   There is no height or weight on file to calculate BMI.         DVT prophylaxis:  enoxaparin (LOVENOX) injection 40 mg Start: 05/18/20 1000  Code Status: Full code Family Communication: Updated patient's significant other at bedside. Level of care: Med-Surg Status is: Inpatient  Remains inpatient appropriate because:Ongoing active pain requiring inpatient pain management, Altered mental status, Ongoing diagnostic testing needed not appropriate for outpatient work up, Unsafe d/c plan, IV treatments appropriate due to intensity of illness or inability to take PO and Inpatient level of care appropriate due to severity of illness   Dispo: The patient is from: Home              Anticipated d/c is to: SNF              Patient currently is not medically stable to d/c.   Difficult to place patient No           Consultants:  None   Sch Meds:  Scheduled Meds: . acetaminophen  500 mg Oral Q8H  . ALPRAZolam  0.5 mg Oral QHS  . cyclobenzaprine  5 mg Oral TID  . enoxaparin (LOVENOX) injection  40 mg Subcutaneous Q24H  . folic acid  1 mg Oral Daily  . melatonin  5 mg Oral QHS  . multivitamin with minerals  1 tablet Oral Daily  . sodium chloride flush  3 mL Intravenous Q12H  . [START ON 05/22/2020] thiamine injection  100 mg Intravenous Daily   Followed by  . [START ON 05/27/2020] thiamine  250 mg Oral Daily   Continuous Infusions: . thiamine injection 250 mg (05/21/20 1059)   PRN Meds:.ketorolac, labetalol  Antimicrobials: Anti-infectives (From admission, onward)   None       I have personally reviewed the following labs and images: CBC: Recent Labs  Lab 05/17/20 2000 05/18/20 0441 05/18/20 0534 05/19/20 0500 05/20/20 0527  WBC 4.3 2.7* 2.8* 6.8 6.1  NEUTROABS   --  1.5* 1.3*  --   --   HGB 14.3 13.5 13.1 14.1 14.3  HCT 42.8 39.9 39.1 41.6 42.0  MCV 91.8 92.4 91.4 90.2 90.7  PLT 159 157 153 171 187   BMP &GFR Recent Labs  Lab 05/17/20 2000 05/18/20 0441 05/18/20 0534 05/19/20 0500 05/20/20 0527  NA 134* 136 136 133* 136  K 4.0 3.3* 3.5 3.4* 4.5  CL 101 107 107 100 104  CO2 23 24 24 22 23   GLUCOSE 93 160* 132* 132* 110*  BUN 16 14 15 10 14   CREATININE 0.76 0.78 0.71 0.76 0.92  CALCIUM 9.1 8.4* 8.7* 9.3 9.4  MG  --  2.1  --  1.8 2.0  PHOS  --   --   --   --  3.4   CrCl cannot be calculated (Unknown ideal weight.). Liver & Pancreas: Recent Labs  Lab 05/17/20 2000 05/20/20 0527  AST 30  --   ALT 18  --   ALKPHOS 54  --   BILITOT 0.9  --   PROT 6.8  --   ALBUMIN 3.7 3.6   No results for  input(s): LIPASE, AMYLASE in the last 168 hours. No results for input(s): AMMONIA in the last 168 hours. Diabetic: Recent Labs    05/20/20 0527  HGBA1C 5.6   No results for input(s): GLUCAP in the last 168 hours. Cardiac Enzymes: Recent Labs  Lab 05/20/20 0527  CKTOTAL 99   No results for input(s): PROBNP in the last 8760 hours. Coagulation Profile: No results for input(s): INR, PROTIME in the last 168 hours. Thyroid Function Tests: No results for input(s): TSH, T4TOTAL, FREET4, T3FREE, THYROIDAB in the last 72 hours. Lipid Profile: No results for input(s): CHOL, HDL, LDLCALC, TRIG, CHOLHDL, LDLDIRECT in the last 72 hours. Anemia Panel: No results for input(s): VITAMINB12, FOLATE, FERRITIN, TIBC, IRON, RETICCTPCT in the last 72 hours. Urine analysis:    Component Value Date/Time   COLORURINE YELLOW 05/17/2020 2027   APPEARANCEUR CLEAR 05/17/2020 2027   LABSPEC 1.018 05/17/2020 2027   PHURINE 8.0 05/17/2020 2027   GLUCOSEU NEGATIVE 05/17/2020 2027   HGBUR NEGATIVE 05/17/2020 2027   BILIRUBINUR NEGATIVE 05/17/2020 2027   KETONESUR 80 (A) 05/17/2020 2027   PROTEINUR NEGATIVE 05/17/2020 2027   NITRITE NEGATIVE 05/17/2020 2027    LEUKOCYTESUR NEGATIVE 05/17/2020 2027   Sepsis Labs: Invalid input(s): PROCALCITONIN, LACTICIDVEN  Microbiology: Recent Results (from the past 240 hour(s))  Blood culture (routine x 2)     Status: None (Preliminary result)   Collection Time: 05/17/20  8:29 PM   Specimen: BLOOD RIGHT ARM  Result Value Ref Range Status   Specimen Description   Final    BLOOD RIGHT ARM Performed at [facility], [address] [place], [place] [address]    Special Requests   Final    BOTTLES DRAWN AEROBIC ONLY Blood Culture results may not be optimal due to an inadequate volume of blood received in culture bottles Performed at [facility], [address], [place], [place] [address]    Culture   Final    NO GROWTH 4 DAYS Performed at [facility], [address] [place], [place] [address]    Report Status PENDING  Incomplete  Blood culture (routine x 2)     Status: Abnormal (Preliminary result)   Collection Time: 05/17/20  8:34 PM   Specimen: BLOOD LEFT ARM  Result Value Ref Range Status   Specimen Description   Final    BLOOD LEFT ARM Performed at [facility], [address], [place], [place] [address]    Special Requests   Final    BOTTLES DRAWN AEROBIC AND ANAEROBIC Blood Culture adequate volume Performed at [facility], [address] [place], [place] [address]    Culture  Setup Time   Final    ANAEROBIC BOTTLE ONLY GRAM POSITIVE COCCI CRITICAL RESULT CALLED TO, READ BACK BY AND VERIFIED WITH: L POINDEXTER PHARMD 05/19/20 0035 JDW GRAM POSITIVE RODS AEROBIC BOTTLE ONLY CRITICAL RESULT CALLED TO, READ BACK BY AND VERIFIED WITH: L CURRAN PHARMD 1809 05/19/20 A BROWNING    Culture (A)  Final    STAPHYLOCOCCUS HOMINIS THE SIGNIFICANCE OF ISOLATING THIS ORGANISM FROM A SINGLE SET OF BLOOD CULTURES WHEN MULTIPLE SETS ARE DRAWN IS UNCERTAIN. PLEASE NOTIFY THE MICROBIOLOGY DEPARTMENT WITHIN ONE WEEK IF  SPECIATION AND SENSITIVITIES ARE REQUIRED. CULTURE REINCUBATED FOR BETTER GROWTH Performed at [facility], [address] [place], [place] [address]    Report Status PENDING  Incomplete  Blood Culture ID Panel (Reflexed)     Status: Abnormal  Collection Time: 05/17/20  8:34 PM  Result Value Ref Range Status   Enterococcus faecalis NOT DETECTED NOT DETECTED Final   Enterococcus Faecium NOT DETECTED NOT DETECTED Final   Listeria monocytogenes NOT DETECTED NOT DETECTED Final   Staphylococcus species DETECTED (A) NOT DETECTED Final    Comment: CRITICAL RESULT CALLED TO, READ BACK BY AND VERIFIED WITH: L POINDEXTER PHARMD 05/19/20 0035 JDW    Staphylococcus aureus (BCID) NOT DETECTED NOT DETECTED Final   Staphylococcus epidermidis NOT DETECTED NOT DETECTED Final   Staphylococcus lugdunensis NOT DETECTED NOT DETECTED Final   Streptococcus species NOT DETECTED NOT DETECTED Final   Streptococcus agalactiae NOT DETECTED NOT DETECTED Final   Streptococcus pneumoniae NOT DETECTED NOT DETECTED Final   Streptococcus pyogenes NOT DETECTED NOT DETECTED Final   A.calcoaceticus-baumannii NOT DETECTED NOT DETECTED Final   Bacteroides fragilis NOT DETECTED NOT DETECTED Final   Enterobacterales NOT DETECTED NOT DETECTED Final   Enterobacter cloacae complex NOT DETECTED NOT DETECTED Final   Escherichia coli NOT DETECTED NOT DETECTED Final   Klebsiella aerogenes NOT DETECTED NOT DETECTED Final   Klebsiella oxytoca NOT DETECTED NOT DETECTED Final   Klebsiella pneumoniae NOT DETECTED NOT DETECTED Final   Proteus species NOT DETECTED NOT DETECTED Final   Salmonella species NOT DETECTED NOT DETECTED Final   Serratia marcescens NOT DETECTED NOT DETECTED Final   Haemophilus influenzae NOT DETECTED NOT DETECTED Final   Neisseria meningitidis NOT DETECTED NOT DETECTED Final   Pseudomonas aeruginosa NOT DETECTED NOT DETECTED Final   Stenotrophomonas maltophilia NOT DETECTED NOT DETECTED Final   Candida  albicans NOT DETECTED NOT DETECTED Final   Candida auris NOT DETECTED NOT DETECTED Final   Candida glabrata NOT DETECTED NOT DETECTED Final   Candida krusei NOT DETECTED NOT DETECTED Final   Candida parapsilosis NOT DETECTED NOT DETECTED Final   Candida tropicalis NOT DETECTED NOT DETECTED Final   Cryptococcus neoformans/gattii NOT DETECTED NOT DETECTED Final    Comment: Performed at [facility], [address], [place], [place] [address]  Resp Panel by RT-PCR (Flu A&B, Covid) Nasopharyngeal Swab     Status: None   Collection Time: 05/17/20  9:06 PM   Specimen: Nasopharyngeal Swab; Nasopharyngeal(NP) swabs in vial transport medium  Result Value Ref Range Status   SARS Coronavirus 2 by RT PCR NEGATIVE NEGATIVE Final    Comment: (NOTE) SARS-CoV-2 target nucleic acids are NOT DETECTED.  The SARS-CoV-2 RNA is generally detectable in upper respiratory specimens during the acute phase of infection. The lowest concentration of SARS-CoV-2 viral copies this assay can detect is 138 copies/mL. A negative result does not preclude SARS-Cov-2 infection and should not be used as the sole basis for treatment or other patient management decisions. A negative result may occur with  improper specimen collection/handling, submission of specimen other than nasopharyngeal swab, presence of viral mutation(s) within the areas targeted by this assay, and inadequate number of viral copies(<138 copies/mL). A negative result must be combined with clinical observations, patient history, and epidemiological information. The expected result is Negative.  Fact Sheet for Patients:  EntrepreneurPulse.com.au  Fact Sheet for Healthcare Providers:  IncredibleEmployment.be  This test is no t yet approved or cleared by the [place] FDA and  has been authorized for detection and/or diagnosis of SARS-CoV-2 by FDA under an Emergency Use Authorization (EUA). This EUA will  remain  in effect (meaning this test can be used) for the duration of the COVID-19 declaration under Section 564(b)(1) of the Act, 21 U.S.C.section 360bbb-3(b)(1),  unless the authorization is terminated  or revoked sooner.       Influenza A by PCR NEGATIVE NEGATIVE Final   Influenza B by PCR NEGATIVE NEGATIVE Final    Comment: (NOTE) The Xpert Xpress SARS-CoV-2/FLU/RSV plus assay is intended as an aid in the diagnosis of influenza from Nasopharyngeal swab specimens and should not be used as a sole basis for treatment. Nasal washings and aspirates are unacceptable for Xpert Xpress SARS-CoV-2/FLU/RSV testing.  Fact Sheet for Patients: EntrepreneurPulse.com.au  Fact Sheet for Healthcare Providers: IncredibleEmployment.be  This test is not yet approved or cleared by the [place] FDA and has been authorized for detection and/or diagnosis of SARS-CoV-2 by FDA under an Emergency Use Authorization (EUA). This EUA will remain in effect (meaning this test can be used) for the duration of the COVID-19 declaration under Section 564(b)(1) of the Act, 21 U.S.C. section 360bbb-3(b)(1), unless the authorization is terminated or revoked.  Performed at [facility], [place] [address], [place], [place] [address]     Radiology Studies: No results found.    Jose [name] Jose [name]  If 7PM-7AM, please contact night-coverage [email or web address] 05/21/2020, 2:00 PM

## 2020-05-21 NOTE — Care Management Important Message (Signed)
Important Message  Patient Details IM Letter given to the Patient. Name: Jose [name] MRN: [id number] Date of Birth: 07/12/1935   Medicare Important Message Given:  Yes     Kerin Salen 05/21/2020, 10:35 AM

## 2020-05-22 DIAGNOSIS — R531 Weakness: Secondary | ICD-10-CM | POA: Diagnosis not present

## 2020-05-22 DIAGNOSIS — G9341 Metabolic encephalopathy: Secondary | ICD-10-CM | POA: Diagnosis not present

## 2020-05-22 DIAGNOSIS — E519 Thiamine deficiency, unspecified: Secondary | ICD-10-CM | POA: Diagnosis not present

## 2020-05-22 DIAGNOSIS — R270 Ataxia, unspecified: Secondary | ICD-10-CM | POA: Diagnosis not present

## 2020-05-22 LAB — CULTURE, BLOOD (ROUTINE X 2): Culture: NO GROWTH

## 2020-05-22 NOTE — Progress Notes (Signed)
PROGRESS NOTE  Jose [name] ZOX:[id number] DOB: 02/03/1935   PCP: Ralene OkMoreira, Roy, MD  Patient is from: Home.  Lives alone.  DOA: 05/17/2020 LOS: 3  Chief complaints: Ataxia  Brief Narrative / Interim history: 85 year old male history of alcohol abuse, thiamine deficiency, arthritis, OSA, spinal stenosis, chronic fatigue syndrome, IBS and HLD presented to ED with complaints of ataxia, and admitted for further evaluation and treatment.  He was seen by neurology for this problem. His vitamin B1 level was low at 7 nmol/L on 02/27/2020. NCV and EMG on 04/14/2020 concerning for chronic sensorimotor axonal polyneuropathy affecting the lower extremities.    Patient was started on high-dose IV thiamine and CIWA protocol with as needed Ativan and admitted for ataxia and possible alcohol withdrawal.  Patient continues to have confusion that seems to be waxing and waning.  He is also physically deconditioned.  Therapy recommended SNF.  TOC working on this.  Subjective: Seen and examined earlier this morning.  No major events overnight of this morning.  Continues to endorse intermittent neck pain that he describes as "mild".  He said this is not new for him.  He was eager to go home but willing to go to rehab for acute therapy after discussion with his significant other.  Objective: Vitals:   05/21/20 1348 05/21/20 2010 05/22/20 0534 05/22/20 1337  BP: 109/61 (!) 115/45 121/84 115/69  Pulse: 82 61 92 80  Resp: 18 18 18 16   Temp: 97.6 F (36.4 C) (!) 97.4 F (36.3 C) (!) 97.4 F (36.3 C) (!) 97.3 F (36.3 C)  TempSrc: Oral Oral Oral Oral  SpO2: 98% 96% 97% 97%    Intake/Output Summary (Last 24 hours) at 05/22/2020 1427 Last data filed at 05/22/2020 0942 Gross per 24 hour  Intake 716 ml  Output 1200 ml  Net -484 ml   There were no vitals filed for this visit.  Examination:  GENERAL: No apparent distress.  Nontoxic. HEENT: MMM.  Vision and hearing grossly intact.  NECK: Supple.  No  apparent JVD.  RESP: On RA.  No IWOB.  Fair aeration bilaterally. CVS:  RRR. Heart sounds normal.  ABD/GI/GU: BS+. Abd soft, NTND.  MSK/EXT:  Moves extremities. No apparent deformity. No edema.  SKIN: no apparent skin lesion or wound NEURO: Awake, alert and oriented appropriately.  No apparent focal neuro deficit. PSYCH: Calm. Normal affect.  Procedures:  None  Microbiology summarized: COVID-19 and influenza PCR nonreactive.  Assessment & Plan: Acute metabolic encephalopathy-reported history of ataxia and vitamin B1 deficiency raises concern for Warnicke encephalopathy.  Mental status improved although seems to be waxing and waning. -MRI brain without acute finding but chronic microvascular ischemic changes and parenchymal volume loss -Thiamine level placed on 7 nmol/L on 02/27/2020.  B12 and folic acid within normal. -Continue  IV thiamine-for possible Warnicke -Continue home Xanax at bedtime. -Reorientation and delirium precautions  Chronic sensorimotor axonal polyneuropathy/physical deconditioning/unsteady gait -EMG on 04/14/2020 at neurology office showed chronic sensorimotor axonal polyneuropathy affecting LE's -Therapy recommended SNF.  Now in agreement.  Alcohol abuse/possible withdrawal: No significant withdrawal symptoms but confusion -Continue thiamine as above  Thiamine deficiency -Management as above  Chronic fatigue syndrome -Resume home Ritalin  Hyponatremia: Could be due to alcohol.  Resolved.  Hypokalemia: Resolved.  Leukopenia: Resolved.  Neck pain/cervical DDD-CT cervical spine in 2020 with healing C7 vertebral body fracture and increased finding of the interspinous distance at C6-7 and C7-T10 and multilevel cervical spondylolisthesis.  Neuro exam within normal except for somewhat exaggerated  patellar reflex.  X-ray with significant degenerative disease. -Scheduled Tylenol, Flexeril and IV Toradol -K pad   There is no height or weight on file to  calculate BMI.         DVT prophylaxis:  enoxaparin (LOVENOX) injection 40 mg Start: 05/18/20 1000  Code Status: Full code Family Communication: Updated patient's significant other at bedside. Level of care: Med-Surg Status is: Inpatient  Remains inpatient appropriate because:Unsafe d/c plan   Dispo: The patient is from: Home              Anticipated d/c is to: SNF              Patient currently is medically stable to d/c.   Difficult to place patient No           Consultants:  None   Sch Meds:  Scheduled Meds: . acetaminophen  500 mg Oral Q8H  . ALPRAZolam  0.5 mg Oral QHS  . cyclobenzaprine  5 mg Oral TID  . enoxaparin (LOVENOX) injection  40 mg Subcutaneous Q24H  . folic acid  1 mg Oral Daily  . melatonin  5 mg Oral QHS  . multivitamin with minerals  1 tablet Oral Daily  . sodium chloride flush  3 mL Intravenous Q12H  . thiamine injection  100 mg Intravenous Daily   Followed by  . [START ON 05/27/2020] thiamine  250 mg Oral Daily   Continuous Infusions:  PRN Meds:.ketorolac, labetalol  Antimicrobials: Anti-infectives (From admission, onward)   None       I have personally reviewed the following labs and images: CBC: Recent Labs  Lab 05/17/20 2000 05/18/20 0441 05/18/20 0534 05/19/20 0500 05/20/20 0527  WBC 4.3 2.7* 2.8* 6.8 6.1  NEUTROABS  --  1.5* 1.3*  --   --   HGB 14.3 13.5 13.1 14.1 14.3  HCT 42.8 39.9 39.1 41.6 42.0  MCV 91.8 92.4 91.4 90.2 90.7  PLT 159 157 153 171 187   BMP &GFR Recent Labs  Lab 05/17/20 2000 05/18/20 0441 05/18/20 0534 05/19/20 0500 05/20/20 0527  NA 134* 136 136 133* 136  K 4.0 3.3* 3.5 3.4* 4.5  CL 101 107 107 100 104  CO2 23 24 24 22 23   GLUCOSE 93 160* 132* 132* 110*  BUN 16 14 15 10 14   CREATININE 0.76 0.78 0.71 0.76 0.92  CALCIUM 9.1 8.4* 8.7* 9.3 9.4  MG  --  2.1  --  1.8 2.0  PHOS  --   --   --   --  3.4   CrCl cannot be calculated (Unknown ideal weight.). Liver & Pancreas: Recent Labs   Lab 05/17/20 2000 05/20/20 0527  AST 30  --   ALT 18  --   ALKPHOS 54  --   BILITOT 0.9  --   PROT 6.8  --   ALBUMIN 3.7 3.6   No results for input(s): LIPASE, AMYLASE in the last 168 hours. No results for input(s): AMMONIA in the last 168 hours. Diabetic: Recent Labs    05/20/20 0527  HGBA1C 5.6   No results for input(s): GLUCAP in the last 168 hours. Cardiac Enzymes: Recent Labs  Lab 05/20/20 0527  CKTOTAL 99   No results for input(s): PROBNP in the last 8760 hours. Coagulation Profile: No results for input(s): INR, PROTIME in the last 168 hours. Thyroid Function Tests: No results for input(s): TSH, T4TOTAL, FREET4, T3FREE, THYROIDAB in the last 72 hours. Lipid Profile: No results for input(s): CHOL, HDL,  LDLCALC, TRIG, CHOLHDL, LDLDIRECT in the last 72 hours. Anemia Panel: No results for input(s): VITAMINB12, FOLATE, FERRITIN, TIBC, IRON, RETICCTPCT in the last 72 hours. Urine analysis:    Component Value Date/Time   COLORURINE YELLOW 05/17/2020 2027   APPEARANCEUR CLEAR 05/17/2020 2027   LABSPEC 1.018 05/17/2020 2027   PHURINE 8.0 05/17/2020 2027   GLUCOSEU NEGATIVE 05/17/2020 2027   HGBUR NEGATIVE 05/17/2020 2027   BILIRUBINUR NEGATIVE 05/17/2020 2027   KETONESUR 80 (A) 05/17/2020 2027   PROTEINUR NEGATIVE 05/17/2020 2027   NITRITE NEGATIVE 05/17/2020 2027   LEUKOCYTESUR NEGATIVE 05/17/2020 2027   Sepsis Labs: Invalid input(s): PROCALCITONIN, [place]  Microbiology: Recent Results (from the past 240 hour(s))  Blood culture (routine x 2)     Status: None   Collection Time: 05/17/20  8:29 PM   Specimen: BLOOD RIGHT ARM  Result Value Ref Range Status   Specimen Description   Final    BLOOD RIGHT ARM Performed at [facility], [place] [address], [place], [place] [address]    Special Requests   Final    BOTTLES DRAWN AEROBIC ONLY Blood Culture results may not be optimal due to an inadequate volume of blood received in culture  bottles Performed at [place] [address], [place], [place] [address]    Culture   Final    NO GROWTH 5 DAYS Performed at [facility], [place] [address], [place], [place] [address]    Report Status 05/22/2020 FINAL  Final  Blood culture (routine x 2)     Status: Abnormal (Preliminary result)   Collection Time: 05/17/20  8:34 PM   Specimen: BLOOD LEFT ARM  Result Value Ref Range Status   Specimen Description   Final    BLOOD LEFT ARM Performed at [place] [address] [place], [place] [address]    Special Requests   Final    BOTTLES DRAWN AEROBIC AND ANAEROBIC Blood Culture adequate volume Performed at [place] [address] [place], [place] [address]    Culture  Setup Time   Final    ANAEROBIC BOTTLE ONLY GRAM POSITIVE COCCI CRITICAL RESULT CALLED TO, READ BACK BY AND VERIFIED WITH: L POINDEXTER PHARMD 05/19/20 0035 JDW GRAM POSITIVE RODS AEROBIC BOTTLE ONLY CRITICAL RESULT CALLED TO, READ BACK BY AND VERIFIED WITH: L CURRAN PHARMD 1809 05/19/20 A BROWNING    Culture (A)  Final    STAPHYLOCOCCUS HOMINIS THE SIGNIFICANCE OF ISOLATING THIS ORGANISM FROM A SINGLE SET OF BLOOD CULTURES WHEN MULTIPLE SETS ARE DRAWN IS UNCERTAIN. PLEASE NOTIFY THE MICROBIOLOGY DEPARTMENT WITHIN ONE WEEK IF SPECIATION AND SENSITIVITIES ARE REQUIRED. CULTURE REINCUBATED FOR BETTER GROWTH Performed at [facility], [place] [address], [place], [place] [address]    Report Status PENDING  Incomplete  Blood Culture ID Panel (Reflexed)     Status: Abnormal   Collection Time: 05/17/20  8:34 PM  Result Value Ref Range Status   Enterococcus faecalis NOT DETECTED NOT DETECTED Final   Enterococcus Faecium NOT DETECTED NOT DETECTED Final   Listeria monocytogenes NOT DETECTED NOT DETECTED Final   Staphylococcus species DETECTED (A) NOT DETECTED Final    Comment: CRITICAL RESULT CALLED TO, READ BACK BY AND VERIFIED WITH: L  POINDEXTER PHARMD 05/19/20 0035 JDW    Staphylococcus aureus (BCID) NOT DETECTED NOT DETECTED Final   Staphylococcus epidermidis NOT DETECTED NOT DETECTED Final   Staphylococcus lugdunensis NOT DETECTED NOT DETECTED Final   Streptococcus species NOT DETECTED  NOT DETECTED Final   Streptococcus agalactiae NOT DETECTED NOT DETECTED Final   Streptococcus pneumoniae NOT DETECTED NOT DETECTED Final   Streptococcus pyogenes NOT DETECTED NOT DETECTED Final   A.calcoaceticus-baumannii NOT DETECTED NOT DETECTED Final   Bacteroides fragilis NOT DETECTED NOT DETECTED Final   Enterobacterales NOT DETECTED NOT DETECTED Final   Enterobacter cloacae complex NOT DETECTED NOT DETECTED Final   Escherichia coli NOT DETECTED NOT DETECTED Final   Klebsiella aerogenes NOT DETECTED NOT DETECTED Final   Klebsiella oxytoca NOT DETECTED NOT DETECTED Final   Klebsiella pneumoniae NOT DETECTED NOT DETECTED Final   Proteus species NOT DETECTED NOT DETECTED Final   Salmonella species NOT DETECTED NOT DETECTED Final   Serratia marcescens NOT DETECTED NOT DETECTED Final   Haemophilus influenzae NOT DETECTED NOT DETECTED Final   Neisseria meningitidis NOT DETECTED NOT DETECTED Final   Pseudomonas aeruginosa NOT DETECTED NOT DETECTED Final   Stenotrophomonas maltophilia NOT DETECTED NOT DETECTED Final   Candida albicans NOT DETECTED NOT DETECTED Final   Candida auris NOT DETECTED NOT DETECTED Final   Candida glabrata NOT DETECTED NOT DETECTED Final   Candida krusei NOT DETECTED NOT DETECTED Final   Candida parapsilosis NOT DETECTED NOT DETECTED Final   Candida tropicalis NOT DETECTED NOT DETECTED Final   Cryptococcus neoformans/gattii NOT DETECTED NOT DETECTED Final    Comment: Performed at [facility], [address] [place], [place] [address]  Resp Panel by RT-PCR (Flu A&B, Covid) Nasopharyngeal Swab     Status: None   Collection Time: 05/17/20  9:06 PM   Specimen: Nasopharyngeal Swab; Nasopharyngeal(NP)  swabs in vial transport medium  Result Value Ref Range Status   SARS Coronavirus 2 by RT PCR NEGATIVE NEGATIVE Final    Comment: (NOTE) SARS-CoV-2 target nucleic acids are NOT DETECTED.  The SARS-CoV-2 RNA is generally detectable in upper respiratory specimens during the acute phase of infection. The lowest concentration of SARS-CoV-2 viral copies this assay can detect is 138 copies/mL. A negative result does not preclude SARS-Cov-2 infection and should not be used as the sole basis for treatment or other patient management decisions. A negative result may occur with  improper specimen collection/handling, submission of specimen other than nasopharyngeal swab, presence of viral mutation(s) within the areas targeted by this assay, and inadequate number of viral copies(<138 copies/mL). A negative result must be combined with clinical observations, patient history, and epidemiological information. The expected result is Negative.  Fact Sheet for Patients:  EntrepreneurPulse.com.au  Fact Sheet for Healthcare Providers:  IncredibleEmployment.be  This test is no t yet approved or cleared by the [place] FDA and  has been authorized for detection and/or diagnosis of SARS-CoV-2 by FDA under an Emergency Use Authorization (EUA). This EUA will remain  in effect (meaning this test can be used) for the duration of the COVID-19 declaration under Section 564(b)(1) of the Act, 21 U.S.C.section 360bbb-3(b)(1), unless the authorization is terminated  or revoked sooner.       Influenza A by PCR NEGATIVE NEGATIVE Final   Influenza B by PCR NEGATIVE NEGATIVE Final    Comment: (NOTE) The Xpert Xpress SARS-CoV-2/FLU/RSV plus assay is intended as an aid in the diagnosis of influenza from Nasopharyngeal swab specimens and should not be used as a sole basis for treatment. Nasal washings and aspirates are unacceptable for Xpert Xpress  SARS-CoV-2/FLU/RSV testing.  Fact Sheet for Patients: EntrepreneurPulse.com.au  Fact Sheet for Healthcare Providers: IncredibleEmployment.be  This test is not yet approved or cleared by the [place]  States FDA and has been authorized for detection and/or diagnosis of SARS-CoV-2 by FDA under an Emergency Use Authorization (EUA). This EUA will remain in effect (meaning this test can be used) for the duration of the COVID-19 declaration under Section 564(b)(1) of the Act, 21 U.S.C. section 360bbb-3(b)(1), unless the authorization is terminated or revoked.  Performed at [facility] [place] [address], [place], [place] [address]     Radiology Studies: No results found.    Jadier Rockers T. [place]  If 7PM-7AM, please contact night-coverage [email or web address] 05/22/2020, 2:27 PM

## 2020-05-22 NOTE — Progress Notes (Signed)
Patient agreeable to have new IV site put in. Patient took all his morning medications. No inappropriate language at this time. Will continue to monitor.

## 2020-05-22 NOTE — Progress Notes (Signed)
Patient climbing out of bed without assistance, yelling at staff, using cuss words towards Nurse tech. Will redirect and keep patient safe.

## 2020-05-23 DIAGNOSIS — G9341 Metabolic encephalopathy: Secondary | ICD-10-CM | POA: Diagnosis not present

## 2020-05-23 DIAGNOSIS — E519 Thiamine deficiency, unspecified: Secondary | ICD-10-CM | POA: Diagnosis not present

## 2020-05-23 DIAGNOSIS — R531 Weakness: Secondary | ICD-10-CM | POA: Diagnosis not present

## 2020-05-23 DIAGNOSIS — R270 Ataxia, unspecified: Secondary | ICD-10-CM | POA: Diagnosis not present

## 2020-05-23 NOTE — Progress Notes (Signed)
PROGRESS NOTE  Jose [name] ERX:[id number] DOB: 02-Dec-1935   PCP: Jilda Panda, MD  Patient is from: Home.  Lives alone.  DOA: 05/17/2020 LOS: 4  Chief complaints: Ataxia  Brief Narrative / Interim history: 85 year old male history of alcohol abuse, thiamine deficiency, arthritis, OSA, spinal stenosis, chronic fatigue syndrome, IBS and HLD presented to ED with complaints of ataxia, and admitted for further evaluation and treatment.  He was seen by neurology for this problem. His vitamin B1 level was low at 7 nmol/L on 02/27/2020. NCV and EMG on 04/14/2020 concerning for chronic sensorimotor axonal polyneuropathy affecting the lower extremities.    Patient was started on high-dose IV thiamine and CIWA protocol with as needed Ativan and admitted for ataxia and possible alcohol withdrawal.  Patient continues to have confusion that seems to be waxing and waning.  He is also physically deconditioned.  Therapy recommended SNF.  TOC working on this.  Subjective: Seen and examined earlier this morning.  No major events overnight of this morning.  No complaints.  Neck pain improved.  Objective: Vitals:   05/22/20 0534 05/22/20 1337 05/22/20 2019 05/23/20 0519  BP: 121/84 115/69 115/66 100/62  Pulse: 92 80 80 77  Resp: 18 16 18 20   Temp: (!) 97.4 F (36.3 C) (!) 97.3 F (36.3 C) 98.2 F (36.8 C) (!) 97.3 F (36.3 C)  TempSrc: Oral Oral Oral Oral  SpO2: 97% 97% 96% 95%    Intake/Output Summary (Last 24 hours) at 05/23/2020 1212 Last data filed at 05/23/2020 0900 Gross per 24 hour  Intake 720 ml  Output 1300 ml  Net -580 ml   There were no vitals filed for this visit.  Examination:  GENERAL: No apparent distress.  Nontoxic. HEENT: MMM.  Vision and hearing grossly intact.  NECK: Supple.  No apparent JVD.  RESP:  No IWOB.  Fair aeration bilaterally. CVS:  RRR. Heart sounds normal.  ABD/GI/GU: BS+. Abd soft, NTND.  MSK/EXT:  Moves extremities. No apparent deformity. No edema.   SKIN: no apparent skin lesion or wound NEURO: Awake, alert and oriented appropriately.  No apparent focal neuro deficit. PSYCH: Calm. Normal affect.  Procedures:  None  Microbiology summarized: [id number] and influenza PCR nonreactive.  Assessment & Plan: Acute metabolic encephalopathy-reported history of ataxia and vitamin B1 deficiency raises concern for Warnicke encephalopathy.  Mental status improved although seems to be waxing and waning. -MRI brain without acute finding but chronic microvascular ischemic changes and parenchymal volume loss -Thiamine level placed on 7 nmol/L on 02/27/2020.  Z12 and folic acid within normal. -Completing Wernicke dose IV thiamine. -Continue home Xanax at bedtime. -Reorientation and delirium precautions  Chronic sensorimotor axonal polyneuropathy/physical deconditioning/unsteady gait -EMG on 04/14/2020 at neurology office showed chronic sensorimotor axonal polyneuropathy affecting LE's -Therapy recommended SNF.  Now in agreement.  TOC working on this.  Alcohol abuse/possible withdrawal: No significant withdrawal symptoms.  -Continue thiamine as above  Thiamine deficiency -Management as above  Chronic fatigue syndrome -Resume home Ritalin  Hyponatremia: Could be due to alcohol.  Resolved.  Hypokalemia: Resolved.  Leukopenia: Resolved.  Neck pain/cervical DDD-CT cervical spine in 2020 with healing C7 vertebral body fracture and increased finding of the interspinous distance at C6-7 and C7-T10 and multilevel cervical spondylolisthesis.  Neuro exam within normal except for somewhat exaggerated patellar reflex.  X-ray with significant degenerative disease.  Pain improved. -Scheduled Tylenol, Flexeril and IV Toradol -K pad   There is no height or weight on file to calculate BMI.  DVT prophylaxis:  enoxaparin (LOVENOX) injection 40 mg Start: 05/18/20 1000  Code Status: Full code Family Communication: Updated patient's significant  other at bedside on 4/30. Level of care: Med-Surg Status is: Inpatient  Remains inpatient appropriate because:Unsafe d/c plan   Dispo: The patient is from: Home              Anticipated d/c is to: SNF              Patient currently is medically stable to d/c.   Difficult to place patient No           Consultants:  None   Sch Meds:  Scheduled Meds: . acetaminophen  500 mg Oral Q8H  . ALPRAZolam  0.5 mg Oral QHS  . cyclobenzaprine  5 mg Oral TID  . enoxaparin (LOVENOX) injection  40 mg Subcutaneous Q24H  . folic acid  1 mg Oral Daily  . melatonin  5 mg Oral QHS  . multivitamin with minerals  1 tablet Oral Daily  . sodium chloride flush  3 mL Intravenous Q12H  . thiamine injection  100 mg Intravenous Daily   Followed by  . [START ON 05/27/2020] thiamine  250 mg Oral Daily   Continuous Infusions:  PRN Meds:.ketorolac, labetalol  Antimicrobials: Anti-infectives (From admission, onward)   None       I have personally reviewed the following labs and images: CBC: Recent Labs  Lab 05/17/20 2000 05/18/20 0441 05/18/20 0534 05/19/20 0500 05/20/20 0527  WBC 4.3 2.7* 2.8* 6.8 6.1  NEUTROABS  --  1.5* 1.3*  --   --   HGB 14.3 13.5 13.1 14.1 14.3  HCT 42.8 39.9 39.1 41.6 42.0  MCV 91.8 92.4 91.4 90.2 90.7  PLT 159 157 153 171 187   BMP &GFR Recent Labs  Lab 05/17/20 2000 05/18/20 0441 05/18/20 0534 05/19/20 0500 05/20/20 0527  NA 134* 136 136 133* 136  K 4.0 3.3* 3.5 3.4* 4.5  CL 101 107 107 100 104  CO2 23 24 24 22 23   GLUCOSE 93 160* 132* 132* 110*  BUN 16 14 15 10 14   CREATININE 0.76 0.78 0.71 0.76 0.92  CALCIUM 9.1 8.4* 8.7* 9.3 9.4  MG  --  2.1  --  1.8 2.0  PHOS  --   --   --   --  3.4   CrCl cannot be calculated (Unknown ideal weight.). Liver & Pancreas: Recent Labs  Lab 05/17/20 2000 05/20/20 0527  AST 30  --   ALT 18  --   ALKPHOS 54  --   BILITOT 0.9  --   PROT 6.8  --   ALBUMIN 3.7 3.6   No results for input(s): LIPASE,  AMYLASE in the last 168 hours. No results for input(s): AMMONIA in the last 168 hours. Diabetic: No results for input(s): HGBA1C in the last 72 hours. No results for input(s): GLUCAP in the last 168 hours. Cardiac Enzymes: Recent Labs  Lab 05/20/20 0527  CKTOTAL 99   No results for input(s): PROBNP in the last 8760 hours. Coagulation Profile: No results for input(s): INR, PROTIME in the last 168 hours. Thyroid Function Tests: No results for input(s): TSH, T4TOTAL, FREET4, T3FREE, THYROIDAB in the last 72 hours. Lipid Profile: No results for input(s): CHOL, HDL, LDLCALC, TRIG, CHOLHDL, LDLDIRECT in the last 72 hours. Anemia Panel: No results for input(s): VITAMINB12, FOLATE, FERRITIN, TIBC, IRON, RETICCTPCT in the last 72 hours. Urine analysis:    Component Value Date/Time   COLORURINE  YELLOW 05/17/2020 2027   APPEARANCEUR CLEAR 05/17/2020 2027   LABSPEC 1.018 05/17/2020 2027   PHURINE 8.0 05/17/2020 2027   GLUCOSEU NEGATIVE 05/17/2020 2027   HGBUR NEGATIVE 05/17/2020 2027   BILIRUBINUR NEGATIVE 05/17/2020 2027   KETONESUR 80 (A) 05/17/2020 2027   PROTEINUR NEGATIVE 05/17/2020 2027   NITRITE NEGATIVE 05/17/2020 2027   LEUKOCYTESUR NEGATIVE 05/17/2020 2027   Sepsis Labs: Invalid input(s): PROCALCITONIN, [place]  Microbiology: Recent Results (from the past 240 hour(s))  Blood culture (routine x 2)     Status: None   Collection Time: 05/17/20  8:29 PM   Specimen: BLOOD RIGHT ARM  Result Value Ref Range Status   Specimen Description   Final    BLOOD RIGHT ARM Performed at [facility], [place] [address], [place], [place] [address]    Special Requests   Final    BOTTLES DRAWN AEROBIC ONLY Blood Culture results may not be optimal due to an inadequate volume of blood received in culture bottles Performed at [place] [address], [place], [place] [address]    Culture   Final    NO GROWTH 5 DAYS Performed at [facility], [place] [address], [place], [place] [address]    Report Status 05/22/2020 FINAL  Final  Blood culture (routine x 2)     Status: Abnormal (Preliminary result)   Collection Time: 05/17/20  8:34 PM   Specimen: BLOOD LEFT ARM  Result Value Ref Range Status   Specimen Description   Final    BLOOD LEFT ARM Performed at [place] [address], [place], [place] [address]    Special Requests   Final    BOTTLES DRAWN AEROBIC AND ANAEROBIC Blood Culture adequate volume Performed at [place] [address], [place], [place] [address]    Culture  Setup Time   Final    ANAEROBIC BOTTLE ONLY GRAM POSITIVE COCCI CRITICAL RESULT CALLED TO, READ BACK BY AND VERIFIED WITH: L POINDEXTER PHARMD 05/19/20 0035 JDW GRAM POSITIVE RODS AEROBIC BOTTLE ONLY CRITICAL RESULT CALLED TO, READ BACK BY AND VERIFIED WITH: L CURRAN PHARMD 1809 05/19/20 A BROWNING    Culture (A)  Final    STAPHYLOCOCCUS HOMINIS THE SIGNIFICANCE OF ISOLATING THIS ORGANISM FROM A SINGLE SET OF BLOOD CULTURES WHEN MULTIPLE SETS ARE DRAWN IS UNCERTAIN. PLEASE NOTIFY THE MICROBIOLOGY DEPARTMENT WITHIN ONE WEEK IF SPECIATION AND SENSITIVITIES ARE REQUIRED. CULTURE REINCUBATED FOR BETTER GROWTH Performed at [facility], [place] [address] [place], [place] [address]    Report Status PENDING  Incomplete  Blood Culture ID Panel (Reflexed)     Status: Abnormal   Collection Time: 05/17/20  8:34 PM  Result Value Ref Range Status   Enterococcus faecalis NOT DETECTED NOT DETECTED Final   Enterococcus Faecium NOT DETECTED NOT DETECTED Final   Listeria monocytogenes NOT DETECTED NOT DETECTED Final   Staphylococcus species DETECTED (A) NOT DETECTED Final    Comment: CRITICAL RESULT CALLED TO, READ BACK BY AND VERIFIED WITH: L POINDEXTER PHARMD 05/19/20 0035 JDW    Staphylococcus aureus (BCID) NOT DETECTED NOT DETECTED Final   Staphylococcus epidermidis NOT DETECTED NOT DETECTED Final    Staphylococcus lugdunensis NOT DETECTED NOT DETECTED Final   Streptococcus species NOT DETECTED NOT DETECTED Final   Streptococcus agalactiae NOT DETECTED NOT DETECTED Final   Streptococcus pneumoniae NOT DETECTED NOT DETECTED Final   Streptococcus pyogenes NOT DETECTED NOT DETECTED Final   A.calcoaceticus-baumannii NOT DETECTED NOT DETECTED  Final   Bacteroides fragilis NOT DETECTED NOT DETECTED Final   Enterobacterales NOT DETECTED NOT DETECTED Final   Enterobacter cloacae complex NOT DETECTED NOT DETECTED Final   Escherichia coli NOT DETECTED NOT DETECTED Final   Klebsiella aerogenes NOT DETECTED NOT DETECTED Final   Klebsiella oxytoca NOT DETECTED NOT DETECTED Final   Klebsiella pneumoniae NOT DETECTED NOT DETECTED Final   Proteus species NOT DETECTED NOT DETECTED Final   Salmonella species NOT DETECTED NOT DETECTED Final   Serratia marcescens NOT DETECTED NOT DETECTED Final   Haemophilus influenzae NOT DETECTED NOT DETECTED Final   Neisseria meningitidis NOT DETECTED NOT DETECTED Final   Pseudomonas aeruginosa NOT DETECTED NOT DETECTED Final   Stenotrophomonas maltophilia NOT DETECTED NOT DETECTED Final   Candida albicans NOT DETECTED NOT DETECTED Final   Candida auris NOT DETECTED NOT DETECTED Final   Candida glabrata NOT DETECTED NOT DETECTED Final   Candida krusei NOT DETECTED NOT DETECTED Final   Candida parapsilosis NOT DETECTED NOT DETECTED Final   Candida tropicalis NOT DETECTED NOT DETECTED Final   Cryptococcus neoformans/gattii NOT DETECTED NOT DETECTED Final    Comment: Performed at [facility], [place] [address] [place], [place] [address]  Resp Panel by RT-PCR (Flu A&B, Covid) Nasopharyngeal Swab     Status: None   Collection Time: 05/17/20  9:06 PM   Specimen: Nasopharyngeal Swab; Nasopharyngeal(NP) swabs in vial transport medium  Result Value Ref Range Status   SARS Coronavirus 2 by RT PCR NEGATIVE NEGATIVE Final    Comment: (NOTE) SARS-CoV-2 target nucleic  acids are NOT DETECTED.  The SARS-CoV-2 RNA is generally detectable in upper respiratory specimens during the acute phase of infection. The lowest concentration of SARS-CoV-2 viral copies this assay can detect is 138 copies/mL. A negative result does not preclude SARS-Cov-2 infection and should not be used as the sole basis for treatment or other patient management decisions. A negative result may occur with  improper specimen collection/handling, submission of specimen other than nasopharyngeal swab, presence of viral mutation(s) within the areas targeted by this assay, and inadequate number of viral copies(<138 copies/mL). A negative result must be combined with clinical observations, patient history, and epidemiological information. The expected result is Negative.  Fact Sheet for Patients:  EntrepreneurPulse.com.au  Fact Sheet for Healthcare Providers:  IncredibleEmployment.be  This test is no t yet approved or cleared by the [place] FDA and  has been authorized for detection and/or diagnosis of SARS-CoV-2 by FDA under an Emergency Use Authorization (EUA). This EUA will remain  in effect (meaning this test can be used) for the duration of the COVID-19 declaration under Section 564(b)(1) of the Act, 21 U.S.C.section 360bbb-3(b)(1), unless the authorization is terminated  or revoked sooner.       Influenza A by PCR NEGATIVE NEGATIVE Final   Influenza B by PCR NEGATIVE NEGATIVE Final    Comment: (NOTE) The Xpert Xpress SARS-CoV-2/FLU/RSV plus assay is intended as an aid in the diagnosis of influenza from Nasopharyngeal swab specimens and should not be used as a sole basis for treatment. Nasal washings and aspirates are unacceptable for Xpert Xpress SARS-CoV-2/FLU/RSV testing.  Fact Sheet for Patients: EntrepreneurPulse.com.au  Fact Sheet for Healthcare Providers: IncredibleEmployment.be  This  test is not yet approved or cleared by the [place] FDA and has been authorized for detection and/or diagnosis of SARS-CoV-2 by FDA under an Emergency Use Authorization (EUA). This EUA will remain in effect (meaning this test can be used) for the duration of the  COVID-19 declaration under Section 564(b)(1) of the Act, 21 U.S.C. section 360bbb-3(b)(1), unless the authorization is terminated or revoked.  Performed at [facility], [place] [address], [place], [place] [address]     Radiology Studies: No results found.    Quintez Maselli T. [place]  If 7PM-7AM, please contact night-coverage [email or web address] 05/23/2020, 12:12 PM

## 2020-05-24 DIAGNOSIS — E519 Thiamine deficiency, unspecified: Secondary | ICD-10-CM | POA: Diagnosis not present

## 2020-05-24 DIAGNOSIS — G9341 Metabolic encephalopathy: Secondary | ICD-10-CM | POA: Diagnosis not present

## 2020-05-24 DIAGNOSIS — R270 Ataxia, unspecified: Secondary | ICD-10-CM | POA: Diagnosis not present

## 2020-05-24 DIAGNOSIS — R531 Weakness: Secondary | ICD-10-CM | POA: Diagnosis not present

## 2020-05-24 LAB — CBC
HCT: 40.3 % (ref 39.0–52.0)
Hemoglobin: 13.3 g/dL (ref 13.0–17.0)
MCH: 30.1 pg (ref 26.0–34.0)
MCHC: 33 g/dL (ref 30.0–36.0)
MCV: 91.2 fL (ref 80.0–100.0)
Platelets: 269 10*3/uL (ref 150–400)
RBC: 4.42 MIL/uL (ref 4.22–5.81)
RDW: 12.7 % (ref 11.5–15.5)
WBC: 7.4 10*3/uL (ref 4.0–10.5)
nRBC: 0 % (ref 0.0–0.2)

## 2020-05-24 LAB — RENAL FUNCTION PANEL
Albumin: 3.3 g/dL — ABNORMAL LOW (ref 3.5–5.0)
Anion gap: 12 (ref 5–15)
BUN: 17 mg/dL (ref 8–23)
CO2: 27 mmol/L (ref 22–32)
Calcium: 9.5 mg/dL (ref 8.9–10.3)
Chloride: 99 mmol/L (ref 98–111)
Creatinine, Ser: 0.81 mg/dL (ref 0.61–1.24)
GFR, Estimated: >60 mL/min (ref >60)
Glucose, Bld: 106 mg/dL — ABNORMAL HIGH (ref 70–99)
Phosphorus: 2.5 mg/dL (ref 2.5–4.6)
Potassium: 4 mmol/L (ref 3.5–5.1)
Sodium: 138 mmol/L (ref 135–145)

## 2020-05-24 LAB — RESP PANEL BY RT-PCR (FLU A&B, COVID) ARPGX2
Influenza A by PCR: NEGATIVE
Influenza B by PCR: NEGATIVE
SARS Coronavirus 2 by RT PCR: NEGATIVE

## 2020-05-24 LAB — CK: Total CK: 36 U/L — ABNORMAL LOW (ref 49–397)

## 2020-05-24 LAB — MAGNESIUM: Magnesium: 2.2 mg/dL (ref 1.7–2.4)

## 2020-05-24 MED ORDER — ALUM & MAG HYDROXIDE-SIMETH 200-200-20 MG/5ML PO SUSP
30.0000 mL | ORAL | Status: DC | PRN
  Administered 2020-05-24: 30 mL via ORAL
  Filled 2020-05-24 (×2): qty 30

## 2020-05-24 MED ORDER — PANTOPRAZOLE SODIUM 40 MG PO TBEC
40.0000 mg | DELAYED_RELEASE_TABLET | Freq: Every day | ORAL | Status: DC
  Administered 2020-05-24 – 2020-05-25 (×2): 40 mg via ORAL
  Filled 2020-05-24 (×2): qty 1

## 2020-05-24 NOTE — Progress Notes (Signed)
Physical Therapy Treatment Patient Details Name: Jose [name] MRN: [id number] DOB: 07-03-1935 Today's Date: 05/24/2020    History of Present Illness Jose [name] is an 85 yo male with PMH: Chronic sensorimotor axonal polyneuropathy, ongoing alcohol abuse, thiamine deficiency, arthritis, HLD, IBS, sleep apnea, spinal stenosis, chronic fatigue who presented to the ER with complaints of ongoing ataxia.    PT Comments    Pt progressing with mobility. However he continues to require mod assist with bed mobility and transfers, needs min assist with amb to prevent falls. He is unsafe to d/c home and would benefit from SNF stay to prevent falls, maximize independence. Will continue to follow.   uire SNF     Equipment Recommendations  Rolling walker with 5" wheels    Recommendations for Other Services       Precautions / Restrictions Precautions Precautions: Fall Restrictions Weight Bearing Restrictions: No    Mobility  Bed Mobility Overal bed mobility: Needs Assistance Bed Mobility: Supine to Sit     Supine to sit: Mod assist;HOB elevated     General bed mobility comments: assist needed to come to EOB, repeated posterior LOB with attempt to sit EOB.  multi-modal cues for seqeuncing and  pt to self assist, positioning, difficulty maintaining midline    Transfers Overall transfer level: Needs assistance Equipment used: Rolling walker (2 wheeled) Transfers: Sit to/from Stand Sit to Stand: Mod assist         General transfer comment: assist to rise and transition to RW, multi-modal cues for use of UEs, trunk and hip extension.  Ambulation/Gait Ambulation/Gait assistance: Min assist Gait Distance (Feet): 85 Feet Assistive device: Rolling walker (2 wheeled) Gait Pattern/deviations: Wide base of support;Ataxic;Decreased stride length;Trunk flexed Gait velocity: decreased,   General Gait Details: multi-modal cues for trunk and hip extension, RW position from self,  to keep both feet inside RW. assist to balance and manuever RW   Stairs             Wheelchair Mobility    Modified Rankin (Stroke Patients Only)       Balance   Sitting-balance support: Bilateral upper extremity supported;Feet supported Sitting balance-Leahy Scale: Poor Sitting balance - Comments: posterior bias   Standing balance support: During functional activity;Bilateral upper extremity supported Standing balance-Leahy Scale: Poor Standing balance comment: wide BOS, use of UEs and external assist required                            Cognition Arousal/Alertness: Awake/alert Behavior During Therapy: WFL for tasks assessed/performed                       Current Attention Level: Sustained   Following Commands: Follows one step commands with increased time;Follows multi-step commands inconsistently Safety/Judgement: Decreased awareness of deficits;Decreased awareness of safety            Exercises General Exercises - Lower Extremity Ankle Circles/Pumps: AROM;Both;10 reps Quad Sets: AROM;Both;10 reps Gluteal Sets: AROM;Both;5 reps    General Comments        Pertinent Vitals/Pain Pain Assessment: No/denies pain    Home Living                      Prior Function            PT Goals (current goals can now be found in the care plan section) Acute Rehab PT Goals Patient Stated Goal: "I need relief" PT  Goal Formulation: With patient Time For Goal Achievement: 06/01/20 Potential to Achieve Goals: Fair Progress towards PT goals: Progressing toward goals    Frequency    Min 2X/week      PT Plan Current plan remains appropriate    Co-evaluation              AM-PAC PT "6 Clicks" Mobility   Outcome Measure  Help needed turning from your back to your side while in a flat bed without using bedrails?: A Lot Help needed moving from lying on your back to sitting on the side of a flat bed without using bedrails?: A  Lot Help needed moving to and from a bed to a chair (including a wheelchair)?: A Little Help needed standing up from a chair using your arms (e.g., wheelchair or bedside chair)?: A Little Help needed to walk in [name] room?: A Little Help needed climbing 3-5 steps with a railing? : A Little 6 Click Score: 16    End of Session Equipment Utilized During Treatment: Gait belt Activity Tolerance: Patient tolerated treatment well Patient left: in chair;with call bell/phone within reach;with chair alarm set Nurse Communication: Mobility status PT Visit Diagnosis: Unsteadiness on feet (R26.81);Difficulty in walking, not elsewhere classified (R26.2);Other symptoms and signs involving the nervous system (R29.898)     Time: 3762-8315 PT Time Calculation (min) (ACUTE ONLY): 25 min  Charges:  $Gait Training: 8-22 mins $Therapeutic Activity: 8-22 mins                     Jose [name], PT  Acute Rehab Dept (Jose [name]) [phone] Pager [phone]  05/24/2020    Jose [name] 05/24/2020, 2:30 PM

## 2020-05-24 NOTE — Care Management Important Message (Signed)
Important Message  Patient Details IM Letter given to the Patient Name: Jose [name] MRN: [id number] Date of Birth: February 12, 1935   Medicare Important Message Given:  Yes     Kerin Salen 05/24/2020, 10:45 AM

## 2020-05-24 NOTE — Progress Notes (Addendum)
PROGRESS NOTE  Jose [name] XYB:[id number] DOB: 04-18-1935   PCP: Ralene Ok, MD  Patient is from: Home.  Lives alone.  DOA: 05/17/2020 LOS: 5  Chief complaints: Ataxia  Brief Narrative / Interim history: 85 year old male history of alcohol abuse, thiamine deficiency, arthritis, OSA, spinal stenosis, chronic fatigue syndrome, IBS and HLD presented to ED with complaints of ataxia, and admitted for further evaluation and treatment.  He was seen by neurology for this problem. His vitamin B1 level was low at 7 nmol/L on 02/27/2020. NCV and EMG on 04/14/2020 concerning for chronic sensorimotor axonal polyneuropathy affecting the lower extremities.    Patient was started on high-dose IV thiamine and CIWA protocol with as needed Ativan and admitted for ataxia and possible alcohol withdrawal.  Patient continues to have confusion that seems to be waxing and waning.  He is also physically deconditioned.  Therapy recommended SNF.  TOC working on this.  Subjective: Seen and examined earlier this morning.  No major events overnight of this morning.  A lot of complaints including headache, dry cough and epigastric abdominal pain. He says cough is not new.  Denies chest pain, shortness of breath, nausea or vomiting.  Reports regular bowel movements.  Denies UTI symptoms.  Objective: Vitals:   05/23/20 0519 05/23/20 1344 05/23/20 2033 05/24/20 0535  BP: 100/62 114/74 113/76 131/83  Pulse: 77 83 76 88  Resp: 20 16 18 19   Temp: (!) 97.3 F (36.3 C) (!) 97.3 F (36.3 C) 97.6 F (36.4 C) 97.9 F (36.6 C)  TempSrc: Oral Oral Oral Oral  SpO2: 95% 95% 94% 94%    Intake/Output Summary (Last 24 hours) at 05/24/2020 1411 Last data filed at 05/24/2020 1250 Gross per 24 hour  Intake 831 ml  Output --  Net 831 ml   There were no vitals filed for this visit.  Examination:  GENERAL: No apparent distress.  Nontoxic. HEENT: MMM.  Vision and hearing grossly intact.  NECK: Supple.  No apparent JVD.   RESP: On RA.  No IWOB.  Fair aeration bilaterally. CVS:  RRR. Heart sounds normal.  ABD/GI/GU: BS+. Abd soft, NTND.  MSK/EXT:  Moves extremities. No apparent deformity. No edema.  SKIN: no apparent skin lesion or wound NEURO: Awake, alert and oriented appropriately.  No apparent focal neuro deficit. PSYCH: Calm. Normal affect.  Procedures:  None  Microbiology summarized: COVID-19 and influenza PCR nonreactive.  Assessment & Plan: Acute metabolic encephalopathy-reported history of ataxia and vitamin B1 deficiency raises concern for Warnicke encephalopathy.  Mental status improved although seems to be waxing and waning. -MRI brain without acute finding but chronic microvascular ischemic changes and parenchymal volume loss -Thiamine level placed on 7 nmol/L on 02/27/2020.  B12 and folic acid within normal. -Completing Wernicke dose IV thiamine, now on 100 mg daily. -Continue home Xanax at bedtime. -Reorientation and delirium precautions  Chronic sensorimotor axonal polyneuropathy/physical deconditioning/unsteady gait -EMG on 04/14/2020 at neurology office showed chronic sensorimotor axonal polyneuropathy affecting LE's -Therapy recommended SNF.  Patient reluctantly agrees to this.  TOC working on this.  Alcohol abuse/possible withdrawal: No significant withdrawal symptoms.  -Continue thiamine as above  Thiamine deficiency -Management as above  Epigastric abdominal pain-likely GERD/gastritis from NSAID -Start p.o. Protonix -Continue GI cocktail as needed  Dry cough-not new per patient.  No respiratory distress.  Could be related to GERD. -PPI and GI cocktail as above  Chronic fatigue syndrome -Resume home Ritalin  Hyponatremia: Could be due to alcohol.  Resolved.  Hypokalemia: Resolved.  Leukopenia:  Resolved.  Neck pain/cervical DDD-CT cervical spine in 2020 with healing C7 vertebral body fracture and increased finding of the interspinous distance at C6-7 and C7-T10 and  multilevel cervical spondylolisthesis.  Neuro exam within normal except for somewhat exaggerated patellar reflex.  X-ray with significant degenerative disease.  Pain improved. -Scheduled Tylenol, Flexeril and IV Toradol -K pad   There is no height or weight on file to calculate BMI.         DVT prophylaxis:  enoxaparin (LOVENOX) injection 40 mg Start: 05/18/20 1000  Code Status: Full code Family Communication: Updated patient's significant other over the phone Level of care: Med-Surg Status is: Inpatient  Remains inpatient appropriate because:Unsafe d/c plan   Dispo: The patient is from: Home              Anticipated d/c is to: SNF              Patient currently is medically stable to d/c.   Difficult to place patient No           Consultants:  None   Sch Meds:  Scheduled Meds: . acetaminophen  500 mg Oral Q8H  . ALPRAZolam  0.5 mg Oral QHS  . cyclobenzaprine  5 mg Oral TID  . enoxaparin (LOVENOX) injection  40 mg Subcutaneous Q24H  . folic acid  1 mg Oral Daily  . melatonin  5 mg Oral QHS  . multivitamin with minerals  1 tablet Oral Daily  . pantoprazole  40 mg Oral Daily  . sodium chloride flush  3 mL Intravenous Q12H  . thiamine injection  100 mg Intravenous Daily   Followed by  . [START ON 05/27/2020] thiamine  250 mg Oral Daily   Continuous Infusions:  PRN Meds:.alum & mag hydroxide-simeth, ketorolac, labetalol  Antimicrobials: Anti-infectives (From admission, onward)   None       I have personally reviewed the following labs and images: CBC: Recent Labs  Lab 05/18/20 0441 05/18/20 0534 05/19/20 0500 05/20/20 0527 05/24/20 0534  WBC 2.7* 2.8* 6.8 6.1 7.4  NEUTROABS 1.5* 1.3*  --   --   --   HGB 13.5 13.1 14.1 14.3 13.3  HCT 39.9 39.1 41.6 42.0 40.3  MCV 92.4 91.4 90.2 90.7 91.2  PLT 157 153 171 187 269   BMP &GFR Recent Labs  Lab 05/18/20 0441 05/18/20 0534 05/19/20 0500 05/20/20 0527 05/24/20 0534  NA 136 136 133* 136 138   K 3.3* 3.5 3.4* 4.5 4.0  CL 107 107 100 104 99  CO2 24 24 22 23 27   GLUCOSE 160* 132* 132* 110* 106*  BUN 14 15 10 14 17   CREATININE 0.78 0.71 0.76 0.92 0.81  CALCIUM 8.4* 8.7* 9.3 9.4 9.5  MG 2.1  --  1.8 2.0 2.2  PHOS  --   --   --  3.4 2.5   CrCl cannot be calculated (Unknown ideal weight.). Liver & Pancreas: Recent Labs  Lab 05/17/20 2000 05/20/20 0527 05/24/20 0534  AST 30  --   --   ALT 18  --   --   ALKPHOS 54  --   --   BILITOT 0.9  --   --   PROT 6.8  --   --   ALBUMIN 3.7 3.6 3.3*   No results for input(s): LIPASE, AMYLASE in the last 168 hours. No results for input(s): AMMONIA in the last 168 hours. Diabetic: No results for input(s): HGBA1C in the last 72 hours. No results for input(s):  GLUCAP in the last 168 hours. Cardiac Enzymes: Recent Labs  Lab 05/20/20 0527 05/24/20 0534  CKTOTAL 99 36*   No results for input(s): PROBNP in the last 8760 hours. Coagulation Profile: No results for input(s): INR, PROTIME in the last 168 hours. Thyroid Function Tests: No results for input(s): TSH, T4TOTAL, FREET4, T3FREE, THYROIDAB in the last 72 hours. Lipid Profile: No results for input(s): CHOL, HDL, LDLCALC, TRIG, CHOLHDL, LDLDIRECT in the last 72 hours. Anemia Panel: No results for input(s): VITAMINB12, FOLATE, FERRITIN, TIBC, IRON, RETICCTPCT in the last 72 hours. Urine analysis:    Component Value Date/Time   COLORURINE YELLOW 05/17/2020 2027   APPEARANCEUR CLEAR 05/17/2020 2027   LABSPEC 1.018 05/17/2020 2027   PHURINE 8.0 05/17/2020 2027   GLUCOSEU NEGATIVE 05/17/2020 2027   HGBUR NEGATIVE 05/17/2020 2027   BILIRUBINUR NEGATIVE 05/17/2020 2027   KETONESUR 80 (A) 05/17/2020 2027   PROTEINUR NEGATIVE 05/17/2020 2027   NITRITE NEGATIVE 05/17/2020 2027   LEUKOCYTESUR NEGATIVE 05/17/2020 2027   Sepsis Labs: Invalid input(s): PROCALCITONIN, [place]  Microbiology: Recent Results (from the past 240 hour(s))  Blood culture (routine x 2)     Status:  None   Collection Time: 05/17/20  8:29 PM   Specimen: BLOOD RIGHT ARM  Result Value Ref Range Status   Specimen Description   Final    BLOOD RIGHT ARM Performed at [facility], [place] [address] [place], [place] [address]    Special Requests   Final    BOTTLES DRAWN AEROBIC ONLY Blood Culture results may not be optimal due to an inadequate volume of blood received in culture bottles Performed at [place] [address], [place], [place] [address]    Culture   Final    NO GROWTH 5 DAYS Performed at [facility], [place] [address] [place], [place] [address]    Report Status 05/22/2020 FINAL  Final  Blood culture (routine x 2)     Status: Abnormal (Preliminary result)   Collection Time: 05/17/20  8:34 PM   Specimen: BLOOD LEFT ARM  Result Value Ref Range Status   Specimen Description   Final    BLOOD LEFT ARM Performed at [place] [address] [place], [place] [address]    Special Requests   Final    BOTTLES DRAWN AEROBIC AND ANAEROBIC Blood Culture adequate volume Performed at [place] [address] [place], [place] [address]    Culture  Setup Time   Final    ANAEROBIC BOTTLE ONLY GRAM POSITIVE COCCI CRITICAL RESULT CALLED TO, READ BACK BY AND VERIFIED WITH: L POINDEXTER PHARMD 05/19/20 0035 JDW GRAM POSITIVE RODS AEROBIC BOTTLE ONLY CRITICAL RESULT CALLED TO, READ BACK BY AND VERIFIED WITH: L CURRAN PHARMD 1809 05/19/20 A BROWNING    Culture (A)  Final    STAPHYLOCOCCUS HOMINIS THE SIGNIFICANCE OF ISOLATING THIS ORGANISM FROM A SINGLE SET OF BLOOD CULTURES WHEN MULTIPLE SETS ARE DRAWN IS UNCERTAIN. PLEASE NOTIFY THE MICROBIOLOGY DEPARTMENT WITHIN ONE WEEK IF SPECIATION AND SENSITIVITIES ARE REQUIRED. CULTURE REINCUBATED FOR BETTER GROWTH Performed at [facility], [place] [address], [place],  [address]    Report Status PENDING  Incomplete  Blood Culture ID Panel  (Reflexed)     Status: Abnormal   Collection Time: 05/17/20  8:34 PM  Result Value Ref Range Status   Enterococcus faecalis NOT DETECTED NOT DETECTED Final   Enterococcus Faecium NOT DETECTED NOT DETECTED Final   Listeria  monocytogenes NOT DETECTED NOT DETECTED Final   Staphylococcus species DETECTED (A) NOT DETECTED Final    Comment: CRITICAL RESULT CALLED TO, READ BACK BY AND VERIFIED WITH: L POINDEXTER PHARMD 05/19/20 0035 JDW    Staphylococcus aureus (BCID) NOT DETECTED NOT DETECTED Final   Staphylococcus epidermidis NOT DETECTED NOT DETECTED Final   Staphylococcus lugdunensis NOT DETECTED NOT DETECTED Final   Streptococcus species NOT DETECTED NOT DETECTED Final   Streptococcus agalactiae NOT DETECTED NOT DETECTED Final   Streptococcus pneumoniae NOT DETECTED NOT DETECTED Final   Streptococcus pyogenes NOT DETECTED NOT DETECTED Final   A.calcoaceticus-baumannii NOT DETECTED NOT DETECTED Final   Bacteroides fragilis NOT DETECTED NOT DETECTED Final   Enterobacterales NOT DETECTED NOT DETECTED Final   Enterobacter cloacae complex NOT DETECTED NOT DETECTED Final   Escherichia coli NOT DETECTED NOT DETECTED Final   Klebsiella aerogenes NOT DETECTED NOT DETECTED Final   Klebsiella oxytoca NOT DETECTED NOT DETECTED Final   Klebsiella pneumoniae NOT DETECTED NOT DETECTED Final   Proteus species NOT DETECTED NOT DETECTED Final   Salmonella species NOT DETECTED NOT DETECTED Final   Serratia marcescens NOT DETECTED NOT DETECTED Final   Haemophilus influenzae NOT DETECTED NOT DETECTED Final   Neisseria meningitidis NOT DETECTED NOT DETECTED Final   Pseudomonas aeruginosa NOT DETECTED NOT DETECTED Final   Stenotrophomonas maltophilia NOT DETECTED NOT DETECTED Final   Candida albicans NOT DETECTED NOT DETECTED Final   Candida auris NOT DETECTED NOT DETECTED Final   Candida glabrata NOT DETECTED NOT DETECTED Final   Candida krusei NOT DETECTED NOT DETECTED Final   Candida parapsilosis NOT  DETECTED NOT DETECTED Final   Candida tropicalis NOT DETECTED NOT DETECTED Final   Cryptococcus neoformans/gattii NOT DETECTED NOT DETECTED Final    Comment: Performed at [facility], [address] [place], [place] [address]  Resp Panel by RT-PCR (Flu A&B, Covid) Nasopharyngeal Swab     Status: None   Collection Time: 05/17/20  9:06 PM   Specimen: Nasopharyngeal Swab; Nasopharyngeal(NP) swabs in vial transport medium  Result Value Ref Range Status   SARS Coronavirus 2 by RT PCR NEGATIVE NEGATIVE Final    Comment: (NOTE) SARS-CoV-2 target nucleic acids are NOT DETECTED.  The SARS-CoV-2 RNA is generally detectable in upper respiratory specimens during the acute phase of infection. The lowest concentration of SARS-CoV-2 viral copies this assay can detect is 138 copies/mL. A negative result does not preclude SARS-Cov-2 infection and should not be used as the sole basis for treatment or other patient management decisions. A negative result may occur with  improper specimen collection/handling, submission of specimen other than nasopharyngeal swab, presence of viral mutation(s) within the areas targeted by this assay, and inadequate number of viral copies(<138 copies/mL). A negative result must be combined with clinical observations, patient history, and epidemiological information. The expected result is Negative.  Fact Sheet for Patients:  EntrepreneurPulse.com.au  Fact Sheet for Healthcare Providers:  IncredibleEmployment.be  This test is no t yet approved or cleared by the [place] FDA and  has been authorized for detection and/or diagnosis of SARS-CoV-2 by FDA under an Emergency Use Authorization (EUA). This EUA will remain  in effect (meaning this test can be used) for the duration of the COVID-19 declaration under Section 564(b)(1) of the Act, 21 U.S.C.section 360bbb-3(b)(1), unless the authorization is terminated  or revoked  sooner.       Influenza A by PCR NEGATIVE NEGATIVE Final   Influenza B by PCR NEGATIVE NEGATIVE Final  Comment: (NOTE) The Xpert Xpress SARS-CoV-2/FLU/RSV plus assay is intended as an aid in the diagnosis of influenza from Nasopharyngeal swab specimens and should not be used as a sole basis for treatment. Nasal washings and aspirates are unacceptable for Xpert Xpress SARS-CoV-2/FLU/RSV testing.  Fact Sheet for Patients: EntrepreneurPulse.com.au  Fact Sheet for Healthcare Providers: IncredibleEmployment.be  This test is not yet approved or cleared by the [place] FDA and has been authorized for detection and/or diagnosis of SARS-CoV-2 by FDA under an Emergency Use Authorization (EUA). This EUA will remain in effect (meaning this test can be used) for the duration of the COVID-19 declaration under Section 564(b)(1) of the Act, 21 U.S.C. section 360bbb-3(b)(1), unless the authorization is terminated or revoked.  Performed at [facility], [place] [address] [place], [place] [address]     Radiology Studies: No results found.    Jaydyn Bozzo T. [place]  If 7PM-7AM, please contact night-coverage [email or web address] 05/24/2020, 2:11 PM

## 2020-05-25 DIAGNOSIS — K219 Gastro-esophageal reflux disease without esophagitis: Secondary | ICD-10-CM

## 2020-05-25 DIAGNOSIS — G9341 Metabolic encephalopathy: Secondary | ICD-10-CM | POA: Diagnosis not present

## 2020-05-25 DIAGNOSIS — R799 Abnormal finding of blood chemistry, unspecified: Secondary | ICD-10-CM

## 2020-05-25 DIAGNOSIS — R270 Ataxia, unspecified: Secondary | ICD-10-CM | POA: Diagnosis not present

## 2020-05-25 DIAGNOSIS — R5381 Other malaise: Secondary | ICD-10-CM

## 2020-05-25 DIAGNOSIS — E519 Thiamine deficiency, unspecified: Secondary | ICD-10-CM | POA: Diagnosis not present

## 2020-05-25 MED ORDER — THIAMINE HCL 100 MG PO TABS: 100.0000 mg | ORAL_TABLET | Freq: Every day | ORAL | 1 refills | Status: DC

## 2020-05-25 MED ORDER — ADULT MULTIVITAMIN W/MINERALS CH: 1.0000 | ORAL_TABLET | Freq: Every day | ORAL | 1 refills | Status: DC

## 2020-05-25 MED ORDER — FOLIC ACID 1 MG PO TABS: 1.0000 mg | ORAL_TABLET | Freq: Every day | ORAL | Status: DC

## 2020-05-25 NOTE — Progress Notes (Signed)
PHARMACY - PHYSICIAN COMMUNICATION CRITICAL VALUE ALERT - BLOOD CULTURE IDENTIFICATION (BCID)  Jose [name] is an 85 y.o. male who presented to [facility] on 05/17/2020 with a chief complaint of ataxia, alcohol abuse, thiamine deficiency.  Assessment: initially pt had GPC in anaerobic bottle of 1 set (= staph hominis) and was reported to have gram positive rod in aerobic bottle of same set. however, now they said the gram positive rod is actually a gram negative rod, id'ed as SPHINGOMONAS PAUCIMOBILIS. so anaerobic bottle = staph hominis, aerobic bottle = SPHINGOMONAS PAUCIMOBILIS (in same set).   Name of physician (or Provider) Contacted: Drs. Cyndia Skeeters and [employer]  Current antibiotics: none  Changes to prescribed antibiotics recommended:  Considering contaminants, no antibiotics needed at this time.  Will repeat blood cultures today.  Results for orders placed or performed during the hospital encounter of 05/17/20  Blood Culture ID Panel (Reflexed) (Collected: 05/17/2020  8:34 PM)  Result Value Ref Range   Enterococcus faecalis NOT DETECTED NOT DETECTED   Enterococcus Faecium NOT DETECTED NOT DETECTED   Listeria monocytogenes NOT DETECTED NOT DETECTED   Staphylococcus species DETECTED (A) NOT DETECTED   Staphylococcus aureus (BCID) NOT DETECTED NOT DETECTED   Staphylococcus epidermidis NOT DETECTED NOT DETECTED   Staphylococcus lugdunensis NOT DETECTED NOT DETECTED   Streptococcus species NOT DETECTED NOT DETECTED   Streptococcus agalactiae NOT DETECTED NOT DETECTED   Streptococcus pneumoniae NOT DETECTED NOT DETECTED   Streptococcus pyogenes NOT DETECTED NOT DETECTED   A.calcoaceticus-baumannii NOT DETECTED NOT DETECTED   Bacteroides fragilis NOT DETECTED NOT DETECTED   Enterobacterales NOT DETECTED NOT DETECTED   Enterobacter cloacae complex NOT DETECTED NOT DETECTED   Escherichia coli NOT DETECTED NOT DETECTED   Klebsiella aerogenes NOT DETECTED NOT DETECTED   Klebsiella  oxytoca NOT DETECTED NOT DETECTED   Klebsiella pneumoniae NOT DETECTED NOT DETECTED   Proteus species NOT DETECTED NOT DETECTED   Salmonella species NOT DETECTED NOT DETECTED   Serratia marcescens NOT DETECTED NOT DETECTED   Haemophilus influenzae NOT DETECTED NOT DETECTED   Neisseria meningitidis NOT DETECTED NOT DETECTED   Pseudomonas aeruginosa NOT DETECTED NOT DETECTED   Stenotrophomonas maltophilia NOT DETECTED NOT DETECTED   Candida albicans NOT DETECTED NOT DETECTED   Candida auris NOT DETECTED NOT DETECTED   Candida glabrata NOT DETECTED NOT DETECTED   Candida krusei NOT DETECTED NOT DETECTED   Candida parapsilosis NOT DETECTED NOT DETECTED   Candida tropicalis NOT DETECTED NOT DETECTED   Cryptococcus neoformans/gattii NOT DETECTED NOT DETECTED    Peggyann Juba, PharmD, BCPS Pharmacy: [phone] 05/25/2020  10:00 AM

## 2020-05-25 NOTE — TOC Progression Note (Signed)
Transition of Care [facility]) - Progression Note    Patient Details  Name: Jose [name] MRN: [id number] Date of Birth: September 30, 1935  Transition of Care [facility]) CM/SW [place], Kaniyah Lisby [place] Phone Number: 05/25/2020, 11:14 AM  Clinical Narrative:  Patient who is stable for d/c will be returning home with SO today.  She will provide transportation home.  Cindie with [employer] confirmed that they will provide in-home services.  [employer] will deliver DME RW to patient's room.  No further needs identified.  TOC sign off.     Expected Discharge Plan: [place] Barriers to Discharge: Barriers Resolved  Expected Discharge Plan and Services Expected Discharge Plan: [place]   Discharge Planning Services: CM Consult Post Acute Care Choice: [place] Living arrangements for the past 2 months: Single Family Home Expected Discharge Date: 05/25/20                                     Social Determinants of Health (SDOH) Interventions    Readmission Risk Interventions No flowsheet data found.

## 2020-05-25 NOTE — Discharge Summary (Signed)
Physician Discharge Summary  Jose [name] PJK:[id number] DOB: May 03, 1935 DOA: 05/17/2020  PCP: Jilda Panda, MD  Admit date: 05/17/2020 Discharge date: 05/25/2020  Admitted From: Home Disposition: Home  Recommendations for Outpatient Follow-up:  1. Follow ups as below. 2. Please obtain CBC/BMP/Mag at follow up 3. Please follow up on the following pending results: Blood culture and thiamine level  Home Health: PT/OT/Aide Equipment/Devices: Rolling walker  Discharge Condition: Stable CODE STATUS: Full code   Follow-up Information    Jilda Panda, MD. Schedule an appointment as soon as possible for a visit in 1 week(s).   Specialty: Internal Medicine Contact information: [phone] [place] [phone]        Care, [facility] Follow up.   Specialty: Home Health Services Why: These are the folks that will be providing in home physical therapy. For in home aide help, which is an out of pocket expense, call Cindie at [phone]. She is also part of the [facility] system Contact information: [address] [place] [address] [phone]                Hospital Course: 85 year old male history of alcohol abuse, thiamine deficiency, arthritis, OSA, spinal stenosis, chronic fatigue syndrome, IBS and HLD presented to ED with complaints of ataxia, and admitted for further evaluation and treatment.  He was seen by neurology for this problem. His vitamin B1 level was low at 7 nmol/L on 02/27/2020. NCV and EMG on 04/14/2020 concerning for chronic sensorimotor axonal polyneuropathy affecting the lower extremities.    Patient was started on high-dose IV thiamine and CIWA protocol with as needed Ativan and admitted for ataxia and possible alcohol withdrawal.  Patient continues to have confusion that seems to be waxing and waning which eventually improved.Marland Kitchen  He is also physically deconditioned.  Therapy recommended SNF but patient and family  eventually opted for home with home health and DME.  Of note, patient had fever to 100.5 with leukopenia on presentation.  Blood cultures obtained at that time and reported as Staph hominis.  Since patient's leukopenia resolved and he remained afebrile throughout his stay, Staph hominis from single set of blood culture felt to be contaminant.  However, patient's blood cultures are updated to Sphingomonas Paucimobilis on the day of discharge.  This was discussed with infectious disease, Dr. Tommy Medal who felt this to be contaminant as well.  Repeat blood cultures obtained on the day of discharge.  Results will be communicated to patient and significant other if concerning.  See problem list below for more on hospital course.  Discharge Diagnoses:  Acute metabolic encephalopathy-reported history of ataxia and vitamin B1 deficiency raises concern for Warnicke encephalopathy.  Mental status improved although seems to be waxing and waning. -MRI brain without acute finding but chronic microvascular ischemic changes and parenchymal volume loss -Thiamine level placed on 7 nmol/L on 02/27/2020.  P37 and folic acid within normal. -Completed Wernicke dose IV thiamine.  Discharged on p.o. thiamine 100 mg daily -Outpatient follow-up with PCP and neurology  Chronic sensorimotor axonal polyneuropathy/physical deconditioning/unsteady gait -EMG on 04/14/2020 at neurology office showed chronic sensorimotor axonal polyneuropathy affecting LE's -Therapy recommended SNF but patient and family opted for home with home health  Alcohol abuse/possible withdrawal: No significant withdrawal symptoms.  -Continue thiamine as above -Strongly encouraged alcohol cessation  Thiamine deficiency -Management as above  Abnormal blood culture-initially reported as staph hominis and updated to Sphingomonas Paucimobilis.  Discussed with ID.  Felt to  be contaminant -Repeat blood culture obtained on the day of discharge.  Will contact  patient if concerning  Epigastric abdominal pain-likely GERD/gastritis from NSAID -Continue home PPI  Dry cough-No respiratory distress.  Could be related to GERD.  Improved with PPI. -Home PPI as above  Chronic fatigue syndrome -Continue home Ritalin -Outpatient follow-up with neurology  Hyponatremia: Could be due to alcohol.  Resolved.  Hypokalemia: Resolved.  Leukopenia: Resolved.  Neck pain/cervical DDD-CT cervical spine in 2020 with healing C7 vertebral body fracture and increased finding of the interspinous distance at C6-7 and C7-T10 and multilevel cervical spondylolisthesis.  Neuro exam within normal except for somewhat exaggerated patellar reflex.  X-ray with significant degenerative disease.  Pain improved. -Continue home medications -Consider outpatient follow-up with neurosurgery   There is no height or weight on file to calculate BMI.            Discharge Exam: Vitals:   05/24/20 2040 05/25/20 0559  BP: 128/84 115/67  Pulse: 86 81  Resp: 17 17  Temp: 98.8 F (37.1 C) 98.5 F (36.9 C)  SpO2: 94% 93%    GENERAL: No apparent distress.  Nontoxic. HEENT: MMM.  Vision and hearing grossly intact.  NECK: Supple.  No apparent JVD.  RESP: On RA.  No IWOB.  Fair aeration bilaterally. CVS:  RRR. Heart sounds normal.  ABD/GI/GU: Bowel sounds present. Soft. Non tender.  MSK/EXT:  Moves extremities. No apparent deformity. No edema.  SKIN: no apparent skin lesion or wound NEURO: Awake, alert and oriented appropriately.  No apparent focal neuro deficit. PSYCH: Calm. Normal affect.  Discharge Instructions  Discharge Instructions    Call MD for:  extreme fatigue   Complete by: As directed    Call MD for:  persistant dizziness or light-headedness   Complete by: As directed    Call MD for:  persistant nausea and vomiting   Complete by: As directed    Call MD for:  severe uncontrolled pain   Complete by: As directed    Call MD for:  temperature >100.4    Complete by: As directed    Diet general   Complete by: As directed    Discharge instructions   Complete by: As directed    It has been a pleasure taking care of you!  You were hospitalized due to altered mental status and unsteady gait.  We believe this is related to alcohol and thiamine deficiency and we have treated you with high-dose IV thiamine.  We recommend you continue taking your thiamine.  We also recommend you quit drinking alcohol.  Please review your new medication list and the directions on your medications before you take them.  Please follow-up with your primary care doctor in 1 to 2 weeks, and your neurologist in 2 to 3 weeks or sooner if needed.  Of note, your blood culture on admission grew odd bacteria in 1 out of 2 samples.  We believe this is contaminated sample of the actual infection.  We have obtained repeat blood culture today.  We will contact you if this blood culture is concerning.  Otherwise, you will not hear from [place].   Take care,   Increase activity slowly   Complete by: As directed      Allergies as of 05/25/2020   No Known Allergies     Medication List    STOP taking these medications   ibuprofen 600 MG tablet Commonly known as: ADVIL     TAKE these medications   ACIDOPHILUS  PO Take 1 capsule by mouth daily.   ALPRAZolam 0.5 MG tablet Commonly known as: XANAX Take 0.5 mg by mouth at bedtime as needed for sleep. Taken 1.5 daily   celecoxib 200 MG capsule Commonly known as: CELEBREX Take 200 mg by mouth daily.   famotidine 20 MG tablet Commonly known as: PEPCID Take 20 mg by mouth daily.   folic acid 1 MG tablet Commonly known as: FOLVITE Take 1 tablet (1 mg total) by mouth daily. Start taking on: May 26, 2020   hyoscyamine 0.375 MG 12 hr tablet Commonly known as: LEVBID Take 0.375 mg by mouth daily. I/2 tab occasionally   methylphenidate 5 MG tablet Commonly known as: RITALIN Take 5 mg by mouth daily as needed for anxiety. Takes  5-6  per day   multivitamin with minerals Tabs tablet Take 1 tablet by mouth daily. Start taking on: May 26, 2020   omeprazole 20 MG capsule Commonly known as: PRILOSEC Take 20 mg by mouth every morning.   thiamine 100 MG tablet Take 1 tablet (100 mg total) by mouth daily.       Consultations:  Infectious disease over the phone  Procedures/Studies:   DG Cervical Spine 2 or 3 views  Result Date: 05/21/2020 CLINICAL DATA:  Chronic neck pain after injury a year ago. EXAM: CERVICAL SPINE - 2-3 VIEW COMPARISON:  None. FINDINGS: There is no evidence of cervical spine fracture or prevertebral soft tissue swelling. Mild grade 1 retrolisthesis of C4-5 is noted secondary to severe degenerative disc disease at this level. Mild grade 1 anterolisthesis of C5-6 is noted. Severe degenerative disc disease is also noted at C5-6 and C6-7. IMPRESSION: Multilevel degenerative disc disease.  No acute abnormality seen. Electronically Signed   By: Marijo Conception M.D.   On: 05/21/2020 15:45   CT Head Wo Contrast  Result Date: 05/17/2020 CLINICAL DATA:  Chronic shoulder and knee pain with weakness. EXAM: CT HEAD WITHOUT CONTRAST TECHNIQUE: Contiguous axial images were obtained from the base of the skull through the vertex without intravenous contrast. COMPARISON:  MRI brain 03/19/2020.  CT head 08/29/2018 FINDINGS: Brain: No evidence of acute infarction, hemorrhage, hydrocephalus, extra-axial collection or mass lesion/mass effect. Diffuse cerebral atrophy. Ventricular dilatation consistent with central atrophy. Low-attenuation changes in the deep white matter consistent with small vessel ischemia. Vascular: Moderate intracranial arterial vascular calcifications. Skull: The calvarium appears intact. Sinuses/Orbits: Paranasal sinuses and mastoid air cells are clear. Other: None. IMPRESSION: 1. No acute intracranial abnormalities. 2. Chronic atrophy and small vessel ischemic changes. Electronically Signed   By:  Lucienne Capers M.D.   On: 05/17/2020 23:41   MR BRAIN WO CONTRAST  Result Date: 05/18/2020 CLINICAL DATA:  Ataxia, alcohol abuse EXAM: MRI HEAD WITHOUT CONTRAST TECHNIQUE: Multiplanar, multiecho pulse sequences of the brain and surrounding structures were obtained without intravenous contrast. COMPARISON:  03/19/2020 FINDINGS: Motion artifact is present. Brain: There is no acute infarction or intracranial hemorrhage. There is no intracranial mass, mass effect, or edema. There is no extra-axial fluid collection. Focus of susceptibility hypointensity is again identified along the lateral right precentral gyrus compatible with chronic microhemorrhage. Patchy T2 hyperintensity in the supratentorial white matter is nonspecific but may reflect mild chronic microvascular ischemic changes. Prominence of the ventricles and sulci reflects generalized parenchymal volume loss similar to the prior study. Relative prominence of the lateral and third ventricles favored to be secondary to central volume loss rather than hydrocephalus. Vascular: Major vessel flow voids at the skull base are preserved. Skull  and upper cervical spine: Normal marrow signal is preserved. Sinuses/Orbits: Paranasal sinuses are aerated. Bilateral lens replacements. Other: Sella is unremarkable.  Mastoid air cells are clear. IMPRESSION: Motion degraded study. No evidence of recent infarction, hemorrhage, or mass. Similar appearance of chronic microvascular ischemic changes and parenchymal volume loss. Electronically Signed   By: Macy Mis M.D.   On: 05/18/2020 08:47   DG Chest Portable 1 View  Result Date: 05/17/2020 CLINICAL DATA:  Increased generalized weakness over past 4 days. Today pt was unable to ambulate at home. Former smoker. weakness EXAM: PORTABLE CHEST 1 VIEW COMPARISON:  None. FINDINGS: Normal mediastinum and cardiac silhouette. Normal pulmonary vasculature. No evidence of effusion, infiltrate, or pneumothorax. No acute bony  abnormality. IMPRESSION: No acute cardiopulmonary process. Electronically Signed   By: Suzy Bouchard M.D.   On: 05/17/2020 20:57        The results of significant diagnostics from this hospitalization (including imaging, microbiology, ancillary and laboratory) are listed below for reference.     Microbiology: Recent Results (from the past 240 hour(s))  Blood culture (routine x 2)     Status: None   Collection Time: 05/17/20  8:29 PM   Specimen: BLOOD RIGHT ARM  Result Value Ref Range Status   Specimen Description   Final    BLOOD RIGHT ARM Performed at [facility], [place] [address], [place], [place] [address]    Special Requests   Final    BOTTLES DRAWN AEROBIC ONLY Blood Culture results may not be optimal due to an inadequate volume of blood received in culture bottles Performed at [place] [address], [place], [place] [address]    Culture   Final    NO GROWTH 5 DAYS Performed at [facility], [place] [address] [place], [place] [address]    Report Status 05/22/2020 FINAL  Final  Blood culture (routine x 2)     Status: Abnormal (Preliminary result)   Collection Time: 05/17/20  8:34 PM   Specimen: BLOOD LEFT ARM  Result Value Ref Range Status   Specimen Description   Final    BLOOD LEFT ARM Performed at [place] [address], [place], [place] [address]    Special Requests   Final    BOTTLES DRAWN AEROBIC AND ANAEROBIC Blood Culture adequate volume Performed at [facility] [address] [place], [place] [address]    Culture  Setup Time   Final    ANAEROBIC BOTTLE ONLY GRAM POSITIVE COCCI CRITICAL RESULT CALLED TO, READ BACK BY AND VERIFIED WITH: L POINDEXTER PHARMD 05/19/20 0035 JDW GRAM NEGATIVE RODS AEROBIC BOTTLE ONLY CRITICAL RESULT CALLED TO, READ BACK BY AND VERIFIED WITH: L CURRAN PHARMD 1809 05/19/20 A BROWNING CORRECTED RESULTS PREVIOUSLY REPORTED AS: GRAM POSITIVE  RODS CORRECTED RESULTS CALLED TO: J GADHIA PHARMD @0832  05/25/20 EB     Culture (A)  Final    STAPHYLOCOCCUS HOMINIS THE SIGNIFICANCE OF ISOLATING THIS ORGANISM FROM A SINGLE SET OF BLOOD CULTURES WHEN MULTIPLE SETS ARE DRAWN IS UNCERTAIN. PLEASE NOTIFY THE MICROBIOLOGY DEPARTMENT WITHIN ONE WEEK IF SPECIATION AND SENSITIVITIES ARE REQUIRED. SPHINGOMONAS PAUCIMOBILIS SUSCEPTIBILITIES TO FOLLOW Performed at [facility], [place] [address] [place], [place] [address]    Report Status PENDING  Incomplete  Blood Culture ID Panel (Reflexed)     Status: Abnormal   Collection Time: 05/17/20  8:34 PM  Result Value Ref Range Status   Enterococcus faecalis NOT DETECTED NOT DETECTED Final  Enterococcus Faecium NOT DETECTED NOT DETECTED Final   Listeria monocytogenes NOT DETECTED NOT DETECTED Final   Staphylococcus species DETECTED (A) NOT DETECTED Final    Comment: CRITICAL RESULT CALLED TO, READ BACK BY AND VERIFIED WITH: L POINDEXTER PHARMD 05/19/20 0035 JDW    Staphylococcus aureus (BCID) NOT DETECTED NOT DETECTED Final   Staphylococcus epidermidis NOT DETECTED NOT DETECTED Final   Staphylococcus lugdunensis NOT DETECTED NOT DETECTED Final   Streptococcus species NOT DETECTED NOT DETECTED Final   Streptococcus agalactiae NOT DETECTED NOT DETECTED Final   Streptococcus pneumoniae NOT DETECTED NOT DETECTED Final   Streptococcus pyogenes NOT DETECTED NOT DETECTED Final   A.calcoaceticus-baumannii NOT DETECTED NOT DETECTED Final   Bacteroides fragilis NOT DETECTED NOT DETECTED Final   Enterobacterales NOT DETECTED NOT DETECTED Final   Enterobacter cloacae complex NOT DETECTED NOT DETECTED Final   Escherichia coli NOT DETECTED NOT DETECTED Final   Klebsiella aerogenes NOT DETECTED NOT DETECTED Final   Klebsiella oxytoca NOT DETECTED NOT DETECTED Final   Klebsiella pneumoniae NOT DETECTED NOT DETECTED Final   Proteus species NOT DETECTED NOT DETECTED Final   Salmonella species NOT DETECTED  NOT DETECTED Final   Serratia marcescens NOT DETECTED NOT DETECTED Final   Haemophilus influenzae NOT DETECTED NOT DETECTED Final   Neisseria meningitidis NOT DETECTED NOT DETECTED Final   Pseudomonas aeruginosa NOT DETECTED NOT DETECTED Final   Stenotrophomonas maltophilia NOT DETECTED NOT DETECTED Final   Candida albicans NOT DETECTED NOT DETECTED Final   Candida auris NOT DETECTED NOT DETECTED Final   Candida glabrata NOT DETECTED NOT DETECTED Final   Candida krusei NOT DETECTED NOT DETECTED Final   Candida parapsilosis NOT DETECTED NOT DETECTED Final   Candida tropicalis NOT DETECTED NOT DETECTED Final   Cryptococcus neoformans/gattii NOT DETECTED NOT DETECTED Final    Comment: Performed at [facility], [address], [place], [place] [address]  Resp Panel by RT-PCR (Flu A&B, Covid) Nasopharyngeal Swab     Status: None   Collection Time: 05/17/20  9:06 PM   Specimen: Nasopharyngeal Swab; Nasopharyngeal(NP) swabs in vial transport medium  Result Value Ref Range Status   SARS Coronavirus 2 by RT PCR NEGATIVE NEGATIVE Final    Comment: (NOTE) SARS-CoV-2 target nucleic acids are NOT DETECTED.  The SARS-CoV-2 RNA is generally detectable in upper respiratory specimens during the acute phase of infection. The lowest concentration of SARS-CoV-2 viral copies this assay can detect is 138 copies/mL. A negative result does not preclude SARS-Cov-2 infection and should not be used as the sole basis for treatment or other patient management decisions. A negative result may occur with  improper specimen collection/handling, submission of specimen other than nasopharyngeal swab, presence of viral mutation(s) within the areas targeted by this assay, and inadequate number of viral copies(<138 copies/mL). A negative result must be combined with clinical observations, patient history, and epidemiological information. The expected result is Negative.  Fact Sheet for Patients:   EntrepreneurPulse.com.au  Fact Sheet for Healthcare Providers:  IncredibleEmployment.be  This test is no t yet approved or cleared by the [place] FDA and  has been authorized for detection and/or diagnosis of SARS-CoV-2 by FDA under an Emergency Use Authorization (EUA). This EUA will remain  in effect (meaning this test can be used) for the duration of the COVID-19 declaration under Section 564(b)(1) of the Act, 21 U.S.C.section 360bbb-3(b)(1), unless the authorization is terminated  or revoked sooner.       Influenza A by PCR NEGATIVE NEGATIVE Final  Influenza B by PCR NEGATIVE NEGATIVE Final    Comment: (NOTE) The Xpert Xpress SARS-CoV-2/FLU/RSV plus assay is intended as an aid in the diagnosis of influenza from Nasopharyngeal swab specimens and should not be used as a sole basis for treatment. Nasal washings and aspirates are unacceptable for Xpert Xpress SARS-CoV-2/FLU/RSV testing.  Fact Sheet for Patients: EntrepreneurPulse.com.au  Fact Sheet for Healthcare Providers: IncredibleEmployment.be  This test is not yet approved or cleared by the [place] FDA and has been authorized for detection and/or diagnosis of SARS-CoV-2 by FDA under an Emergency Use Authorization (EUA). This EUA will remain in effect (meaning this test can be used) for the duration of the COVID-19 declaration under Section 564(b)(1) of the Act, 21 U.S.C. section 360bbb-3(b)(1), unless the authorization is terminated or revoked.  Performed at [facility], [place] [address], [place], [place] [address]   Resp Panel by RT-PCR (Flu A&B, Covid) Nasopharyngeal Swab     Status: None   Collection Time: 05/24/20  1:31 PM   Specimen: Nasopharyngeal Swab; Nasopharyngeal(NP) swabs in vial transport medium  Result Value Ref Range Status   SARS Coronavirus 2 by RT PCR NEGATIVE NEGATIVE Final    Comment:  (NOTE) SARS-CoV-2 target nucleic acids are NOT DETECTED.  The SARS-CoV-2 RNA is generally detectable in upper respiratory specimens during the acute phase of infection. The lowest concentration of SARS-CoV-2 viral copies this assay can detect is 138 copies/mL. A negative result does not preclude SARS-Cov-2 infection and should not be used as the sole basis for treatment or other patient management decisions. A negative result may occur with  improper specimen collection/handling, submission of specimen other than nasopharyngeal swab, presence of viral mutation(s) within the areas targeted by this assay, and inadequate number of viral copies(<138 copies/mL). A negative result must be combined with clinical observations, patient history, and epidemiological information. The expected result is Negative.  Fact Sheet for Patients:  EntrepreneurPulse.com.au  Fact Sheet for Healthcare Providers:  IncredibleEmployment.be  This test is no t yet approved or cleared by the [place] FDA and  has been authorized for detection and/or diagnosis of SARS-CoV-2 by FDA under an Emergency Use Authorization (EUA). This EUA will remain  in effect (meaning this test can be used) for the duration of the COVID-19 declaration under Section 564(b)(1) of the Act, 21 U.S.C.section 360bbb-3(b)(1), unless the authorization is terminated  or revoked sooner.       Influenza A by PCR NEGATIVE NEGATIVE Final   Influenza B by PCR NEGATIVE NEGATIVE Final    Comment: (NOTE) The Xpert Xpress SARS-CoV-2/FLU/RSV plus assay is intended as an aid in the diagnosis of influenza from Nasopharyngeal swab specimens and should not be used as a sole basis for treatment. Nasal washings and aspirates are unacceptable for Xpert Xpress SARS-CoV-2/FLU/RSV testing.  Fact Sheet for Patients: EntrepreneurPulse.com.au  Fact Sheet for Healthcare  Providers: IncredibleEmployment.be  This test is not yet approved or cleared by the [place] FDA and has been authorized for detection and/or diagnosis of SARS-CoV-2 by FDA under an Emergency Use Authorization (EUA). This EUA will remain in effect (meaning this test can be used) for the duration of the COVID-19 declaration under Section 564(b)(1) of the Act, 21 U.S.C. section 360bbb-3(b)(1), unless the authorization is terminated or revoked.  Performed at [facility], [place] [address], [place], [place] [address]      Labs:  CBC: Recent Labs  Lab 05/19/20 0500 05/20/20 0527 05/24/20 0534  WBC 6.8 6.1 7.4  HGB 14.1 14.3 13.3  HCT 41.6 42.0 40.3  MCV 90.2 90.7 91.2  PLT 171 187 269   BMP &GFR Recent Labs  Lab 05/19/20 0500 05/20/20 0527 05/24/20 0534  NA 133* 136 138  K 3.4* 4.5 4.0  CL 100 104 99  CO2 22 23 27   GLUCOSE 132* 110* 106*  BUN 10 14 17   CREATININE 0.76 0.92 0.81  CALCIUM 9.3 9.4 9.5  MG 1.8 2.0 2.2  PHOS  --  3.4 2.5   CrCl cannot be calculated (Unknown ideal weight.). Liver & Pancreas: Recent Labs  Lab 05/20/20 0527 05/24/20 0534  ALBUMIN 3.6 3.3*   No results for input(s): LIPASE, AMYLASE in the last 168 hours. No results for input(s): AMMONIA in the last 168 hours. Diabetic: No results for input(s): HGBA1C in the last 72 hours. No results for input(s): GLUCAP in the last 168 hours. Cardiac Enzymes: Recent Labs  Lab 05/20/20 0527 05/24/20 0534  CKTOTAL 99 36*   No results for input(s): PROBNP in the last 8760 hours. Coagulation Profile: No results for input(s): INR, PROTIME in the last 168 hours. Thyroid Function Tests: No results for input(s): TSH, T4TOTAL, FREET4, T3FREE, THYROIDAB in the last 72 hours. Lipid Profile: No results for input(s): CHOL, HDL, LDLCALC, TRIG, CHOLHDL, LDLDIRECT in the last 72 hours. Anemia Panel: No results for input(s): VITAMINB12, FOLATE, FERRITIN, TIBC, IRON,  RETICCTPCT in the last 72 hours. Urine analysis:    Component Value Date/Time   COLORURINE YELLOW 05/17/2020 2027   APPEARANCEUR CLEAR 05/17/2020 2027   LABSPEC 1.018 05/17/2020 2027   PHURINE 8.0 05/17/2020 2027   GLUCOSEU NEGATIVE 05/17/2020 2027   HGBUR NEGATIVE 05/17/2020 2027   BILIRUBINUR NEGATIVE 05/17/2020 2027   KETONESUR 80 (A) 05/17/2020 2027   PROTEINUR NEGATIVE 05/17/2020 2027   NITRITE NEGATIVE 05/17/2020 2027   LEUKOCYTESUR NEGATIVE 05/17/2020 2027   Sepsis Labs: Invalid input(s): PROCALCITONIN, LACTICIDVEN   Time coordinating discharge: 40 minutes  SIGNED:  Mercy Riding, MD  [facility] 05/25/2020, 5:12 PM  If 7PM-7AM, please contact night-coverage [email or web address]

## 2020-05-26 LAB — CULTURE, BLOOD (ROUTINE X 2): Special Requests: ADEQUATE

## 2020-05-30 LAB — CULTURE, BLOOD (ROUTINE X 2)
Culture: NO GROWTH
Culture: NO GROWTH
Special Requests: ADEQUATE

## 2020-06-02 LAB — VITAMIN B1: Vitamin B1 (Thiamine): 164 nmol/L (ref 66.5–200.0)

## 2020-08-15 IMAGING — CT CT HEAD WITHOUT CONTRAST
4 of 7 series · 15 of 47 positions shown, 16 images · non-contrast
Comparison: None.

CLINICAL DATA: Minor head trauma, fall from treadmill, injury to
left ear with bleeding

EXAM:
CT HEAD WITHOUT CONTRAST
CT CERVICAL SPINE WITHOUT CONTRAST
TECHNIQUE: Multidetector CT imaging of the head and cervical spine was
performed following the standard protocol without intravenous
contrast. Multiplanar CT image reconstructions of the cervical spine
were also generated.

[Series 3: head wo · axial · 0.47mm/px · z∈[-114,-59]mm · 2 of 34 slices shown, 3 images]
[im 12/34  brain]
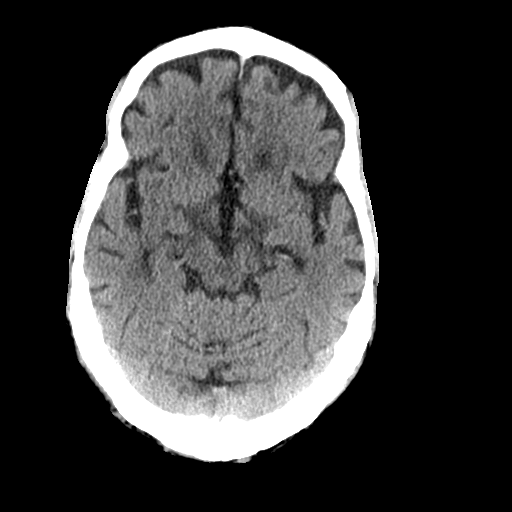
[im 12/34  bone]
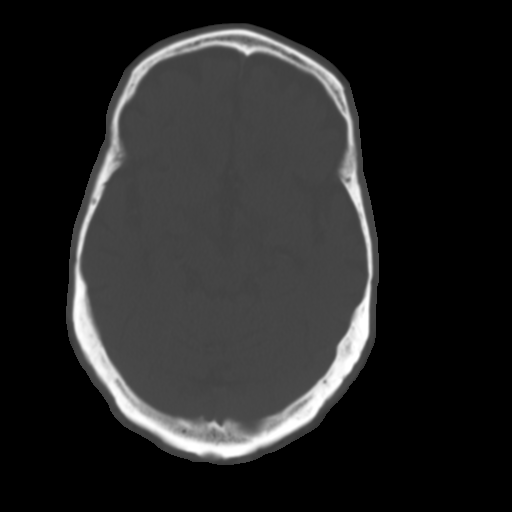
[im 23/34  brain]
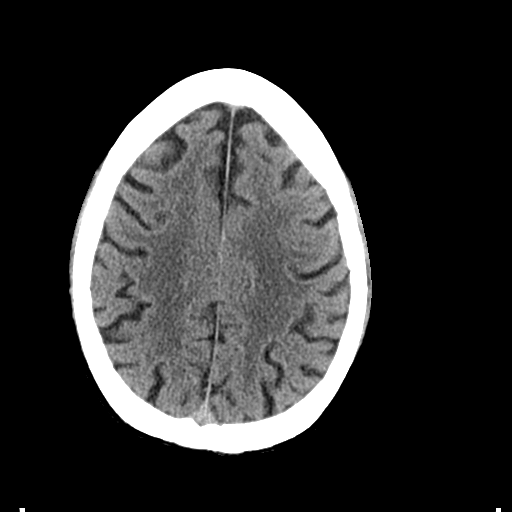

[Series 6: coronal soft tissue · coronal · 0.34mm/px · 3 of 75 slices shown]
[im 22/75  brain]
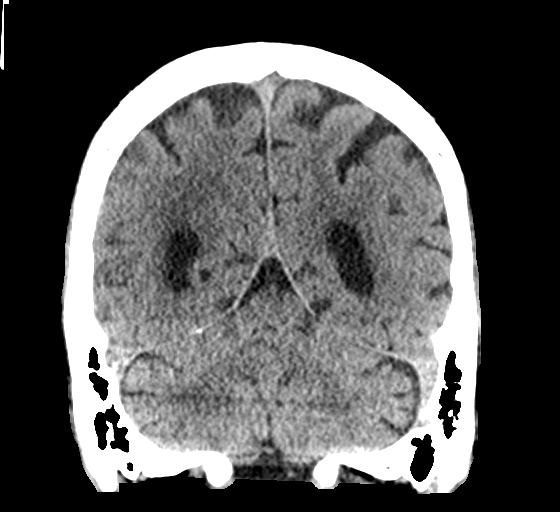
[im 32/75  brain]
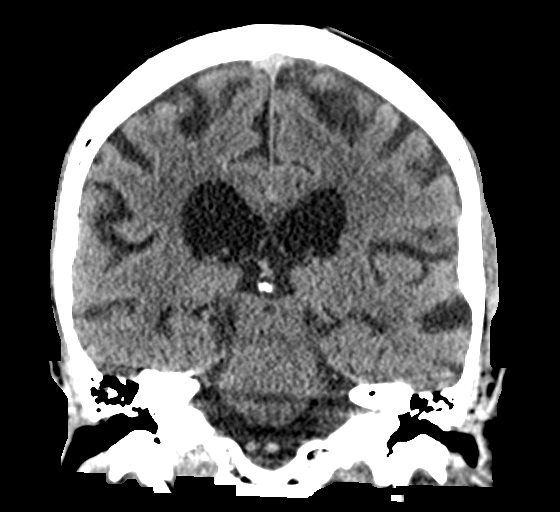
[im 43/75  brain]
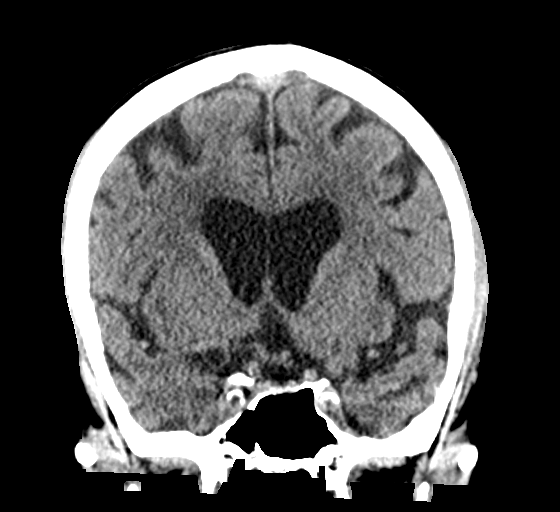

[Series 7: sagittal soft tissue · sagittal · 0.34mm/px · 2 of 61 slices shown]
[im 21/61  brain]
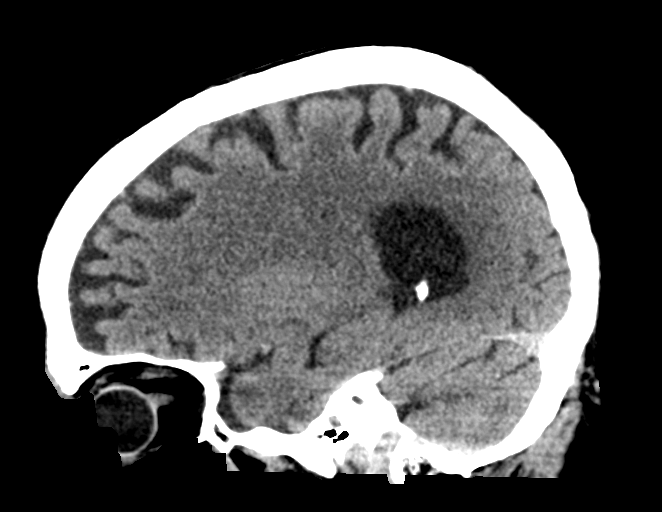
[im 41/61  brain]
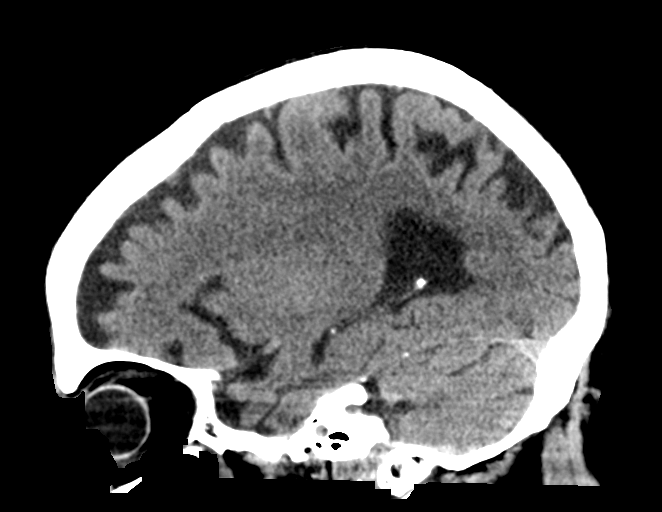

[Series 11: orthogonal bone · axial · 0.23mm/px · z∈[-341,-182]mm · 8 of 111 slices shown]
[im 8/111  bone]
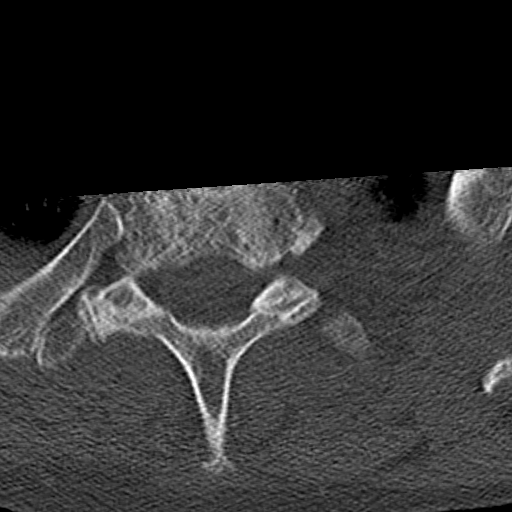
[im 24/111  bone]
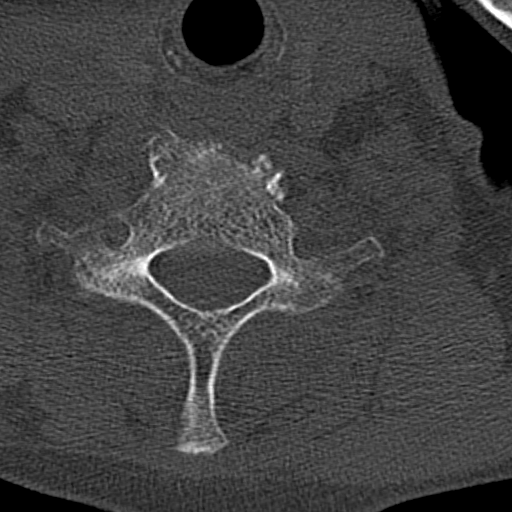
[im 40/111  bone]
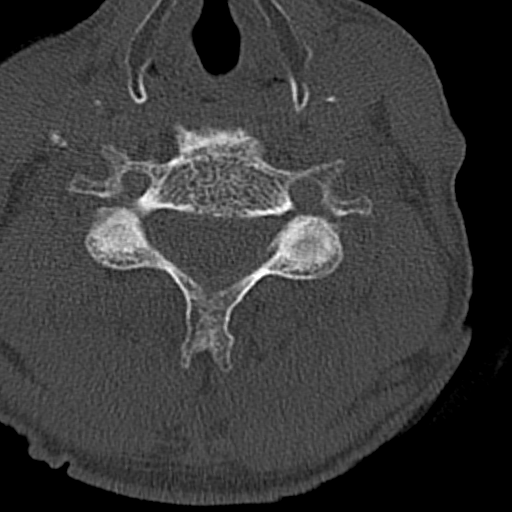
[im 48/111  bone]
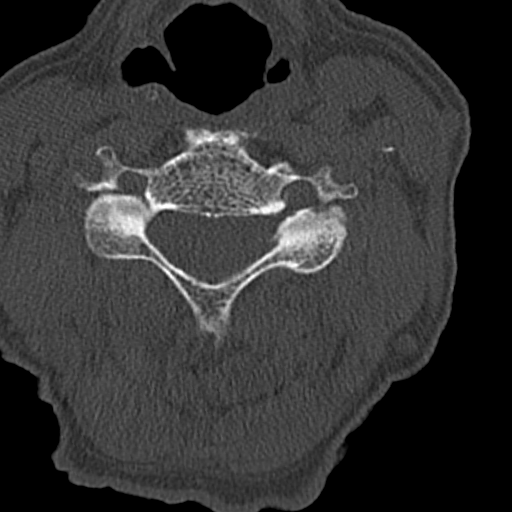
[im 63/111  bone]
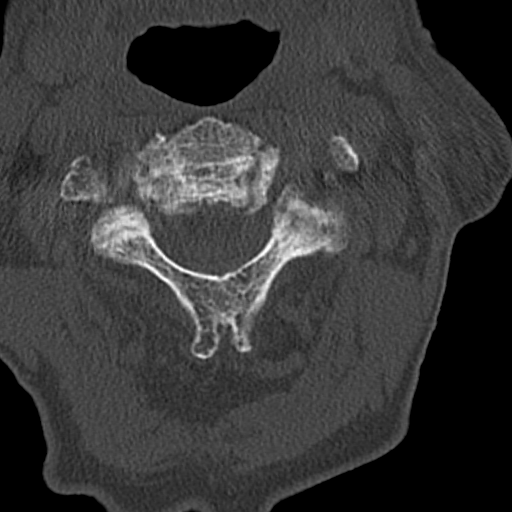
[im 71/111  bone]
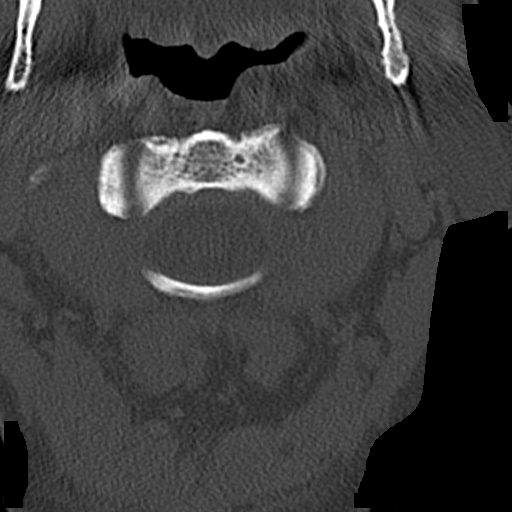
[im 87/111  bone]
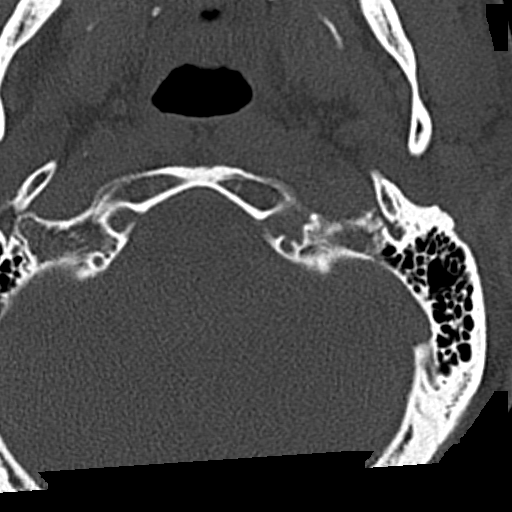
[im 103/111  bone]
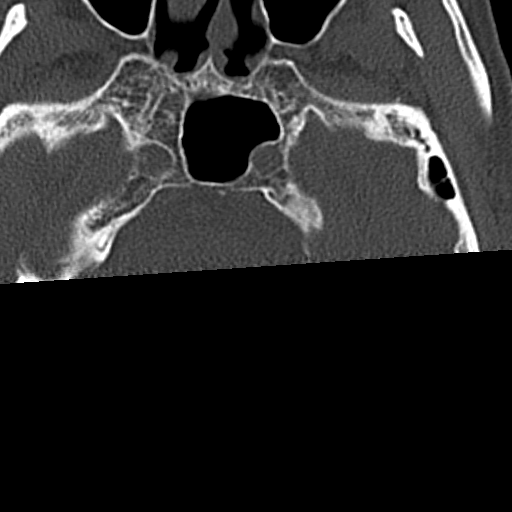

[15 of 47 positions shown; findings below may reference images not displayed]

FINDINGS: CT HEAD FINDINGS

Brain: No evidence of acute infarction, hemorrhage, hydrocephalus,
extra-axial collection or mass lesion/mass effect. Symmetric
prominence of the ventricles, cisterns and sulci compatible with
parenchymal volume loss. Patchy areas of white matter
hypoattenuation are most compatible with chronic microvascular
angiopathy.

Vascular: Atherosclerotic calcification of the carotid siphons. No
hyperdense vessel or unexpected calcification.

Skull: No calvarial fracture or suspicious osseous lesion. No scalp
swelling or hematoma.

Sinuses/Orbits: Paranasal sinuses and mastoid air cells are
predominantly clear. Orbital structures are unremarkable.

Other: Soft tissue laceration within the left retroauricular soft
tissues small amount of material is noted within the concha of the
left ear. Debris present within the external auditory canals.

CT CERVICAL SPINE FINDINGS

Alignment: Straightening of the normal cervical lordosis with focal
reversal at the C6-7 level on a degenerative basis.

Skull base and vertebrae: No acute fracture. No primary bone lesion
or focal pathologic process.

Soft tissues and spinal canal: Degenerative callus at the
atlantoaxial articulation with subchondral cystic changes within the
osseous structures. No pre or paravertebral fluid or swelling. No
visible canal hematoma.

Disc levels: Multilevel moderate cervical spondylitic changes
including complete disc height loss at C4-5, C5-6 and C6-7 where
posterior disc osteophyte complexes efface the ventral thecal sac
without significant spinal canal stenosis and hypertrophic facet
changes and uncinate spurring resulting in mild-to-moderate
foraminal narrowing, maximally at C4-5 and C5-6. No severe foraminal
stenosis

Upper chest: Pleuroparenchymal scarring in the apices. No acute
abnormality in the upper chest or imaged lung apices.

Other: Vascular calcium in the carotid bifurcations.
IMPRESSION: 1. No acute intracranial abnormality.
2. Soft tissue laceration within the left retroauricular soft
tissues with small amount of debris within the concha of the left
ear.
3. No acute cervical spine fracture.
4. Moderate cervical spondylitic changes, detailed above.

## 2020-11-16 ENCOUNTER — Other Ambulatory Visit: Payer: Self-pay

## 2020-11-16 ENCOUNTER — Encounter: Payer: Self-pay | Admitting: Internal Medicine

## 2020-11-16 ENCOUNTER — Ambulatory Visit (INDEPENDENT_AMBULATORY_CARE_PROVIDER_SITE_OTHER): Payer: Medicare Other | Admitting: Internal Medicine

## 2020-11-16 DIAGNOSIS — R270 Ataxia, unspecified: Secondary | ICD-10-CM

## 2020-11-16 DIAGNOSIS — G9332 Myalgic encephalomyelitis/chronic fatigue syndrome: Secondary | ICD-10-CM

## 2020-11-16 DIAGNOSIS — K219 Gastro-esophageal reflux disease without esophagitis: Secondary | ICD-10-CM

## 2020-11-16 DIAGNOSIS — M1990 Unspecified osteoarthritis, unspecified site: Secondary | ICD-10-CM | POA: Diagnosis not present

## 2020-11-16 NOTE — Progress Notes (Signed)
   Subjective:   Patient ID: Jose [name], male    DOB: 1935-03-16, 85 y.o.   MRN: [id number]  HPI The patient is a new 85 YO man coming in with several concerns and ongoing care of chronic issues.  PMH, [facility], social history reviewed and updated  Review of Systems  Constitutional:  Positive for fatigue.  HENT: Negative.    Eyes: Negative.   Respiratory:  Negative for cough, chest tightness and shortness of breath.   Cardiovascular:  Negative for chest pain, palpitations and leg swelling.  Gastrointestinal:  Negative for abdominal distention, abdominal pain, constipation, diarrhea, nausea and vomiting.  Musculoskeletal:  Positive for gait problem.  Skin: Negative.   Psychiatric/Behavioral: Negative.     Objective:  Physical Exam Constitutional:      Appearance: He is well-developed.  HENT:     Head: Normocephalic and atraumatic.  Cardiovascular:     Rate and Rhythm: Normal rate and regular rhythm.  Pulmonary:     Effort: Pulmonary effort is normal. No respiratory distress.     Breath sounds: Normal breath sounds. No wheezing or rales.  Abdominal:     General: Bowel sounds are normal. There is no distension.     Palpations: Abdomen is soft.     Tenderness: There is no abdominal tenderness. There is no rebound.  Musculoskeletal:     Cervical back: Normal range of motion.  Skin:    General: Skin is warm and dry.  Neurological:     Mental Status: He is alert and oriented to person, place, and time.     Coordination: Coordination normal.    Vitals:   11/16/20 1303  BP: 118/70  Pulse: 85  Resp: 18  SpO2: 95%  Weight: 156 lb 6.4 oz (70.9 kg)  Height: 5\' 10"  (1.778 m)    This visit occurred during the SARS-CoV-2 public health emergency.  Safety protocols were in place, including screening questions prior to the visit, additional usage of staff PPE, and extensive cleaning of exam room while observing appropriate contact time as indicated for disinfecting solutions.    Assessment & Plan:

## 2020-11-16 NOTE — Patient Instructions (Addendum)
Keep working on the exercise with your trainer.

## 2020-11-18 DIAGNOSIS — R2689 Other abnormalities of gait and mobility: Secondary | ICD-10-CM | POA: Insufficient documentation

## 2020-11-19 DIAGNOSIS — K219 Gastro-esophageal reflux disease without esophagitis: Secondary | ICD-10-CM

## 2020-11-19 DIAGNOSIS — M1990 Unspecified osteoarthritis, unspecified site: Secondary | ICD-10-CM | POA: Insufficient documentation

## 2020-11-19 DIAGNOSIS — G9332 Myalgic encephalomyelitis/chronic fatigue syndrome: Secondary | ICD-10-CM | POA: Insufficient documentation

## 2020-11-19 HISTORY — DX: Gastro-esophageal reflux disease without esophagitis: K21.9

## 2020-11-19 NOTE — Assessment & Plan Note (Signed)
Does take ritalin for this and has been taking this for his chronic fatigue since 30s. We have agreed to do this rx for him at current level of #200 per 30 days and will refill when due. Asked for follow up visit 3 months. [place] narcotic database reviewed and appropriate.

## 2020-11-19 NOTE — Assessment & Plan Note (Signed)
He was found to have neuropathy which we discussed but he does not feel this is impacting his walking. Discussed how long term alcohol overuse can contribute to progressive neuropathy.

## 2020-11-19 NOTE — Assessment & Plan Note (Signed)
Taking omeprazole and pepcid with decent control of symptoms.

## 2020-11-19 NOTE — Assessment & Plan Note (Signed)
Taking celebrex daily which seems to help symptoms. Reviewed labs from hospital stay without renal impairment or anemia. Will continue.

## 2021-01-04 DIAGNOSIS — M75100 Unspecified rotator cuff tear or rupture of unspecified shoulder, not specified as traumatic: Secondary | ICD-10-CM | POA: Insufficient documentation

## 2021-02-17 ENCOUNTER — Ambulatory Visit: Payer: Medicare Other | Admitting: Internal Medicine

## 2021-02-18 ENCOUNTER — Ambulatory Visit: Payer: Medicare Other | Admitting: Internal Medicine

## 2021-02-21 ENCOUNTER — Ambulatory Visit: Payer: Medicare Other | Admitting: Internal Medicine

## 2021-02-25 ENCOUNTER — Encounter: Payer: Self-pay | Admitting: Internal Medicine

## 2021-02-25 ENCOUNTER — Ambulatory Visit (INDEPENDENT_AMBULATORY_CARE_PROVIDER_SITE_OTHER): Payer: Medicare Other | Admitting: Internal Medicine

## 2021-02-25 ENCOUNTER — Other Ambulatory Visit: Payer: Self-pay

## 2021-02-25 DIAGNOSIS — G9332 Myalgic encephalomyelitis/chronic fatigue syndrome: Secondary | ICD-10-CM | POA: Diagnosis not present

## 2021-02-25 DIAGNOSIS — R21 Rash and other nonspecific skin eruption: Secondary | ICD-10-CM | POA: Diagnosis not present

## 2021-02-25 MED ORDER — CLOTRIMAZOLE 1 % EX CREA: 1.0000 "application " | TOPICAL_CREAM | Freq: Two times a day (BID) | CUTANEOUS | 3 refills | Status: DC

## 2021-02-25 NOTE — Progress Notes (Signed)
° °  Subjective:   Patient ID: Jose [name], male    DOB: 01-Aug-1935, 86 y.o.   MRN: [id number]  HPI The patient is an 86 YO man coming in for follow up and itching on the back.  Review of Systems  Constitutional: Negative.   HENT: Negative.    Eyes: Negative.   Respiratory:  Negative for cough, chest tightness and shortness of breath.   Cardiovascular:  Negative for chest pain, palpitations and leg swelling.  Gastrointestinal:  Negative for abdominal distention, abdominal pain, constipation, diarrhea, nausea and vomiting.  Musculoskeletal: Negative.   Skin:  Positive for rash.  Neurological: Negative.   Psychiatric/Behavioral: Negative.     Objective:  Physical Exam Constitutional:      Appearance: He is well-developed.  HENT:     Head: Normocephalic and atraumatic.  Cardiovascular:     Rate and Rhythm: Normal rate and regular rhythm.  Pulmonary:     Effort: Pulmonary effort is normal. No respiratory distress.     Breath sounds: Normal breath sounds. No wheezing or rales.  Abdominal:     General: Bowel sounds are normal. There is no distension.     Palpations: Abdomen is soft.     Tenderness: There is no abdominal tenderness. There is no rebound.  Musculoskeletal:     Cervical back: Normal range of motion.  Skin:    General: Skin is warm and dry.     Findings: Rash present.     Comments: Spotty rash on the back  Neurological:     Mental Status: He is alert and oriented to person, place, and time.     Coordination: Coordination normal.    Vitals:   02/25/21 1415  BP: 126/70  Pulse: (!) 46  Resp: 18  SpO2: 100%  Weight: 156 lb 6.4 oz (70.9 kg)  Height: 5\' 10"  (1.778 m)    This visit occurred during the SARS-CoV-2 public health emergency.  Safety protocols were in place, including screening questions prior to the visit, additional usage of staff PPE, and extensive cleaning of exam room while observing appropriate contact time as indicated for disinfecting  solutions.   Assessment & Plan:

## 2021-02-25 NOTE — Assessment & Plan Note (Signed)
Previously dermatology has prescribed triamcinolone which did not help. Will try antifungal clotrimazole and continue otc cortisone ointment.

## 2021-02-25 NOTE — Assessment & Plan Note (Signed)
Overall stable, still working out.

## 2021-02-25 NOTE — Patient Instructions (Addendum)
We have sent in the cream for the back to use twice a day clotrimazole.

## 2021-07-18 ENCOUNTER — Encounter: Payer: Self-pay | Admitting: Internal Medicine

## 2021-07-18 ENCOUNTER — Ambulatory Visit (INDEPENDENT_AMBULATORY_CARE_PROVIDER_SITE_OTHER): Payer: Medicare Other | Admitting: Internal Medicine

## 2021-07-18 VITALS — BP 124/70 | HR 107 | Resp 18 | Ht 70.0 in | Wt 156.0 lb

## 2021-07-18 DIAGNOSIS — E519 Thiamine deficiency, unspecified: Secondary | ICD-10-CM | POA: Diagnosis not present

## 2021-07-18 DIAGNOSIS — R270 Ataxia, unspecified: Secondary | ICD-10-CM

## 2021-07-18 DIAGNOSIS — Z13220 Encounter for screening for lipoid disorders: Secondary | ICD-10-CM

## 2021-07-18 DIAGNOSIS — M1990 Unspecified osteoarthritis, unspecified site: Secondary | ICD-10-CM

## 2021-07-18 DIAGNOSIS — E559 Vitamin D deficiency, unspecified: Secondary | ICD-10-CM

## 2021-07-18 DIAGNOSIS — G9332 Myalgic encephalomyelitis/chronic fatigue syndrome: Secondary | ICD-10-CM

## 2021-07-18 DIAGNOSIS — G4700 Insomnia, unspecified: Secondary | ICD-10-CM | POA: Insufficient documentation

## 2021-07-18 DIAGNOSIS — F5101 Primary insomnia: Secondary | ICD-10-CM

## 2021-07-18 LAB — T4, FREE: Free T4: 0.77 ng/dL (ref 0.60–1.60)

## 2021-07-18 LAB — CBC
HCT: 42.4 % (ref 39.0–52.0)
Hemoglobin: 14.1 g/dL (ref 13.0–17.0)
MCHC: 33.3 g/dL (ref 30.0–36.0)
MCV: 95.4 fl (ref 78.0–100.0)
Platelets: 209 10*3/uL (ref 150.0–400.0)
RBC: 4.44 Mil/uL (ref 4.22–5.81)
RDW: 15.3 % (ref 11.5–15.5)
WBC: 10 10*3/uL (ref 4.0–10.5)

## 2021-07-18 LAB — COMPREHENSIVE METABOLIC PANEL
ALT: 21 U/L (ref 0–53)
AST: 19 U/L (ref 0–37)
Albumin: 3.9 g/dL (ref 3.5–5.2)
Alkaline Phosphatase: 46 U/L (ref 39–117)
BUN: 25 mg/dL — ABNORMAL HIGH (ref 6–23)
CO2: 35 mEq/L — ABNORMAL HIGH (ref 19–32)
Calcium: 10.6 mg/dL — ABNORMAL HIGH (ref 8.4–10.5)
Chloride: 98 mEq/L (ref 96–112)
Creatinine, Ser: 1.05 mg/dL (ref 0.40–1.50)
GFR: 65.31 mL/min (ref >60.00)
Glucose, Bld: 98 mg/dL (ref 70–99)
Potassium: 4.1 mEq/L (ref 3.5–5.1)
Sodium: 141 mEq/L (ref 135–145)
Total Bilirubin: 0.5 mg/dL (ref 0.2–1.2)
Total Protein: 6.4 g/dL (ref 6.0–8.3)

## 2021-07-18 LAB — LIPID PANEL
Cholesterol: 259 mg/dL — ABNORMAL HIGH (ref 0–200)
HDL: 97.5 mg/dL (ref >39.00)
NonHDL: 161.24
Total CHOL/HDL Ratio: 3
Triglycerides: 203 mg/dL — ABNORMAL HIGH (ref 0.0–149.0)
VLDL: 40.6 mg/dL — ABNORMAL HIGH (ref 0.0–40.0)

## 2021-07-18 LAB — VITAMIN B12: Vitamin B-12: 477 pg/mL (ref 211–911)

## 2021-07-18 LAB — TSH: TSH: 4.3 u[IU]/mL (ref 0.35–5.50)

## 2021-07-18 LAB — FOLATE: Folate: 17.3 ng/mL (ref >5.9)

## 2021-07-18 LAB — VITAMIN D 25 HYDROXY (VIT D DEFICIENCY, FRACTURES): VITD: 82.28 ng/mL (ref 30.00–100.00)

## 2021-07-18 LAB — LDL CHOLESTEROL, DIRECT: Direct LDL: 122 mg/dL

## 2021-07-18 NOTE — Assessment & Plan Note (Addendum)
Still working out and taking methylphenidate 5-6 tablets per day. Okay with taking over prescription and filling for #6 per day. Checking UDS for appropriateness prior to prescribing and checked [place] narcotic database and signed contract for both methylphenidate and alprazolam per current dosing. Needs visit at least every 6 months.

## 2021-07-18 NOTE — Progress Notes (Signed)
   Subjective:   Patient ID: Jose [name], male    DOB: Mar 11, 1935, 86 y.o.   MRN: [id number]  HPI The patient is an 86 YO man coming in for follow up.   Review of Systems  Constitutional:  Positive for activity change and fatigue.  HENT: Negative.    Eyes: Negative.   Respiratory:  Negative for cough, chest tightness and shortness of breath.   Cardiovascular:  Negative for chest pain, palpitations and leg swelling.  Gastrointestinal:  Negative for abdominal distention, abdominal pain, constipation, diarrhea, nausea and vomiting.  Musculoskeletal:  Positive for arthralgias, gait problem and myalgias.  Skin: Negative.   Neurological:  Positive for weakness and numbness.  Psychiatric/Behavioral: Negative.      Objective:  Physical Exam Constitutional:      Appearance: He is well-developed.  HENT:     Head: Normocephalic and atraumatic.  Cardiovascular:     Rate and Rhythm: Normal rate and regular rhythm.  Pulmonary:     Effort: Pulmonary effort is normal. No respiratory distress.     Breath sounds: Normal breath sounds. No wheezing or rales.  Abdominal:     General: Bowel sounds are normal. There is no distension.     Palpations: Abdomen is soft.     Tenderness: There is no abdominal tenderness. There is no rebound.  Musculoskeletal:        General: Tenderness present.     Cervical back: Normal range of motion.  Skin:    General: Skin is warm and dry.  Neurological:     Mental Status: He is alert and oriented to person, place, and time.     Coordination: Coordination abnormal.     Comments: Slow to stand     Vitals:   07/18/21 1301  BP: 124/70  Pulse: (!) 107  Resp: 18  SpO2: 97%  Weight: 156 lb (70.8 kg)  Height: '5\' 10"'$  (1.778 m)    Assessment & Plan:

## 2021-07-18 NOTE — Assessment & Plan Note (Addendum)
Takes alprazolam 2-3 0.5 mg tablets at bedtime for sleep. Agree with continuing this and agree with taking over prescription. Controlled substance contract signed and [place] database reviewed and checking UDS today.

## 2021-07-22 LAB — VITAMIN B1: Vitamin B1 (Thiamine): 87 nmol/L — ABNORMAL HIGH (ref 8–30)

## 2021-07-22 NOTE — Assessment & Plan Note (Signed)
Taking celebrex 200 mg daily and checking CBC and CMP today to ensure no damage from medication.

## 2021-07-22 NOTE — Assessment & Plan Note (Signed)
Checking B12, B1, folate, vitamin D, TSH, CBC, CMP. Checking ABI to rule out arterial insufficiency causing weakness with walking.

## 2021-07-22 NOTE — Assessment & Plan Note (Signed)
Checking B1 today and adjust as needed. He is taking this daily.

## 2021-07-28 ENCOUNTER — Telehealth: Payer: Self-pay | Admitting: Internal Medicine

## 2021-07-28 NOTE — Telephone Encounter (Signed)
Labs placed he can get drawn.

## 2021-07-28 NOTE — Telephone Encounter (Signed)
Pt is requesting lab orders for his calcium per his previous lab result note stating "calcium is mildly high. We could recheck this with a direct measurement to find if this is truly high or lab error but he would need to return for labs".    Please advise.

## 2021-08-01 ENCOUNTER — Ambulatory Visit (HOSPITAL_COMMUNITY)
Admission: RE | Admit: 2021-08-01 | Discharge: 2021-08-01 | Disposition: A | Discharge status: Home / Self Care | Payer: Medicare Other | Source: Ambulatory Visit | Attending: Cardiology | Admitting: Cardiology

## 2021-08-01 DIAGNOSIS — R270 Ataxia, unspecified: Secondary | ICD-10-CM | POA: Diagnosis not present

## 2021-08-02 ENCOUNTER — Other Ambulatory Visit: Payer: Medicare Other

## 2021-08-04 NOTE — Telephone Encounter (Signed)
Spoke with the patient and he has been scheduled for his blood work

## 2021-08-05 ENCOUNTER — Other Ambulatory Visit: Payer: Medicare Other

## 2021-08-05 DIAGNOSIS — G9332 Myalgic encephalomyelitis/chronic fatigue syndrome: Secondary | ICD-10-CM

## 2021-08-12 LAB — PRESCRIBED DRUGS,MEDMATCH(R)

## 2021-08-12 LAB — PTH, INTACT AND CALCIUM
Calcium: 10.3 mg/dL (ref 8.6–10.3)
PTH: 26 pg/mL (ref 16–77)

## 2021-08-12 LAB — DM TEMPLATE

## 2021-08-22 ENCOUNTER — Encounter: Payer: Self-pay | Admitting: Internal Medicine

## 2021-08-22 ENCOUNTER — Ambulatory Visit (INDEPENDENT_AMBULATORY_CARE_PROVIDER_SITE_OTHER): Payer: Medicare Other | Admitting: Internal Medicine

## 2021-08-22 VITALS — BP 116/84 | HR 100 | Resp 18 | Ht 70.0 in | Wt 152.8 lb

## 2021-08-22 DIAGNOSIS — R270 Ataxia, unspecified: Secondary | ICD-10-CM

## 2021-08-22 DIAGNOSIS — R410 Disorientation, unspecified: Secondary | ICD-10-CM

## 2021-08-22 DIAGNOSIS — R531 Weakness: Secondary | ICD-10-CM | POA: Diagnosis not present

## 2021-08-22 DIAGNOSIS — F1010 Alcohol abuse, uncomplicated: Secondary | ICD-10-CM | POA: Diagnosis not present

## 2021-08-22 LAB — CBC
HCT: 41.2 % (ref 39.0–52.0)
Hemoglobin: 14 g/dL (ref 13.0–17.0)
MCHC: 33.9 g/dL (ref 30.0–36.0)
MCV: 94.8 fl (ref 78.0–100.0)
Platelets: 242 10*3/uL (ref 150.0–400.0)
RBC: 4.34 Mil/uL (ref 4.22–5.81)
RDW: 14.6 % (ref 11.5–15.5)
WBC: 6.8 10*3/uL (ref 4.0–10.5)

## 2021-08-22 NOTE — Progress Notes (Signed)
   Subjective:   Patient ID: Jose [name], male    DOB: [birth year], 86 y.o.   MRN: [id number]  HPI The patient is an 86 YO man coming in for weakness and confusion. Friend is with him and helps to provide history. She adds that he typically drinks a large amount of alcohol and has stopped drinking about 7-10 days ago. He got weakness which was severe and he appeared sick. He was staying in bed about 3 days ago and was confused. He stayed in bed that day and the confusion had cleared some the next day. Weakness is gradually improving but still bad. Overall in the last year he has had progressive weakness of legs and in general. He has had several "flares" of weakness and he and friend are unsure if this is related to times he was drinking less alcohol. Does not clarify exact amount of drinking but maybe 7-10 drinks per day.   Review of Systems  Constitutional:  Positive for activity change, appetite change, fatigue and unexpected weight change.  HENT: Negative.    Eyes: Negative.   Respiratory:  Negative for cough, chest tightness and shortness of breath.   Cardiovascular:  Negative for chest pain, palpitations and leg swelling.  Gastrointestinal:  Negative for abdominal distention, abdominal pain, constipation, diarrhea, nausea and vomiting.  Musculoskeletal:  Positive for arthralgias, back pain, gait problem and myalgias.  Skin: Negative.   Neurological:  Positive for weakness and numbness.  Psychiatric/Behavioral: Negative.      Objective:  Physical Exam Constitutional:      Appearance: He is well-developed. He is ill-appearing.  HENT:     Head: Normocephalic and atraumatic.  Cardiovascular:     Rate and Rhythm: Normal rate and regular rhythm.  Pulmonary:     Effort: Pulmonary effort is normal. No respiratory distress.     Breath sounds: Normal breath sounds. No wheezing or rales.  Abdominal:     General: Bowel sounds are normal. There is no distension.     Palpations:  Abdomen is soft.     Tenderness: There is no abdominal tenderness. There is no rebound.  Musculoskeletal:     Cervical back: Normal range of motion.  Skin:    General: Skin is warm and dry.  Neurological:     Mental Status: He is alert.     Motor: Weakness present.     Coordination: Coordination abnormal.     Comments: 3/5 strength lower extremity equal bilateral 4/5 upper extremity strength equal bilateral     Vitals:   08/22/21 1422  BP: 116/84  Pulse: 100  Resp: 18  SpO2: 97%  Weight: 152 lb 12.8 oz (69.3 kg)  Height: '5\' 10"'$  (1.778 m)    Assessment & Plan:  Visit time 35 minutes in face to face communication with patient and coordination of care, additional 10 minutes spent in record review, coordination or care, ordering tests, communicating/referring to other healthcare professionals, documenting in medical records all on the same day of the visit for total time 45 minutes spent on the visit.

## 2021-08-22 NOTE — Patient Instructions (Signed)
We will check the labs today and recommend not to drink alcohol again.

## 2021-08-23 DIAGNOSIS — R531 Weakness: Secondary | ICD-10-CM | POA: Insufficient documentation

## 2021-08-23 DIAGNOSIS — F1010 Alcohol abuse, uncomplicated: Secondary | ICD-10-CM | POA: Insufficient documentation

## 2021-08-23 DIAGNOSIS — R410 Disorientation, unspecified: Secondary | ICD-10-CM | POA: Insufficient documentation

## 2021-08-23 LAB — COMPREHENSIVE METABOLIC PANEL
ALT: 21 U/L (ref 0–53)
AST: 22 U/L (ref 0–37)
Albumin: 4 g/dL (ref 3.5–5.2)
Alkaline Phosphatase: 59 U/L (ref 39–117)
BUN: 19 mg/dL (ref 6–23)
CO2: 32 mEq/L (ref 19–32)
Calcium: 10.5 mg/dL (ref 8.4–10.5)
Chloride: 101 mEq/L (ref 96–112)
Creatinine, Ser: 0.88 mg/dL (ref 0.40–1.50)
GFR: 79.07 mL/min (ref >60.00)
Glucose, Bld: 100 mg/dL — ABNORMAL HIGH (ref 70–99)
Potassium: 4.6 mEq/L (ref 3.5–5.1)
Sodium: 140 mEq/L (ref 135–145)
Total Bilirubin: 0.5 mg/dL (ref 0.2–1.2)
Total Protein: 7.3 g/dL (ref 6.0–8.3)

## 2021-08-23 LAB — URINALYSIS, ROUTINE W REFLEX MICROSCOPIC
Bilirubin Urine: NEGATIVE
Hgb urine dipstick: NEGATIVE
Ketones, ur: NEGATIVE
Leukocytes,Ua: NEGATIVE
Nitrite: NEGATIVE
Specific Gravity, Urine: 1.025 (ref 1.000–1.030)
Total Protein, Urine: NEGATIVE
Urine Glucose: NEGATIVE
Urobilinogen, UA: 1 (ref 0.0–1.0)
pH: 5.5 (ref 5.0–8.0)

## 2021-08-23 LAB — URINE CULTURE: Result:: NO GROWTH

## 2021-08-23 NOTE — Assessment & Plan Note (Addendum)
Per patient request he will be referred to neurology to assess his gait problems and weakness. It is possible that the alcohol overuse could be contributing and known back arthritis could be contributing. He has had prior extensive workup. We did ABI to rule out vascular insufficiency which was normal in the last few months.

## 2021-08-23 NOTE — Assessment & Plan Note (Signed)
Checking CBC, CMP, U/A and urine culture. There was some problems with urinating often. If culture and u/a normal we will prescribe flomax 0.4 mg daily to help.

## 2021-08-23 NOTE — Assessment & Plan Note (Signed)
Intake is higher than recommended. He has been sober for about 10 days now and could have had withdrawal symptoms causing the weakness and confusion. He asks during the visit how long I recommend he stay on the wagon before resuming and I encouraged him to have lifelong sobriety. He has atrophy on MRI brain from last year and has neuropathy (may be related to alcohol or back arthritis). It is possible that the weakness is related to alcohol. His friend adds that there is some mental clarity issues at times which he admits to as well.

## 2021-08-23 NOTE — Assessment & Plan Note (Addendum)
Referral to neurology to assess. It is possible alcohol or back arthritis could be causing. He is wanting to elucidate a clear cause if able.

## 2021-09-01 ENCOUNTER — Ambulatory Visit: Payer: Medicare Other | Admitting: Physician Assistant

## 2021-09-09 ENCOUNTER — Other Ambulatory Visit: Payer: Self-pay | Admitting: Internal Medicine

## 2021-09-09 DIAGNOSIS — F1010 Alcohol abuse, uncomplicated: Secondary | ICD-10-CM

## 2021-09-09 DIAGNOSIS — R270 Ataxia, unspecified: Secondary | ICD-10-CM

## 2021-09-09 DIAGNOSIS — R531 Weakness: Secondary | ICD-10-CM

## 2021-09-19 ENCOUNTER — Ambulatory Visit (INDEPENDENT_AMBULATORY_CARE_PROVIDER_SITE_OTHER): Payer: Medicare Other | Admitting: Internal Medicine

## 2021-09-19 DIAGNOSIS — F1010 Alcohol abuse, uncomplicated: Secondary | ICD-10-CM | POA: Diagnosis not present

## 2021-09-19 DIAGNOSIS — G9332 Myalgic encephalomyelitis/chronic fatigue syndrome: Secondary | ICD-10-CM | POA: Diagnosis not present

## 2021-09-19 MED ORDER — MIRABEGRON ER 25 MG PO TB24: 25.0000 mg | ORAL_TABLET | Freq: Every day | ORAL | 0 refills | Status: DC

## 2021-09-19 NOTE — Patient Instructions (Addendum)
We have sent in myrbetriq to help with the bladder. Take 1 pill daily and this can take 1 month to kick in.  We will see what the neurologist thinks about the weakness.  Keep up the good work with not drinking alcohol.

## 2021-09-19 NOTE — Progress Notes (Unsigned)
   Subjective:   Patient ID: Jose [name], male    DOB: [birth year], 86 y.o.   MRN: [id number]  HPI The patient is an 86 YO man coming in for ongoing concerns. Brought neighbor with him and she states that she has noticed improvement in balance with cessation of alcohol.He disagrees and feels he has had a 15% decline in function and is inclined to resume alcohol as this made him happy. Currently about 1 month sober.   Review of Systems  Constitutional:  Positive for fatigue.  HENT: Negative.    Eyes: Negative.   Respiratory:  Negative for cough, chest tightness and shortness of breath.   Cardiovascular:  Negative for chest pain, palpitations and leg swelling.  Gastrointestinal:  Negative for abdominal distention, abdominal pain, constipation, diarrhea, nausea and vomiting.  Musculoskeletal: Negative.   Skin: Negative.   Neurological:  Positive for weakness.  Psychiatric/Behavioral: Negative.      Objective:  Physical Exam Constitutional:      Appearance: He is well-developed. He is ill-appearing.  HENT:     Head: Normocephalic and atraumatic.  Cardiovascular:     Rate and Rhythm: Normal rate and regular rhythm.  Pulmonary:     Effort: Pulmonary effort is normal. No respiratory distress.     Breath sounds: Normal breath sounds. No wheezing or rales.  Abdominal:     General: Bowel sounds are normal. There is no distension.     Palpations: Abdomen is soft.     Tenderness: There is no abdominal tenderness. There is no rebound.  Musculoskeletal:     Cervical back: Normal range of motion.  Skin:    General: Skin is warm and dry.  Neurological:     Mental Status: He is alert and oriented to person, place, and time.     Coordination: Coordination normal.     Comments: Walking seems improved in terms of balance from prior visit     Vitals:   09/19/21 1604  BP: (!) 144/80  Pulse: 60  Temp: 97.8 F (36.6 C)  TempSrc: Oral  SpO2: 98%  Weight: 152 lb 2 oz (69 kg)   Height: '5\' 10"'$  (1.778 m)    Assessment & Plan:

## 2021-09-20 ENCOUNTER — Encounter: Payer: Self-pay | Admitting: Internal Medicine

## 2021-09-20 NOTE — Assessment & Plan Note (Signed)
He does not see improvement off alcohol although neighbor feels she sees improvement in symptoms. I have advised lifelong sobriety from alcohol. It is uncertain if he will maintain this.

## 2021-09-20 NOTE — Assessment & Plan Note (Addendum)
He has been taking ritalin 5-6 per day for some years. We will get records from current prescriber about this and can prescribe but would not prescribe if using alcohol excessively. He has asked to be referred to [facility] neurology and sees them in the near future. It is unclear to me how much of his symptoms could be related to chronic alcohol overuse. He admits to at least 4-5 drinks per day for years until about 1 month ago. Some questionable withdrawal symptoms with cessation (1 week of feeling poorly and extra weakness/confusion).

## 2021-10-24 ENCOUNTER — Ambulatory Visit (INDEPENDENT_AMBULATORY_CARE_PROVIDER_SITE_OTHER): Payer: Medicare Other | Admitting: Internal Medicine

## 2021-10-24 ENCOUNTER — Encounter: Payer: Self-pay | Admitting: Internal Medicine

## 2021-10-24 VITALS — BP 136/64 | HR 96 | Ht 70.0 in | Wt 157.0 lb

## 2021-10-24 DIAGNOSIS — G9332 Myalgic encephalomyelitis/chronic fatigue syndrome: Secondary | ICD-10-CM | POA: Diagnosis not present

## 2021-10-24 DIAGNOSIS — Z23 Encounter for immunization: Secondary | ICD-10-CM | POA: Diagnosis not present

## 2021-10-24 DIAGNOSIS — R350 Frequency of micturition: Secondary | ICD-10-CM

## 2021-10-24 DIAGNOSIS — M545 Low back pain, unspecified: Secondary | ICD-10-CM | POA: Diagnosis not present

## 2021-10-24 MED ORDER — OXYBUTYNIN CHLORIDE ER 10 MG PO TB24: 10.0000 mg | ORAL_TABLET | Freq: Every day | ORAL | 1 refills | Status: DC

## 2021-10-24 MED ORDER — BACLOFEN 10 MG PO TABS: 10.0000 mg | ORAL_TABLET | Freq: Two times a day (BID) | ORAL | 0 refills | Status: DC

## 2021-10-24 NOTE — Patient Instructions (Addendum)
We have sent in baclofen to use for the back.  I have sent in oxybutynin for the overactive bladder.  We have given you the flu shot today.

## 2021-10-24 NOTE — Progress Notes (Unsigned)
   Subjective:   Patient ID: Jose [name], male    DOB: 05-27-1935, 86 y.o.   MRN: [id number]  HPI The patient is an 86 YO man coming in for follow up. Seeing [facility] in about 2 weeks.  Review of Systems  Objective:  Physical Exam  There were no vitals filed for this visit.  Assessment & Plan:

## 2021-10-26 ENCOUNTER — Encounter: Payer: Self-pay | Admitting: Internal Medicine

## 2021-10-26 DIAGNOSIS — R350 Frequency of micturition: Secondary | ICD-10-CM | POA: Insufficient documentation

## 2021-10-26 DIAGNOSIS — M549 Dorsalgia, unspecified: Secondary | ICD-10-CM | POA: Insufficient documentation

## 2021-10-26 NOTE — Assessment & Plan Note (Signed)
Rx baclofen which he has taken successfully in the past for pain in the back. No red flag signs to indicate need for imaging.

## 2021-10-26 NOTE — Assessment & Plan Note (Signed)
Previously we had prescribed myrbetriq which was too expensive so we will try oxybutynin instead. Cautioned that this can cause dizziness, constipation, dry mouth. Rx done today.

## 2021-10-26 NOTE — Assessment & Plan Note (Signed)
He is still having problems with this. No neighbor present today but subjectively he feels stable from 1-2 months ago but not improved. At last visit neighbor felt he was more stable off alcohol. He did not agree and was not sure about staying free of alcohol life long.

## 2021-11-14 ENCOUNTER — Encounter (HOSPITAL_COMMUNITY): Payer: Self-pay | Admitting: *Deleted

## 2021-11-14 ENCOUNTER — Emergency Department (HOSPITAL_COMMUNITY): Payer: Medicare Other

## 2021-11-14 ENCOUNTER — Observation Stay (HOSPITAL_COMMUNITY): Payer: Medicare Other

## 2021-11-14 ENCOUNTER — Inpatient Hospital Stay (HOSPITAL_COMMUNITY)
Admission: EM | Admit: 2021-11-14 | Discharge: 2021-11-20 | DRG: 065 | Disposition: A | Discharge status: Rehabilitation Facility | Payer: Medicare Other | Attending: Internal Medicine | Admitting: Internal Medicine

## 2021-11-14 ENCOUNTER — Telehealth: Payer: Self-pay | Admitting: Internal Medicine

## 2021-11-14 DIAGNOSIS — G9340 Encephalopathy, unspecified: Secondary | ICD-10-CM

## 2021-11-14 DIAGNOSIS — F1010 Alcohol abuse, uncomplicated: Secondary | ICD-10-CM | POA: Diagnosis not present

## 2021-11-14 DIAGNOSIS — M1990 Unspecified osteoarthritis, unspecified site: Secondary | ICD-10-CM | POA: Diagnosis present

## 2021-11-14 DIAGNOSIS — I10 Essential (primary) hypertension: Secondary | ICD-10-CM | POA: Diagnosis present

## 2021-11-14 DIAGNOSIS — I6381 Other cerebral infarction due to occlusion or stenosis of small artery: Secondary | ICD-10-CM | POA: Diagnosis not present

## 2021-11-14 DIAGNOSIS — E785 Hyperlipidemia, unspecified: Secondary | ICD-10-CM | POA: Diagnosis present

## 2021-11-14 DIAGNOSIS — G4733 Obstructive sleep apnea (adult) (pediatric): Secondary | ICD-10-CM | POA: Diagnosis present

## 2021-11-14 DIAGNOSIS — M25521 Pain in right elbow: Secondary | ICD-10-CM

## 2021-11-14 DIAGNOSIS — K219 Gastro-esophageal reflux disease without esophagitis: Secondary | ICD-10-CM | POA: Diagnosis not present

## 2021-11-14 DIAGNOSIS — E512 Wernicke's encephalopathy: Secondary | ICD-10-CM | POA: Diagnosis present

## 2021-11-14 DIAGNOSIS — E519 Thiamine deficiency, unspecified: Secondary | ICD-10-CM | POA: Diagnosis present

## 2021-11-14 DIAGNOSIS — E871 Hypo-osmolality and hyponatremia: Secondary | ICD-10-CM | POA: Diagnosis present

## 2021-11-14 DIAGNOSIS — E872 Acidosis, unspecified: Secondary | ICD-10-CM | POA: Diagnosis present

## 2021-11-14 DIAGNOSIS — I639 Cerebral infarction, unspecified: Secondary | ICD-10-CM | POA: Diagnosis present

## 2021-11-14 DIAGNOSIS — Z79899 Other long term (current) drug therapy: Secondary | ICD-10-CM

## 2021-11-14 DIAGNOSIS — E8809 Other disorders of plasma-protein metabolism, not elsewhere classified: Secondary | ICD-10-CM | POA: Diagnosis present

## 2021-11-14 DIAGNOSIS — M109 Gout, unspecified: Secondary | ICD-10-CM | POA: Diagnosis present

## 2021-11-14 DIAGNOSIS — R0902 Hypoxemia: Secondary | ICD-10-CM | POA: Diagnosis present

## 2021-11-14 DIAGNOSIS — R270 Ataxia, unspecified: Secondary | ICD-10-CM | POA: Diagnosis present

## 2021-11-14 DIAGNOSIS — K589 Irritable bowel syndrome without diarrhea: Secondary | ICD-10-CM | POA: Diagnosis present

## 2021-11-14 DIAGNOSIS — Z87891 Personal history of nicotine dependence: Secondary | ICD-10-CM

## 2021-11-14 DIAGNOSIS — Z8249 Family history of ischemic heart disease and other diseases of the circulatory system: Secondary | ICD-10-CM

## 2021-11-14 DIAGNOSIS — E876 Hypokalemia: Secondary | ICD-10-CM | POA: Diagnosis present

## 2021-11-14 DIAGNOSIS — F1090 Alcohol use, unspecified, uncomplicated: Secondary | ICD-10-CM | POA: Diagnosis present

## 2021-11-14 HISTORY — DX: Myoneural disorder, unspecified: G70.9

## 2021-11-14 LAB — ETHANOL: Alcohol, Ethyl (B): <10 mg/dL (ref <10)

## 2021-11-14 LAB — LACTIC ACID, PLASMA: Lactic Acid, Venous: 1.2 mmol/L (ref 0.5–1.9)

## 2021-11-14 LAB — CBC WITH DIFFERENTIAL/PLATELET
Abs Immature Granulocytes: 0.21 10*3/uL — ABNORMAL HIGH (ref 0.00–0.07)
Basophils Absolute: 0.1 10*3/uL (ref 0.0–0.1)
Basophils Relative: 1 %
Eosinophils Absolute: 0 10*3/uL (ref 0.0–0.5)
Eosinophils Relative: 0 %
HCT: 48.8 % (ref 39.0–52.0)
Hemoglobin: 15.9 g/dL (ref 13.0–17.0)
Immature Granulocytes: 1 %
Lymphocytes Relative: 12 %
Lymphs Abs: 1.8 10*3/uL (ref 0.7–4.0)
MCH: 30.8 pg (ref 26.0–34.0)
MCHC: 32.6 g/dL (ref 30.0–36.0)
MCV: 94.4 fL (ref 80.0–100.0)
Monocytes Absolute: 2.2 10*3/uL — ABNORMAL HIGH (ref 0.1–1.0)
Monocytes Relative: 15 %
Neutro Abs: 10.4 10*3/uL — ABNORMAL HIGH (ref 1.7–7.7)
Neutrophils Relative %: 71 %
Platelets: 337 10*3/uL (ref 150–400)
RBC: 5.17 MIL/uL (ref 4.22–5.81)
RDW: 12.8 % (ref 11.5–15.5)
WBC: 14.7 10*3/uL — ABNORMAL HIGH (ref 4.0–10.5)
nRBC: 0 % (ref 0.0–0.2)

## 2021-11-14 LAB — URINALYSIS, ROUTINE W REFLEX MICROSCOPIC
Bilirubin Urine: NEGATIVE
Glucose, UA: NEGATIVE mg/dL
Hgb urine dipstick: NEGATIVE
Ketones, ur: 20 mg/dL — AB
Leukocytes,Ua: NEGATIVE
Nitrite: NEGATIVE
Protein, ur: NEGATIVE mg/dL
Specific Gravity, Urine: 1.02 (ref 1.005–1.030)
pH: 5 (ref 5.0–8.0)

## 2021-11-14 LAB — AMMONIA: Ammonia: 13 umol/L (ref 9–35)

## 2021-11-14 LAB — COMPREHENSIVE METABOLIC PANEL
ALT: 15 U/L (ref 0–44)
AST: 21 U/L (ref 15–41)
Albumin: 3.7 g/dL (ref 3.5–5.0)
Alkaline Phosphatase: 63 U/L (ref 38–126)
Anion gap: 14 (ref 5–15)
BUN: 16 mg/dL (ref 8–23)
CO2: 22 mmol/L (ref 22–32)
Calcium: 9.6 mg/dL (ref 8.9–10.3)
Chloride: 99 mmol/L (ref 98–111)
Creatinine, Ser: 1.04 mg/dL (ref 0.61–1.24)
GFR, Estimated: >60 mL/min (ref >60)
Glucose, Bld: 126 mg/dL — ABNORMAL HIGH (ref 70–99)
Potassium: 4.1 mmol/L (ref 3.5–5.1)
Sodium: 135 mmol/L (ref 135–145)
Total Bilirubin: 1.3 mg/dL — ABNORMAL HIGH (ref 0.3–1.2)
Total Protein: 7.4 g/dL (ref 6.5–8.1)

## 2021-11-14 LAB — LIPASE, BLOOD: Lipase: 39 U/L (ref 11–51)

## 2021-11-14 MED ORDER — ACETAMINOPHEN 160 MG/5ML PO SOLN: 650.0000 mg | ORAL | Status: DC | PRN

## 2021-11-14 MED ORDER — ACETAMINOPHEN 325 MG PO TABS
650.0000 mg | ORAL_TABLET | ORAL | Status: DC | PRN
  Administered 2021-11-16 – 2021-11-17 (×3): 650 mg via ORAL
  Filled 2021-11-14 (×3): qty 2

## 2021-11-14 MED ORDER — SENNOSIDES-DOCUSATE SODIUM 8.6-50 MG PO TABS: 1.0000 | ORAL_TABLET | Freq: Every evening | ORAL | Status: DC | PRN

## 2021-11-14 MED ORDER — ASPIRIN 81 MG PO TBEC
81.0000 mg | DELAYED_RELEASE_TABLET | Freq: Every day | ORAL | Status: DC
  Administered 2021-11-14 – 2021-11-20 (×7): 81 mg via ORAL
  Filled 2021-11-14 (×7): qty 1

## 2021-11-14 MED ORDER — GADOBUTROL 1 MMOL/ML IV SOLN
7.0000 mL | Freq: Once | INTRAVENOUS | Status: AC | PRN
  Administered 2021-11-14: 7 mL via INTRAVENOUS

## 2021-11-14 MED ORDER — ENOXAPARIN SODIUM 40 MG/0.4ML IJ SOSY
40.0000 mg | PREFILLED_SYRINGE | INTRAMUSCULAR | Status: DC
  Administered 2021-11-14 – 2021-11-19 (×6): 40 mg via SUBCUTANEOUS
  Filled 2021-11-14 (×6): qty 0.4

## 2021-11-14 MED ORDER — THIAMINE HCL 100 MG/ML IJ SOLN: 250.0000 mg | Freq: Every day | INTRAVENOUS | Status: DC

## 2021-11-14 MED ORDER — PANTOPRAZOLE SODIUM 40 MG PO TBEC
40.0000 mg | DELAYED_RELEASE_TABLET | Freq: Every day | ORAL | Status: DC
  Administered 2021-11-15 – 2021-11-20 (×6): 40 mg via ORAL
  Filled 2021-11-14 (×6): qty 1

## 2021-11-14 MED ORDER — ACETAMINOPHEN 650 MG RE SUPP: 650.0000 mg | RECTAL | Status: DC | PRN

## 2021-11-14 MED ORDER — SODIUM CHLORIDE 0.9 % IV BOLUS
1000.0000 mL | Freq: Once | INTRAVENOUS | Status: AC
  Administered 2021-11-14: 1000 mL via INTRAVENOUS

## 2021-11-14 MED ORDER — THIAMINE HCL 100 MG/ML IJ SOLN
500.0000 mg | Freq: Three times a day (TID) | INTRAVENOUS | Status: DC
  Administered 2021-11-14 – 2021-11-15 (×3): 500 mg via INTRAVENOUS
  Filled 2021-11-14 (×5): qty 5

## 2021-11-14 MED ORDER — THIAMINE HCL 100 MG/ML IJ SOLN: 100.0000 mg | Freq: Every day | INTRAMUSCULAR | Status: DC

## 2021-11-14 MED ORDER — STROKE: EARLY STAGES OF RECOVERY BOOK
Freq: Once | Status: AC
  Filled 2021-11-14: qty 1

## 2021-11-14 NOTE — ED Provider Notes (Signed)
[facility] DEPT Provider Note   CSN: [id number] Arrival date & time: 11/14/21  1119     History  Chief Complaint  Patient presents with   Generalized Pain    Jose [name] is a 86 y.o. male with history of chronic alcoholism, Warnicke's encephalopathy, confusion, presenting from home with several complaints.  The patient reports he has had body aches all over.  He reports that he has been drinking, cannot recall when or where his last drink was.  He also complains of generalized weakness and feeling tired.  External records review show that the patient had similar evaluations in the office by his PCP in July and August, Dr Sharlet Salina  Patient gave permission to contact Nelda Bucks, his significant other listed on emergency contacts.  She reports she is a "friend."  She confirms he has had difficulty with walking, weakness and balance for "years," and was told this was related to alcohol use. She reports he has had waxning and waning confusion the past 4 days, and fatigue.  He normally drinks beer and liquor.  She says he has not had a drink since Thursday (5 days ago).  HPI     Home Medications Prior to Admission medications   Medication Sig Start Date End Date Taking? Authorizing Provider  ALPRAZolam Duanne Moron) 0.5 MG tablet Take 0.5 mg by mouth at bedtime as needed for sleep. Taken 1.5 daily 08/16/18   [provider]  baclofen (LIORESAL) 10 MG tablet Take 1-2 tablets (10-20 mg total) by mouth 2 (two) times daily. 10/24/21   Hoyt Koch, MD  celecoxib (CELEBREX) 200 MG capsule Take 200 mg by mouth daily.     [provider]  clotrimazole (CLOTRIMAZOLE ANTI-FUNGAL) 1 % cream Apply 1 application topically 2 (two) times daily. 02/25/21   Hoyt Koch, MD  cyanocobalamin 1000 MCG tablet Take 1,000 mcg by mouth daily.    [provider]  famotidine (PEPCID) 20 MG tablet Take 20 mg by mouth daily.    [provider]  hyoscyamine (LEVBID) 0.375 MG 12 hr tablet Take 0.375 mg by mouth daily. I/2 tab occasionally    [provider]  methylphenidate (RITALIN) 5 MG tablet Take 5 mg by mouth daily as needed for anxiety. Takes 5-6  per day 07/19/18   [provider]  omeprazole (PRILOSEC) 20 MG capsule Take 20 mg by mouth every morning.     [provider]  oxybutynin (DITROPAN XL) 10 MG 24 hr tablet Take 1 tablet (10 mg total) by mouth at bedtime. 10/24/21   Hoyt Koch, MD  predniSONE (STERAPRED UNI-PAK 21 TAB) 10 MG (21) TBPK tablet Take by mouth as directed. 07/05/21   [provider]  thiamine (VITAMIN B-1) 100 MG tablet Take 100 mg by mouth daily.    [provider]  thiamine 100 MG tablet Take 1 tablet (100 mg total) by mouth daily. 05/25/20   Mercy Riding, MD  vitamin C (ASCORBIC ACID) 500 MG tablet Take 500 mg by mouth daily.    [provider]      Allergies    Patient has no known allergies.    Review of Systems   Review of Systems  Physical Exam Updated Vital Signs BP 129/88   Pulse 65   Temp 98.8 F (37.1 C)   Resp (!) 25   Ht '5\' 4"'$  (1.626 m)   Wt 71.2 kg   SpO2 93%   BMI 26.95 kg/m  Physical Exam Constitutional:      General: He is not in acute distress.    Comments: Tired, sleeping, yawning  HENT:     Head: Normocephalic and atraumatic.  Eyes:     Conjunctiva/sclera: Conjunctivae normal.     Pupils: Pupils are equal, round, and reactive to light.  Cardiovascular:     Rate and Rhythm: Normal rate and regular rhythm.  Pulmonary:     Effort: Pulmonary effort is normal. No respiratory distress.  Abdominal:     General: There is no distension.     Tenderness: There is no abdominal tenderness.  Skin:    General: Skin is warm and dry.  Neurological:     General: No focal deficit present.     Mental Status: He is alert.     Comments: Patient moving all extremities but would not cooperate with full  neurological exam.  He has no evident facial droop.  Speech is clear.     ED Results / Procedures / Treatments   Labs (all labs ordered are listed, but only abnormal results are displayed) Labs Reviewed  CBC WITH DIFFERENTIAL/PLATELET - Abnormal; Notable for the following components:      Result Value   WBC 14.7 (*)    Neutro Abs 10.4 (*)    Monocytes Absolute 2.2 (*)    Abs Immature Granulocytes 0.21 (*)    All other components within normal limits  COMPREHENSIVE METABOLIC PANEL - Abnormal; Notable for the following components:   Glucose, Bld 126 (*)    Total Bilirubin 1.3 (*)    All other components within normal limits  LIPASE, BLOOD  ETHANOL  AMMONIA  URINALYSIS, ROUTINE W REFLEX MICROSCOPIC  VITAMIN B1  VITAMIN B12  FOLATE  TSH  LIPID PANEL  HEMOGLOBIN A1C  LACTIC ACID, PLASMA  PROCALCITONIN    EKG EKG Interpretation  Date/Time:  Monday November 14 2021 14:16:04 EDT Ventricular Rate:  143 PR Interval:  135 QRS Duration: 75 QT Interval:  278 QTC Calculation: 429 R Axis:   101 Text Interpretation: Sinus tachycardia with baseline artifact Confirmed by Octaviano Glow [phone]) on 11/14/2021 4:33:29 PM  Radiology MR BRAIN WO CONTRAST  Result Date: 11/14/2021 CLINICAL DATA:  Follow-up questionable pontine lesion seen EXAM: MRI HEAD WITHOUT CONTRAST TECHNIQUE: Multiplanar, multiecho pulse sequences of the brain and surrounding structures were obtained without intravenous contrast. COMPARISON:  Same-day CT head, brain MRI 05/18/2020 FINDINGS: Brain: There is a small focus of diffusion restriction in the right corona radiata with associated FLAIR signal abnormality consistent with small acute infarct. There is no associated hemorrhage or mass effect. There is no other evidence of acute infarct. Specifically, there is no acute abnormality in the pons. There is no acute intracranial hemorrhage or extra-axial fluid collection. There is unchanged background parenchymal volume  loss with prominence of the ventricular system and extra-axial CSF spaces. FLAIR signal abnormality in the supratentorial white matter is nonspecific but likely reflects sequela of chronic small vessel ischemic change, unchanged since the prior MRI from 2022. Punctate chronic microhemorrhages in the right frontal lobe are unchanged (10-46). There is no solid mass lesion. There is no mass effect or midline shift. Vascular: Normal flow voids. Skull and upper cervical spine: Normal marrow signal. Sinuses/Orbits: The paranasal sinuses are clear. Bilateral lens implants are in place. The globes and orbits are otherwise unremarkable. Other: None. IMPRESSION: 1. Small acute infarct in the right corona radiata without hemorrhage or mass effect. 2. No other acute intracranial pathology. No acute finding in  the pons as questioned on the prior CT. Electronically Signed   By: Valetta Mole M.D.   On: 11/14/2021 19:42   CT Head Wo Contrast  Result Date: 11/14/2021 CLINICAL DATA:  Altered mental status EXAM: CT HEAD WITHOUT CONTRAST TECHNIQUE: Contiguous axial images were obtained from the base of the skull through the vertex without intravenous contrast. RADIATION DOSE REDUCTION: This exam was performed according to the departmental dose-optimization program which includes automated exposure control, adjustment of the mA and/or kV according to patient size and/or use of iterative reconstruction technique. COMPARISON:  CT head 05/17/2020 FINDINGS: Brain: No evidence of hemorrhage, extra-axial collection or mass lesion/mass effect. There is ventriculomegaly which is likely related to the degree of volume loss. There is a new focal hypodense lesion in the upper pons (series 2, image 13). Sequela of severe chronic microvascular ischemic change and advanced generalized volume loss. Vascular: No hyperdense vessel or unexpected calcification. Skull: Normal. Negative for fracture or focal lesion. Sinuses/Orbits: Bilateral lens  replacement. Other: None. IMPRESSION: New focal hypodense lesion in the ventral pons on the left may be artifactual or represent an age-indeterminate infarct. Recommend MRI for further evaluation. Electronically Signed   By: Marin Roberts M.D.   On: 11/14/2021 16:34    Procedures Procedures    Medications Ordered in ED Medications  thiamine (VITAMIN B1) 500 mg in normal saline (50 mL) IVPB (has no administration in time range)    Followed by  thiamine (VITAMIN B1) 250 mg in sodium chloride 0.9 % 50 mL IVPB (has no administration in time range)    Followed by  thiamine (VITAMIN B1) injection 100 mg (has no administration in time range)  pantoprazole (PROTONIX) EC tablet 40 mg (has no administration in time range)   stroke: early stages of recovery book (has no administration in time range)  acetaminophen (TYLENOL) tablet 650 mg (has no administration in time range)    Or  acetaminophen (TYLENOL) 160 MG/5ML solution 650 mg (has no administration in time range)    Or  acetaminophen (TYLENOL) suppository 650 mg (has no administration in time range)  senna-docusate (Senokot-S) tablet 1 tablet (has no administration in time range)  enoxaparin (LOVENOX) injection 40 mg (has no administration in time range)  aspirin EC tablet 81 mg (has no administration in time range)  sodium chloride 0.9 % bolus 1,000 mL (0 mLs Intravenous Stopped 11/14/21 2107)    ED Course/ Medical Decision Making/ A&P Clinical Course as of 11/14/21 2109  Mon Nov 14, 2021  1946 Small acute infarct corona radiata - will page neurology and admit patient [MT]  2005 Hypoxia is machine error, poor pleth, on my exam he is not hypoxic [MT]  2107 Admitted to hospitalist Dr Trilby Drummer for transfer to [facility].  Neurology consulted [MT]    Clinical Course User Index [MT] Milynn Quirion, Carola Rhine, MD                           Medical Decision Making Amount and/or Complexity of Data Reviewed Radiology: ordered.  Risk Decision regarding  hospitalization.   This patient presents to the Emergency Department with complaint of altered mental status.  This involves an extensive number of treatment options, and is a complaint that carries with it a high risk of complications and morbidity.  The differential diagnosis includes hypoglycemia vs metabolic encephalopathy vs infection (including cystitis) vs ICH vs stroke vs polypharmacy vs other  I ordered, reviewed, and interpreted labs, including minor  leukocytosis.  Ethanol level negative.  Ammonia level negative. I ordered medication fluid bolus, thiamine I ordered imaging studies which included CT head, MRI brain I independently visualized and interpreted imaging which showed small infarct of the right corona radiata and the monitor tracing which showed normal sinus rhythm Additional history was obtained from patient's significant other by phone External records reviewed including office notes by PCP for recurring episodes of confusion, prior diagnosis of Wernicke's encephalopathy I personally reviewed the patients ECG which showed sinus rhythm with no acute ischemic findings  I consulted neurology and discussed lab and imaging findings.  Please see neurologist note.  Recommendations are appreciated  After the interventions stated above, I reevaluated the patient and found he remained stable.         Final Clinical Impression(s) / ED Diagnoses Final diagnoses:  Cerebrovascular accident (CVA), unspecified mechanism [facility])    Rx / DC Orders ED Discharge Orders     None         Wyvonnia Dusky, MD 11/14/21 2109

## 2021-11-14 NOTE — Plan of Care (Signed)
Briefly, [name] is a 86 y.o. male with hx of IBS, mild sleep apnea, chronic EtOh use c/b wernicke's encephalopathy who quit EtOH in July of this year and then resumed and quit again a week ago. He has baseline waxing and waning confusion but was noted to be more confused over the last 4 days.  [place] with ? L pontine stroke, MRI brain shows a punctate R corona radiata stroke. In addition, he was noted to have leukocytosis on CBC. EtOH levels negative. Neurology consulted for evaluation and workup.   Recs:  Incidental R corona radiata stroke:(does not explain encephalopathy) - Frequent Neuro checks per stroke unit protocol - Recommend Vascular imaging with MRA Angio Head without contrast and US Carotid doppler - Recommend obtaining TTE - Recommend obtaining Lipid panel with LDL - Please start statin if LDL > 70 - Recommend HbA1c - Antithrombotic - Aspirin '81mg'$  daily. - Recommend DVT ppx - SBP goal - permissive hypertension first 24 h < 220/110. Held home meds.  - Recommend Telemetry monitoring for arrythmia - Recommend bedside swallow screen prior to PO intake. - Stroke education booklet - Recommend PT/OT/SLP consult - Recommend Urine Tox screen.  Encephalopathy: - Thiamine levels urgent, followed by high dose thiamine replacement. - Vit B12, folate, TSH. - rEEG. - CIWA protocol. - judicious use of sedating medications. - recommend infectious/metabolic workup with UA with reflex to Ucx, CXR, procalcitonin, lactate.  I will try to see him tonight at [facility] as time permits.  [place] Pager Number [phone]

## 2021-11-14 NOTE — ED Provider Triage Note (Signed)
Emergency Medicine Provider Triage Evaluation Note  Jose [name] , a 86 y.o. male  was evaluated in triage.  Pt complains of body aches and episodes of confusion over the last few days.Marland Kitchen  History of significant alcohol abuse over many years.  Had quit drinking back in July for 30 days, had an episode of confusion and many other symptoms over a week, that eventually resolved.  Saw PCP afterwards, was recommended to abstain from alcohol going forward.  Recently started drinking again about a month ago, stopped drinking a week ago, has had worsening confusion and body aches and shaking since then.  Unsure fevers.  Possibly some nausea and vomiting.  Patient with significant confusion yesterday, still with some confusion today.  Review of Systems  Positive: See Negative: Above  Physical Exam  BP 124/78   Pulse (!) 116   Temp 97.7 F (36.5 C) (Oral)   Resp 16   Ht '5\' 4"'$  (1.626 m)   Wt 71.2 kg   SpO2 94%   BMI 26.95 kg/m  Gen:   Awake, oriented to self, event, and place overall, occasionally gives nonsensical answers to questions Resp:  Normal effort  MSK:   Moves extremities with some difficulty, sitting sideways in chair, unable to sit himself upright due to pain and possible discoordination Other:  Fasciculations appreciated on the neck and upper extremities.  Appears disheveled.  Global rotating myalgias on exam.  Medical Decision Making  Medically screening exam initiated at 1:32 PM.  Appropriate orders placed.  Jose [name] was informed that the remainder of the evaluation will be completed by another provider, this initial triage assessment does not replace that evaluation, and the importance of remaining in the ED until their evaluation is complete.  Discussed patient with triage nurse, patient needs to come back to next available room soon as possible.  Concern for progressing alcohol withdrawal and possible complications.   Prince Rome, PA-C 70/35/00  1337

## 2021-11-14 NOTE — ED Notes (Signed)
EDP notified pt passed SSS

## 2021-11-14 NOTE — H&P (Signed)
History and Physical   GERVASE COLBERG BMW:[id number] DOB: [birth year] DOA: 11/14/2021  PCP: Hoyt Koch, MD   Patient coming from: Home  Chief Complaint: Pain, confusion  HPI: Jose [name] is a 86 y.o. male with medical history significant of alcohol use, GERD, thiamine deficiency, ataxia presenting with pain.  Initially presented for ongoing generalized pain.  With additional information from friend EDP was able to determine the patient had stopped drinking 4 to 5 days ago.  Also that he has years of issues with his balance and weakness however he has had significant increase in his confusion and weakness in the last 4 to 5 days (patient initially stated he did not know when he quit in the ED, on my exam he states that his girlfriend is making it up and that it was 4 to 5 weeks ago).  Denies fevers, chills, chest pain, shortness breath, Donnell pain, constipation, diarrhea, nausea, vomiting.  ED Course: Vital signs in the ED significant for heart rate in the 40s to 120s, blood pressure in the 272Z to 366 systolic, respiratory rate in the 20s to 30s.  Episode of hypoxia documented however this has not been persistent.  Lab work-up included CMP with glucose 126, T. bili 1.3.  CBC with leukocytosis to 14.7.  Lipase normal.  Urinalysis pending.  Ethanol level negative.  Ammonia level negative.  Pending labs include B1, B12, folate, TSH.  CT head showed new focal hypodense lesion.  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  Neurology was consulted and recommended stroke work-up as well as administration of thiamine and further encephalopathy work-up.  Patient received thiamine and IV fluids in the ED.  Review of Systems: As per HPI otherwise all other systems reviewed and are negative.  Past Medical History:  Diagnosis Date   Acute meniscal tear of knee    RIGHT KNEE   Arthritis    Hives    PER PT UNKNOWN WHY   IBS (irritable bowel syndrome)    Mild  sleep apnea    PER STUDY  11-30-2010--  NO CPAP   Spinal stenosis     Past Surgical History:  Procedure Laterality Date   APPENDECTOMY     KNEE ARTHROSCOPY WITH LATERAL MENISECTOMY Right 04/12/2012   Procedure: RIGHT KNEE ARTHROSCOPY WITH PARTIAL MEDIAL AND LATERAL MENISECTOMY;  Surgeon: Magnus Sinning, MD;  Location: [facility];  Service: Orthopedics;  Laterality: Right;   SHOULDER ARTHROSCOPY WITH ROTATOR CUFF REPAIR AND SUBACROMIAL DECOMPRESSION  12-21-1999  &  01-28-2004    Social History  reports that he has quit smoking. His smoking use included cigarettes. He has never used smokeless tobacco. He reports current alcohol use. He reports that he does not use drugs.  No Known Allergies  Family History  Problem Relation Age of Onset   Heart disease Mother    Heart disease Father   Reviewed on admission  Prior to Admission medications   Medication Sig Start Date End Date Taking? Authorizing Provider  ALPRAZolam Duanne Moron) 0.5 MG tablet Take 0.5 mg by mouth at bedtime as needed for sleep. Taken 1.5 daily 08/16/18   [provider]  baclofen (LIORESAL) 10 MG tablet Take 1-2 tablets (10-20 mg total) by mouth 2 (two) times daily. 10/24/21   Hoyt Koch, MD  celecoxib (CELEBREX) 200 MG capsule Take 200 mg by mouth daily.     [provider]  clotrimazole (CLOTRIMAZOLE ANTI-FUNGAL) 1 % cream Apply 1 application topically 2 (  two) times daily. 02/25/21   Hoyt Koch, MD  cyanocobalamin 1000 MCG tablet Take 1,000 mcg by mouth daily.    [provider]  famotidine (PEPCID) 20 MG tablet Take 20 mg by mouth daily.    [provider]  hyoscyamine (LEVBID) 0.375 MG 12 hr tablet Take 0.375 mg by mouth daily. I/2 tab occasionally    [provider]  methylphenidate (RITALIN) 5 MG tablet Take 5 mg by mouth daily as needed for anxiety. Takes 5-6  per day 07/19/18   [provider]  omeprazole (PRILOSEC) 20 MG capsule  Take 20 mg by mouth every morning.     [provider]  oxybutynin (DITROPAN XL) 10 MG 24 hr tablet Take 1 tablet (10 mg total) by mouth at bedtime. 10/24/21   Hoyt Koch, MD  predniSONE (STERAPRED UNI-PAK 21 TAB) 10 MG (21) TBPK tablet Take by mouth as directed. 07/05/21   [provider]  thiamine (VITAMIN B-1) 100 MG tablet Take 100 mg by mouth daily.    [provider]  thiamine 100 MG tablet Take 1 tablet (100 mg total) by mouth daily. 05/25/20   Mercy Riding, MD  vitamin C (ASCORBIC ACID) 500 MG tablet Take 500 mg by mouth daily.    [provider]    Physical Exam: Vitals:   11/14/21 1800 11/14/21 1815 11/14/21 2000 11/14/21 2015  BP: (!) 119/98 139/86 131/63 129/88  Pulse: (!) 43 (!) 55 (!) 102 65  Resp: (!) 31 (!) 28 (!) 27 (!) 25  Temp:    98.8 F (37.1 C)  TempSrc:      SpO2: (!) 77% (!) 80% 93% 93%  Weight:      Height:        Physical Exam Constitutional:      General: He is not in acute distress.    Appearance: Normal appearance.  HENT:     Head: Normocephalic and atraumatic.     Mouth/Throat:     Mouth: Mucous membranes are moist.     Pharynx: Oropharynx is clear.  Eyes:     Extraocular Movements: Extraocular movements intact.     Pupils: Pupils are equal, round, and reactive to light.  Cardiovascular:     Rate and Rhythm: Normal rate and regular rhythm.     Pulses: Normal pulses.     Heart sounds: Normal heart sounds.  Pulmonary:     Effort: Pulmonary effort is normal. No respiratory distress.     Breath sounds: Normal breath sounds.  Abdominal:     General: Bowel sounds are normal. There is no distension.     Palpations: Abdomen is soft.     Tenderness: There is no abdominal tenderness.  Musculoskeletal:        General: No swelling or deformity.  Skin:    General: Skin is warm and dry.  Neurological:     Comments: Mental Status: Patient is awake, alert, oriented No signs of aphasia or neglect Cranial  Nerves: II: Pupils equal, round, and reactive to light.   III,IV, VI: EOMI without ptosis or diploplia.  V: Facial sensation is symmetric to light touch. VII: Facial movement is symmetric.  VIII: hearing is intact to voice X: Uvula elevates symmetrically XI: Shoulder shrug is symmetric. XII: tongue is midline without atrophy or fasciculations.  Motor: Good effort thorughout, at Least 4-5/5 bilateral UE (some limitation on the right due to shoulder pain), 4-5/5 bilateral lower extremitiy  Sensory: Sensation is grossly intact bilateral  UEs & LEs Cerebellar: Finger-Nose intact bilalat, but limited on the right due to shoulder pain    Labs on Admission: I have personally reviewed following labs and imaging studies  CBC: Recent Labs  Lab 11/14/21 1345  WBC 14.7*  NEUTROABS 10.4*  HGB 15.9  HCT 48.8  MCV 94.4  PLT 932    Basic Metabolic Panel: Recent Labs  Lab 11/14/21 1345  NA 135  K 4.1  CL 99  CO2 22  GLUCOSE 126*  BUN 16  CREATININE 1.04  CALCIUM 9.6    GFR: Estimated Creatinine Clearance: 47.9 mL/min (by C-G formula based on SCr of 1.04 mg/dL).  Liver Function Tests: Recent Labs  Lab 11/14/21 1345  AST 21  ALT 15  ALKPHOS 63  BILITOT 1.3*  PROT 7.4  ALBUMIN 3.7    Urine analysis:    Component Value Date/Time   COLORURINE AMBER (A) 11/14/2021 2224   APPEARANCEUR HAZY (A) 11/14/2021 2224   LABSPEC 1.020 11/14/2021 2224   PHURINE 5.0 11/14/2021 2224   GLUCOSEU NEGATIVE 11/14/2021 2224   GLUCOSEU NEGATIVE 08/22/2021 1529   HGBUR NEGATIVE 11/14/2021 2224   BILIRUBINUR NEGATIVE 11/14/2021 2224   KETONESUR 20 (A) 11/14/2021 2224   PROTEINUR NEGATIVE 11/14/2021 2224   UROBILINOGEN 1.0 08/22/2021 1529   NITRITE NEGATIVE 11/14/2021 2224   LEUKOCYTESUR NEGATIVE 11/14/2021 2224    Radiological Exams on Admission: MR BRAIN WO CONTRAST  Result Date: 11/14/2021 CLINICAL DATA:  Follow-up questionable pontine lesion seen EXAM: MRI HEAD WITHOUT CONTRAST  TECHNIQUE: Multiplanar, multiecho pulse sequences of the brain and surrounding structures were obtained without intravenous contrast. COMPARISON:  Same-day CT head, brain MRI 05/18/2020 FINDINGS: Brain: There is a small focus of diffusion restriction in the right corona radiata with associated FLAIR signal abnormality consistent with small acute infarct. There is no associated hemorrhage or mass effect. There is no other evidence of acute infarct. Specifically, there is no acute abnormality in the pons. There is no acute intracranial hemorrhage or extra-axial fluid collection. There is unchanged background parenchymal volume loss with prominence of the ventricular system and extra-axial CSF spaces. FLAIR signal abnormality in the supratentorial white matter is nonspecific but likely reflects sequela of chronic small vessel ischemic change, unchanged since the prior MRI from 2022. Punctate chronic microhemorrhages in the right frontal lobe are unchanged (10-46). There is no solid mass lesion. There is no mass effect or midline shift. Vascular: Normal flow voids. Skull and upper cervical spine: Normal marrow signal. Sinuses/Orbits: The paranasal sinuses are clear. Bilateral lens implants are in place. The globes and orbits are otherwise unremarkable. Other: None. IMPRESSION: 1. Small acute infarct in the right corona radiata without hemorrhage or mass effect. 2. No other acute intracranial pathology. No acute finding in the pons as questioned on the prior CT. Electronically Signed   By: Valetta Mole M.D.   On: 11/14/2021 19:42   CT Head Wo Contrast  Result Date: 11/14/2021 CLINICAL DATA:  Altered mental status EXAM: CT HEAD WITHOUT CONTRAST TECHNIQUE: Contiguous axial images were obtained from the base of the skull through the vertex without intravenous contrast. RADIATION DOSE REDUCTION: This exam was performed according to the departmental dose-optimization program which includes automated exposure control,  adjustment of the mA and/or kV according to patient size and/or use of iterative reconstruction technique. COMPARISON:  CT head 05/17/2020 FINDINGS: Brain: No evidence of hemorrhage, extra-axial collection or mass lesion/mass effect. There is ventriculomegaly which is likely related to the degree of volume loss. There is a  new focal hypodense lesion in the upper pons (series 2, image 13). Sequela of severe chronic microvascular ischemic change and advanced generalized volume loss. Vascular: No hyperdense vessel or unexpected calcification. Skull: Normal. Negative for fracture or focal lesion. Sinuses/Orbits: Bilateral lens replacement. Other: None. IMPRESSION: New focal hypodense lesion in the ventral pons on the left may be artifactual or represent an age-indeterminate infarct. Recommend MRI for further evaluation. Electronically Signed   By: Marin Roberts M.D.   On: 11/14/2021 16:34    EKG: Independently reviewed.  Sinus tachycardia at 143 bpm.  Significant baseline artifact noted.  Assessment/Plan Principal Problem:   Acute CVA (cerebrovascular accident) [facility]) Active Problems:   Thiamine deficiency   GERD (gastroesophageal reflux disease)   Alcohol abuse   Acute encephalopathy   Encephalopathy > Patient presenting with increased confusion and weakness for the last 4 days per chart review and EDP cooperation with patient's friend. > Has had several years of left significant balance and weakness issues. > He quit drinking 4 to 5 days ago preceding symptoms.  Has known history of prior thiamine deficiency. > Concern for significant thiamine deficiency/Warnicke's, high-dose thiamine started in the ED. > Neurology consulted as below and is recommended additional work-up.  Including rule out infectious etiology given leukocytosis to 14.  Could be a degree of reactive leukocytosis. - Monitor on telemetry - Appreciate neurology recommendations - Follow-up B1, B12, folate, TSH - Continue with  infectious work-up including urine culture and urinalysis - Chest x-ray - Trend fever curve and WBC - EEG - Procalcitonin - Lactic acid  Acute CVA > Work-up in ED was positive for acute infarct in the right corona radiata.  Neurology consulted in the ED. - Admit to telemetry at [place] neurology recommendations - Allow for permissive HTN (systolic < 774 and diastolic < 128)  - ASA 3 81 mg daily  - Echocardiogram  - Carotid doppler and MRA head neck  - A1C  - Lipid panel  - Tele monitoring  - SLP eval - PT/OT  Alcohol use > Recent sensation 4 to 5 days ago.  Not currently in withdrawals.  Concern for thiamine deficiency as above. - Supportive care  GERD - Continue home PPI   DVT prophylaxis: Lovenox Code Status:   Full Family Communication:  None on admission.  Discussed with friend by EDP. Disposition Plan:   Patient is from:  Home  Anticipated DC to:  Home  Anticipated DC date:  1 to 3 days  Anticipated DC barriers: Encephalopathy  Consults called:  Neurology Admission status:  Observation, telemetry  Severity of Illness: The appropriate patient status for this patient is OBSERVATION. Observation status is judged to be reasonable and necessary in order to provide the required intensity of service to ensure the patient's safety. The patient's presenting symptoms, physical exam findings, and initial radiographic and laboratory data in the context of their medical condition is felt to place them at decreased risk for further clinical deterioration. Furthermore, it is anticipated that the patient will be medically stable for discharge from the hospital within 2 midnights of admission.    Marcelyn Bruins MD [facility]  How to contact the [facility] Attending or Consulting provider [place] or covering provider during after hours [place], for this patient?   Check the care team in [facility] and look for a) attending/consulting [facility] provider listed and b) the [facility]  team listed Log into [email or web address] and use [place] universal password to access. If  you do not have the password, please contact the hospital operator. Locate the [facility] provider you are looking for under [facility] and page to a number that you can be directly reached. If you still have difficulty reaching the provider, please page the [facility] (Director on Call) for the Hospitalists listed on amion for assistance.  11/14/2021, 10:47 PM

## 2021-11-14 NOTE — ED Triage Notes (Signed)
BIB EMS from home due to Pain all over that moves. He has tried to stop drinking, last drink 4 days ago. ? UTI urinary incont during the night. 110-CBG 102-152/98-95% RA

## 2021-11-15 ENCOUNTER — Observation Stay (HOSPITAL_COMMUNITY): Payer: Medicare Other

## 2021-11-15 ENCOUNTER — Observation Stay (HOSPITAL_BASED_OUTPATIENT_CLINIC_OR_DEPARTMENT_OTHER)
Admission: EM | Admit: 2021-11-15 | Discharge: 2021-11-15 | Disposition: A | Discharge status: Home / Self Care | Payer: Medicare Other | Source: Home / Self Care | Attending: Internal Medicine | Admitting: Internal Medicine

## 2021-11-15 ENCOUNTER — Other Ambulatory Visit: Payer: Self-pay

## 2021-11-15 DIAGNOSIS — I10 Essential (primary) hypertension: Secondary | ICD-10-CM | POA: Diagnosis not present

## 2021-11-15 DIAGNOSIS — G4733 Obstructive sleep apnea (adult) (pediatric): Secondary | ICD-10-CM | POA: Diagnosis not present

## 2021-11-15 DIAGNOSIS — I6389 Other cerebral infarction: Secondary | ICD-10-CM | POA: Diagnosis not present

## 2021-11-15 DIAGNOSIS — E8809 Other disorders of plasma-protein metabolism, not elsewhere classified: Secondary | ICD-10-CM | POA: Diagnosis not present

## 2021-11-15 DIAGNOSIS — R0902 Hypoxemia: Secondary | ICD-10-CM | POA: Diagnosis not present

## 2021-11-15 DIAGNOSIS — R4182 Altered mental status, unspecified: Secondary | ICD-10-CM | POA: Diagnosis not present

## 2021-11-15 DIAGNOSIS — G9340 Encephalopathy, unspecified: Secondary | ICD-10-CM | POA: Diagnosis not present

## 2021-11-15 DIAGNOSIS — I6381 Other cerebral infarction due to occlusion or stenosis of small artery: Secondary | ICD-10-CM | POA: Diagnosis not present

## 2021-11-15 DIAGNOSIS — E871 Hypo-osmolality and hyponatremia: Secondary | ICD-10-CM | POA: Diagnosis not present

## 2021-11-15 DIAGNOSIS — F1090 Alcohol use, unspecified, uncomplicated: Secondary | ICD-10-CM | POA: Diagnosis not present

## 2021-11-15 DIAGNOSIS — Z8249 Family history of ischemic heart disease and other diseases of the circulatory system: Secondary | ICD-10-CM | POA: Diagnosis not present

## 2021-11-15 DIAGNOSIS — Z79899 Other long term (current) drug therapy: Secondary | ICD-10-CM | POA: Diagnosis not present

## 2021-11-15 DIAGNOSIS — R270 Ataxia, unspecified: Secondary | ICD-10-CM | POA: Diagnosis not present

## 2021-11-15 DIAGNOSIS — E876 Hypokalemia: Secondary | ICD-10-CM | POA: Diagnosis not present

## 2021-11-15 DIAGNOSIS — E785 Hyperlipidemia, unspecified: Secondary | ICD-10-CM | POA: Diagnosis not present

## 2021-11-15 DIAGNOSIS — E512 Wernicke's encephalopathy: Secondary | ICD-10-CM | POA: Diagnosis not present

## 2021-11-15 DIAGNOSIS — F1010 Alcohol abuse, uncomplicated: Secondary | ICD-10-CM | POA: Diagnosis not present

## 2021-11-15 DIAGNOSIS — K589 Irritable bowel syndrome without diarrhea: Secondary | ICD-10-CM | POA: Diagnosis not present

## 2021-11-15 DIAGNOSIS — Z87891 Personal history of nicotine dependence: Secondary | ICD-10-CM | POA: Diagnosis not present

## 2021-11-15 DIAGNOSIS — M1990 Unspecified osteoarthritis, unspecified site: Secondary | ICD-10-CM | POA: Diagnosis not present

## 2021-11-15 DIAGNOSIS — I639 Cerebral infarction, unspecified: Secondary | ICD-10-CM | POA: Diagnosis present

## 2021-11-15 DIAGNOSIS — E872 Acidosis, unspecified: Secondary | ICD-10-CM | POA: Diagnosis not present

## 2021-11-15 DIAGNOSIS — K219 Gastro-esophageal reflux disease without esophagitis: Secondary | ICD-10-CM | POA: Diagnosis not present

## 2021-11-15 DIAGNOSIS — M109 Gout, unspecified: Secondary | ICD-10-CM | POA: Diagnosis not present

## 2021-11-15 LAB — COMPREHENSIVE METABOLIC PANEL
ALT: 14 U/L (ref 0–44)
AST: 21 U/L (ref 15–41)
Albumin: 3.2 g/dL — ABNORMAL LOW (ref 3.5–5.0)
Alkaline Phosphatase: 55 U/L (ref 38–126)
Anion gap: 12 (ref 5–15)
BUN: 18 mg/dL (ref 8–23)
CO2: 20 mmol/L — ABNORMAL LOW (ref 22–32)
Calcium: 9.2 mg/dL (ref 8.9–10.3)
Chloride: 103 mmol/L (ref 98–111)
Creatinine, Ser: 0.79 mg/dL (ref 0.61–1.24)
GFR, Estimated: >60 mL/min (ref >60)
Glucose, Bld: 90 mg/dL (ref 70–99)
Potassium: 3.7 mmol/L (ref 3.5–5.1)
Sodium: 135 mmol/L (ref 135–145)
Total Bilirubin: 1.2 mg/dL (ref 0.3–1.2)
Total Protein: 6.8 g/dL (ref 6.5–8.1)

## 2021-11-15 LAB — CBC WITH DIFFERENTIAL/PLATELET
Abs Immature Granulocytes: 0.09 10*3/uL — ABNORMAL HIGH (ref 0.00–0.07)
Basophils Absolute: 0 10*3/uL (ref 0.0–0.1)
Basophils Relative: 0 %
Eosinophils Absolute: 0 10*3/uL (ref 0.0–0.5)
Eosinophils Relative: 0 %
HCT: 43.9 % (ref 39.0–52.0)
Hemoglobin: 14.5 g/dL (ref 13.0–17.0)
Immature Granulocytes: 1 %
Lymphocytes Relative: 9 %
Lymphs Abs: 1 10*3/uL (ref 0.7–4.0)
MCH: 30.6 pg (ref 26.0–34.0)
MCHC: 33 g/dL (ref 30.0–36.0)
MCV: 92.6 fL (ref 80.0–100.0)
Monocytes Absolute: 2.3 10*3/uL — ABNORMAL HIGH (ref 0.1–1.0)
Monocytes Relative: 21 %
Neutro Abs: 7.5 10*3/uL (ref 1.7–7.7)
Neutrophils Relative %: 69 %
Platelets: 295 10*3/uL (ref 150–400)
RBC: 4.74 MIL/uL (ref 4.22–5.81)
RDW: 12.8 % (ref 11.5–15.5)
WBC: 10.8 10*3/uL — ABNORMAL HIGH (ref 4.0–10.5)
nRBC: 0 % (ref 0.0–0.2)

## 2021-11-15 LAB — PROCALCITONIN: Procalcitonin: 0.65 ng/mL

## 2021-11-15 LAB — LIPID PANEL
Cholesterol: 126 mg/dL (ref 0–200)
HDL: 43 mg/dL (ref >40)
LDL Cholesterol: 71 mg/dL (ref 0–99)
Total CHOL/HDL Ratio: 2.9 RATIO
Triglycerides: 60 mg/dL (ref <150)
VLDL: 12 mg/dL (ref 0–40)

## 2021-11-15 LAB — VITAMIN B12: Vitamin B-12: 923 pg/mL — ABNORMAL HIGH (ref 180–914)

## 2021-11-15 LAB — ECHOCARDIOGRAM COMPLETE
Calc EF: 57.9 %
Height: 64 in
S' Lateral: 2.3 cm
Single Plane A2C EF: 60 %
Single Plane A4C EF: 60.3 %
Weight: 2511.66 oz

## 2021-11-15 LAB — MAGNESIUM: Magnesium: 2 mg/dL (ref 1.7–2.4)

## 2021-11-15 LAB — TSH: TSH: 2.504 u[IU]/mL (ref 0.350–4.500)

## 2021-11-15 LAB — HEMOGLOBIN A1C
Hgb A1c MFr Bld: 5.3 % (ref 4.8–5.6)
Mean Plasma Glucose: 105.41 mg/dL

## 2021-11-15 LAB — PHOSPHORUS: Phosphorus: 3.4 mg/dL (ref 2.5–4.6)

## 2021-11-15 LAB — FOLATE: Folate: 17.6 ng/mL (ref >5.9)

## 2021-11-15 MED ORDER — THIAMINE HCL 100 MG/ML IJ SOLN
250.0000 mg | Freq: Every day | INTRAVENOUS | Status: DC
  Administered 2021-11-17: 250 mg via INTRAVENOUS
  Filled 2021-11-15: qty 2.5

## 2021-11-15 MED ORDER — THIAMINE HCL 100 MG/ML IJ SOLN: 100.0000 mg | Freq: Every day | INTRAMUSCULAR | Status: DC

## 2021-11-15 MED ORDER — ATORVASTATIN CALCIUM 40 MG PO TABS
40.0000 mg | ORAL_TABLET | Freq: Every day | ORAL | Status: DC
  Administered 2021-11-15 – 2021-11-20 (×6): 40 mg via ORAL
  Filled 2021-11-15 (×6): qty 1

## 2021-11-15 MED ORDER — SODIUM CHLORIDE 0.9 % IV SOLN: INTRAVENOUS | Status: DC

## 2021-11-15 MED ORDER — SODIUM CHLORIDE 0.9 % IV BOLUS
500.0000 mL | Freq: Once | INTRAVENOUS | Status: AC
  Administered 2021-11-15: 500 mL via INTRAVENOUS

## 2021-11-15 MED ORDER — THIAMINE HCL 100 MG/ML IJ SOLN
500.0000 mg | Freq: Three times a day (TID) | INTRAVENOUS | Status: AC
  Administered 2021-11-16 – 2021-11-17 (×4): 500 mg via INTRAVENOUS
  Filled 2021-11-15 (×4): qty 5

## 2021-11-15 NOTE — Evaluation (Signed)
Occupational Therapy Evaluation Patient Details Name: Jose [name] MRN: [id number] DOB: 1935-09-07 Today's Date: 11/15/2021   History of Present Illness Jose [name] is an 86 yo male presenting with his S.O. with significant increase in his confusion and weakness and loss of balance in the last 4 to 5 days. Has been on and off ETOH use. MRI Brain demonstrated a small punctate R corona radiata stroke.with PMH: Chronic sensorimotor axonal polyneuropathy, ongoing alcohol abuse, thiamine deficiency, arthritis, HLD, IBS, sleep apnea, spinal stenosis, chronic fatigue and ataxia.   Clinical Impression   Patient is currently requiring assistance with ADLs including up total assist with Lower body ADLs, up to moderate assist with Upper body ADLs,  as well as  Max assist of 2 with bed mobility and moderate assist of 2 with functional transfers to toilet.  Current level of function is below patient's typical baseline.  During this evaluation, patient was limited by generalized weakness, impaired activity tolerance, and significant pain to RUE, Bilateral knees, and diffuse body pain with all mobility and touch, all of which has the potential to impact patient's safety and independence during functional mobility, as well as performance for ADLs.  Patient lives alone at home with a girlfriend nearby,  who is able to provide PRN supervision and assistance.  Patient demonstrates good rehab potential, and should benefit from continued skilled occupational therapy services while in acute care to maximize safety, independence and quality of life at home.  Continued occupational therapy services in a AIR setting prior to return home is recommended.  ?       Recommendations for follow up therapy are one component of a multi-disciplinary discharge planning process, led by the attending physician.  Recommendations may be updated based on patient status, additional functional criteria and insurance  authorization.   Follow Up Recommendations  Acute inpatient rehab (3hours/day)    Assistance Recommended at Discharge Frequent or constant Supervision/Assistance  Patient can return home with the following Two people to help with walking and/or transfers;A lot of help with bathing/dressing/bathroom;Assist for transportation;Assistance with cooking/housework;Help with stairs or ramp for entrance;Direct supervision/assist for financial management;Direct supervision/assist for medications management    Functional Status Assessment  Patient has had a recent decline in their functional status and demonstrates the ability to make significant improvements in function in a reasonable and predictable amount of time.  Equipment Recommendations   (Will defer to post-acute recommendations)    Recommendations for Other Services Rehab consult;Speech consult     Precautions / Restrictions Precautions Precautions: Fall Precaution Comments: RT shoulder/elbow pain Restrictions Weight Bearing Restrictions: No      Mobility Bed Mobility Overal bed mobility: Needs Assistance Bed Mobility: Supine to Sit, Sit to Supine     Supine to sit: Max assist, +2 for physical assistance Sit to supine: Total assist, +2 for physical assistance        Transfers                          Balance Overall balance assessment: Needs assistance Sitting-balance support: Feet supported, Bilateral upper extremity supported Sitting balance-Leahy Scale: Poor     Standing balance support: During functional activity, Bilateral upper extremity supported Standing balance-Leahy Scale: Poor                             ADL either performed or assessed with clinical judgement   ADL Overall ADL's : Needs assistance/impaired Eating/Feeding:  Bed level;Set up   Grooming: Wash/dry face;Bed level;Set up   Upper Body Bathing: Minimal assistance;Bed level   Lower Body Bathing: +2 for physical  assistance;Maximal assistance;Bed level   Upper Body Dressing : Moderate assistance;Bed level   Lower Body Dressing: Total assistance;Bed level   Toilet Transfer: Moderate assistance;+2 for physical assistance;Cueing for safety;Cueing for sequencing;Maximal assistance Toilet Transfer Details (indicate cue type and reason): Pt required feet blocked and stood from elevated edge of stretcher to hand held assistance with Max assist of 2, 1st stand, then Moderate assist of 2 for 2nd stand. Toileting- Clothing Manipulation and Hygiene: Total assistance;Bed level Toileting - Clothing Manipulation Details (indicate cue type and reason): Incontinent of bowel at bed level. Boxers removed for skin integrity.     Functional mobility during ADLs: Moderate assistance;Maximal assistance;+2 for physical assistance       Vision   Vision Assessment?: No apparent visual deficits     Perception     Praxis      Pertinent Vitals/Pain Pain Assessment Pain Assessment: Faces Pain Location: RT shoulder, RT elbow, Bilateral knees. Pt also with hightened pain response with any light touch of movement.  Pt unable to provide 0-10 score Pain Descriptors / Indicators: Grimacing, Guarding, Moaning Pain Intervention(s): Limited activity within patient's tolerance, Monitored during session, Repositioned     Hand Dominance Right (but tends to use LT hand more often due to RUE pain)   Extremity/Trunk Assessment Upper Extremity Assessment Upper Extremity Assessment: RUE deficits/detail;LUE deficits/detail RUE Deficits / Details: RT shoulder pain. and a "bad elbow"  Tried to demonstrate ROM but unable to tolerate stating "I don't want to do this." Seeing Dr. Veverly Fells for shoulder. RUE Sensation: WNL RUE Coordination: WNL (but no movement shoulder or elbow) LUE Deficits / Details: WFL LUE Sensation: WNL LUE Coordination: WNL   Lower Extremity Assessment Lower Extremity Assessment: Defer to PT evaluation        Communication Communication Communication: No difficulties ("dry mouth")   Cognition Arousal/Alertness: Awake/alert Behavior During Therapy: WFL for tasks assessed/performed Overall Cognitive Status: Impaired/Different from baseline Area of Impairment: Following commands, Problem solving, Safety/judgement, Awareness, Orientation                 Orientation Level: Situation, Disoriented to     Following Commands: Follows one step commands inconsistently, Follows one step commands with increased time Safety/Judgement: Decreased awareness of deficits Awareness: Emergent Problem Solving: Slow processing, Difficulty sequencing, Requires verbal cues       General Comments       Exercises     Shoulder Instructions      Home Living Family/patient expects to be discharged to:: Private residence Living Arrangements: Alone Available Help at Discharge: Other (Comment) (Significant other, [name] who lives nearby) Type of Home: House Home Access: Stairs to enter [employer] of Steps: 1 (threshold)   Home Layout: Two level Alternate Level Stairs-Number of Steps: Laundry and computer in basement. Pt likes to access but necessary.   Bathroom Shower/Tub: Tub/shower unit (Also with walk in shower but pt prefers tub)   Bathroom Toilet: Standard     Home Equipment: Adaptive equipment;Rolling Walker (2 wheels);Cane - single point Adaptive Equipment: Long-handled sponge        Prior Functioning/Environment Prior Level of Function : Independent/Modified Independent;Driving;Working/employed       Physical Assist : ADLs (physical)   ADLs (physical): IADLs Mobility Comments: Recently going to gym 5 days a week, now down to 2x/wk. ADLs Comments: Has housekeeper every other week. 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"        OT Problem List: Cardiopulmonary status limiting activity;Pain;Decreased strength;Decreased range of motion;Decreased cognition;Decreased activity  tolerance;Decreased safety awareness;Impaired balance (sitting and/or standing);Impaired UE functional use      OT Treatment/Interventions: Self-care/ADL training;Therapeutic activities;Cognitive remediation/compensation;Neuromuscular education;DME and/or AE instruction;Patient/family education;Balance training    OT Goals(Current goals can be found in the care plan section) Acute Rehab OT Goals Patient Stated Goal: Per Signifiacnt other for pt to return to his high level of functioning and indepedence. OT Goal Formulation: With family Time For Goal Achievement: 11/29/21 Potential to Achieve Goals: Good ADL Goals Pt Will Perform Upper Body Bathing: with modified independence;sitting Pt Will Perform Lower Body Bathing: with modified independence;sitting/lateral leans;sit to/from stand;with adaptive equipment Pt Will Perform Upper Body Dressing: with modified independence;with adaptive equipment;sitting Pt Will Perform Lower Body Dressing: with modified independence;with adaptive equipment;sitting/lateral leans;sit to/from stand Pt Will Transfer to Toilet: with modified independence;ambulating Pt Will Perform Toileting - Clothing Manipulation and hygiene: with modified independence;with adaptive equipment;sitting/lateral leans;sit to/from stand Additional ADL Goal #1: Pt will demonstrate improved mentation by scoring <4/10 on short blessed test and answering 4/4 safety questions from the [facility] correctly:   1. What do you do for yourself if you are sick with a cold.    2. What do you do if you burn yourself and the wound becomes infected.    3. What do you do if you experience severe chest pain and shortness of breath?   4. What number do you call in an emergency?  OT Frequency: Min 2X/week    Co-evaluation              AM-PAC OT "6 Clicks" Daily Activity     Outcome Measure Help from another person eating meals?: A Little Help from another person taking care of personal grooming?: A  Little Help from another person toileting, which includes using toliet, bedpan, or urinal?: Total Help from another person bathing (including washing, rinsing, drying)?: A Lot Help from another person to put on and taking off regular upper body clothing?: A Lot Help from another person to put on and taking off regular lower body clothing?: Total 6 Click Score: 12   End of Session Equipment Utilized During Treatment: Gait belt Nurse Communication: Mobility status;Other (comment) (RN approved OT to see.)  Activity Tolerance: Patient limited by pain Patient left: in bed;with family/visitor present  OT Visit Diagnosis: Unsteadiness on feet (R26.81);Other symptoms and signs involving cognitive function;Pain;Muscle weakness (generalized) (M62.81) Pain - Right/Left: Right Pain - part of body: Shoulder;Arm (back)                Time: 4268-3419 OT Time Calculation (min): 40 min Charges:  OT General Charges $OT Visit: 1 Visit OT Evaluation $OT Eval Low Complexity: 1 Low OT Treatments $Self Care/Home Management : 8-22 mins  Anderson Malta, OT Acute Rehab Services Office: [phone] 11/15/2021  Julien Girt 11/15/2021, 1:56 PM

## 2021-11-15 NOTE — Progress Notes (Incomplete)
Carotid duplex bilateral attempted. Patient eating. Will attempt again as schedule and patient availability permits.   Darlin Coco, RDMS, RVT

## 2021-11-15 NOTE — Progress Notes (Signed)
PROGRESS NOTE    Jose [name]  IAX:[id number] DOB: July 22, 1935 DOA: 11/14/2021 PCP: Hoyt Koch, MD   Brief Narrative:  HPI per Dr. Neva Seat on 11/14/21 thiamine deficiency, ataxia presenting with pain.   Initially presented for ongoing generalized pain.  With additional information from friend EDP was able to determine the patient had stopped drinking 4 to 5 days ago.  Also that he has years of issues with his balance and weakness however he has had significant increase in his confusion and weakness in the last 4 to 5 days (patient initially stated he did not know when he quit in the ED, on my exam he states that his girlfriend is making it up and that it was 4 to 5 weeks ago).   Denies fevers, chills, chest pain, shortness breath, Donnell pain, constipation, diarrhea, nausea, vomiting.   ED Course: Vital signs in the ED significant for heart rate in the 40s to 120s, blood pressure in the 078M to 754 systolic, respiratory rate in the 20s to 30s.  Episode of hypoxia documented however this has not been persistent.  Lab work-up included CMP with glucose 126, T. bili 1.3.  CBC with leukocytosis to 14.7.  Lipase normal.  Urinalysis pending.  Ethanol level negative.  Ammonia level negative.  Pending labs include B1, B12, folate, TSH.  CT head showed new focal hypodense lesion.  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  Neurology was consulted and recommended stroke work-up as well as administration of thiamine and further encephalopathy work-up.  Patient received thiamine and IV fluids in the ED.  **Interim History Patient underwent stroke work-up and was transferred to [facility] for further evaluation and recommendation by the stroke team.  PT OT evaluated and recommending acute inpatient rehabilitation.  EEG was done and showed mild diffuse encephalopathy which is nonspecific of etiology and no seizure or epileptiform discharges were seen throughout the  recording    Assessment and Plan: No notes have been filed under this hospital service. Service: Hospitalist  Acute encephalopathy > Patient presenting with increased confusion and weakness for the last 4 days per chart review and EDP cooperation with patient's friend. > Has had several years of left significant balance and weakness issues. > He quit drinking 4 to 5 days ago preceding symptoms.  Has known history of prior thiamine deficiency. > Concern for significant thiamine deficiency/Wernicke's, high-dose thiamine started in the ED and being continued as delineated at 500 mg every 8 hours for 4 doses and then to 50 mg for 6 doses and then 100 mg daily > Neurology consulted as below and is recommended additional work-up.  Including rule out infectious etiology given leukocytosis to 14.  Could be a degree of reactive leukocytosis and is now downtrending and monitoring off of antibiotics -Continue to monitor on telemetry - Appreciate neurology recommendations - Follow-up B1 which is pending, B12 was 923, folate 17.6, TSH was 2.504 - Continue with infectious work-up including urine culture and urinalysis; urine cultures pending urinalysis showed hazy appearance with amber-colored urine, negative leukocytes, negative nitrites, 1.02 specific gravity - Chest x-ray - Trend fever curve and WBC and WBC is trending down and went from 14.7 now 10.8 -Give him a 500 mL bolus - EEG done and showed that the study was suggestive of mild diffuse encephalopathy with nonspecific etiology with no seizures or epileptiform discharges seen throughout the recording - Procalcitonin level was 0.65 - Lactic acid was normal at 1.2   Acute  CVA > Work-up in ED was positive for acute infarct in the right corona radiata.  Neurology consulted in the ED. - Admit to telemetry at [facility] and patient is being transferred there today - Appreciate neurology recommendations - Allow for permissive HTN (systolic < 426 and  diastolic < 834)  - ASA 81 mg p.o. daily recommended by neurology currently -Head CT scan done and showed "New focal hypodense lesion in the ventral pons on the left may be artifactual or represent an age-indeterminate infarct. Recommend MRI for further evaluation." - Echocardiogram showed normal EF of 65 to 70% with indeterminate diastolic filling pressures due to EA gradient and showed "No intracardiac source of embolism detected on this transthoracic study. Consider a transesophageal  echocardiogram to exclude cardiac source of embolism if clinically  indicated." -MRI of the Brain showed "Small acute infarct in the right corona radiata without hemorrhage or mass effect. No other acute intracranial pathology. No acute finding in the pons as questioned on the prior CT" -MRA of the head and neck done and showed a normal MRA of the neck and normal intracranial MRA - A1C was 5.3 - Lipid panel done and showed a total cholesterol/HDL ratio 2.9, cholesterol level 126, HDL 43, LDL 71, triglycerides of 60, VLDL 12 -We will start atorvastatin 40 mg p.o. daily -Continuing tele monitoring  - SLP eval - PT/OT recommending CIR -Further care per neuro stroke team  Metabolic acidosis -Mild -Patient CO2 is now 20, anion gap is 12, chloride level is 103 -We will give gentle IV fluid hydration with normal saline at 75 mils per hour -Continue to monitor and trend and repeat CMP in a.m.  Hypoalbuminemia -Patient's albumin level is now 3.2 -Continue monitor and trend and repeat CMP in the a.m.   Alcohol use > Recent cessation 4 to 5 days ago.  Not currently in withdrawals.  Concern for thiamine deficiency as above. - Supportive care   GERD/GI prophylaxis - Continue home PPI with pantoprazole substitution of 40 minutes p.o. daily  DVT prophylaxis: enoxaparin (LOVENOX) injection 40 mg Start: 11/14/21 2200    Code Status: Full Code Family Communication: No family currently at bedside  Disposition Plan:   Level of care: Telemetry Medical Status is: Observation The patient will require care spanning > 2 midnights and should be moved to inpatient because: He has been transferred to [facility] and will need inpatient rehabilitation   Consultants:  Neurology  Procedures:  Delineated as above  Antimicrobials:  Anti-infectives (From admission, onward)    None       Subjective: Seen and examined at bedside and he was doing okay and just did not feel well.  No nausea or vomiting.  He is a little tachycardic.  Understand he is going to [facility].  No other concerns or complaints at this time.  Objective: Vitals:   11/15/21 1333 11/15/21 1532 11/15/21 1600 11/15/21 1832  BP:   (!) 144/95 (!) 146/83  Pulse: (!) 120  (!) 115 85  Resp:   (!) 21 18  Temp:  97.9 F (36.6 C)  99.3 F (37.4 C)  TempSrc:    Oral  SpO2:   95% 93%  Weight:      Height:        Intake/Output Summary (Last 24 hours) at 11/15/2021 2017 Last data filed at 11/15/2021 1859 Gross per 24 hour  Intake 712.1 ml  Output --  Net 712.1 ml   Filed Weights   11/14/21 1134  Weight:  71.2 kg   Examination: Physical Exam:  Constitutional: Well-nourished, well-developed elderly overweight Caucasian male in no acute distress appears, slightly uncomfortable Respiratory: Diminished to auscultation bilaterally, no wheezing, rales, rhonchi or crackles. Normal respiratory effort and patient is not tachypenic. No accessory muscle use.  Cardiovascular: Tachycardic rate but regular rhythm, no murmurs / rubs / gallops. S1 and S2 auscultated. No extremity edema.  Abdomen: Soft, non-tender, distended secondary body habitus. Bowel sounds positive.  GU: Deferred. Musculoskeletal: No clubbing / cyanosis of digits/nails. No joint deformity upper and lower extremities.  Skin: No rashes, lesions, ulcers on limited skin evaluation. No induration; Warm and dry.  Neurologic: CN 2-12 grossly intact with no focal deficits. Romberg sign  and cerebellar reflexes not assessed.  Psychiatric: Normal judgment and insight. Alert and oriented x 3. Normal mood and appropriate affect.   Data Reviewed: I have personally reviewed following labs and imaging studies  CBC: Recent Labs  Lab 11/14/21 1345 2021/12/04 0945  WBC 14.7* 10.8*  NEUTROABS 10.4* 7.5  HGB 15.9 14.5  HCT 48.8 43.9  MCV 94.4 92.6  PLT 337 621   Basic Metabolic Panel: Recent Labs  Lab 11/14/21 1345 December 04, 2021 0945  NA 135 135  K 4.1 3.7  CL 99 103  CO2 22 20*  GLUCOSE 126* 90  BUN 16 18  CREATININE 1.04 0.79  CALCIUM 9.6 9.2  MG  --  2.0  PHOS  --  3.4   GFR: Estimated Creatinine Clearance: 62.2 mL/min (by C-G formula based on SCr of 0.79 mg/dL). Liver Function Tests: Recent Labs  Lab 11/14/21 1345 12/04/21 0945  AST 21 21  ALT 15 14  ALKPHOS 63 55  BILITOT 1.3* 1.2  PROT 7.4 6.8  ALBUMIN 3.7 3.2*   Recent Labs  Lab 11/14/21 1345  LIPASE 39   Recent Labs  Lab 11/14/21 1345  AMMONIA 13   Coagulation Profile: No results for input(s): "INR", "PROTIME" in the last 168 hours. Cardiac Enzymes: No results for input(s): "CKTOTAL", "CKMB", "CKMBINDEX", "TROPONINI" in the last 168 hours. BNP (last 3 results) No results for input(s): "PROBNP" in the last 8760 hours. HbA1C: Recent Labs    December 04, 2021 0522  HGBA1C 5.3   CBG: No results for input(s): "GLUCAP" in the last 168 hours. Lipid Profile: Recent Labs    04-Dec-2021 0522  CHOL 126  HDL 43  LDLCALC 71  TRIG 60  CHOLHDL 2.9   Thyroid Function Tests: Recent Labs    11/14/21 2232  TSH 2.504   Anemia Panel: Recent Labs    11/14/21 2232  VITAMINB12 923*  FOLATE 17.6   Sepsis Labs: Recent Labs  Lab 11/14/21 2232  PROCALCITON 0.65  LATICACIDVEN 1.2    No results found for this or any previous visit (from the past 240 hour(s)).   Radiology Studies: EEG adult  Result Date: 12-04-2021 Lora Havens, MD     Dec 04, 2021  6:04 PM Patient Name: Jose [name]  MRN: [id number] Epilepsy Attending: Lora Havens Referring Physician/Provider: Marcelyn Bruins, MD Date: 12-04-2021 Duration: 25.25 mins Patient history: 86yo M with ams. EEG to evaluate for seizure Level of alertness: Awake AEDs during EEG study: None Technical aspects: This EEG study was done with scalp electrodes positioned according to the 10-20 International system of electrode placement. Electrical activity was reviewed with band pass filter of 1-'70Hz'$ , sensitivity of 7 uV/mm, display speed of 27m/sec with a '60Hz'$  notched filter applied as appropriate. EEG data were recorded continuously and digitally stored.  Video monitoring  was available and reviewed as appropriate. Description: The posterior dominant rhythm consists of 9 Hz activity of moderate voltage (25-35 uV) seen predominantly in posterior head regions, symmetric and reactive to eye opening and eye closing. EEG showed intermittent generalized 3 to 6 Hz theta-delta slowing. Hyperventilation and photic stimulation were not performed.   ABNORMALITY - Intermittent slow, generalized IMPRESSION: This study is suggestive of mild diffuse encephalopathy, nonspecific etiology. No seizures or epileptiform discharges were seen throughout the recording. Lora Havens   ECHOCARDIOGRAM COMPLETE  Result Date: 11/15/2021    ECHOCARDIOGRAM REPORT   Patient Name:   Jose [name] Date of Exam: 11/15/2021 Medical Rec #:  [id number]             Height:       64.0 in Accession #:    [id number]            Weight:       157.0 lb Date of Birth:  October 26, 1935              BSA:          1.765 m Patient Age:    86 years              BP:           143/89 mmHg Patient Gender: M                     HR:           120 bpm. Exam Location:  Inpatient Procedure: 2D Echo, Cardiac Doppler and Color Doppler Indications:    Stroke I63.9  History:        Patient has no prior history of Echocardiogram examinations.  Sonographer:    Bernadene Person RDCS Referring Phys: [id number]  Casey  1. Left ventricular ejection fraction, by estimation, is 65 to 70%. The left ventricle has normal function. The left ventricle has no regional wall motion abnormalities. Indeterminate diastolic filling due to E-A fusion.  2. Right ventricular systolic function is normal. The right ventricular size is normal. There is normal pulmonary artery systolic pressure. The estimated right ventricular systolic pressure is 10.2 mmHg.  3. The mitral valve is grossly normal. Trivial mitral valve regurgitation. No evidence of mitral stenosis.  4. The aortic valve is tricuspid. There is mild calcification of the aortic valve. There is mild thickening of the aortic valve. Aortic valve regurgitation is not visualized. Aortic valve sclerosis is present, with no evidence of aortic valve stenosis.  5. The inferior vena cava is normal in size with greater than 50% respiratory variability, suggesting right atrial pressure of 3 mmHg. Conclusion(s)/Recommendation(s): No intracardiac source of embolism detected on this transthoracic study. Consider a transesophageal echocardiogram to exclude cardiac source of embolism if clinically indicated. FINDINGS  Left Ventricle: Left ventricular ejection fraction, by estimation, is 65 to 70%. The left ventricle has normal function. The left ventricle has no regional wall motion abnormalities. The left ventricular internal cavity size was normal in size. There is  no left ventricular hypertrophy. Indeterminate diastolic filling due to E-A fusion. Right Ventricle: The right ventricular size is normal. No increase in right ventricular wall thickness. Right ventricular systolic function is normal. There is normal pulmonary artery systolic pressure. The tricuspid regurgitant velocity is 2.31 m/s, and  with an assumed right atrial pressure of 3 mmHg, the estimated right ventricular systolic pressure is 72.5 mmHg. Left Atrium: Left atrial size was normal in size. Right Atrium:  Right atrial size was normal in size. Pericardium: There is no evidence of pericardial effusion. Mitral Valve: The mitral valve is grossly normal. Trivial mitral valve regurgitation. No evidence of mitral valve stenosis. Tricuspid Valve: The tricuspid valve is grossly normal. Tricuspid valve regurgitation is mild . No evidence of tricuspid stenosis. Aortic Valve: The aortic valve is tricuspid. There is mild calcification of the aortic valve. There is mild thickening of the aortic valve. Aortic valve regurgitation is not visualized. Aortic valve sclerosis is present, with no evidence of aortic valve stenosis. Pulmonic Valve: The pulmonic valve was grossly normal. Pulmonic valve regurgitation is trivial. No evidence of pulmonic stenosis. Aorta: The aortic root and ascending aorta are structurally normal, with no evidence of dilitation. Venous: The inferior vena cava is normal in size with greater than 50% respiratory variability, suggesting right atrial pressure of 3 mmHg. IAS/Shunts: The atrial septum is grossly normal.  LEFT VENTRICLE PLAX 2D LVIDd:         3.70 cm LVIDs:         2.30 cm LV PW:         0.90 cm LV IVS:        1.10 cm LVOT diam:     2.00 cm LV SV:         26 LV SV Index:   15 LVOT Area:     3.14 cm  LV Volumes (MOD) LV vol d, MOD A2C: 57.8 ml LV vol d, MOD A4C: 70.0 ml LV vol s, MOD A2C: 23.1 ml LV vol s, MOD A4C: 27.8 ml LV SV MOD A2C:     34.7 ml LV SV MOD A4C:     70.0 ml LV SV MOD BP:      36.6 ml RIGHT VENTRICLE RV S prime:     13.20 cm/s TAPSE (M-mode): 2.3 cm LEFT ATRIUM             Index        RIGHT ATRIUM           Index LA diam:        2.40 cm 1.36 cm/m   RA Area:     13.40 cm LA Vol (A2C):   19.4 ml 10.99 ml/m  RA Volume:   28.30 ml  16.03 ml/m LA Vol (A4C):   24.8 ml 14.05 ml/m LA Biplane Vol: 24.1 ml 13.65 ml/m  AORTIC VALVE LVOT Vmax:   69.47 cm/s LVOT Vmean:  44.500 cm/s LVOT VTI:    0.084 m  AORTA Ao Root diam: 3.50 cm Ao Asc diam:  3.50 cm TRICUSPID VALVE TR Peak grad:   21.3  mmHg TR Vmax:        231.00 cm/s  SHUNTS Systemic VTI:  0.08 m Systemic Diam: 2.00 cm Eleonore Chiquito MD Electronically signed by Eleonore Chiquito MD Signature Date/Time: 11/15/2021/9:28:56 AM    Final    MR MRA NECK W CONTRAST  Result Date: 11/15/2021 CLINICAL DATA:  Stroke follow-up EXAM: MRA NECK WITHOUT AND WITH CONTRAST TECHNIQUE: Multiplanar and multiecho pulse sequences of the neck were obtained without and with intravenous contrast. Angiographic images of the neck were obtained using MRA technique without and with intravenous contrast. CONTRAST:  6m GADAVIST GADOBUTROL 1 MMOL/ML IV SOLN COMPARISON:  None Available. FINDINGS: Aortic arch: Normal 3 vessel branching pattern. Right carotid system: No occlusion or stenosis. Left carotid system: No occlusion or stenosis. Vertebral arteries: Codominant system. No occlusion or stenosis. Both vertebral arteries originate from the subclavian arteries. Normal configuration  of the vertebrobasilar confluence. IMPRESSION: Normal MRA neck Electronically Signed   By: Ulyses Jarred M.D.   On: 11/15/2021 00:54   DG CHEST PORT 1 VIEW  Result Date: 11/15/2021 CLINICAL DATA:  Encephalopathy. EXAM: PORTABLE CHEST 1 VIEW COMPARISON:  Chest radiograph dated 05/17/2020. FINDINGS: There is left lung base atelectasis or infiltrate. Bilateral perihilar densities may represent mild congestion. No pleural effusion pneumothorax. The cardiac silhouette is within limits. Atherosclerotic calcification of the aorta. No acute osseous pathology. IMPRESSION: Left lung base atelectasis or infiltrate. Electronically Signed   By: Anner Crete M.D.   On: 11/15/2021 00:02   MR ANGIO HEAD WO CONTRAST  Result Date: 11/14/2021 CLINICAL DATA:  Acute neurologic deficit EXAM: MRA HEAD WITHOUT CONTRAST TECHNIQUE: Angiographic images of the Circle of Willis were acquired using MRA technique without intravenous contrast. COMPARISON:  None Available. FINDINGS: POSTERIOR CIRCULATION: --Vertebral  arteries: Normal --Inferior cerebellar arteries: Normal. --Basilar artery: Normal. --Superior cerebellar arteries: Normal. --Posterior cerebral arteries: Normal. ANTERIOR CIRCULATION: --Intracranial internal carotid arteries: Normal. --Anterior cerebral arteries (ACA): Normal. --Middle cerebral arteries (MCA): Normal. ANATOMIC VARIANTS: None IMPRESSION: Normal intracranial MRA. Electronically Signed   By: Ulyses Jarred M.D.   On: 11/14/2021 23:46   MR BRAIN WO CONTRAST  Result Date: 11/14/2021 CLINICAL DATA:  Follow-up questionable pontine lesion seen EXAM: MRI HEAD WITHOUT CONTRAST TECHNIQUE: Multiplanar, multiecho pulse sequences of the brain and surrounding structures were obtained without intravenous contrast. COMPARISON:  Same-day CT head, brain MRI 05/18/2020 FINDINGS: Brain: There is a small focus of diffusion restriction in the right corona radiata with associated FLAIR signal abnormality consistent with small acute infarct. There is no associated hemorrhage or mass effect. There is no other evidence of acute infarct. Specifically, there is no acute abnormality in the pons. There is no acute intracranial hemorrhage or extra-axial fluid collection. There is unchanged background parenchymal volume loss with prominence of the ventricular system and extra-axial CSF spaces. FLAIR signal abnormality in the supratentorial white matter is nonspecific but likely reflects sequela of chronic small vessel ischemic change, unchanged since the prior MRI from 2022. Punctate chronic microhemorrhages in the right frontal lobe are unchanged (10-46). There is no solid mass lesion. There is no mass effect or midline shift. Vascular: Normal flow voids. Skull and upper cervical spine: Normal marrow signal. Sinuses/Orbits: The paranasal sinuses are clear. Bilateral lens implants are in place. The globes and orbits are otherwise unremarkable. Other: None. IMPRESSION: 1. Small acute infarct in the right corona radiata without  hemorrhage or mass effect. 2. No other acute intracranial pathology. No acute finding in the pons as questioned on the prior CT. Electronically Signed   By: Valetta Mole M.D.   On: 11/14/2021 19:42   CT Head Wo Contrast  Result Date: 11/14/2021 CLINICAL DATA:  Altered mental status EXAM: CT HEAD WITHOUT CONTRAST TECHNIQUE: Contiguous axial images were obtained from the base of the skull through the vertex without intravenous contrast. RADIATION DOSE REDUCTION: This exam was performed according to the departmental dose-optimization program which includes automated exposure control, adjustment of the mA and/or kV according to patient size and/or use of iterative reconstruction technique. COMPARISON:  CT head 05/17/2020 FINDINGS: Brain: No evidence of hemorrhage, extra-axial collection or mass lesion/mass effect. There is ventriculomegaly which is likely related to the degree of volume loss. There is a new focal hypodense lesion in the upper pons (series 2, image 13). Sequela of severe chronic microvascular ischemic change and advanced generalized volume loss. Vascular: No hyperdense vessel or unexpected calcification.  Skull: Normal. Negative for fracture or focal lesion. Sinuses/Orbits: Bilateral lens replacement. Other: None. IMPRESSION: New focal hypodense lesion in the ventral pons on the left may be artifactual or represent an age-indeterminate infarct. Recommend MRI for further evaluation. Electronically Signed   By: Marin Roberts M.D.   On: 11/14/2021 16:34    Scheduled Meds:  aspirin EC  81 mg Oral Daily   enoxaparin (LOVENOX) injection  40 mg Subcutaneous Q24H   pantoprazole  40 mg Oral Daily   [START ON 11/23/2021] thiamine (VITAMIN B1) injection  100 mg Intravenous Daily   Continuous Infusions:  thiamine (VITAMIN B1) injection 500 mg (11/15/21 1743)   Followed by   Derrill Memo ON 11/17/2021] thiamine (VITAMIN B1) injection      LOS: 0 days   Raiford Noble, DO [facility] Available via  Epic secure chat 7am-7pm After these hours, please refer to coverage provider listed on [email or web address] 11/15/2021, 8:17 PM

## 2021-11-15 NOTE — Procedures (Signed)
Patient Name: Jose [name]  MRN: [id number]  Epilepsy Attending: Lora Havens  Referring Physician/Provider: Marcelyn Bruins, MD Date: 11/15/2021 Duration: 25.25 mins  Patient history: 86yo M with ams. EEG to evaluate for seizure  Level of alertness: Awake  AEDs during EEG study: None  Technical aspects: This EEG study was done with scalp electrodes positioned according to the 10-20 International system of electrode placement. Electrical activity was reviewed with band pass filter of 1-'70Hz'$ , sensitivity of 7 uV/mm, display speed of 41m/sec with a '60Hz'$  notched filter applied as appropriate. EEG data were recorded continuously and digitally stored.  Video monitoring was available and reviewed as appropriate.  Description: The posterior dominant rhythm consists of 9 Hz activity of moderate voltage (25-35 uV) seen predominantly in posterior head regions, symmetric and reactive to eye opening and eye closing. EEG showed intermittent generalized 3 to 6 Hz theta-delta slowing. Hyperventilation and photic stimulation were not performed.     ABNORMALITY - Intermittent slow, generalized  IMPRESSION: This study is suggestive of mild diffuse encephalopathy, nonspecific etiology. No seizures or epileptiform discharges were seen throughout the recording.   Bettylee Feig OBarbra Sarks

## 2021-11-15 NOTE — Progress Notes (Signed)
Echocardiogram 2D Echocardiogram has been performed.  Fidel Levy 11/15/2021, 8:43 AM

## 2021-11-15 NOTE — Consult Note (Signed)
NEUROLOGY CONSULTATION NOTE   Date of service: November 15, 2021 Patient Name: Jose [name] MRN:  [id number] DOB:  August 19, 1935 Reason for consult: "encephalopathy" Requesting Provider: Marcelyn Bruins, MD _ _ _   _ __   _ __ _ _  __ __   _ __   __ _  History of Present Illness  Jose [name] is a 86 y.o. male with PMH significant for EtOH use, GERD, prior hx of thiamine deficieincy, OSA, HLD who has been more confused over the last few days. He quit alcohol a few months ago but then relapsed and then quit again a week ago. He has been more confused, shaking with body aches over the last few days.  Workup with CTH with some concern for a L pontine stroke. However, MRI Brain demonstrated a small punctate R corona radiata stroke.  On my evaluation, patient is confused. He is oriented to himself only. He is easliy agitated. Unable to provide any meaningful history at this time. Attempted to call significant other on the listed phone but unable to get in touch with her for further history and colateral.   ROS   Unable to get detailed ROS 2/2 encephalopathy. He denies any pain.  Past History   Past Medical History:  Diagnosis Date   Acute meniscal tear of knee    RIGHT KNEE   Arthritis    Hives    PER PT UNKNOWN WHY   IBS (irritable bowel syndrome)    Mild sleep apnea    PER STUDY  11-30-2010--  NO CPAP   Spinal stenosis    Past Surgical History:  Procedure Laterality Date   APPENDECTOMY     KNEE ARTHROSCOPY WITH LATERAL MENISECTOMY Right 04/12/2012   Procedure: RIGHT KNEE ARTHROSCOPY WITH PARTIAL MEDIAL AND LATERAL MENISECTOMY;  Surgeon: Magnus Sinning, MD;  Location: [facility];  Service: Orthopedics;  Laterality: Right;   SHOULDER ARTHROSCOPY WITH ROTATOR CUFF REPAIR AND SUBACROMIAL DECOMPRESSION  12-21-1999  &  01-28-2004   Family History  Problem Relation Age of Onset   Heart disease Mother    Heart disease Father    Social History    Socioeconomic History   Marital status: Divorced    Spouse name: Not on file   Number of children: Not on file   Years of education: Not on file   Highest education level: Not on file  Occupational History   Not on file  Tobacco Use   Smoking status: Former    Years: 17.00    Types: Cigarettes   Smokeless tobacco: Never   Tobacco comments:    QUIT SMOKING 68 YRS AGO  Vaping Use   Vaping Use: Never used  Substance and Sexual Activity   Alcohol use: Yes    Comment: ONE BOTTLE WINE PER DAY   Drug use: No   Sexual activity: Not on file  Other Topics Concern   Not on file  Social History Narrative   Right handed   Lives alone   Social Determinants of Health   Financial Resource Strain: Not on file  Food Insecurity: Food Insecurity Present (11/14/2021)   Hunger Vital Sign    Worried About Running Out of Food in the Last Year: Sometimes true    Ran Out of Food in the Last Year: Sometimes true  Transportation Needs: No Transportation Needs (11/14/2021)   PRAPARE - Hydrologist (Medical): No    Lack of Transportation (Non-Medical): No  Physical Activity: Not on file  Stress: Not on file  Social Connections: Not on file   No Known Allergies  Medications  (Not in a hospital admission)    Vitals   Vitals:   11/14/21 2000 11/14/21 2015 11/15/21 0000 11/15/21 0316  BP: 131/63 129/88 (!) 143/75 (!) 147/87  Pulse: (!) 102 65 94 (!) 120  Resp: (!) 27 (!) 25 (!) 24 20  Temp:  98.8 F (37.1 C) 98.8 F (37.1 C)   TempSrc:      SpO2: 93% 93% 97% 91%  Weight:      Height:         Body mass index is 26.95 kg/m.  Physical Exam   General: Laying comfortably in bed; in no acute distress.  HENT: Normal oropharynx and mucosa. Normal external appearance of ears and nose.  Neck: Supple, no pain or tenderness  CV: No JVD. No peripheral edema.  Pulmonary: Symmetric Chest rise. Normal respiratory effort.  Abdomen: Soft to touch, non-tender.   Ext: No cyanosis, edema, or deformity  Skin: No rash. Normal palpation of skin.   Musculoskeletal: Normal digits and nails by inspection. No clubbing.   Neurologic Examination  Mental status/Cognition: Alert, oriented to self only, poor attention and attempting to take off cardiac monitor leads. Loses his train of thoughts. Speech/language: Fluent, comprehension intact to simple commands. Able to name some objects. Cranial nerves:   CN II Pupils equal and reactive to light, no VF deficits    CN III,IV,VI EOM intact, no gaze preference or deviation, no nystagmus    CN V normal sensation in V1, V2, and V3 segments bilaterally    CN VII no asymmetry, no nasolabial fold flattening    CN VIII normal hearing to speech    CN IX & X normal palatal elevation, no uvular deviation    CN XI 5/5 head turn and 5/5 shoulder shrug bilaterally    CN XII midline tongue protrusion    Motor:  Muscle bulk: poor, tone normal, pronator drift none tremor none Mvmt Root Nerve  Muscle Right Left Comments  SA C5/6 Ax Deltoid 4 5 Hx of R rotator cuff injury  EF C5/6 Mc Biceps     EE C6/7/8 Rad Triceps     WF C6/7 Med FCR     WE C7/8 PIN ECU     F Ab C8/T1 U ADM/FDI 5 5   HF L1/2/3 Fem Illopsoas 5 4   KE L2/3/4 Fem Quad     DF L4/5 D Peron Tib Ant 5 5   PF S1/2 Tibial Grc/Sol 5 5    Sensation:  Light touch Intact throughout   Pin prick    Temperature    Vibration   Proprioception    Coordination/Complex Motor:  - Finger to Nose intact BL - Heel to shin unable to get him to do it. - Rapid alternating movement are slowed throughout - Gait: Deferred for patient safety.  Labs   CBC:  Recent Labs  Lab 11/14/21 1345  WBC 14.7*  NEUTROABS 10.4*  HGB 15.9  HCT 48.8  MCV 94.4  PLT 950    Basic Metabolic Panel:  Lab Results  Component Value Date   NA 135 11/14/2021   K 4.1 11/14/2021   CO2 22 11/14/2021   GLUCOSE 126 (H) 11/14/2021   BUN 16 11/14/2021   CREATININE 1.04 11/14/2021    CALCIUM 9.6 11/14/2021   GFRNONAA >60 11/14/2021   GFRAA >60 08/29/2018   Lipid Panel: No  results found for: "[place]" HgbA1c:  Lab Results  Component Value Date   HGBA1C 5.6 05/20/2020   Urine Drug Screen: No results found for: "LABOPIA", "COCAINSCRNUR", "LABBENZ", "AMPHETMU", "THCU", "LABBARB"  Alcohol Level     Component Value Date/Time   ETH <10 11/14/2021 1345    CT Head without contrast(Personally reviewed): CTH was negative for a large hypodensity concerning for a large territory infarct or hyperdensity concerning for an ICH  MR Angio head without contrast and MR angio neck with contrast(Personally reviewed): No LVO  MRI Brain(Personally reviewed): Small punctate R caudate infarct  rEEG:  pending  Impression   Jose [name] is a 86 y.o. male with hx of EtOh use and quit EtOH again about a week ago who presents with increased confusion, tremulousness along with tachycardia.  Unclear if he truly quit a few days ago but high suspicion that he is probably in alcohol withdrawal. The noted stroke on the imaging is likely incidental and does not explain his current presentation.  Recommendations  Incidental R corona radiata stroke:(does not explain encephalopathy) - Frequent Neuro checks per stroke unit protocol - Recommend Vascular imaging with MRA Angio Head without contrast and US Carotid doppler - Recommend obtaining TTE - Recommend obtaining Lipid panel with LDL - Please start statin if LDL > 70 - Recommend HbA1c - Antithrombotic - Aspirin '81mg'$  daily. - Recommend DVT ppx - SBP goal - permissive hypertension first 24 h < 220/110. Held home meds.  - Recommend Telemetry monitoring for arrythmia - Recommend bedside swallow screen prior to PO intake. - Stroke education booklet - Recommend PT/OT/SLP consult - Recommend Urine Tox screen.   Toxic metabolic Encephalopathy, likely in the setting of acute EtOH withdrawal: - Thiamine levels urgent, followed by  high dose thiamine replacement. - Vit B12, folate, TSH. - rEEG. - CIWA protocol. - judicious use of sedating medications. - recommend infectious/metabolic workup with UA with reflex to Ucx, CXR, procalcitonin, lactate. ______________________________________________________________________   Thank you for the opportunity to take part in the care of this patient. If you have any further questions, please contact the neurology consultation attending.  Signed,  [place] Pager Number [phone] _ _ _   _ __   _ __ _ _  __ __   _ __   __ _

## 2021-11-15 NOTE — Evaluation (Signed)
Physical Therapy Evaluation Patient Details Name: Jose [name] MRN: [id number] DOB: 1935/10/21 Today's Date: 11/15/2021  History of Present Illness  Jose [name] is a 86 y.o. male has been more confused over the last few days. He quit alcohol a few months ago but then relapsed and then quit again a week ago. He has been more confused, shaking with body aches over the last few days. CTH with some concern for a L pontine stroke. However, MRI Brain demonstrated a small punctate R corona radiata stroke. PMH significant for EtOH use, GERD, prior hx of thiamine deficieincy, OSA, HLD.   Clinical Impression  Jose [name] is 86 y.o. male admitted with above HPI and diagnosis. Patient is currently limited by functional impairments below (see PT problem list). Patient lives alone and is independent at baseline; exercises 5x/week and is a Production designer, theatre/television/film. Currently he is limited by cognitive impairments and coordination deficits as well as Rt shoulder pain. He required Max/Total assist for bed mobility and Mod +2 for safety with sit<>stands, pt was unable to progress to gait today. Patient will benefit from continued skilled PT interventions to address impairments and progress independence with mobility, recommending intense rehab follow up at AIR setting. Acute PT will follow and progress as able.        Recommendations for follow up therapy are one component of a multi-disciplinary discharge planning process, led by the attending physician.  Recommendations may be updated based on patient status, additional functional criteria and insurance authorization.  Follow Up Recommendations Acute inpatient rehab (3hours/day)      Assistance Recommended at Discharge Frequent or constant Supervision/Assistance  Patient can return home with the following  A lot of help with walking and/or transfers;A lot of help with bathing/dressing/bathroom;Assistance with cooking/housework;Direct  supervision/assist for medications management;Direct supervision/assist for financial management;Assist for transportation;Help with stairs or ramp for entrance    Equipment Recommendations Rolling walker (2 wheels) (TBA)  Recommendations for Other Services       Functional Status Assessment Patient has had a recent decline in their functional status and demonstrates the ability to make significant improvements in function in a reasonable and predictable amount of time.     Precautions / Restrictions Precautions Precautions: Fall Precaution Comments: permissive HTN, Rt shoulder and elbow pain Restrictions Weight Bearing Restrictions: No      Mobility  Bed Mobility Overal bed mobility: Needs Assistance Bed Mobility: Supine to Sit, Sit to Supine     Supine to sit: Max assist, +2 for physical assistance, +2 for safety/equipment, HOB elevated Sit to supine: Total assist, HOB elevated, +2 for safety/equipment, +2 for physical assistance   General bed mobility comments: Pt limited by Rt shoulder pain and Max-Total assist required wtih use of bed pad to bring LE's off EOB, pivot hips, and raise trunk upright. min gaurd/assist to steady balance while seated. Total to return to supine safely.    Transfers Overall transfer level: Needs assistance Equipment used: 2 person hand held assist Transfers: Sit to/from Stand Sit to Stand: Mod assist, +2 physical assistance, +2 safety/equipment, From elevated surface           General transfer comment: Mod+2 assist for power up from EOB. Pt with significant difficulty sequencing steps at EOB and maintaining upright standing posture. stood 2x from EOB with bil HHA.    Ambulation/Gait                  Stairs  Wheelchair Mobility    Modified Rankin (Stroke Patients Only)       Balance Overall balance assessment: Needs assistance Sitting-balance support: Feet supported, Bilateral upper extremity  supported Sitting balance-Leahy Scale: Poor     Standing balance support: Bilateral upper extremity supported Standing balance-Leahy Scale: Poor                               Pertinent Vitals/Pain      Home Living Family/patient expects to be discharged to:: Private residence Living Arrangements: Alone Available Help at Discharge: Other (Comment) (Significant other, [name] who lives nearby) Type of Home: House Home Access: Stairs to enter   [employer] of Steps: 1 (threshold) Alternate Level Stairs-Number of Steps: Laundry and computer in basement. Pt likes to access but necessary. Home Layout: Two level Home Equipment: Adaptive equipment;Rolling Walker (2 wheels);Cane - single point Additional Comments: pt typically very active and independent. he workouts at the gym ~5x/week and    Prior Function Prior Level of Function : Independent/Modified Independent;Driving;Working/employed       Physical Assist : ADLs (physical)   ADLs (physical): IADLs Mobility Comments: Recently going to gym 5 days a week, now down to 2x/wk. ADLs Comments: Has housekeeper every other week. 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"     Hand Dominance   Dominant Hand: Right    Extremity/Trunk Assessment   Upper Extremity Assessment Upper Extremity Assessment: Defer to OT evaluation    Lower Extremity Assessment Lower Extremity Assessment: LLE deficits/detail;RLE deficits/detail RLE Deficits / Details: grossly pt is at 3/5 on Rt LE with hip flexion, knee extension, and plantar flexion, 4-/5 for dorsiflexion RLE Sensation: WNL RLE Coordination: WNL LLE Deficits / Details: grossly pt is at 4-/5 on Rt LE with hip flexion, knee extension, and dorsiflexion, 3/5 for plantarflexion LLE Sensation: WNL LLE Coordination: WNL    Cervical / Trunk Assessment Cervical / Trunk Assessment: Kyphotic  Communication   Communication: No difficulties ("dry mouth")  Cognition Arousal/Alertness:  Awake/alert Behavior During Therapy: WFL for tasks assessed/performed Overall Cognitive Status: Impaired/Different from baseline Area of Impairment: Orientation, Memory, Following commands, Safety/judgement, Awareness, Problem solving                 Orientation Level: Situation, Disoriented to   Memory: Decreased short-term memory Following Commands: Follows one step commands inconsistently, Follows one step commands with increased time Safety/Judgement: Decreased awareness of deficits, Decreased awareness of safety Awareness: Intellectual Problem Solving: Requires verbal cues, Requires tactile cues, Slow processing, Decreased initiation, Difficulty sequencing General Comments: pt's significant other present and reports he is different than baseline is is very confused.        General Comments      Exercises     Assessment/Plan    PT Assessment Patient needs continued PT services  PT Problem List Decreased strength;Decreased range of motion;Decreased activity tolerance;Decreased balance;Decreased mobility;Decreased cognition;Decreased knowledge of use of DME;Decreased safety awareness;Decreased knowledge of precautions;Decreased coordination       PT Treatment Interventions DME instruction;Gait training;Stair training;Functional mobility training;Therapeutic activities;Therapeutic exercise;Balance training;Patient/family education    PT Goals (Current goals can be found in the Care Plan section)  Acute Rehab PT Goals Patient Stated Goal: regain independence PT Goal Formulation: With patient/family Time For Goal Achievement: 11/29/21 Potential to Achieve Goals: Good    Frequency Min 4X/week     Co-evaluation               AM-PAC PT "6 Clicks" Mobility  Outcome Measure Help needed turning from your back to your side while in a flat bed without using bedrails?: A Lot Help needed moving from lying on your back to sitting on the side of a flat bed without using  bedrails?: A Lot Help needed moving to and from a bed to a chair (including a wheelchair)?: A Lot Help needed standing up from a chair using your arms (e.g., wheelchair or bedside chair)?: Total Help needed to walk in hospital room?: Total Help needed climbing 3-5 steps with a railing? : Total 6 Click Score: 9    End of Session Equipment Utilized During Treatment: Gait belt Activity Tolerance: Patient tolerated treatment well Patient left: in bed;with call bell/phone within reach;with family/visitor present Nurse Communication: Mobility status PT Visit Diagnosis: Unsteadiness on feet (R26.81);Muscle weakness (generalized) (M62.81);Difficulty in walking, not elsewhere classified (R26.2);Other abnormalities of gait and mobility (R26.89)    Time: 8675-4492 PT Time Calculation (min) (ACUTE ONLY): 30 min   Charges:   PT Evaluation $PT Eval Moderate Complexity: 1 Mod          Verner Mould, DPT Acute Rehabilitation Services Office [phone]  11/15/21 4:39 PM

## 2021-11-15 NOTE — Progress Notes (Signed)
EEG complete - results pending 

## 2021-11-15 NOTE — Progress Notes (Signed)
SLP Cancellation Note  Patient Details Name: YOAV OKANE MRN: [id number] DOB: 18-Jul-1935   Cancelled treatment:       Reason Eval/Treat Not Completed: Patient at procedure or test/unavailable  Sonia Baller, MA, CCC-SLP Speech Therapy

## 2021-11-16 ENCOUNTER — Observation Stay (HOSPITAL_COMMUNITY): Payer: Medicare Other

## 2021-11-16 DIAGNOSIS — G4733 Obstructive sleep apnea (adult) (pediatric): Secondary | ICD-10-CM | POA: Diagnosis present

## 2021-11-16 DIAGNOSIS — Z79899 Other long term (current) drug therapy: Secondary | ICD-10-CM | POA: Diagnosis not present

## 2021-11-16 DIAGNOSIS — E876 Hypokalemia: Secondary | ICD-10-CM | POA: Diagnosis present

## 2021-11-16 DIAGNOSIS — E872 Acidosis, unspecified: Secondary | ICD-10-CM | POA: Diagnosis present

## 2021-11-16 DIAGNOSIS — I6381 Other cerebral infarction due to occlusion or stenosis of small artery: Secondary | ICD-10-CM | POA: Diagnosis present

## 2021-11-16 DIAGNOSIS — E871 Hypo-osmolality and hyponatremia: Secondary | ICD-10-CM | POA: Diagnosis present

## 2021-11-16 DIAGNOSIS — M25521 Pain in right elbow: Secondary | ICD-10-CM

## 2021-11-16 DIAGNOSIS — R270 Ataxia, unspecified: Secondary | ICD-10-CM | POA: Diagnosis present

## 2021-11-16 DIAGNOSIS — E8809 Other disorders of plasma-protein metabolism, not elsewhere classified: Secondary | ICD-10-CM | POA: Diagnosis present

## 2021-11-16 DIAGNOSIS — I10 Essential (primary) hypertension: Secondary | ICD-10-CM | POA: Diagnosis present

## 2021-11-16 DIAGNOSIS — K219 Gastro-esophageal reflux disease without esophagitis: Secondary | ICD-10-CM | POA: Diagnosis present

## 2021-11-16 DIAGNOSIS — E512 Wernicke's encephalopathy: Secondary | ICD-10-CM | POA: Diagnosis present

## 2021-11-16 DIAGNOSIS — F1090 Alcohol use, unspecified, uncomplicated: Secondary | ICD-10-CM | POA: Diagnosis present

## 2021-11-16 DIAGNOSIS — I639 Cerebral infarction, unspecified: Secondary | ICD-10-CM | POA: Diagnosis present

## 2021-11-16 DIAGNOSIS — M109 Gout, unspecified: Secondary | ICD-10-CM | POA: Diagnosis present

## 2021-11-16 DIAGNOSIS — Z8249 Family history of ischemic heart disease and other diseases of the circulatory system: Secondary | ICD-10-CM | POA: Diagnosis not present

## 2021-11-16 DIAGNOSIS — F1010 Alcohol abuse, uncomplicated: Secondary | ICD-10-CM | POA: Diagnosis not present

## 2021-11-16 DIAGNOSIS — E785 Hyperlipidemia, unspecified: Secondary | ICD-10-CM | POA: Diagnosis present

## 2021-11-16 DIAGNOSIS — R0902 Hypoxemia: Secondary | ICD-10-CM | POA: Diagnosis present

## 2021-11-16 DIAGNOSIS — M1990 Unspecified osteoarthritis, unspecified site: Secondary | ICD-10-CM | POA: Diagnosis present

## 2021-11-16 DIAGNOSIS — K589 Irritable bowel syndrome without diarrhea: Secondary | ICD-10-CM | POA: Diagnosis present

## 2021-11-16 DIAGNOSIS — E519 Thiamine deficiency, unspecified: Secondary | ICD-10-CM | POA: Diagnosis not present

## 2021-11-16 DIAGNOSIS — M10021 Idiopathic gout, right elbow: Secondary | ICD-10-CM | POA: Diagnosis not present

## 2021-11-16 DIAGNOSIS — Z87891 Personal history of nicotine dependence: Secondary | ICD-10-CM | POA: Diagnosis not present

## 2021-11-16 DIAGNOSIS — N3281 Overactive bladder: Secondary | ICD-10-CM | POA: Diagnosis not present

## 2021-11-16 LAB — COMPREHENSIVE METABOLIC PANEL
ALT: 13 U/L (ref 0–44)
AST: 22 U/L (ref 15–41)
Albumin: 2.6 g/dL — ABNORMAL LOW (ref 3.5–5.0)
Alkaline Phosphatase: 47 U/L (ref 38–126)
Anion gap: 13 (ref 5–15)
BUN: 9 mg/dL (ref 8–23)
CO2: 21 mmol/L — ABNORMAL LOW (ref 22–32)
Calcium: 9.4 mg/dL (ref 8.9–10.3)
Chloride: 101 mmol/L (ref 98–111)
Creatinine, Ser: 0.81 mg/dL (ref 0.61–1.24)
GFR, Estimated: >60 mL/min (ref >60)
Glucose, Bld: 95 mg/dL (ref 70–99)
Potassium: 3.4 mmol/L — ABNORMAL LOW (ref 3.5–5.1)
Sodium: 135 mmol/L (ref 135–145)
Total Bilirubin: 1.1 mg/dL (ref 0.3–1.2)
Total Protein: 5.9 g/dL — ABNORMAL LOW (ref 6.5–8.1)

## 2021-11-16 LAB — CBC WITH DIFFERENTIAL/PLATELET
Abs Immature Granulocytes: 0.09 10*3/uL — ABNORMAL HIGH (ref 0.00–0.07)
Basophils Absolute: 0 10*3/uL (ref 0.0–0.1)
Basophils Relative: 0 %
Eosinophils Absolute: 0 10*3/uL (ref 0.0–0.5)
Eosinophils Relative: 0 %
HCT: 38 % — ABNORMAL LOW (ref 39.0–52.0)
Hemoglobin: 13.3 g/dL (ref 13.0–17.0)
Immature Granulocytes: 1 %
Lymphocytes Relative: 8 %
Lymphs Abs: 1 10*3/uL (ref 0.7–4.0)
MCH: 31.5 pg (ref 26.0–34.0)
MCHC: 35 g/dL (ref 30.0–36.0)
MCV: 90 fL (ref 80.0–100.0)
Monocytes Absolute: 2.7 10*3/uL — ABNORMAL HIGH (ref 0.1–1.0)
Monocytes Relative: 23 %
Neutro Abs: 8.1 10*3/uL — ABNORMAL HIGH (ref 1.7–7.7)
Neutrophils Relative %: 68 %
Platelets: 276 10*3/uL (ref 150–400)
RBC: 4.22 MIL/uL (ref 4.22–5.81)
RDW: 12.6 % (ref 11.5–15.5)
WBC: 11.9 10*3/uL — ABNORMAL HIGH (ref 4.0–10.5)
nRBC: 0 % (ref 0.0–0.2)

## 2021-11-16 LAB — URINE CULTURE: Culture: NO GROWTH

## 2021-11-16 LAB — MAGNESIUM: Magnesium: 1.9 mg/dL (ref 1.7–2.4)

## 2021-11-16 LAB — PHOSPHORUS: Phosphorus: 3 mg/dL (ref 2.5–4.6)

## 2021-11-16 MED ORDER — POTASSIUM CHLORIDE CRYS ER 20 MEQ PO TBCR
40.0000 meq | EXTENDED_RELEASE_TABLET | Freq: Once | ORAL | Status: AC
  Administered 2021-11-16: 40 meq via ORAL
  Filled 2021-11-16: qty 2

## 2021-11-16 MED ORDER — ADULT MULTIVITAMIN W/MINERALS CH
1.0000 | ORAL_TABLET | Freq: Every day | ORAL | Status: DC
  Administered 2021-11-16 – 2021-11-20 (×4): 1 via ORAL
  Filled 2021-11-16 (×5): qty 1

## 2021-11-16 MED ORDER — ENSURE ENLIVE PO LIQD
237.0000 mL | Freq: Two times a day (BID) | ORAL | Status: DC
  Administered 2021-11-17 – 2021-11-20 (×3): 237 mL via ORAL

## 2021-11-16 NOTE — Progress Notes (Signed)
Physical Therapy Treatment Patient Details Name: Jose [name] MRN: [id number] DOB: Aug 21, 1935 Today's Date: 11/16/2021   History of Present Illness Jose [name] is a 86 y.o. male has been more confused over the last few days. He quit alcohol a few months ago but then relapsed and then quit again a week ago. He has been more confused, shaking with body aches over the last few days. CTH with some concern for a L pontine stroke. However, MRI Brain demonstrated a small punctate R corona radiata stroke. PMH significant for EtOH use, GERD, prior hx of thiamine deficieincy, OSA, HLD.    PT Comments    Patient not tolerating mobility well with R shoulder and elbow and back pain throughout.  Not aware of location, situation or time and yelling out in pain despite calm reassurance and directions.  Patient was able to stand twice, but not able to take steps even with lateral weight shifting and cues.  Concern for his potential, but will continue to recommend AIR for now pending further attempts at progress.    Recommendations for follow up therapy are one component of a multi-disciplinary discharge planning process, led by the attending physician.  Recommendations may be updated based on patient status, additional functional criteria and insurance authorization.  Follow Up Recommendations  Acute inpatient rehab (3hours/day)     Assistance Recommended at Discharge Frequent or constant Supervision/Assistance  Patient can return home with the following Two people to help with bathing/dressing/bathroom;Two people to help with walking and/or transfers;Direct supervision/assist for medications management;Assistance with cooking/housework;Assist for transportation;Help with stairs or ramp for entrance   Equipment Recommendations  Other (comment) (TBA)    Recommendations for Other Services       Precautions / Restrictions Precautions Precautions: Fall Precaution Comments: permissive HTN,  Rt shoulder and elbow pain     Mobility  Bed Mobility Overal bed mobility: Needs Assistance Bed Mobility: Supine to Sit, Sit to Supine, Sit to Sidelying     Supine to sit: +2 for physical assistance, Mod assist, HOB elevated   Sit to sidelying: Max assist, +2 for physical assistance General bed mobility comments: R shoulder pain and elbow pain so elevated HOB and assisted to scoot legs to EOB then heavy assist to lift trunk pt pulling L UE and to scoot hips; to side due to back pain assist for preventing R UE use and pain and for LE's    Transfers Overall transfer level: Needs assistance Equipment used: Rolling walker (2 wheels) Transfers: Sit to/from Stand Sit to Stand: Mod assist, +2 physical assistance, From elevated surface           General transfer comment: up to standing x 2 to RW with pt leaning posteriorly attempting stepping, but pt fearful and leaning more posteriorly    Ambulation/Gait                   Stairs             Wheelchair Mobility    Modified Rankin (Stroke Patients Only) Modified Rankin (Stroke Patients Only) Pre-Morbid Rankin Score: Moderate disability Modified Rankin: Severe disability     Balance Overall balance assessment: Needs assistance Sitting-balance support: Feet supported, Bilateral upper extremity supported Sitting balance-Leahy Scale: Poor Sitting balance - Comments: leans posteriorly assist for anterior weight shift, leaning forward for back stretch with assist, still needing min to mod support for balance Postural control: Posterior lean Standing balance support: Bilateral upper extremity supported Standing balance-Leahy Scale: Poor Standing balance comment:  mod A for balance with bilat UE support +2 for safety                            Cognition Arousal/Alertness: Awake/alert Behavior During Therapy: Anxious Overall Cognitive Status: Impaired/Different from baseline Area of Impairment:  Orientation, Memory, Following commands, Safety/judgement, Awareness, Problem solving                 Orientation Level: Disoriented to, Time, Place, Situation   Memory: Decreased short-term memory Following Commands: Follows one step commands inconsistently, Follows one step commands with increased time Safety/Judgement: Decreased awareness of safety, Decreased awareness of deficits   Problem Solving: Slow processing, Decreased initiation, Requires verbal cues, Requires tactile cues General Comments: would not state location except "yes" when asked if he was at home, would not state age, just "I'm damn old"        Exercises General Exercises - Lower Extremity Ankle Circles/Pumps: AROM, AAROM, Both, 10 reps, Supine Short Arc Quad: AROM, Both, 10 reps, Supine Heel Slides: AAROM, Both, Supine    General Comments General comments (skin integrity, edema, etc.): R elbow with some edema and bruising on L knee and scrape on L arm, pt reports always bumping into things.  Assisted with set up for pt to brush teeth with L hand in supine and assisted in removing denture gel from front teeth and applying lip moisturizer due      Pertinent Vitals/Pain Pain Assessment Faces Pain Scale: Hurts whole lot Pain Location: R elbow and shoulder and back Pain Descriptors / Indicators: Grimacing, Guarding, Moaning Pain Intervention(s): Monitored during session, Repositioned, Limited activity within patient's tolerance    Home Living                          Prior Function            PT Goals (current goals can now be found in the care plan section) Progress towards PT goals: Not progressing toward goals - comment    Frequency    Min 4X/week      PT Plan Current plan remains appropriate    Co-evaluation              AM-PAC PT "6 Clicks" Mobility   Outcome Measure  Help needed turning from your back to your side while in a flat bed without using bedrails?:  Total Help needed moving from lying on your back to sitting on the side of a flat bed without using bedrails?: Total Help needed moving to and from a bed to a chair (including a wheelchair)?: Total Help needed standing up from a chair using your arms (e.g., wheelchair or bedside chair)?: Total Help needed to walk in hospital room?: Total Help needed climbing 3-5 steps with a railing? : Total 6 Click Score: 6    End of Session Equipment Utilized During Treatment: Gait belt Activity Tolerance: Patient limited by pain Patient left: in bed;with call bell/phone within reach;with bed alarm set Nurse Communication: Mobility status PT Visit Diagnosis: Unsteadiness on feet (R26.81);Muscle weakness (generalized) (M62.81);Difficulty in walking, not elsewhere classified (R26.2);Other abnormalities of gait and mobility (R26.89)     Time: 0930-1004 PT Time Calculation (min) (ACUTE ONLY): 34 min  Charges:  $Therapeutic Exercise: 8-22 mins $Therapeutic Activity: 8-22 mins                     Magda Kiel, PT Acute Rehabilitation Services  Office:[phone] 11/16/2021    Reginia Naas 11/16/2021, 11:19 AM

## 2021-11-16 NOTE — Progress Notes (Signed)
[facility] PROGRESS NOTE   YOSEF KROGH DVV:[id number] DOB: 07-Jan-1936 DOA: 11/14/2021  PCP: Hoyt Koch, MD  Brief History/Interval Summary: 86 year old Caucasian male with past medical history of alcohol abuse, GERD, thiamine deficiency, ataxia presented with generalized pain.  Found to be encephalopathic.  Work-up revealed acute stroke.  He was transferred to [facility] for further management.  Consultants: Neurology  Procedures: None    Subjective/Interval History: Patient complains of pain in the right elbow area.  Mentions that has been ongoing for several years.  History is not entirely reliable.    Assessment/Plan:  Acute metabolic encephalopathy Apparently has had increased confusion and weakness for several days prior to admission.  There was concern for significant thiamine deficiency/Warnicke's.  He was started on high-dose thiamine in the emergency department.  Seen by neurology.  Underwent MRI which showed acute stroke.  See below. B12 level 923, folate 17.6, TSH 2.5. WBC was noted to be elevated.  No evidence of infection on UA.  Chest x-ray more consistent with atelectasis rather than pneumonia.  Procalcitonin level was 0.65.  Lactic acid was normal. Reorient daily.  Acute CVA Acute infarct in the right corona radiata noted on MRI.  Management per neurology. LDL 71.  HbA1c 5.3.  Echocardiogram shows normal systolic function. EEG did not show any epileptiform activity. PT and OT evaluation. Patient on aspirin and statin.  Right elbow pain Mild swelling is noted in the right elbow area.  Seems to be experiencing pain and discomfort when the right arm is mobilized.  Denies any shoulder pain.  We will pursue imaging studies of the right elbow.  Normal anion gap metabolic acidosis Stable.  Hypokalemia Will be repleted.  Magnesium is 1.9.  History of alcohol use Was drinking up until 4 to 5 days prior to admission.  No  evidence for withdrawal.   Thiamine deficiency Thiamine level was low in June at 104.  Repeat level is pending.  Continue thiamine.    DVT Prophylaxis: Lovenox Code Status: Full code Family Communication: No family at bedside Disposition Plan: To be determined  Status is: Observation The patient will require care spanning > 2 midnights and should be moved to inpatient because: Acute stroke      Medications: Scheduled:  aspirin EC  81 mg Oral Daily   atorvastatin  40 mg Oral Daily   enoxaparin (LOVENOX) injection  40 mg Subcutaneous Q24H   pantoprazole  40 mg Oral Daily   potassium chloride  40 mEq Oral Once   [START ON 11/23/2021] thiamine (VITAMIN B1) injection  100 mg Intravenous Daily   Continuous:  thiamine (VITAMIN B1) injection 500 mg (11/16/21 1112)   Followed by   Derrill Memo ON 11/17/2021] thiamine (VITAMIN B1) injection     GYI:[id number] **OR** acetaminophen (TYLENOL) oral liquid 160 mg/5 mL **OR** acetaminophen, senna-docusate  Antibiotics: Anti-infectives (From admission, onward)    None       Objective:  Vital Signs  Vitals:   11/15/21 2344 11/16/21 0331 11/16/21 0719 11/16/21 1110  BP: (!) 153/83 (!) 161/93 131/80 127/70  Pulse: 90 73 90 (!) 121  Resp: '18 19 14 18  '$ Temp: 98.6 F (37 C) 98.1 F (36.7 C) 99.7 F (37.6 C) 100 F (37.8 C)  TempSrc:   Oral Oral  SpO2: 100% 96% 95% 97%  Weight:      Height:        Intake/Output Summary (Last 24 hours) at 11/16/2021 1145 Last data filed at 11/16/2021 0800 Gross  per 24 hour  Intake 1253.66 ml  Output 725 ml  Net 528.66 ml   Filed Weights   11/14/21 1134  Weight: 71.2 kg    General appearance: Awake alert.  In no distress.  Somewhat distracted. Resp: Clear to auscultation bilaterally.  Normal effort Cardio: S1-S2 is normal regular.  No S3-S4.  No rubs murmurs or bruit GI: Abdomen is soft.  Nontender nondistended.  Bowel sounds are present normal.  No masses organomegaly Extremities: No  edema.  Mild swelling in the right elbow with restricted range of motion. Neurologic:   No focal neurological deficits.    Lab Results:  Data Reviewed: I have personally reviewed following labs and reports of the imaging studies  CBC: Recent Labs  Lab 11/14/21 1345 11/15/21 0945 11/16/21 0625  WBC 14.7* 10.8* 11.9*  NEUTROABS 10.4* 7.5 8.1*  HGB 15.9 14.5 13.3  HCT 48.8 43.9 38.0*  MCV 94.4 92.6 90.0  PLT 337 295 619    Basic Metabolic Panel: Recent Labs  Lab 11/14/21 1345 11/15/21 0945 11/16/21 0625  NA 135 135 135  K 4.1 3.7 3.4*  CL 99 103 101  CO2 22 20* 21*  GLUCOSE 126* 90 95  BUN '16 18 9  '$ CREATININE 1.04 0.79 0.81  CALCIUM 9.6 9.2 9.4  MG  --  2.0 1.9  PHOS  --  3.4 3.0    GFR: Estimated Creatinine Clearance: 61.5 mL/min (by C-G formula based on SCr of 0.81 mg/dL).  Liver Function Tests: Recent Labs  Lab 11/14/21 1345 11/15/21 0945 11/16/21 0625  AST '21 21 22  '$ ALT '15 14 13  '$ ALKPHOS 63 55 47  BILITOT 1.3* 1.2 1.1  PROT 7.4 6.8 5.9*  ALBUMIN 3.7 3.2* 2.6*    Recent Labs  Lab 11/14/21 1345  LIPASE 39   Recent Labs  Lab 11/14/21 1345  AMMONIA 13      HbA1C: Recent Labs    11/15/21 0522  HGBA1C 5.3     Lipid Profile: Recent Labs    11/15/21 0522  CHOL 126  HDL 43  LDLCALC 71  TRIG 60  CHOLHDL 2.9    Thyroid Function Tests: Recent Labs    11/14/21 2232  TSH 2.504    Anemia Panel: Recent Labs    11/14/21 2232  VITAMINB12 923*  FOLATE 17.6    Recent Results (from the past 240 hour(s))  Urine Culture     Status: None   Collection Time: 11/14/21 10:24 PM   Specimen: Urine, Clean Catch  Result Value Ref Range Status   Specimen Description   Final    URINE, CLEAN CATCH Performed at [facility], [place] [address], [place], [place] [address]    Special Requests   Final    NONE Performed at [facility], [place] [address], [place], [place] [address]    Culture   Final    NO  GROWTH Performed at [facility], [place] [address], [place], [place] [address]    Report Status 11/16/2021 FINAL  Final  Culture, blood (Routine X 2) w Reflex to ID Panel     Status: None (Preliminary result)   Collection Time: 11/15/21  9:40 AM   Specimen: BLOOD  Result Value Ref Range Status   Specimen Description   Final    BLOOD BLOOD LEFT WRIST Performed at [facility] [address], [place], [place] [address]    Special Requests   Final    BOTTLES DRAWN AEROBIC  AND ANAEROBIC Blood Culture adequate volume Performed at [facility] [address], [place], [place] [address]    Culture   Final    NO GROWTH < 24 HOURS Performed at [facility] [address], [place], [place] [address]    Report Status PENDING  Incomplete  Culture, blood (Routine X 2) w Reflex to ID Panel     Status: None (Preliminary result)   Collection Time: 11/25/21  9:45 AM   Specimen: BLOOD  Result Value Ref Range Status   Specimen Description   Final    BLOOD RIGHT ANTECUBITAL Performed at [facility] [address] [place], [place] [address]    Special Requests   Final    BOTTLES DRAWN AEROBIC AND ANAEROBIC Blood Culture adequate volume Performed at [facility] [address], [place], [place] [address]    Culture   Final    NO GROWTH < 24 HOURS Performed at [facility] [address], [place], [place] [address]    Report Status PENDING  Incomplete      Radiology Studies: EEG adult  Result Date: 2021/11/25 Lora Havens, MD     11/25/21  6:04 PM Patient Name: CAI ANFINSON MRN: [id number] Epilepsy Attending: Lora Havens Referring Physician/Provider: Marcelyn Bruins, MD Date: 11/25/21 Duration: 25.25 mins Patient history: 86yo M with ams. EEG to evaluate for seizure Level of alertness: Awake AEDs during EEG study: None Technical aspects: This EEG study was done with  scalp electrodes positioned according to the 10-20 International system of electrode placement. Electrical activity was reviewed with band pass filter of 1-'70Hz'$ , sensitivity of 7 uV/mm, display speed of 61m/sec with a '60Hz'$  notched filter applied as appropriate. EEG data were recorded continuously and digitally stored.  Video monitoring was available and reviewed as appropriate. Description: The posterior dominant rhythm consists of 9 Hz activity of moderate voltage (25-35 uV) seen predominantly in posterior head regions, symmetric and reactive to eye opening and eye closing. EEG showed intermittent generalized 3 to 6 Hz theta-delta slowing. Hyperventilation and photic stimulation were not performed.   ABNORMALITY - Intermittent slow, generalized IMPRESSION: This study is suggestive of mild diffuse encephalopathy, nonspecific etiology. No seizures or epileptiform discharges were seen throughout the recording. PLora Havens  ECHOCARDIOGRAM COMPLETE  Result Date: 12023-11-03   ECHOCARDIOGRAM REPORT   Patient Name:   FALCEE SIPOSDate of Exam: 1November 03, 2021Medical Rec #:  [id number]            Height:       64.0 in Accession #:    [id number]           Weight:       157.0 lb Date of Birth:  [birth year]             BSA:          1.765 m Patient Age:    89years              BP:           143/89 mmHg Patient Gender: M                     HR:           120 bpm. Exam Location:  Inpatient Procedure: 2D Echo, Cardiac Doppler and Color Doppler Indications:    Stroke I63.9  History:  Patient has no prior history of Echocardiogram examinations.  Sonographer:    Bernadene Person RDCS Referring Phys: [id number] [place]  1. Left ventricular ejection fraction, by estimation, is 65 to 70%. The left ventricle has normal function. The left ventricle has no regional wall motion abnormalities. Indeterminate diastolic filling due to E-A fusion.  2. Right ventricular systolic function is normal. The  right ventricular size is normal. There is normal pulmonary artery systolic pressure. The estimated right ventricular systolic pressure is 22.2 mmHg.  3. The mitral valve is grossly normal. Trivial mitral valve regurgitation. No evidence of mitral stenosis.  4. The aortic valve is tricuspid. There is mild calcification of the aortic valve. There is mild thickening of the aortic valve. Aortic valve regurgitation is not visualized. Aortic valve sclerosis is present, with no evidence of aortic valve stenosis.  5. The inferior vena cava is normal in size with greater than 50% respiratory variability, suggesting right atrial pressure of 3 mmHg. Conclusion(s)/Recommendation(s): No intracardiac source of embolism detected on this transthoracic study. Consider a transesophageal echocardiogram to exclude cardiac source of embolism if clinically indicated. FINDINGS  Left Ventricle: Left ventricular ejection fraction, by estimation, is 65 to 70%. The left ventricle has normal function. The left ventricle has no regional wall motion abnormalities. The left ventricular internal cavity size was normal in size. There is  no left ventricular hypertrophy. Indeterminate diastolic filling due to E-A fusion. Right Ventricle: The right ventricular size is normal. No increase in right ventricular wall thickness. Right ventricular systolic function is normal. There is normal pulmonary artery systolic pressure. The tricuspid regurgitant velocity is 2.31 m/s, and  with an assumed right atrial pressure of 3 mmHg, the estimated right ventricular systolic pressure is 97.9 mmHg. Left Atrium: Left atrial size was normal in size. Right Atrium: Right atrial size was normal in size. Pericardium: There is no evidence of pericardial effusion. Mitral Valve: The mitral valve is grossly normal. Trivial mitral valve regurgitation. No evidence of mitral valve stenosis. Tricuspid Valve: The tricuspid valve is grossly normal. Tricuspid valve regurgitation  is mild . No evidence of tricuspid stenosis. Aortic Valve: The aortic valve is tricuspid. There is mild calcification of the aortic valve. There is mild thickening of the aortic valve. Aortic valve regurgitation is not visualized. Aortic valve sclerosis is present, with no evidence of aortic valve stenosis. Pulmonic Valve: The pulmonic valve was grossly normal. Pulmonic valve regurgitation is trivial. No evidence of pulmonic stenosis. Aorta: The aortic root and ascending aorta are structurally normal, with no evidence of dilitation. Venous: The inferior vena cava is normal in size with greater than 50% respiratory variability, suggesting right atrial pressure of 3 mmHg. IAS/Shunts: The atrial septum is grossly normal.  LEFT VENTRICLE PLAX 2D LVIDd:         3.70 cm LVIDs:         2.30 cm LV PW:         0.90 cm LV IVS:        1.10 cm LVOT diam:     2.00 cm LV SV:         26 LV SV Index:   15 LVOT Area:     3.14 cm  LV Volumes (MOD) LV vol d, MOD A2C: 57.8 ml LV vol d, MOD A4C: 70.0 ml LV vol s, MOD A2C: 23.1 ml LV vol s, MOD A4C: 27.8 ml LV SV MOD A2C:     34.7 ml LV SV MOD A4C:  70.0 ml LV SV MOD BP:      36.6 ml RIGHT VENTRICLE RV S prime:     13.20 cm/s TAPSE (M-mode): 2.3 cm LEFT ATRIUM             Index        RIGHT ATRIUM           Index LA diam:        2.40 cm 1.36 cm/m   RA Area:     13.40 cm LA Vol (A2C):   19.4 ml 10.99 ml/m  RA Volume:   28.30 ml  16.03 ml/m LA Vol (A4C):   24.8 ml 14.05 ml/m LA Biplane Vol: 24.1 ml 13.65 ml/m  AORTIC VALVE LVOT Vmax:   69.47 cm/s LVOT Vmean:  44.500 cm/s LVOT VTI:    0.084 m  AORTA Ao Root diam: 3.50 cm Ao Asc diam:  3.50 cm TRICUSPID VALVE TR Peak grad:   21.3 mmHg TR Vmax:        231.00 cm/s  SHUNTS Systemic VTI:  0.08 m Systemic Diam: 2.00 cm Eleonore Chiquito MD Electronically signed by Eleonore Chiquito MD Signature Date/Time: 11/15/2021/9:28:56 AM    Final    MR MRA NECK W CONTRAST  Result Date: 11/15/2021 CLINICAL DATA:  Stroke follow-up EXAM: MRA NECK  WITHOUT AND WITH CONTRAST TECHNIQUE: Multiplanar and multiecho pulse sequences of the neck were obtained without and with intravenous contrast. Angiographic images of the neck were obtained using MRA technique without and with intravenous contrast. CONTRAST:  36m GADAVIST GADOBUTROL 1 MMOL/ML IV SOLN COMPARISON:  None Available. FINDINGS: Aortic arch: Normal 3 vessel branching pattern. Right carotid system: No occlusion or stenosis. Left carotid system: No occlusion or stenosis. Vertebral arteries: Codominant system. No occlusion or stenosis. Both vertebral arteries originate from the subclavian arteries. Normal configuration of the vertebrobasilar confluence. IMPRESSION: Normal MRA neck Electronically Signed   By: KUlyses JarredM.D.   On: 11/15/2021 00:54   DG CHEST PORT 1 VIEW  Result Date: 11/15/2021 CLINICAL DATA:  Encephalopathy. EXAM: PORTABLE CHEST 1 VIEW COMPARISON:  Chest radiograph dated 05/17/2020. FINDINGS: There is left lung base atelectasis or infiltrate. Bilateral perihilar densities may represent mild congestion. No pleural effusion pneumothorax. The cardiac silhouette is within limits. Atherosclerotic calcification of the aorta. No acute osseous pathology. IMPRESSION: Left lung base atelectasis or infiltrate. Electronically Signed   By: AAnner CreteM.D.   On: 11/15/2021 00:02   MR ANGIO HEAD WO CONTRAST  Result Date: 11/14/2021 CLINICAL DATA:  Acute neurologic deficit EXAM: MRA HEAD WITHOUT CONTRAST TECHNIQUE: Angiographic images of the Circle of Willis were acquired using MRA technique without intravenous contrast. COMPARISON:  None Available. FINDINGS: POSTERIOR CIRCULATION: --Vertebral arteries: Normal --Inferior cerebellar arteries: Normal. --Basilar artery: Normal. --Superior cerebellar arteries: Normal. --Posterior cerebral arteries: Normal. ANTERIOR CIRCULATION: --Intracranial internal carotid arteries: Normal. --Anterior cerebral arteries (ACA): Normal. --Middle cerebral  arteries (MCA): Normal. ANATOMIC VARIANTS: None IMPRESSION: Normal intracranial MRA. Electronically Signed   By: KUlyses JarredM.D.   On: 11/14/2021 23:46   MR BRAIN WO CONTRAST  Result Date: 11/14/2021 CLINICAL DATA:  Follow-up questionable pontine lesion seen EXAM: MRI HEAD WITHOUT CONTRAST TECHNIQUE: Multiplanar, multiecho pulse sequences of the brain and surrounding structures were obtained without intravenous contrast. COMPARISON:  Same-day CT head, brain MRI 05/18/2020 FINDINGS: Brain: There is a small focus of diffusion restriction in the right corona radiata with associated FLAIR signal abnormality consistent with small acute infarct. There is no associated hemorrhage or mass effect. There is  no other evidence of acute infarct. Specifically, there is no acute abnormality in the pons. There is no acute intracranial hemorrhage or extra-axial fluid collection. There is unchanged background parenchymal volume loss with prominence of the ventricular system and extra-axial CSF spaces. FLAIR signal abnormality in the supratentorial white matter is nonspecific but likely reflects sequela of chronic small vessel ischemic change, unchanged since the prior MRI from 2022. Punctate chronic microhemorrhages in the right frontal lobe are unchanged (10-46). There is no solid mass lesion. There is no mass effect or midline shift. Vascular: Normal flow voids. Skull and upper cervical spine: Normal marrow signal. Sinuses/Orbits: The paranasal sinuses are clear. Bilateral lens implants are in place. The globes and orbits are otherwise unremarkable. Other: None. IMPRESSION: 1. Small acute infarct in the right corona radiata without hemorrhage or mass effect. 2. No other acute intracranial pathology. No acute finding in the pons as questioned on the prior CT. Electronically Signed   By: Valetta Mole M.D.   On: 11/14/2021 19:42   CT Head Wo Contrast  Result Date: 11/14/2021 CLINICAL DATA:  Altered mental status EXAM: CT  HEAD WITHOUT CONTRAST TECHNIQUE: Contiguous axial images were obtained from the base of the skull through the vertex without intravenous contrast. RADIATION DOSE REDUCTION: This exam was performed according to the departmental dose-optimization program which includes automated exposure control, adjustment of the mA and/or kV according to patient size and/or use of iterative reconstruction technique. COMPARISON:  CT head 05/17/2020 FINDINGS: Brain: No evidence of hemorrhage, extra-axial collection or mass lesion/mass effect. There is ventriculomegaly which is likely related to the degree of volume loss. There is a new focal hypodense lesion in the upper pons (series 2, image 13). Sequela of severe chronic microvascular ischemic change and advanced generalized volume loss. Vascular: No hyperdense vessel or unexpected calcification. Skull: Normal. Negative for fracture or focal lesion. Sinuses/Orbits: Bilateral lens replacement. Other: None. IMPRESSION: New focal hypodense lesion in the ventral pons on the left may be artifactual or represent an age-indeterminate infarct. Recommend MRI for further evaluation. Electronically Signed   By: Marin Roberts M.D.   On: 11/14/2021 16:34       LOS: 0 days   [facility] Pager on [email or web address]  11/16/2021, 11:45 AM

## 2021-11-16 NOTE — Progress Notes (Signed)
Inpatient Rehab Admissions Coordinator:   Per therapy recommendations, patient was screened for CIR candidacy by Clemens Catholic, MS, CCC-SLP. At this time, Pt. Remains OBSERVATION status. May have medical necessity for CIR if acute team feels he meets criteria for inpatient admission. I will not place CIR consult at this time. Please contact me with any questions.   Clemens Catholic, [place], [place] Admissions Coordinator  [phone] ([place]) [phone] (office)

## 2021-11-16 NOTE — Progress Notes (Signed)
Initial Nutrition Assessment  DOCUMENTATION CODES:   Not applicable  INTERVENTION:  - Add Ensure Enlive po BID, each supplement provides 350 kcal and 20 grams of protein.  - Add MVI q day.   NUTRITION DIAGNOSIS:   Inadequate oral intake related to lethargy/confusion as evidenced by meal completion < 50%.  GOAL:   Patient will meet greater than or equal to 90% of their needs  MONITOR:   PO intake, Supplement acceptance  REASON FOR ASSESSMENT:   Malnutrition Screening Tool    ASSESSMENT:   86 y.o. male admits related to pain and confusion. PMH includes: alcohol abuse, GERD, thiamine deficiency, ataxia.  Meds include: Vit B1. Labs reviewed: K low.   Pt was sleeping at time of assessment and oriented x1, per documentation. Pt had a friend at bedside who was able to provide nutrition hx. She reports that the pt has not been eating his usual 3 meals per day since this past Saturday, but prior to that he was eating well. No significant wt loss per record. No intakes recorded at this time. RD will add Ensure BID for now and continue to monitor PO intakes.   NUTRITION - FOCUSED PHYSICAL EXAM:  Unable to perform, pt was sleeping and is currently oriented x1. Will attempt at f/u if appropriate.   Diet Order:   Diet Order             Diet Heart Room service appropriate? Yes; Fluid consistency: Thin  Diet effective now                   EDUCATION NEEDS:   Not appropriate for education at this time  Skin:  Skin Assessment: Reviewed RN Assessment  Last BM:  11/16/21  Height:   Ht Readings from Last 1 Encounters:  11/14/21 '5\' 4"'$  (1.626 m)    Weight:   Wt Readings from Last 1 Encounters:  11/14/21 71.2 kg    Ideal Body Weight:  59.1 kg  BMI:  Body mass index is 26.95 kg/m.  Estimated Nutritional Needs:   Kcal:  3570-1779 kcals  Protein:  90-105 gm  Fluid:  1780-2135 mL  Thalia Bloodgood, RD, LDN, CNSC.

## 2021-11-16 NOTE — Progress Notes (Signed)
   11/16/21 1110  Assess: MEWS Score  Temp 100 F (37.8 C)  BP 127/70  MAP (mmHg) 85  Pulse Rate (!) 121  ECG Heart Rate (!) 120  Resp 18  Level of Consciousness Alert  SpO2 97 %  O2 Device Room Air  Assess: MEWS Score  MEWS Temp 0  MEWS Systolic 0  MEWS Pulse 2  MEWS RR 0  MEWS LOC 0  MEWS Score 2  MEWS Score Color Yellow  Assess: if the MEWS score is Yellow or Red  Were vital signs taken at a resting state? Yes  Focused Assessment No change from prior assessment  Does the patient meet 2 or more of the SIRS criteria? Yes  Does the patient have a confirmed or suspected source of infection? No  MEWS guidelines implemented *See Row Information* Yes  Treat  MEWS Interventions Administered prn meds/treatments;Escalated (See documentation below)  Pain Scale 0-10  Pain Score 5  Pain Type Acute pain  Pain Location Elbow  Pain Orientation Right  Pain Frequency Constant  Pain Onset With Activity  Pain Intervention(s) Medication (See eMAR)  Take Vital Signs  Increase Vital Sign Frequency  Yellow: Q 2hr X 2 then Q 4hr X 2, if remains yellow, continue Q 4hrs  Escalate  MEWS: Escalate Yellow: discuss with charge nurse/RN and consider discussing with provider and RRT  Notify: Charge Nurse/RN  Name of Charge Nurse/RN Notified Maggie Chrismon RN  Date Charge Nurse/RN Notified 11/16/21  Time Charge Nurse/RN Notified 1110  Notify: Provider  Provider Name/Title MD [place] Pink  Date Provider Notified 11/16/21  Time Provider Notified 1119  Method of Notification Page  Notification Reason Change in status  Provider response Other (Comment) (PRN medication for pain administered.)  Date of Provider Response 11/16/21  Time of Provider Response 1120  Document  Patient Outcome Stabilized after interventions  Progress note created (see row info) Yes  Assess: SIRS CRITERIA  SIRS Temperature  0  SIRS Pulse 1  SIRS Respirations  0  SIRS WBC 1  SIRS Score Sum  2

## 2021-11-16 NOTE — Progress Notes (Signed)
NIHSS assessment

## 2021-11-16 NOTE — Plan of Care (Signed)

## 2021-11-17 ENCOUNTER — Inpatient Hospital Stay (HOSPITAL_COMMUNITY): Payer: Medicare Other

## 2021-11-17 DIAGNOSIS — E519 Thiamine deficiency, unspecified: Secondary | ICD-10-CM | POA: Diagnosis not present

## 2021-11-17 DIAGNOSIS — M25521 Pain in right elbow: Secondary | ICD-10-CM

## 2021-11-17 DIAGNOSIS — I639 Cerebral infarction, unspecified: Secondary | ICD-10-CM | POA: Diagnosis not present

## 2021-11-17 LAB — BASIC METABOLIC PANEL
Anion gap: 15 (ref 5–15)
BUN: 11 mg/dL (ref 8–23)
CO2: 21 mmol/L — ABNORMAL LOW (ref 22–32)
Calcium: 9.2 mg/dL (ref 8.9–10.3)
Chloride: 96 mmol/L — ABNORMAL LOW (ref 98–111)
Creatinine, Ser: 0.75 mg/dL (ref 0.61–1.24)
GFR, Estimated: >60 mL/min (ref >60)
Glucose, Bld: 86 mg/dL (ref 70–99)
Potassium: 3.5 mmol/L (ref 3.5–5.1)
Sodium: 132 mmol/L — ABNORMAL LOW (ref 135–145)

## 2021-11-17 LAB — SYNOVIAL CELL COUNT + DIFF, W/ CRYSTALS
Lymphocytes-Synovial Fld: 3 % (ref 0–20)
Monocyte-Macrophage-Synovial Fluid: 12 % — ABNORMAL LOW (ref 50–90)
Neutrophil, Synovial: 85 % — ABNORMAL HIGH (ref 0–25)
WBC, Synovial: 27300 /mm3 — ABNORMAL HIGH (ref 0–200)

## 2021-11-17 LAB — SEDIMENTATION RATE: Sed Rate: 106 mm/hr — ABNORMAL HIGH (ref 0–16)

## 2021-11-17 LAB — URIC ACID: Uric Acid, Serum: 4.5 mg/dL (ref 3.7–8.6)

## 2021-11-17 LAB — MAGNESIUM: Magnesium: 1.9 mg/dL (ref 1.7–2.4)

## 2021-11-17 LAB — C-REACTIVE PROTEIN: CRP: 24.1 mg/dL — ABNORMAL HIGH (ref <1.0)

## 2021-11-17 MED ORDER — POTASSIUM CHLORIDE CRYS ER 20 MEQ PO TBCR
40.0000 meq | EXTENDED_RELEASE_TABLET | Freq: Once | ORAL | Status: AC
  Administered 2021-11-17: 40 meq via ORAL
  Filled 2021-11-17: qty 2

## 2021-11-17 MED ORDER — THIAMINE MONONITRATE 100 MG PO TABS: 100.0000 mg | ORAL_TABLET | Freq: Every day | ORAL | Status: DC

## 2021-11-17 MED ORDER — DOXYCYCLINE HYCLATE 100 MG PO TABS
100.0000 mg | ORAL_TABLET | Freq: Two times a day (BID) | ORAL | Status: DC
  Administered 2021-11-17 – 2021-11-20 (×7): 100 mg via ORAL
  Filled 2021-11-17 (×7): qty 1

## 2021-11-17 MED ORDER — THIAMINE MONONITRATE 100 MG PO TABS
250.0000 mg | ORAL_TABLET | Freq: Every day | ORAL | Status: DC
  Administered 2021-11-18 – 2021-11-20 (×3): 250 mg via ORAL
  Filled 2021-11-17 (×3): qty 3

## 2021-11-17 MED ORDER — TRAMADOL HCL 50 MG PO TABS
50.0000 mg | ORAL_TABLET | Freq: Four times a day (QID) | ORAL | Status: DC | PRN
  Administered 2021-11-17 (×3): 50 mg via ORAL
  Filled 2021-11-17 (×3): qty 1

## 2021-11-17 NOTE — Progress Notes (Signed)
Inpatient Rehabilitation Admissions Coordinator   I met with patient at bedside. He remains quite confused. Likely will need SNF rehab. We will follow from a distance.  Danne Baxter, RN, MSN Rehab Admissions Coordinator [phone] 11/17/2021 12:01 PM

## 2021-11-17 NOTE — Consult Note (Addendum)
Orthopedic Surgery Consult Note  Assessment: Patient is a 86 y.o. male with possible right elbow septic arthritis   Plan: -Operative plans: pending aspirate results -Keep NPO at midnight in the event aspirate is consistent with septic arthritis -Okay for dvt ppx from ortho perspective -Antibiotics: per primary -Weight bearing status: as tolerated -PT/OT evaluate and treat -Pain control  Left elbow aspirate procedure note: Discussed the fact that patient's history and exam are concerning for septic arthritis in the right elbow. Recommended aspiration for diagnostic purposes. Discussed risks/benefits and afterwards patient consented to proceed with aspiration. The right elbow was flexed and resting comfortably on a pillow. The right elbow was sterilized with chlorhexidine. An 18 gauge needle was used to access the joint via an anconeus approach - there was no erythema in that area. 4cc of cloudy, red fluid was aspirated from the joint under sterile technique. The needle was withdrawn. Pressure was applied to the aspiration site until bleeding stopped. The sample was placed in a biohazard bag and sent to the microbiology lab. The patient tolerated the procedure well.   ___________________________________________________________________________   Reason for consult: left elbow pain, concern for septic arthritis  History:  Patient is a 86 y.o. male who is admitted to medicine for encephalopathy and CVA. Ortho was consulted for possible right elbow septic arthritis Pain location: right elbow, does not have pain in other joints Severity of pain: severe Mechanism of injury: none Duration of symptoms: patient reports about 1 week Paresthesias and numbness: denies any in the right upper extremity  Past medical history:  Alcohol abuse Encephalopathy Thiamine deficiency IBS GERD  Past surgeries on the elbow: none   Physical Exam:  General: no acute distress, appears stated  age Neurologic: alert, answering questions appropriately, following commands Cardiovascular: regular rate, no cyanosis Respiratory: unlabored breathing on room air, symmetric chest rise Psychiatric: appropriate affect, normal cadence to speech  MSK:   -Bilateral upper extremities  No tenderness to palpation over extremity, except right elbow  Right elbow is swollen, there are aspects that are slightly more erythematous than the surrounding skin  Can do about 30 degrees of range of motion passively in the right elbow before significant pain and grimacing  No pain through passive range of motion at shoulder or wrist bilaterally. No pain through passive range of motion at left elbow.  Fires deltoid, biceps, triceps, wrist extensors, wrist flexors, finger extensors, finger flexors  AIN/PIN/IO intact  Palpable radial pulse  Sensation intact to light touch in median/ulnar/radial/axillary nerve distributions  Hand warm and well perfused  -Bilateral lower extremities  No tenderness to palpation over extremity, no joint swelling noted  No pain through log roll, passive knee and ankle range of motion Fires hip flexors, quadriceps, hamstrings, tibialis anterior, gastrocnemius and soleus, extensor hallucis longus Plantarflexes and dorsiflexes toes Sensation intact to light touch in sural, saphenous, tibial, deep peroneal, and superficial peroneal nerve distributions Foot warm and well perfused  ESR is 106, CRP is 24.1, WBC is 11.9  Imaging: XR and CT of the right elbow from 11/16/2021 and 11/17/2021 was independently reviewed and interpreted, showing no acute fracture. Degenerative changes with joint space narrowing and osteophyte formation.    Patient name: Jose [name] Patient MRN: [id number] Date: 11/17/21
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
## 2021-11-17 NOTE — Plan of Care (Signed)
Neurology Results Review and Plan of care  Please see full neurology consult note from Dr. Lorrin Goodell 10/24. Jose [name] is a 86 y.o. male with hx of EtOh use and quit EtOH again about a week ago who presents with increased confusion, tremulousness along with tachycardia. Neurology was consulted regarding small punctate acute R corona radiata stroke (favored to be incidental, would not explain current presentation). AMS favored to be 2/2 toxic metabolic encephalopathy, potentially in the setting of acute EtOH withdrawal  Results review  MRA head and neck: WNL TTE: normal LVEF, no intracardiac clot, no other sig abnl  Stroke Labs     Component Value Date/Time   CHOL 126 11/15/2021 0522   TRIG 60 11/15/2021 0522   HDL 43 11/15/2021 0522   CHOLHDL 2.9 11/15/2021 0522   VLDL 12 11/15/2021 0522   LDLCALC 71 11/15/2021 0522   LDLDIRECT 122.0 07/18/2021 1337    Lab Results  Component Value Date/Time   HGBA1C 5.3 11/15/2021 05:22 AM   B12, folate, TSH WNL B1 pending  Stroke workup is now completed.  Final recommendations - Goal normotension, avoid hypotension - Continue ASA '81mg'$  daily - Continue atorvastatin '40mg'$  daily - Tele - PT/OT/SLP - Stroke education - Toxic/metabolic/infectious workup per primary team - Continue Wernicke dosing regimen for thiamine until vit B1 level results - Patient may f/u with PCP after discharge  No further specific inpatient neurologic workup indicated at this time. Neurology tos ign off, but please re-engage if additional neurologic concerns arise.  Su Monks, MD [facility] [phone]  If 7pm- 7am, please page neurology on call as listed in [facility].

## 2021-11-17 NOTE — Progress Notes (Signed)
Occupational Therapy Treatment Patient Details Name: Jose [name] MRN: [id number] DOB: 1935/06/01 Today's Date: 11/17/2021   History of present illness Jose [name] is a 86 y.o. male has been more confused over the last few days. He quit alcohol a few months ago but then relapsed and then quit again a week ago. He has been more confused, shaking with body aches over the last few days. CTH with some concern for a L pontine stroke. However, MRI Brain demonstrated a small punctate R corona radiata stroke. PMH significant for EtOH use, GERD, prior hx of thiamine deficieincy, OSA, HLD.   OT comments  Pt progressing towards established OT goals. Pt presenting with decreased orientation, problem solving, balance, strength, attention, safety, and endurance. Pt requiring increased time and mod-max cues for all mobility. Observed with poor motor planning during bed mobility and transfers, requiring mod-max multimodal cues for safety and sequencing. Pt requiring mod A for sitting balance EOB initially, but progressing to min guard EOB and on BSC. Pt with poor awareness, requiring close guard throughout session. Updating d/c recommendation to SNF as pt denied AIR.    Recommendations for follow up therapy are one component of a multi-disciplinary discharge planning process, led by the attending physician.  Recommendations may be updated based on patient status, additional functional criteria and insurance authorization.    Follow Up Recommendations  Skilled nursing-short term rehab (<3 hours/day)    Assistance Recommended at Discharge Frequent or constant Supervision/Assistance  Patient can return home with the following  Two people to help with walking and/or transfers;A lot of help with bathing/dressing/bathroom;Assist for transportation;Assistance with cooking/housework;Help with stairs or ramp for entrance;Direct supervision/assist for financial management;Direct supervision/assist for  medications management   Equipment Recommendations  Other (comment) (defer to next venue)    Recommendations for Other Services      Precautions / Restrictions Precautions Precautions: Fall Precaution Comments: permissive HTN, Rt shoulder and elbow pain Restrictions Weight Bearing Restrictions: No       Mobility Bed Mobility Overal bed mobility: Needs Assistance Bed Mobility: Supine to Sit, Sit to Supine       Sit to supine: Mod assist, HOB elevated Sit to sidelying: Mod assist, +2 for safety/equipment General bed mobility comments: up to sit EOB with assist for scooting hips and lifting trunk with much increased time and cues; assist to supine for each leg onto bed and lowering shoulders with cues and time    Transfers Overall transfer level: Needs assistance Equipment used: Rolling walker (2 wheels) Transfers: Sit to/from Stand, Bed to chair/wheelchair/BSC Sit to Stand: Min assist, Mod assist, +2 physical assistance     Step pivot transfers: Mod assist, +2 physical assistance, Max assist     General transfer comment: assist up to standing with min A initially with elevated height of bed and cues for anterior weight shift ; step to [facility] with +2 mod A for walker management and proximity and cues for posture; stood from [facility] with some lifting assist to RW and stepping to bed wtih much increased time, mod cues and assist for RW, cues for posture, etc     Balance Overall balance assessment: Needs assistance Sitting-balance support: Feet supported Sitting balance-Leahy Scale: Poor Sitting balance - Comments: mod A for posterior bias on EOB, after on BSC leaning forward to have BM and minguard for safety due to poor safety awareness   Standing balance support: Bilateral upper extremity supported Standing balance-Leahy Scale: Poor Standing balance comment: +2 for safety static standing with  RW posterior bias initially                           ADL either  performed or assessed with clinical judgement   ADL Overall ADL's : Needs assistance/impaired                         Toilet Transfer: Moderate assistance;+2 for physical assistance;Rolling walker (2 wheels);BSC/3in1 Toilet Transfer Details (indicate cue type and reason): Initially Min A for rise from low bed surface, but ultimately mod A to coordinate SPT to [facility], and mod A to rise from [facility]. Required min guard sitting on toilet due to poor safety awareness and decreased trunk strength/control Toileting- Clothing Manipulation and Hygiene: Total assistance;Sit to/from stand Toileting - Clothing Manipulation Details (indicate cue type and reason): Total A for posterior pericare.     Functional mobility during ADLs: Moderate assistance;+2 for physical assistance;Rolling walker (2 wheels)      Extremity/Trunk Assessment Upper Extremity Assessment Upper Extremity Assessment: RUE deficits/detail RUE Deficits / Details: Going for MRI of R elbow today   Lower Extremity Assessment Lower Extremity Assessment: Defer to PT evaluation        Vision   Vision Assessment?: No apparent visual deficits   Perception     Praxis      Cognition Arousal/Alertness: Awake/alert Behavior During Therapy: Anxious Overall Cognitive Status: Impaired/Different from baseline Area of Impairment: Orientation, Memory, Following commands, Safety/judgement, Awareness, Problem solving                 Orientation Level: Disoriented to, Time, Place, Situation (Inconsistent reports of having wife or girlfriend who he also believes is where he is. Max difficulty stating where he is throughout session, inconsistently stating location.)   Memory: Decreased short-term memory Following Commands: Follows one step commands inconsistently, Follows one step commands with increased time Safety/Judgement: Decreased awareness of safety, Decreased awareness of deficits Awareness: Intellectual Problem Solving:  Slow processing, Decreased initiation, Requires verbal cues, Requires tactile cues General Comments: Poor awareness, significantly increased time for sequencing. Difficulty with motor planning. Repetitively asking "what is it you want me to do". Awareness of urge to use restroom, unaware he was already actively going.        Exercises      Shoulder Instructions       General Comments diaphoretic with step pivot transfer to [facility]    Pertinent Vitals/ Pain       Pain Assessment Pain Assessment: Faces Faces Pain Scale: Hurts even more Pain Location: generalized "Hurt everywhere"; L elbow Pain Descriptors / Indicators: Grimacing, Guarding, Moaning Pain Intervention(s): Limited activity within patient's tolerance, Monitored during session  Home Living                                          Prior Functioning/Environment              Frequency  Min 2X/week        Progress Toward Goals  OT Goals(current goals can now be found in the care plan section)  Progress towards OT goals: Progressing toward goals  Acute Rehab OT Goals Patient Stated Goal: Go to bathroom OT Goal Formulation: With patient Time For Goal Achievement: 11/29/21 Potential to Achieve Goals: Fair ADL Goals Pt Will Perform Upper Body Bathing: with modified independence;sitting Pt Will Perform Lower  Body Bathing: with modified independence;sitting/lateral leans;sit to/from stand;with adaptive equipment Pt Will Perform Upper Body Dressing: with modified independence;with adaptive equipment;sitting Pt Will Perform Lower Body Dressing: with modified independence;with adaptive equipment;sitting/lateral leans;sit to/from stand Pt Will Transfer to Toilet: with modified independence;ambulating Pt Will Perform Toileting - Clothing Manipulation and hygiene: with modified independence;with adaptive equipment;sitting/lateral leans;sit to/from stand Additional ADL Goal #1: Pt will demonstrate improved  mentation by scoring <4/10 on short blessed test and answering 4/4 safety questions from the [facility] correctly:   1. What do you do for yourself if you are sick with a cold.    2. What do you do if you burn yourself and the wound becomes infected.    3. What do you do if you experience severe chest pain and shortness of breath?   4. What number do you call in an emergency?  Plan Discharge plan needs to be updated;Frequency needs to be updated    Co-evaluation    PT/OT/SLP Co-Evaluation/Treatment: Yes Reason for Co-Treatment: Necessary to address cognition/behavior during functional activity;For patient/therapist safety;To address functional/ADL transfers PT goals addressed during session: Mobility/safety with mobility;Balance;Proper use of DME OT goals addressed during session: ADL's and self-care;Proper use of Adaptive equipment and DME      AM-PAC OT "6 Clicks" Daily Activity     Outcome Measure   Help from another person eating meals?: A Little Help from another person taking care of personal grooming?: A Little Help from another person toileting, which includes using toliet, bedpan, or urinal?: A Lot Help from another person bathing (including washing, rinsing, drying)?: A Lot Help from another person to put on and taking off regular upper body clothing?: A Lot Help from another person to put on and taking off regular lower body clothing?: Total 6 Click Score: 13    End of Session Equipment Utilized During Treatment: Gait belt;Rolling walker (2 wheels)  OT Visit Diagnosis: Unsteadiness on feet (R26.81);Other symptoms and signs involving cognitive function;Pain;Muscle weakness (generalized) (M62.81) Pain - Right/Left: Right Pain - part of body: Shoulder;Arm   Activity Tolerance Patient limited by pain;Patient tolerated treatment well   Patient Left in bed;with call bell/phone within reach;with bed alarm set   Nurse Communication Mobility status        Time: 2956-2130 OT Time  Calculation (min): 32 min  Charges: OT General Charges $OT Visit: 1 Visit OT Treatments $Self Care/Home Management : 8-22 mins  Shanda Howells, OTR/L [facility] Acute Rehabilitation Office: [phone]   Lula Olszewski 11/17/2021, 2:08 PM

## 2021-11-17 NOTE — Progress Notes (Signed)
Patient pulled IV out. Dr. [place] Pink aware. OK'd for patient to have No IV access at this time.

## 2021-11-17 NOTE — Evaluation (Signed)
Speech Language Pathology Evaluation Patient Details Name: Jose [name] MRN: [id number] DOB: Apr 08, 1935 Today's Date: 11/17/2021 Time: 0910-0940 SLP Time Calculation (min) (ACUTE ONLY): 30 min  Problem List:  Patient Active Problem List   Diagnosis Date Noted   Acute CVA (cerebrovascular accident) ([place]) 11/14/2021   Acute encephalopathy 11/14/2021   Acute back pain 10/26/2021   Urinary frequency 10/26/2021   Confusion 08/23/2021   Weakness 08/23/2021   Alcohol abuse 08/23/2021   Insomnia 07/18/2021   Chronic fatigue syndrome 11/19/2020   Arthritis 11/19/2020   GERD (gastroesophageal reflux disease) 11/19/2020   Ataxia 05/18/2020   Thiamine deficiency 05/18/2020   Past Medical History:  Past Medical History:  Diagnosis Date   Acute meniscal tear of knee    RIGHT KNEE   Arthritis    Hives    PER PT UNKNOWN WHY   IBS (irritable bowel syndrome)    Mild sleep apnea    PER STUDY  11-30-2010--  NO CPAP   Spinal stenosis    Past Surgical History:  Past Surgical History:  Procedure Laterality Date   APPENDECTOMY     KNEE ARTHROSCOPY WITH LATERAL MENISECTOMY Right 04/12/2012   Procedure: RIGHT KNEE ARTHROSCOPY WITH PARTIAL MEDIAL AND LATERAL MENISECTOMY;  Surgeon: Jose Sinning, MD;  Location: [facility];  Service: Orthopedics;  Laterality: Right;   SHOULDER ARTHROSCOPY WITH ROTATOR CUFF REPAIR AND SUBACROMIAL DECOMPRESSION  12-21-1999  &  01-28-2004   HPI:  Jose [name] is a 86 y.o. male with medical history significant of alcohol use, GERD, thiamine deficiency, ataxia presentied on 11/14/21 with pain.     Initially presented for ongoing generalized pain.  With additional information from friend EDP was able to determine the patient had stopped drinking 4 to 5 days ago.  Also that he has years of issues with his balance and weakness however he has had significant increase in his confusion and weakness in the last 4 to 5 days (patient initially  stated he did not know when he quit in the ED, on my exam he states that his girlfriend is making it up and that it was 4 to 5 weeks ago);MRI head 11/14/21 revealed small acute infarct in R corona radiata.  SLE generated to assess speech,language and cognition.   Assessment / Plan / Recommendation Clinical Impression  Pt seen for speech/language, cognitive assessment with portions of [place] Mental Status Examination (SLUMS) with a score unable to be attained d/t pt refusal/overall confusion.  Pt with intelligible speech within conversation, but language of confusion observed.  Pt would answer personal questions accurately, but other tasks affected by inattention/impaired memory recall and decreased intellectual/anticipatory awareness impacting overall performance.  Pt shared statements such as "I can't think very fast" and "I can't answer, it's too much work" during assessment with refusal of various tasks such as digit reversal/repetition, auditory comprehension for paragraph (0% recalled accurately), and simple calculation.  Pt oriented to self and place (hospital), but disoriented to situation or time.  Pt became frustrated with questions and exhibited refusal/avoidance during task completion, despite min-mod verbal cues/encouragement/redirection.   No family available to determine baseline cognitive function.  ST recommended in acute setting for ongoing cognitive assessment and remediation of above deficits.  Thank you for this consult.    SLP Assessment  SLP Recommendation/Assessment: Patient needs continued Speech Language Pathology Services SLP Visit Diagnosis: Attention and concentration deficit;Cognitive communication deficit (R41.841) Attention and concentration deficit following: Cerebral infarction    Recommendations for follow up  therapy are one component of a multi-disciplinary discharge planning process, led by the attending physician.  Recommendations may be updated based on  patient status, additional functional criteria and insurance authorization.    Follow Up Recommendations  Follow physician's recommendations for discharge plan and follow up therapies    Assistance Recommended at Discharge  Frequent or constant Supervision/Assistance  Functional Status Assessment Patient has had a recent decline in their functional status and demonstrates the ability to make significant improvements in function in a reasonable and predictable amount of time.  Frequency and Duration min 2x/week  1 week      SLP Evaluation Cognition  Overall Cognitive Status: Impaired/Different from baseline Arousal/Alertness: Awake/alert Orientation Level: Oriented to person;Oriented to place;Oriented to situation;Disoriented to time Year: 2023 Month: August [name] of Week: Incorrect Attention: Sustained Sustained Attention: Impaired Sustained Attention Impairment: Verbal basic;Functional basic Memory: Impaired Memory Impairment: Retrieval deficit;Decreased recall of new information;Decreased short term memory Decreased Short Term Memory: Verbal basic;Functional basic Awareness: Impaired Awareness Impairment: Intellectual impairment;Anticipatory impairment Problem Solving: Impaired Problem Solving Impairment: Verbal basic;Functional basic Executive Function: Organizing;Reasoning Reasoning: Impaired Reasoning Impairment: Verbal basic;Functional basic Organizing: Impaired Organizing Impairment: Verbal basic;Functional basic Behaviors: Perseveration;Verbal agitation Safety/Judgment: Impaired Comments: Pt pulled IV out of arm prior to SLP arrival       Comprehension  Auditory Comprehension Overall Auditory Comprehension: Impaired Yes/No Questions: Within Functional Limits Commands: Impaired Multistep Basic Commands: 0-24% accurate Conversation: Simple Interfering Components: Attention;Working Field seismologist: Repetition Doctor, hospital: Not tested Reading Comprehension Reading Status: Not tested (pt refused)    Expression Expression Primary Mode of Expression: Verbal Verbal Expression Overall Verbal Expression: Impaired Level of Generative/Spontaneous Verbalization: Conversation Repetition:  (unable to assess d/t inattention to task) Naming: Not tested Pragmatics: Unable to assess Interfering Components: Attention;Premorbid deficit Non-Verbal Means of Communication: Not applicable Other Verbal Expression Comments: verbose, language of confusion Written Expression Dominant Hand: Right Written Expression: Unable to assess (comment) (pt refusal)   Oral / Motor  Oral Motor/Sensory Function Overall Oral Motor/Sensory Function: Within functional limits (grossly within normal limits) Motor Speech Overall Motor Speech: Appears within functional limits for tasks assessed Respiration: Within functional limits Phonation: Normal Resonance: Within functional limits Articulation: Within functional limitis Intelligibility: Intelligible Motor Planning: Witnin functional limits Motor Speech Errors: Not applicable            Elvina Sidle, M.S., CCC-SLP 11/17/2021, 10:03 AM

## 2021-11-17 NOTE — Progress Notes (Signed)
Physical Therapy Treatment Patient Details Name: Jose [name] MRN: [id number] DOB: 06-18-1935 Today's Date: 11/17/2021   History of Present Illness Jose [name] is a 86 y.o. male has been more confused over the last few days. He quit alcohol a few months ago but then relapsed and then quit again a week ago. He has been more confused, shaking with body aches over the last few days. CTH with some concern for a L pontine stroke. However, MRI Brain demonstrated a small punctate R corona radiata stroke. PMH significant for EtOH use, GERD, prior hx of thiamine deficieincy, OSA, HLD.    PT Comments    Patient tolerating mobility better with pre-med provided today.  Patient able to transfer to [facility] with A of 2 and back to bed.  Demonstrating signs of motor apraxia with standing and transfers needing cues and A for safety pt pushing RW too far forward and tending to initiate turning the wrong way for bed to [facility].  Patient denied AIR so recommending STSNF level rehab at d/c.  PT will continue to follow acutely.    Recommendations for follow up therapy are one component of a multi-disciplinary discharge planning process, led by the attending physician.  Recommendations may be updated based on patient status, additional functional criteria and insurance authorization.  Follow Up Recommendations  Skilled nursing-short term rehab (<3 hours/day) Can patient physically be transported by private vehicle: No   Assistance Recommended at Discharge Frequent or constant Supervision/Assistance  Patient can return home with the following Two people to help with bathing/dressing/bathroom;Two people to help with walking and/or transfers;Direct supervision/assist for medications management;Assistance with cooking/housework;Assist for transportation;Help with stairs or ramp for entrance   Equipment Recommendations  Other (comment) (TBA)    Recommendations for Other Services       Precautions /  Restrictions Precautions Precautions: Fall Precaution Comments: permissive HTN, Rt shoulder and elbow pain Restrictions Weight Bearing Restrictions: No     Mobility  Bed Mobility Overal bed mobility: Needs Assistance Bed Mobility: Supine to Sit, Sit to Supine       Sit to supine: Mod assist, HOB elevated Sit to sidelying: Mod assist, +2 for safety/equipment General bed mobility comments: up to sit EOB with assist for scooting hips and lifting trunk with much increased time and cues; assist to supine for each leg onto bed and lowering shoulders with cues and time    Transfers Overall transfer level: Needs assistance Equipment used: Rolling walker (2 wheels) Transfers: Sit to/from Stand, Bed to chair/wheelchair/BSC Sit to Stand: Min assist, Mod assist, +2 physical assistance   Step pivot transfers: Mod assist, +2 physical assistance, Max assist       General transfer comment: assist up to standing with min A initially with elevated height of bed and cues for anterior weight shift ; step to [facility] with +2 mod A for walker management and proximity and cues for posture; stood from [facility] with some lifting assist to RW and stepping to bed wtih much increased time, mod cues and assist for RW, cues for posture, etc    Ambulation/Gait               General Gait Details: unable   Stairs             Wheelchair Mobility    Modified Rankin (Stroke Patients Only) Modified Rankin (Stroke Patients Only) Pre-Morbid Rankin Score: Moderate disability Modified Rankin: Severe disability     Balance Overall balance assessment: Needs assistance Sitting-balance support: Feet supported  Sitting balance-Leahy Scale: Poor Sitting balance - Comments: mod A for posterior bias on EOB, after on BSC leaning forward to have BM and minguard for safety due to poor safety awareness   Standing balance support: Bilateral upper extremity supported Standing balance-Leahy Scale: Poor Standing  balance comment: +2 for safety static standing with RW posterior bias initially                            Cognition Arousal/Alertness: Awake/alert Behavior During Therapy: Anxious Overall Cognitive Status: Impaired/Different from baseline Area of Impairment: Orientation, Memory, Following commands, Safety/judgement, Awareness, Problem solving                 Orientation Level: Disoriented to, Time, Place, Situation (Stated he was at a motel)   Memory: Decreased short-term memory Following Commands: Follows one step commands inconsistently, Follows one step commands with increased time Safety/Judgement: Decreased awareness of safety, Decreased awareness of deficits Awareness: Intellectual Problem Solving: Slow processing, Decreased initiation, Requires verbal cues, Requires tactile cues          Exercises      General Comments General comments (skin integrity, edema, etc.): Patient able to have BM on BSC but leaning so far forward and sitting so far back on seat had to clean off the seat.  Some diaphoresis noted with stand step to [facility]      Pertinent Vitals/Pain Pain Assessment Faces Pain Scale: Hurts even more Pain Location: back and L elbow Pain Descriptors / Indicators: Grimacing, Guarding, Moaning Pain Intervention(s): Monitored during session, Premedicated before session, Limited activity within patient's tolerance    Home Living                          Prior Function            PT Goals (current goals can now be found in the care plan section) Progress towards PT goals: Progressing toward goals    Frequency    Min 3X/week      PT Plan Discharge plan needs to be updated;Frequency needs to be updated    Co-evaluation PT/OT/SLP Co-Evaluation/Treatment: Yes Reason for Co-Treatment: Necessary to address cognition/behavior during functional activity;For patient/therapist safety;To address functional/ADL transfers PT goals addressed  during session: Mobility/safety with mobility;Balance;Proper use of DME OT goals addressed during session: ADL's and self-care;Proper use of Adaptive equipment and DME      AM-PAC PT "6 Clicks" Mobility   Outcome Measure  Help needed turning from your back to your side while in a flat bed without using bedrails?: Total Help needed moving from lying on your back to sitting on the side of a flat bed without using bedrails?: Total Help needed moving to and from a bed to a chair (including a wheelchair)?: Total Help needed standing up from a chair using your arms (e.g., wheelchair or bedside chair)?: Total Help needed to walk in hospital room?: Total Help needed climbing 3-5 steps with a railing? : Total 6 Click Score: 6    End of Session Equipment Utilized During Treatment: Gait belt Activity Tolerance: Patient limited by fatigue Patient left: in bed;with call bell/phone within reach;with bed alarm set Nurse Communication: Mobility status PT Visit Diagnosis: Unsteadiness on feet (R26.81);Muscle weakness (generalized) (M62.81);Difficulty in walking, not elsewhere classified (R26.2);Other abnormalities of gait and mobility (R26.89)     Time: 1030-1108 PT Time Calculation (min) (ACUTE ONLY): 38 min  Charges:  $Therapeutic Activity: 8-22 mins  Magda Kiel, PT Acute Rehabilitation Services [phone] 11/17/2021    Reginia Naas 11/17/2021, 1:51 PM

## 2021-11-17 NOTE — Progress Notes (Addendum)
[facility] PROGRESS NOTE   Jose [name] GDJ:[id number] DOB: July 29, 1935 DOA: 11/14/2021  PCP: Hoyt Koch, MD  Brief History/Interval Summary: 86 year old Caucasian male with past medical history of alcohol abuse, GERD, thiamine deficiency, ataxia presented with generalized pain.  Found to be encephalopathic.  Work-up revealed acute stroke.  He was transferred to [facility] for further management.  Consultants: Neurology  Procedures: None    Subjective/Interval History: Overall patient mentioned that he feels well.  However he continues to have significant pain in his right elbow.  Mentions that some of his symptoms are longstanding.  He mentioned that he used to use a lot of hand-held firearms as a hobby.  As a result he developed elbow pain.  Otherwise he denies any other complaint such as nausea vomiting.    Assessment/Plan:  Acute metabolic encephalopathy Apparently has had increased confusion and weakness for several days prior to admission.  There was concern for significant thiamine deficiency/Warnicke's.   He was started on high-dose thiamine in the emergency department.  Seen by neurology.  Underwent MRI which showed acute stroke.  See below. B12 level 923, folate 17.6, TSH 2.5. WBC was noted to be elevated.  No evidence of infection on UA.  Chest x-ray more consistent with atelectasis rather than pneumonia.  Procalcitonin level was 0.65.  Lactic acid was normal. Reorient daily.  Acute CVA Acute infarct in the right corona radiata noted on MRI.   Neurology recommends aspirin and statin daily.   LDL 71.  HbA1c 5.3.  Echocardiogram shows normal systolic function. EEG did not show any epileptiform activity. PT and OT evaluation.  Inpatient rehabilitation is recommended. Patient on aspirin and statin.  Right elbow pain Swelling of the right elbow area around with some erythema and warmth noted.  X-ray was done which did not show any  obvious fractures.  Patient is quite tender in that area.  Could have bursitis.  Might benefit from further delineation of the anatomy.  We will proceed with CT scan of the elbow. Uric acid level noted to be normal at 4.5.  Will check ESR and CRP as well. His WBC was mildly elevated.  Patient started on doxycycline.   May need further management based on results of CT scan. I spoke to Dr. Laurance Flatten with [facility] who will see this patient later today and consider arthrocentesis.  Normal anion gap metabolic acidosis Stable.  Hypokalemia/hyponatremia Will be repleted.  Magnesium is 1.9.  Monitor sodium levels.  History of alcohol use Was drinking up until 4 to 5 days prior to admission.  No evidence for withdrawal.   Thiamine deficiency Thiamine level was low in June at 47.  Repeat level is pending.  Continue high-dose thiamine.  DVT Prophylaxis: Lovenox Code Status: Full code Family Communication: No family at bedside Disposition Plan: Inpatient rehabilitation is recommended by physical therapy.  Status is: Inpatient Remains inpatient appropriate because: Acute stroke   Medications: Scheduled:  aspirin EC  81 mg Oral Daily   atorvastatin  40 mg Oral Daily   doxycycline  100 mg Oral Q12H   enoxaparin (LOVENOX) injection  40 mg Subcutaneous Q24H   feeding supplement  237 mL Oral BID BM   multivitamin with minerals  1 tablet Oral Daily   pantoprazole  40 mg Oral Daily   [START ON 11/23/2021] thiamine (VITAMIN B1) injection  100 mg Intravenous Daily   Continuous:  thiamine (VITAMIN B1) injection 250 mg (11/17/21 0837)   QQI:WLNLGXQJJHERD **OR** acetaminophen (TYLENOL) oral  liquid 160 mg/5 mL **OR** acetaminophen, senna-docusate, traMADol  Antibiotics: Anti-infectives (From admission, onward)    Start     Dose/Rate Route Frequency Ordered Stop   11/17/21 1000  doxycycline (VIBRA-TABS) tablet 100 mg        100 mg Oral Every 12 hours 11/17/21 0841         Objective:  Vital  Signs  Vitals:   11/17/21 0000 11/17/21 0400 11/17/21 0415 11/17/21 0730  BP: 111/72 (!) 152/90 137/75 121/81  Pulse: (!) 101 (!) 111 (!) 106 90  Resp: _0 Temp: 98.4 F (36.9 C) 98.6 F (37 C) 98.4 F (36.9 C) 98.2 F (36.8 C)  TempSrc: Oral Oral Oral Oral  SpO2:  98%  96%  Weight:      Height:        Intake/Output Summary (Last 24 hours) at 11/17/2021 1005 Last data filed at 11/17/2021 0500 Gross per 24 hour  Intake 983.89 ml  Output 725 ml  Net 258.89 ml    Filed Weights   11/14/21 1134  Weight: 71.2 kg    General appearance: Awake alert.  In no distress.  Distracted Resp: Clear to auscultation bilaterally.  Normal effort Cardio: S1-S2 is normal regular.  No S3-S4.  No rubs murmurs or bruit GI: Abdomen is soft.  Nontender nondistended.  Bowel sounds are present normal.  No masses organomegaly Extremities: Restricted range of motion of the right elbow with swelling at the elbow joint with mild erythema and warmth. Neurologic:   No focal neurological deficits.    Lab Results:  Data Reviewed: I have personally reviewed following labs and reports of the imaging studies  CBC: Recent Labs  Lab 11/14/21 1345 11/15/21 0945 11/16/21 0625  WBC 14.7* 10.8* 11.9*  NEUTROABS 10.4* 7.5 8.1*  HGB 15.9 14.5 13.3  HCT 48.8 43.9 38.0*  MCV 94.4 92.6 90.0  PLT 337 295 276     Basic Metabolic Panel: Recent Labs  Lab 11/14/21 1345 11/15/21 0945 11/16/21 0625 11/17/21 0852  NA 135 135 135 132*  K 4.1 3.7 3.4* 3.5  CL 99 103 101 96*  CO2 22 20* 21* 21*  GLUCOSE 126* 90 95 86  BUN _1 CREATININE 1.04 0.79 0.81 0.75  CALCIUM 9.6 9.2 9.4 9.2  MG  --  2.0 1.9 1.9  PHOS  --  3.4 3.0  --      GFR: Estimated Creatinine Clearance: 62.2 mL/min (by C-G formula based on SCr of 0.75 mg/dL).  Liver Function Tests: Recent Labs  Lab 11/14/21 1345 11/15/21 0945 11/16/21 0625  AST _2 ALT _3 ALKPHOS 63 55 47  BILITOT 1.3* 1.2 1.1   PROT 7.4 6.8 5.9*  ALBUMIN 3.7 3.2* 2.6*     Recent Labs  Lab 11/14/21 1345  LIPASE 39    Recent Labs  Lab 11/14/21 1345  AMMONIA 13       HbA1C: Recent Labs    11/15/21 0522  HGBA1C 5.3      Lipid Profile: Recent Labs    11/15/21 0522  CHOL 126  HDL 43  LDLCALC 71  TRIG 60  CHOLHDL 2.9     Thyroid Function Tests: Recent Labs    11/14/21 2232  TSH 2.504     Anemia Panel: Recent Labs    11/14/21 2232  VITAMINB12 923*  FOLATE 17.6     Recent Results (from the past 240 hour(s))  Urine Culture  Status: None   Collection Time: 11/14/21 10:24 PM   Specimen: Urine, Clean Catch  Result Value Ref Range Status   Specimen Description   Final    URINE, CLEAN CATCH Performed at [facility], [place] [address] [place], [place] [address]    Special Requests   Final    NONE Performed at [facility], [place] [address], [place], [place] [address]    Culture   Final    NO GROWTH Performed at [facility], [place] [address] [place], [place] [address]    Report Status 11/16/2021 FINAL  Final  Culture, blood (Routine X 2) w Reflex to ID Panel     Status: None (Preliminary result)   Collection Time: 11/15/21  9:40 AM   Specimen: BLOOD  Result Value Ref Range Status   Specimen Description   Final    BLOOD BLOOD LEFT WRIST Performed at [place] [address], [place], [place] [address]    Special Requests   Final    BOTTLES DRAWN AEROBIC AND ANAEROBIC Blood Culture adequate volume Performed at [place] [address], [place], [place] [address]    Culture   Final    NO GROWTH 2 DAYS Performed at [place] [address], [place], [place] [address]    Report Status PENDING  Incomplete  Culture, blood (Routine X 2) w Reflex to ID Panel     Status: None (Preliminary result)   Collection Time: 11/15/21  9:45 AM   Specimen: BLOOD  Result Value Ref  Range Status   Specimen Description   Final    BLOOD RIGHT ANTECUBITAL Performed at [place] [address], [place], [place] [address]    Special Requests   Final    BOTTLES DRAWN AEROBIC AND ANAEROBIC Blood Culture adequate volume Performed at [place] [address], [place], [place] [address]    Culture   Final    NO GROWTH 2 DAYS Performed at [place] [address] [place],  [address]    Report Status PENDING  Incomplete      Radiology Studies: DG Elbow 2 Views Right  Result Date: 11/16/2021 CLINICAL DATA:  Right elbow pain. EXAM: RIGHT ELBOW - 2 VIEW COMPARISON:  None Available. FINDINGS: Examination is significantly degraded due to lack a lateral projection radiograph. Given this limitation, no definite displaced fractures identified with special attention paid to the radial head. Nonspecific diffuse soft tissue swelling about the elbow. No radiopaque foreign body. Nondiagnostic evaluation for a joint effusion. Mild degenerative change of the ulnar humeral joint is suspected though incompletely evaluated. IMPRESSION: 1. Degraded examination secondary to lack of a lateral projection radiograph. 2. Nonspecific diffuse soft tissue swelling about the elbow without displaced fracture with special attention paid to the radial head. If clinical concern persists, further evaluation with elbow CT could be performed as indicated. 3. Mild degenerative change of the ulnar humeral joint is suspected though incompletely evaluated. Electronically Signed   By: Sandi Mariscal M.D.   On: 11/16/2021 12:43   EEG adult  Result Date: 11/15/2021 Lora Havens, MD     11/15/2021  6:04 PM Patient Name: Jose [name] MRN: [id number] Epilepsy Attending: Lora Havens Referring Physician/Provider: Marcelyn Bruins, MD Date: 11/15/2021 Duration: 25.25 mins Patient history: 86yo M with ams. EEG to evaluate for seizure Level of  alertness: Awake AEDs during EEG study: None Technical  aspects: This EEG study was done with scalp electrodes positioned according to the 10-20 International system of electrode placement. Electrical activity was reviewed with band pass filter of 1-_0 , sensitivity of 7 uV/mm, display speed of 5m/sec with a _1  notched filter applied as appropriate. EEG data were recorded continuously and digitally stored.  Video monitoring was available and reviewed as appropriate. Description: The posterior dominant rhythm consists of 9 Hz activity of moderate voltage (25-35 uV) seen predominantly in posterior head regions, symmetric and reactive to eye opening and eye closing. EEG showed intermittent generalized 3 to 6 Hz theta-delta slowing. Hyperventilation and photic stimulation were not performed.   ABNORMALITY - Intermittent slow, generalized IMPRESSION: This study is suggestive of mild diffuse encephalopathy, nonspecific etiology. No seizures or epileptiform discharges were seen throughout the recording. [place]      LOS: 1 day   Ahmaya Ostermiller [employer] [email or web address]  11/17/2021, 10:05 AM

## 2021-11-17 NOTE — Progress Notes (Signed)
Mobility Specialist: Progress Note   11/17/21 1554  Mobility  Activity Transferred from chair to bed  Level of Assistance +2 (takes two people)  Games developer wheel walker  Distance Ambulated (ft) 2 ft  Activity Response Tolerated fair  $Mobility charge 1 Mobility   Pt assisted back to bed per request with help from RN. C/o R elbow pain. Pt with significant posterior lean upon standing requiring verbal cues and physical assist to correct. Pt is in the bed with RN present in the room.   Aten Desha Bitner Mobility Specialist Secure Chat Only

## 2021-11-18 DIAGNOSIS — E519 Thiamine deficiency, unspecified: Secondary | ICD-10-CM | POA: Diagnosis not present

## 2021-11-18 DIAGNOSIS — I639 Cerebral infarction, unspecified: Secondary | ICD-10-CM | POA: Diagnosis not present

## 2021-11-18 DIAGNOSIS — M25521 Pain in right elbow: Secondary | ICD-10-CM | POA: Diagnosis not present

## 2021-11-18 LAB — VITAMIN B1: Vitamin B1 (Thiamine): 117.7 nmol/L (ref 66.5–200.0)

## 2021-11-18 MED ORDER — PREDNISONE 20 MG PO TABS
20.0000 mg | ORAL_TABLET | Freq: Every day | ORAL | Status: DC
  Administered 2021-11-18 – 2021-11-19 (×2): 20 mg via ORAL
  Filled 2021-11-18 (×3): qty 1

## 2021-11-18 NOTE — Progress Notes (Signed)
IP rehab admissions - Noted patient less confused today and is refusing SNF placement.  Would like to see how he participates with therapies.  Will ask my partner to follow up on Monday for progress and plans.  Call for questions.  [phone]

## 2021-11-18 NOTE — Plan of Care (Signed)
Orthopedic Plan of Care Note  Aspirate results show 27k cells and urate crystals. No organisms seen on gram stain. These results are consistent with gout. Would recommend treating for gout. Can be weight bearing as tolerated on right upper extremity. Will continue to follow his cultures. If cultures turn positive, plan will change and will recommend irrigation and debridement of elbow. If any questions, please page me. Thanks.   Callie Fielding Orthopedic Surgeon
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
## 2021-11-18 NOTE — Progress Notes (Signed)
[facility] PROGRESS NOTE   Jose [name] NLZ:[id number] DOB: 1935/05/05 DOA: 11/14/2021  PCP: Hoyt Koch, MD  Brief History/Interval Summary: 86 year old Caucasian male with past medical history of alcohol abuse, GERD, thiamine deficiency, ataxia presented with generalized pain.  Found to be encephalopathic.  Work-up revealed acute stroke.  He was transferred to [facility] for further management.  Consultants: Neurology  Procedures: None    Subjective/Interval History: Patient seems to be more awake and alert today.  Answering questions more appropriately today compared to yesterday.  Able to move his right upper extremity more than he was yesterday.  Otherwise he denies any other complaints.      Assessment/Plan:  Wernicke encephalopathy Apparently has had increased confusion and weakness for several days prior to admission.  There was concern for significant thiamine deficiency/Warnicke's.   He was started on high-dose thiamine in the emergency department.  Seen by neurology.  Underwent MRI which showed acute stroke.  See below. B12 level 923, folate 17.6, TSH 2.5. WBC was noted to be elevated.  No evidence of infection on UA.  Chest x-ray more consistent with atelectasis rather than pneumonia.  Procalcitonin level was 0.65.  Lactic acid was normal. Mentation appears to be gradually improving.  Acute CVA Acute infarct in the right corona radiata noted on MRI.   Neurology recommends aspirin and statin daily.   LDL 71.  HbA1c 5.3.  Echocardiogram shows normal systolic function. EEG did not show any epileptiform activity. PT and OT evaluation.  Inpatient rehabilitation is recommended. Patient on aspirin and statin.  Right elbow pain/acute gout Swelling of the right elbow area around with some erythema and warmth noted.  X-ray was done which did not show any obvious fractures.  Patient was quite tender in that area.  Plain films did not show  any acute abnormalities.  CT of the elbow was subsequently done which showed joint effusion and soft tissue swelling.  Uric acid level was normal at 4.5.  ESR and CRP was significantly high.  WBC was noted to be high as well. Orthopedics was subsequently consulted.  Patient underwent joint aspiration on 10/26.  Gram stain does not show any organisms.  He does have intracellular monosodium urate crystals.  Synovial fluid more consistent with acute gout and inflammatory arthritis rather than infectious arthritis.  No plans for joint washout this morning.  Cannot use NSAIDs due to acute stroke and the fact that he is on aspirin.  Hesitate to use steroids as he is recovering from encephalopathy but will give a trial of low-dose steroids. Continue with doxycycline due to concern for cellulitis.  We will plan for 7-day course.   Normal anion gap metabolic acidosis Stable.  Hypokalemia/hyponatremia Supplemented.  Recheck labs tomorrow.  History of alcohol use Was drinking up until 4 to 5 days prior to admission.  No evidence for withdrawal.   Thiamine deficiency Thiamine level was low in June at 75.  Level from 11/14/2021 is not 17.  Patient was started on high-dose thiamine supplementation.  DVT Prophylaxis: Lovenox Code Status: Full code Family Communication: No family at bedside.  Discussed with patient's significant other yesterday.  Patient's son will be arriving from [place] over the weekend. Disposition Plan: Inpatient rehabilitation is recommended by physical therapy.  Status is: Inpatient Remains inpatient appropriate because: Acute stroke   Medications: Scheduled:  aspirin EC  81 mg Oral Daily   atorvastatin  40 mg Oral Daily   doxycycline  100 mg Oral Q12H  enoxaparin (LOVENOX) injection  40 mg Subcutaneous Q24H   feeding supplement  237 mL Oral BID BM   multivitamin with minerals  1 tablet Oral Daily   pantoprazole  40 mg Oral Daily   predniSONE  20 mg Oral Q breakfast    thiamine  250 mg Oral Daily   Followed by   Derrill Memo ON 11/23/2021] thiamine  100 mg Oral Daily   Continuous:   [id number]:[id number] **OR** acetaminophen (TYLENOL) oral liquid 160 mg/5 mL **OR** acetaminophen, senna-docusate, traMADol  Antibiotics: Anti-infectives (From admission, onward)    Start     Dose/Rate Route Frequency Ordered Stop   11/17/21 1000  doxycycline (VIBRA-TABS) tablet 100 mg        100 mg Oral Every 12 hours 11/17/21 0841         Objective:  Vital Signs  Vitals:   11/17/21 2000 11/18/21 0000 11/18/21 0332 11/18/21 0906  BP:   101/74 106/76  Pulse:  (!) 101 (!) 106 93  Resp:  _0 Temp:  98.3 F (36.8 C) 98.6 F (37 C) 98.2 F (36.8 C)  TempSrc:  Oral  Oral  SpO2: 97% 95% 96% 100%  Weight:      Height:        Intake/Output Summary (Last 24 hours) at 11/18/2021 1036 Last data filed at 11/17/2021 2000 Gross per 24 hour  Intake 320 ml  Output --  Net 320 ml    Filed Weights   11/14/21 1134  Weight: 71.2 kg    General appearance: Awake alert.  In no distress.  Less distracted today Resp: Clear to auscultation bilaterally.  Normal effort Cardio: S1-S2 is normal regular.  No S3-S4.  No rubs murmurs or bruit GI: Abdomen is soft.  Nontender nondistended.  Bowel sounds are present normal.  No masses organomegaly Extremities: Improved mobility of the right arm is noted.  Continues to have some swelling of the right elbow with mild erythema and mild warmth. Neurologic: He knew he was in [place].  He figured out he was [facility].  He knew the year and the month.  No focal neurological deficits.     Lab Results:  Data Reviewed: I have personally reviewed following labs and reports of the imaging studies  CBC: Recent Labs  Lab 11/14/21 1345 11/15/21 0945 11/16/21 0625  WBC 14.7* 10.8* 11.9*  NEUTROABS 10.4* 7.5 8.1*  HGB 15.9 14.5 13.3  HCT 48.8 43.9 38.0*  MCV 94.4 92.6 90.0  PLT 337 295 276     Basic Metabolic  Panel: Recent Labs  Lab 11/14/21 1345 11/15/21 0945 11/16/21 0625 11/17/21 0852  NA 135 135 135 132*  K 4.1 3.7 3.4* 3.5  CL 99 103 101 96*  CO2 22 20* 21* 21*  GLUCOSE 126* 90 95 86  BUN _1 CREATININE 1.04 0.79 0.81 0.75  CALCIUM 9.6 9.2 9.4 9.2  MG  --  2.0 1.9 1.9  PHOS  --  3.4 3.0  --      GFR: Estimated Creatinine Clearance: 62.2 mL/min (by C-G formula based on SCr of 0.75 mg/dL).  Liver Function Tests: Recent Labs  Lab 11/14/21 1345 11/15/21 0945 11/16/21 0625  AST _2 ALT _3 ALKPHOS 63 55 47  BILITOT 1.3* 1.2 1.1  PROT 7.4 6.8 5.9*  ALBUMIN 3.7 3.2* 2.6*     Recent Labs  Lab 11/14/21 1345  LIPASE 39    Recent Labs  Lab 11/14/21  1345  AMMONIA 13      Recent Results (from the past 240 hour(s))  Urine Culture     Status: None   Collection Time: 11/14/21 10:24 PM   Specimen: Urine, Clean Catch  Result Value Ref Range Status   Specimen Description   Final    URINE, CLEAN CATCH Performed at [facility], [place] [address], [place], [place] [address]    Special Requests   Final    NONE Performed at [facility], [place] [address] [place], [place] [address]    Culture   Final    NO GROWTH Performed at [facility], [place] [address] [place], [place] [address]    Report Status 11/16/2021 FINAL  Final  Culture, blood (Routine X 2) w Reflex to ID Panel     Status: None (Preliminary result)   Collection Time: 11/15/21  9:40 AM   Specimen: BLOOD  Result Value Ref Range Status   Specimen Description   Final    BLOOD BLOOD LEFT WRIST Performed at [place] [address], [place], [place] [address]    Special Requests   Final    BOTTLES DRAWN AEROBIC AND ANAEROBIC Blood Culture adequate volume Performed at [place] [address], [place], [place] [address]    Culture   Final    NO GROWTH 3 DAYS Performed at [facility],  [place] [address] [place], [place] [address]    Report Status PENDING  Incomplete  Culture, blood (Routine X 2) w Reflex to ID Panel     Status: None (Preliminary result)   Collection Time: 11/15/21  9:45 AM   Specimen: BLOOD  Result Value Ref Range Status   Specimen Description   Final    BLOOD RIGHT ANTECUBITAL Performed at [place] [address] [place], [place] [address]    Special Requests   Final    BOTTLES DRAWN AEROBIC AND ANAEROBIC Blood Culture adequate volume Performed at [place] [address] [place], [place] [address]    Culture   Final    NO GROWTH 3 DAYS Performed at [facility], [place] [address] [place], [place] [address]    Report Status PENDING  Incomplete  Body fluid culture w Gram Stain     Status: None (Preliminary result)   Collection Time: 11/17/21  7:32 PM   Specimen: Synovium; Synovial Fluid  Result Value Ref Range Status   Specimen Description SYNOVIAL  Final   Special Requests RIGHT ELBOW  Final   Gram Stain   Final    MODERATE WBC PRESENT, PREDOMINANTLY PMN NO ORGANISMS SEEN Performed at [facility], [address], [place], [place] [address]    Culture PENDING  Incomplete   Report Status PENDING  Incomplete  Anaerobic culture w Gram Stain     Status: None (Preliminary result)   Collection Time: 11/17/21  7:32 PM   Specimen: Synovium; Synovial Fluid  Result Value Ref Range Status   Specimen Description SYNOVIAL  Final   Special Requests RIGHT ELBOW  Final   Gram Stain   Final    MODERATE WBC PRESENT, PREDOMINANTLY PMN NO ORGANISMS SEEN Performed at [facility], [address] [place], [place] [address]    Culture PENDING  Incomplete   Report Status PENDING  Incomplete      Radiology Studies: CT ELBOW RIGHT WO CONTRAST  Result Date: 11/17/2021 CLINICAL DATA:  Soft tissue infection suspected.  Elbow pain. EXAM: CT OF THE UPPER RIGHT EXTREMITY WITHOUT CONTRAST TECHNIQUE:  Multidetector CT imaging of the right elbow was performed according to the standard protocol. RADIATION DOSE REDUCTION: This exam was performed according to the departmental dose-optimization program which includes automated exposure control, adjustment of the mA and/or kV according to patient size and/or use of iterative reconstruction technique. COMPARISON:  Radiographs 11/16/2021 FINDINGS: Bones/Joint/Cartilage Examination was performed with the patient's elbow by his side. Resulting beam hardening artifact limits image quality. No evidence of acute fracture, dislocation or bone destruction. There are ring osteophytes of the radial neck. Osteophytes and subchondral cyst formation are present within the coronoid process. There is a small to moderate nonspecific elbow joint effusion. Ligaments Suboptimally assessed by CT. Muscles and Tendons As evaluated by noncontrast CT, the muscles and tendons about the elbow appear unremarkable. Triceps and biceps tendons appear intact. Soft tissues Suspected soft tissue edema about the elbow, especially posteriorly and laterally. No focal fluid collection, foreign body or soft tissue emphysema identified on noncontrast imaging. IMPRESSION: 1. No evidence of acute fracture, dislocation or bone destruction. 2. Nonspecific elbow joint effusion which could be secondary to underlying arthritis. Septic arthritis not excluded. 3. Suspected soft tissue edema about the elbow, especially posteriorly and laterally, which could reflect superficial soft tissue infection (cellulitis). No focal fluid collection, foreign body or soft tissue emphysema identified on noncontrast imaging. Electronically Signed   By: Richardean Sale M.D.   On: 11/17/2021 13:55   DG Elbow 2 Views Right  Result Date: 11/16/2021 CLINICAL DATA:  Right elbow pain. EXAM: RIGHT ELBOW - 2 VIEW COMPARISON:  None Available. FINDINGS: Examination is significantly degraded due to lack a lateral projection radiograph.  Given this limitation, no definite displaced fractures identified with special attention paid to the radial head. Nonspecific diffuse soft tissue swelling about the elbow. No radiopaque foreign body. Nondiagnostic evaluation for a joint effusion. Mild degenerative change of the ulnar humeral joint is suspected though incompletely evaluated. IMPRESSION: 1. Degraded examination secondary to lack of a lateral projection radiograph. 2. Nonspecific diffuse soft tissue swelling about the elbow without displaced fracture with special attention paid to the radial head. If clinical concern persists, further evaluation with elbow CT could be performed as indicated. 3. Mild degenerative change of the ulnar humeral joint is suspected though incompletely evaluated. Electronically Signed   By: Sandi Mariscal M.D.   On: 11/16/2021 12:43       LOS: 2 days   [place] [facility] Pager on [email or web address]  11/18/2021, 10:36 AM

## 2021-11-18 NOTE — Progress Notes (Signed)
Mobility Specialist: Progress Note   11/18/21 1600  Mobility  Activity Transferred to/from [facility]  Level of Assistance +2 (takes two people)  Assistive Device Other (Comment) (HHA)  Distance Ambulated (ft) 4 ft (2'+2')  Activity Response Tolerated poorly  $Mobility charge 1 Mobility   Pt assisted to/from [facility] per request with help from RN. Pt modA-maxA for pivot. Poor ability to follow commands with c/o pain in LUE throughout. Pt assisted with pericare and then back to bed. Pt has call bell at his side. Bed alarm is on.   McCurtain Belen [place] Mobility Specialist Secure Chat Only

## 2021-11-18 NOTE — Care Management Important Message (Signed)
Important Message  Patient Details  Name: Jose [name] MRN: [id number] Date of Birth: 1935/10/12   Medicare Important Message Given:  Yes     Hannah Beat 11/18/2021, 2:51 PM

## 2021-11-18 NOTE — NC FL2 (Signed)
[place] LEVEL OF CARE SCREENING TOOL     IDENTIFICATION  Patient Name: Jose [name] Birthdate: [birth year] Sex: male Admission Date (Current Location): 11/14/2021  [facility] and [place] Number:  Herbalist and Address:  The [place], [place] [address] [place], [place] [address]      Provider Number: [id number]  Attending Physician Name and Address:  Bonnielee Haff, MD  Relative Name and Phone Number:  [name] [place]: Hospital Recommended Level of Care: [place] Prior Approval Number:    Date Approved/Denied:   PASRR Number: [id number]  Discharge Plan: SNF    Current Diagnoses: Patient Active Problem List   Diagnosis Date Noted   Pain in right elbow    Acute CVA (cerebrovascular accident) ([place]) 11/14/2021   Acute encephalopathy 11/14/2021   Acute back pain 10/26/2021   Urinary frequency 10/26/2021   Confusion 08/23/2021   Weakness 08/23/2021   Alcohol abuse 08/23/2021   Insomnia 07/18/2021   Chronic fatigue syndrome 11/19/2020   Arthritis 11/19/2020   GERD (gastroesophageal reflux disease) 11/19/2020   Ataxia 05/18/2020   Thiamine deficiency 05/18/2020    Orientation RESPIRATION BLADDER Height & Weight     Self, Time, Place  Normal Continent, External catheter Weight: 156 lb 15.7 oz (71.2 kg) Height:  '5\' 4"'$  (162.6 cm)  BEHAVIORAL SYMPTOMS/MOOD NEUROLOGICAL BOWEL NUTRITION STATUS      Continent Diet (See DC summary)  AMBULATORY STATUS COMMUNICATION OF NEEDS Skin   Extensive Assist Verbally Skin abrasions (R buttock skin tear)                       Personal Care Assistance Level of Assistance  Bathing, Feeding, Dressing Bathing Assistance: Maximum assistance Feeding assistance: Limited assistance Dressing Assistance: Maximum assistance     Functional Limitations Info  Sight, Hearing, Speech Sight Info: Adequate Hearing Info: Adequate Speech Info:  Adequate    SPECIAL CARE FACTORS FREQUENCY  PT (By licensed PT), OT (By licensed OT)     PT Frequency: 5x week OT Frequency: 5x week            Contractures Contractures Info: Not present    Additional Factors Info  Code Status, Allergies Code Status Info: Full Allergies Info: NKA           Current Medications (11/18/2021):  This is the current hospital active medication list Current Facility-Administered Medications  Medication Dose Route Frequency Provider Last Rate Last Admin   acetaminophen (TYLENOL) tablet 650 mg  650 mg Oral Q4H PRN Marcelyn Bruins, MD   650 mg at 11/17/21 6283   Or   acetaminophen (TYLENOL) 160 MG/5ML solution 650 mg  650 mg Per Tube Q4H PRN Marcelyn Bruins, MD       Or   acetaminophen (TYLENOL) suppository 650 mg  650 mg Rectal Q4H PRN Marcelyn Bruins, MD       aspirin EC tablet 81 mg  81 mg Oral Daily Marcelyn Bruins, MD   81 mg at 11/18/21 1110   atorvastatin (LIPITOR) tablet 40 mg  40 mg Oral Daily Raiford Noble Eros, DO   40 mg at 11/18/21 1101   doxycycline (VIBRA-TABS) tablet 100 mg  100 mg Oral Q12H Bonnielee Haff, MD   100 mg at 11/18/21 1101   enoxaparin (LOVENOX) injection 40 mg  40 mg Subcutaneous Q24H Marcelyn Bruins, MD   40 mg at 11/17/21 2240  feeding supplement (ENSURE ENLIVE / ENSURE PLUS) liquid 237 mL  237 mL Oral BID BM Bonnielee Haff, MD   237 mL at 11/17/21 1436   multivitamin with minerals tablet 1 tablet  1 tablet Oral Daily Bonnielee Haff, MD   1 tablet at 11/18/21 1110   pantoprazole (PROTONIX) EC tablet 40 mg  40 mg Oral Daily Marcelyn Bruins, MD   40 mg at 11/18/21 1101   predniSONE (DELTASONE) tablet 20 mg  20 mg Oral Q breakfast Bonnielee Haff, MD   20 mg at 11/18/21 1143   senna-docusate (Senokot-S) tablet 1 tablet  1 tablet Oral QHS PRN Marcelyn Bruins, MD       thiamine (VITAMIN B1) tablet 250 mg  250 mg Oral Daily Donnamae Jude, RPH   250 mg at 11/18/21 1100   Followed by   Derrill Memo  ON 11/23/2021] thiamine (VITAMIN B1) tablet 100 mg  100 mg Oral Daily Donnamae Jude, RPH       traMADol Veatrice Bourbon) tablet 50 mg  50 mg Oral Q6H PRN Bonnielee Haff, MD   50 mg at 11/17/21 2241     Discharge Medications: Please see discharge summary for a list of discharge medications.  Relevant Imaging Results:  Relevant Lab Results:   Additional Information SS# [place], [place]

## 2021-11-19 DIAGNOSIS — I639 Cerebral infarction, unspecified: Secondary | ICD-10-CM | POA: Diagnosis not present

## 2021-11-19 DIAGNOSIS — M109 Gout, unspecified: Secondary | ICD-10-CM

## 2021-11-19 DIAGNOSIS — M25521 Pain in right elbow: Secondary | ICD-10-CM | POA: Diagnosis not present

## 2021-11-19 DIAGNOSIS — E519 Thiamine deficiency, unspecified: Secondary | ICD-10-CM | POA: Diagnosis not present

## 2021-11-19 LAB — CBC
HCT: 35.7 % — ABNORMAL LOW (ref 39.0–52.0)
Hemoglobin: 12.5 g/dL — ABNORMAL LOW (ref 13.0–17.0)
MCH: 30.9 pg (ref 26.0–34.0)
MCHC: 35 g/dL (ref 30.0–36.0)
MCV: 88.1 fL (ref 80.0–100.0)
Platelets: 357 10*3/uL (ref 150–400)
RBC: 4.05 MIL/uL — ABNORMAL LOW (ref 4.22–5.81)
RDW: 12.5 % (ref 11.5–15.5)
WBC: 7.2 10*3/uL (ref 4.0–10.5)
nRBC: 0 % (ref 0.0–0.2)

## 2021-11-19 LAB — BASIC METABOLIC PANEL
Anion gap: 11 (ref 5–15)
BUN: 14 mg/dL (ref 8–23)
CO2: 24 mmol/L (ref 22–32)
Calcium: 9.7 mg/dL (ref 8.9–10.3)
Chloride: 102 mmol/L (ref 98–111)
Creatinine, Ser: 0.81 mg/dL (ref 0.61–1.24)
GFR, Estimated: >60 mL/min (ref >60)
Glucose, Bld: 115 mg/dL — ABNORMAL HIGH (ref 70–99)
Potassium: 3.6 mmol/L (ref 3.5–5.1)
Sodium: 137 mmol/L (ref 135–145)

## 2021-11-19 MED ORDER — POTASSIUM CHLORIDE CRYS ER 20 MEQ PO TBCR
40.0000 meq | EXTENDED_RELEASE_TABLET | Freq: Once | ORAL | Status: AC
  Administered 2021-11-19: 40 meq via ORAL
  Filled 2021-11-19: qty 2

## 2021-11-19 NOTE — PMR Pre-admission (Signed)
PMR Admission Coordinator Pre-Admission Assessment  Patient: Jose [name] is an 86 y.o., male MRN: [id number] DOB: 14-Jul-1935 Height: _0  (162.6 cm) Weight: 71.2 kg  Insurance Information HMO:     PPO:      PCP:      IPA:      80/20: yes     OTHER:  PRIMARY: Medicare       Policy#:[id number]       Subscriber: Pt. Phone#: Verified online    Fax#:  Pre-Cert#:       Employer:  Benefits:  Phone #:      Name:  Eff. Date: Parts A and B effective 04/24/2002 Deduct: $1600      Out of Pocket Max:  None      Life Max: N/A  CIR: 100%      SNF: 100 days Outpatient: 80%     Co-Pay: 20% Home Health: 100%      Co-Pay: none DME: 80%     Co-Pay: 20% Providers: patient's choice Providers:  SECONDARY:       Policy#:      Phone#:   Development worker, community:       Phone#:   The Therapist, art Information Summary" for patients in Inpatient Rehabilitation Facilities with attached "Privacy Act [place] Records" was provided and verbally reviewed with: Family  Emergency Contact Information Contact Information     Name Relation Home Work Mobile   Curtis,[name] Significant other [phone]         Current Medical History  Patient Admitting Diagnosis: Wernicke's Encephalopathy, CVA  History of Present Illness: Jose [name] is a 86 y.o. male with medical history significant of alcohol use, GERD, thiamine deficiency, ataxia who presented to the [facility] ED with ongoing generalized pain.  With additional information from friend EDP was able to determine the patient had stopped drinking 4 to 5 days ago.  Also that he has years of issues with his balance and weakness however he has had significant increase in his confusion and weakness in the last 4 to 5 days (patient initially stated he did not know when he quit in the ED, on my exam he states that his girlfriend is making it up and that it was 4 to 5 weeks ago).Vital signs in the ED significant for heart rate in the 40s to 120s,  blood pressure in the 950D to 326 systolic, respiratory rate in the 20s to 30s.  Episode of hypoxia documented however this has not been persistent.  Lab work-up included CMP with glucose 126, T. bili 1.3.  CBC with leukocytosis to 14.7.  Lipase normal.  Urinalysis pending.  Ethanol level negative.  Ammonia level negative.  Pending labs include B1, B12, folate, TSH.  CT head showed new focal hypodense lesion.  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  Neurology was consulted and recommended stroke work-up as well as administration of thiamine and further encephalopathy work-up.  Patient received thiamine and IV fluids in the ED. No signs of ETOH withdrawal this admission. Echocardiogram shows normal systolic function. EEG did not show any epileptiform activity. Pt. Also with swelling of the right elbow area around with some erythema and warmth noted.  X-ray was done which did not show any obvious fractures.    Plain films did not show any acute abnormalities.  CT of the elbow was subsequently done which showed joint effusion and soft tissue swelling.  Uric acid level was normal at 4.5.  ESR and CRP  was significantly high.  WBC was noted to be high as well.Orthopedics was subsequently consulted.  Patient underwent joint aspiration on 10/26.  Gram stain does not show any organisms.  He does have intracellular monosodium urate crystals.  Synovial fluid more consistent with acute gout and inflammatory arthritis rather than infectious arthritis.  Cultures without any growth so far. Started on doxycycline. Per ortho, no plan for surgery at this time PT, SLP,  and OT saw Pt. And recommended CIR to assist return to PLOF.    Complete NIHSS TOTAL: 0  Patient[facility]  has been reviewed by the rehabilitation admission coordinator and physician.  Past Medical History  Past Medical History:  Diagnosis Date   Acute meniscal tear of knee    RIGHT KNEE    Arthritis    Hives    PER PT UNKNOWN WHY   IBS (irritable bowel syndrome)    Mild sleep apnea    PER STUDY  11-30-2010--  NO CPAP   Spinal stenosis     Has the patient had major surgery during 100 days prior to admission? No  Family History   family history includes Heart disease in his father and mother.  Current Medications  Current Facility-Administered Medications:    acetaminophen (TYLENOL) tablet 650 mg, 650 mg, Oral, Q4H PRN, 650 mg at 11/17/21 0835 **OR** acetaminophen (TYLENOL) 160 MG/5ML solution 650 mg, 650 mg, Per Tube, Q4H PRN **OR** acetaminophen (TYLENOL) suppository 650 mg, 650 mg, Rectal, Q4H PRN, Marcelyn Bruins, MD   aspirin EC tablet 81 mg, 81 mg, Oral, Daily, Marcelyn Bruins, MD, 81 mg at 11/19/21 0958   atorvastatin (LIPITOR) tablet 40 mg, 40 mg, Oral, Daily, Sheikh, Omair Latif, DO, 40 mg at 11/19/21 2831   doxycycline (VIBRA-TABS) tablet 100 mg, 100 mg, Oral, Q12H, Bonnielee Haff, MD, 100 mg at 11/19/21 0958   enoxaparin (LOVENOX) injection 40 mg, 40 mg, Subcutaneous, Q24H, Marcelyn Bruins, MD, 40 mg at 11/18/21 2201   feeding supplement (ENSURE ENLIVE / ENSURE PLUS) liquid 237 mL, 237 mL, Oral, BID BM, Bonnielee Haff, MD, 237 mL at 11/17/21 1436   multivitamin with minerals tablet 1 tablet, 1 tablet, Oral, Daily, Bonnielee Haff, MD, 1 tablet at 11/19/21 0958   pantoprazole (PROTONIX) EC tablet 40 mg, 40 mg, Oral, Daily, Marcelyn Bruins, MD, 40 mg at 11/19/21 5176   potassium chloride SA (KLOR-CON M) CR tablet 40 mEq, 40 mEq, Oral, Once, Bonnielee Haff, MD   predniSONE (DELTASONE) tablet 20 mg, 20 mg, Oral, Q breakfast, Bonnielee Haff, MD, 20 mg at 11/19/21 0747   senna-docusate (Senokot-S) tablet 1 tablet, 1 tablet, Oral, QHS PRN, Marcelyn Bruins, MD   thiamine (VITAMIN B1) tablet 250 mg, 250 mg, Oral, Daily, 250 mg at 11/19/21 0957 **FOLLOWED BY** [START ON 11/23/2021] thiamine (VITAMIN B1) tablet 100 mg, 100 mg, Oral, Daily, Donnamae Jude, RPH   traMADol Veatrice Bourbon) tablet 50 mg, 50 mg, Oral, Q6H PRN, Bonnielee Haff, MD, 50 mg at 11/17/21 2241  Patients Current Diet:  Diet Order             Diet Heart Room service appropriate? Yes; Fluid consistency: Thin  Diet effective now                   Precautions / Restrictions Precautions Precautions: Fall Precaution Comments: permissive HTN, Rt shoulder and elbow pain Restrictions Weight Bearing Restrictions: No   Has the patient had 2 or more falls  or a fall with injury in the past year? Yes  Prior Activity Level Community (5-7x/wk): Pt. active in the community PTA  Prior Functional Level Self Care: Did the patient need help bathing, dressing, using the toilet or eating? Independent  Indoor Mobility: Did the patient need assistance with walking from room to room (with or without device)? Independent  Stairs: Did the patient need assistance with internal or external stairs (with or without device)? Independent  Functional Cognition: Did the patient need help planning regular tasks such as shopping or remembering to take medications? Independent  Patient Information Are you of Hispanic, Latino/a,or Spanish origin?: A. No, not of Hispanic, Latino/a, or Spanish origin What is your race?: A. White Do you need or want an interpreter to communicate with a doctor or health care staff?: 0. No  Patient's Response To:  Health Literacy and Transportation Is the patient able to respond to health literacy and transportation needs?: No Health Literacy - How often do you need to have someone help you when you read instructions, pamphlets, or other written material from your doctor or pharmacy?: Sometimes In the past 12 months, has lack of transportation kept you from medical appointments or from getting medications?: No In the past 12 months, has lack of transportation kept you from meetings, work, or from getting things needed for daily living?: No  Home Assistive Devices  / [place] Devices/Equipment: Environmental consultant (specify type) Home Equipment: Adaptive equipment, Rolling Walker (2 wheels), Cane - single point  Prior Device Use: Indicate devices/aids used by the patient prior to current illness, exacerbation or injury? None of the above  Current Functional Level Cognition  Arousal/Alertness: Awake/alert Overall Cognitive Status: Impaired/Different from baseline Orientation Level: Oriented to person, Oriented to place Following Commands: Follows one step commands inconsistently, Follows one step commands with increased time Safety/Judgement: Decreased awareness of safety, Decreased awareness of deficits General Comments: Poor awareness, significantly increased time for sequencing. Difficulty with motor planning. Repetitively asking "what is it you want me to do". Awareness of urge to use restroom, unaware he was already actively going. Attention: Sustained Sustained Attention: Impaired Sustained Attention Impairment: Verbal basic, Functional basic Memory: Impaired Memory Impairment: Retrieval deficit, Decreased recall of new information, Decreased short term memory Decreased Short Term Memory: Verbal basic, Functional basic Awareness: Impaired Awareness Impairment: Intellectual impairment, Anticipatory impairment Problem Solving: Impaired Problem Solving Impairment: Verbal basic, Functional basic Executive Function: Organizing, Reasoning Reasoning: Impaired Reasoning Impairment: Verbal basic, Functional basic Organizing: Impaired Organizing Impairment: Verbal basic, Functional basic Behaviors: Perseveration, Verbal agitation Safety/Judgment: Impaired Comments: Pt pulled IV out of arm prior to SLP arrival    Extremity Assessment (includes Sensation/Coordination)  Upper Extremity Assessment: RUE deficits/detail RUE Deficits / Details: Going for MRI of R elbow today RUE Sensation: WNL RUE Coordination: WNL (but no movement shoulder or  elbow) LUE Deficits / Details: WFL LUE Sensation: WNL LUE Coordination: WNL  Lower Extremity Assessment: Defer to PT evaluation RLE Deficits / Details: grossly pt is at 3/5 on Rt LE with hip flexion, knee extension, and plantar flexion, 4-/5 for dorsiflexion RLE Sensation: WNL RLE Coordination: WNL LLE Deficits / Details: grossly pt is at 4-/5 on Rt LE with hip flexion, knee extension, and dorsiflexion, 3/5 for plantarflexion LLE Sensation: WNL LLE Coordination: WNL    ADLs  Overall ADL's : Needs assistance/impaired Eating/Feeding: Bed level, Set up Grooming: Wash/dry face, Bed level, Set up Upper Body Bathing: Minimal assistance, Bed level Lower Body Bathing: +2 for physical assistance, Maximal assistance, Bed  level Upper Body Dressing : Moderate assistance, Bed level Lower Body Dressing: Total assistance, Bed level Toilet Transfer: Moderate assistance, +2 for physical assistance, Rolling walker (2 wheels), BSC/3in1 Toilet Transfer Details (indicate cue type and reason): Initially Min A for rise from low bed surface, but ultimately mod A to coordinate SPT to [facility] and mod A to rise from [facility]. Required min guard sitting on toilet due to poor safety awareness and decreased trunk strength/control Toileting- Clothing Manipulation and Hygiene: Total assistance, Sit to/from stand Toileting - Clothing Manipulation Details (indicate cue type and reason): Total A for posterior pericare. Functional mobility during ADLs: Moderate assistance, +2 for physical assistance, Rolling walker (2 wheels)    Mobility  Overal bed mobility: Needs Assistance Bed Mobility: Supine to Sit, Sit to Supine Supine to sit: +2 for physical assistance, Mod assist, HOB elevated Sit to supine: Mod assist, HOB elevated Sit to sidelying: Mod assist, +2 for safety/equipment General bed mobility comments: up to sit EOB with assist for scooting hips and lifting trunk with much increased time and cues; assist to supine for each  leg onto bed and lowering shoulders with cues and time    Transfers  Overall transfer level: Needs assistance Equipment used: Rolling walker (2 wheels) Transfers: Sit to/from Stand, Bed to chair/wheelchair/BSC Sit to Stand: Min assist, Mod assist, +2 physical assistance Bed to/from chair/wheelchair/BSC transfer type:: Step pivot Step pivot transfers: Mod assist, +2 physical assistance, Max assist General transfer comment: assist up to standing with min A initially with elevated height of bed and cues for anterior weight shift ; step to [facility] with +2 mod A for walker management and proximity and cues for posture; stood from [facility] with some lifting assist to RW and stepping to bed wtih much increased time, mod cues and assist for RW, cues for posture, etc    Ambulation / Gait / Stairs / Wheelchair Mobility  Ambulation/Gait General Gait Details: unable    Posture / Balance Dynamic Sitting Balance Sitting balance - Comments: mod A for posterior bias on EOB, after on BSC leaning forward to have BM and minguard for safety due to poor safety awareness Balance Overall balance assessment: Needs assistance Sitting-balance support: Feet supported Sitting balance-Leahy Scale: Poor Sitting balance - Comments: mod A for posterior bias on EOB, after on BSC leaning forward to have BM and minguard for safety due to poor safety awareness Postural control: Posterior lean Standing balance support: Bilateral upper extremity supported Standing balance-Leahy Scale: Poor Standing balance comment: +2 for safety static standing with RW posterior bias initially    Special needs/care consideration Skin intact   Previous Home Environment (from acute therapy documentation) Living Arrangements: Alone  Lives With: Other (Comment) (potentially girlfriend; unsure of accuracy of information given by pt) Available Help at Discharge: Available PRN/intermittently Type of Home: House Home Layout: Two level Alternate Level  Stairs-Number of Steps: Laundry and computer in basement. Pt likes to access but necessary. Home Access: Stairs to enter Entrance Stairs-Number of Steps: 1 (threshold) Bathroom Shower/Tub: Tub/shower unit (Also with walk in shower but pt prefers tub) Biochemist, clinical: [place]: No Additional Comments: pt typically very active and independent. he workouts at the gym ~5x/week and  Discharge Living Setting Plans for Discharge Living Setting: Patient's home Type of Home at Discharge: Other (Comment) Discharge Home Layout: Able to live on main level with bedroom/bathroom Discharge Home Access: Elevator Discharge Bathroom Shower/Tub: Tub/shower unit Discharge Bathroom Toilet: Standard Discharge Bathroom Accessibility: Yes How Accessible: Accessible via  walker, Accessible via wheelchair Does the patient have any problems obtaining your medications?: No  Social/Family/Support Systems Patient Roles: Partner Contact Information: [phone] Anticipated Caregiver: Nelda Bucks Anticipated Caregiver's Contact Information: [phone] Ability/Limitations of Caregiver: can provide min A (Family can hire private Leeper when Webb Silversmith is gone) Careers adviser: Evenings only Discharge Plan Discussed with Primary Caregiver: Yes Is Caregiver In Agreement with Plan?: No Does Caregiver/Family have Issues with Lodging/Transportation while Pt is in Rehab?: No  Goals Patient/Family Goal for Rehab: PT/OT/SLP Min A Expected length of stay: 14-16 days Pt/Family Agrees to Admission and willing to participate: Yes Program Orientation Provided & Reviewed with Pt/Caregiver Including Roles  & Responsibilities: Yes  Decrease burden of Care through IP rehab admission: none  Possible need for SNF placement upon discharge: none   Patient Condition: I have reviewed medical records from [facility], spoken with CM, and patient and son. I met with patient at the bedside  for inpatient rehabilitation assessment.  Patient will benefit from ongoing PT, OT, and SLP, can actively participate in 3 hours of therapy a day 5 days of the week, and can make measurable gains during the admission.  Patient will also benefit from the coordinated team approach during an Inpatient Acute Rehabilitation admission.  The patient will receive intensive therapy as well as Rehabilitation physician, nursing, social worker, and care management interventions.  Due to safety, skin/wound care, disease management, medication administration, pain management, and patient education the patient requires 24 hour a day rehabilitation nursing.  The patient is currently min+2-mod+2  with mobility and basic ADLs.  Discharge setting and therapy post discharge at home with home health is anticipated.  Patient has agreed to participate in the Acute Inpatient Rehabilitation Program and will admit today.  Preadmission Screen Completed By:  Genella Mech, 11/19/2021 10:48 AM ______________________________________________________________________   Discussed status with Dr. Letta Pate  on 11/20/21 at 39 and received approval for admission today.  Admission Coordinator:  Genella Mech, CCC-SLP, time 932/Date 11/20/21   Assessment/Plan: Diagnosis:  Right corona radiata infarct  Does the need for close, 24 hr/day Medical supervision in concert with the patient's rehab needs make it unreasonable for this patient to be served in a less intensive setting? Yes Co-Morbidities requiring supervision/potential complications: thiamine deficiency  Due to bladder management, bowel management, safety, skin/wound care, disease management, medication administration, pain management, and patient education, does the patient require 24 hr/day rehab nursing? Yes Does the patient require coordinated care of a physician, rehab nurse, PT, OT, and SLP to address physical and functional deficits in the context of the above medical  diagnosis(es)? Yes Addressing deficits in the following areas: balance, endurance, locomotion, strength, transferring, bowel/bladder control, bathing, dressing, feeding, toileting, cognition, and psychosocial support Can the patient actively participate in an intensive therapy program of at least 3 hrs of therapy 5 days a week? Yes The potential for patient to make measurable gains while on inpatient rehab is good Anticipated functional outcomes upon discharge from inpatient rehab: supervision PT, supervision OT, supervision SLP Estimated rehab length of stay to reach the above functional goals is: 14-16d Anticipated discharge destination: Home 10. Overall Rehab/Functional Prognosis: good   MD Signature: Charlett Blake M.D. [place] [facility] Fellow Am Acad of Phys Med and Rehab Diplomate Am Board of Electrodiagnostic Med Fellow Am Board of Interventional Pain

## 2021-11-19 NOTE — Plan of Care (Signed)
Orthopedic Plan of Care  No growth and no organisms seen on the aspirate of the right elbow. No operative plan at this time. Can continue to treat for gout. Will follow the cultures as a positive culture would change management and would recommend operative irrigation and debridement. Patient can be weight bearing as tolerated on the right upper extremity.   Callie Fielding, MD Orthopedic Surgeon

## 2021-11-19 NOTE — Plan of Care (Signed)
  Problem: Education: Goal: Knowledge of disease or condition will improve Outcome: Progressing Goal: Knowledge of secondary prevention will improve (MUST DOCUMENT ALL) Outcome: Progressing   Problem: Ischemic Stroke/TIA Tissue Perfusion: Goal: Complications of ischemic stroke/TIA will be minimized Outcome: Progressing   Problem: Coping: Goal: Will verbalize positive feelings about self Outcome: Progressing Goal: Will identify appropriate support needs Outcome: Progressing   Problem: Nutrition: Goal: Adequate nutrition will be maintained Outcome: Progressing   Problem: Coping: Goal: Level of anxiety will decrease Outcome: Progressing   Problem: Safety: Goal: Ability to remain free from injury will improve Outcome: Progressing

## 2021-11-19 NOTE — Progress Notes (Signed)
Inpatient Rehab Admissions Coordinator:    I spoke with Pt., significant other, and son regarding potential CIR admit. They are interested and state that Pt.'s significant other, Webb Silversmith, can care for him in the evenings and that they can hire private duty nurses while she is not home. Pt. Is not ready for admit today, per acute MD, but may be ready for potential admit tomorrow.   Clemens Catholic, [place], [place] Admissions Coordinator  [phone] ([place]) [phone] (office)

## 2021-11-19 NOTE — Progress Notes (Signed)
[facility] PROGRESS NOTE   Jose [name] HUT:[id number] DOB: 1935-07-06 DOA: 11/14/2021  PCP: Hoyt Koch, MD  Brief History/Interval Summary: 86 year old Caucasian male with past medical history of alcohol abuse, GERD, thiamine deficiency, ataxia presented with generalized pain.  Found to be encephalopathic.  Work-up revealed acute stroke.  He was transferred to [facility] for further management.  Consultants: Neurology.  Orthopedics, Dr. Laurance Flatten with [facility]  Procedures:  Left elbow arthrocentesis Transthoracic echocardiogram EEG    Subjective/Interval History: Patient noted to be distracted this morning.  Confused about where he was.  Somewhat irritable.  However mentions that the pain in the right elbow appears to have improved.     Assessment/Plan:  Wernicke encephalopathy Apparently has had increased confusion and weakness for several days prior to admission.  There was concern for significant thiamine deficiency/Warnicke's.   He was started on high-dose thiamine in the emergency department.  Seen by neurology.  Underwent MRI which showed acute stroke.  See below. B12 level 923, folate 17.6, TSH 2.5. WBC was noted to be elevated.  No evidence of infection on UA.  Chest x-ray more consistent with atelectasis rather than pneumonia.  Procalcitonin level was 0.65.  Lactic acid was normal. Still noted to be confused at times though appears to be gradually improving.  Continue to monitor.  Acute CVA Acute infarct in the right corona radiata noted on MRI.   Neurology recommends aspirin and statin daily.   LDL 71.  HbA1c 5.3.  Echocardiogram shows normal systolic function. EEG did not show any epileptiform activity. PT and OT evaluation.  Inpatient rehabilitation is recommended. Patient on aspirin and statin.  Right elbow pain/acute gout Swelling of the right elbow area around with some erythema and warmth noted.  X-ray was done which did not  show any obvious fractures.  Patient was quite tender in that area.  Plain films did not show any acute abnormalities.  CT of the elbow was subsequently done which showed joint effusion and soft tissue swelling.  Uric acid level was normal at 4.5.  ESR and CRP was significantly high.  WBC was noted to be high as well. Orthopedics was subsequently consulted.  Patient underwent joint aspiration on 10/26.  Gram stain does not show any organisms.  He does have intracellular monosodium urate crystals.  Synovial fluid more consistent with acute gout and inflammatory arthritis rather than infectious arthritis.  Cultures without any growth so far. Cannot use NSAIDs due to acute stroke and the fact that he is on aspirin.  Was started on low-dose prednisone.  Pain in the right elbow appears to have significantly improved.  He is able to lift his arm and bend his elbow without any problems this morning.  Pain appears to be better controlled.  His heart rate is improved.  His WBC is better.  Continue with doxycycline due to concern for cellulitis.  We will plan for 7-day course.   Normal anion gap metabolic acidosis Resolved.  Hypokalemia/hyponatremia Supplemented.   History of alcohol use Was drinking up until 4 to 5 days prior to admission.  No evidence for withdrawal.   Thiamine deficiency Thiamine level was low in June at 92.  Level from 11/14/2021 is not 17.  Patient was started on high-dose thiamine supplementation.  DVT Prophylaxis: Lovenox Code Status: Full code Family Communication: No family at bedside.  Patient's son will be arriving from [place] on Monday.   Disposition Plan: Inpatient rehabilitation is recommended by physical therapy.  It appears that yesterday patient was reluctant to go to rehab.  Discussed with him this morning and told about importance of going to rehab especially because he does live by himself.  He seems to be agreeable.  Status is: Inpatient Remains inpatient  appropriate because: Acute stroke   Medications: Scheduled:  aspirin EC  81 mg Oral Daily   atorvastatin  40 mg Oral Daily   doxycycline  100 mg Oral Q12H   enoxaparin (LOVENOX) injection  40 mg Subcutaneous Q24H   feeding supplement  237 mL Oral BID BM   multivitamin with minerals  1 tablet Oral Daily   pantoprazole  40 mg Oral Daily   predniSONE  20 mg Oral Q breakfast   thiamine  250 mg Oral Daily   Followed by   Derrill Memo ON 11/23/2021] thiamine  100 mg Oral Daily   Continuous:   CBJ:[id number] **OR** acetaminophen (TYLENOL) oral liquid 160 mg/5 mL **OR** acetaminophen, senna-docusate, traMADol  Antibiotics: Anti-infectives (From admission, onward)    Start     Dose/Rate Route Frequency Ordered Stop   11/17/21 1000  doxycycline (VIBRA-TABS) tablet 100 mg        100 mg Oral Every 12 hours 11/17/21 0841 11/24/21 0959       Objective:  Vital Signs  Vitals:   11/18/21 2035 11/18/21 2356 11/19/21 0343 11/19/21 0726  BP: 126/75 137/81 (!) 150/92 136/88  Pulse: (!) 106 99 98 97  Resp: _0 Temp: 97.9 F (36.6 C) 98.3 F (36.8 C) 98.1 F (36.7 C) (!) 97.3 F (36.3 C)  TempSrc: Oral Oral Oral Oral  SpO2: 96% 95% 95% 97%  Weight:      Height:        Intake/Output Summary (Last 24 hours) at 11/19/2021 1018 Last data filed at 11/19/2021 0344 Gross per 24 hour  Intake 480 ml  Output 2250 ml  Net -1770 ml    Filed Weights   11/14/21 1134  Weight: 71.2 kg    General appearance: Awake alert.  In no distress.  Distracted. Resp: Clear to auscultation bilaterally.  Normal effort Cardio: S1-S2 is normal regular.  No S3-S4.  No rubs murmurs or bruit GI: Abdomen is soft.  Nontender nondistended.  Bowel sounds are present normal.  No masses organomegaly Extremities: Improved range of motion of the right elbow Neurologic: No focal neurological deficits.    Lab Results:  Data Reviewed: I have personally reviewed following labs and reports of the imaging  studies  CBC: Recent Labs  Lab 11/14/21 1345 11/15/21 0945 11/16/21 0625 11/19/21 0246  WBC 14.7* 10.8* 11.9* 7.2  NEUTROABS 10.4* 7.5 8.1*  --   HGB 15.9 14.5 13.3 12.5*  HCT 48.8 43.9 38.0* 35.7*  MCV 94.4 92.6 90.0 88.1  PLT 337 295 276 357     Basic Metabolic Panel: Recent Labs  Lab 11/14/21 1345 11/15/21 0945 11/16/21 0625 11/17/21 0852 11/19/21 0246  NA 135 135 135 132* 137  K 4.1 3.7 3.4* 3.5 3.6  CL 99 103 101 96* 102  CO2 22 20* 21* 21* 24  GLUCOSE 126* 90 95 86 115*  BUN _1 CREATININE 1.04 0.79 0.81 0.75 0.81  CALCIUM 9.6 9.2 9.4 9.2 9.7  MG  --  2.0 1.9 1.9  --   PHOS  --  3.4 3.0  --   --      GFR: Estimated Creatinine Clearance: 61.5 mL/min (by C-G formula based on SCr of 0.81  mg/dL).  Liver Function Tests: Recent Labs  Lab 11/14/21 1345 11/15/21 0945 11/16/21 0625  AST _0 ALT _1 ALKPHOS 63 55 47  BILITOT 1.3* 1.2 1.1  PROT 7.4 6.8 5.9*  ALBUMIN 3.7 3.2* 2.6*     Recent Labs  Lab 11/14/21 1345  LIPASE 39    Recent Labs  Lab 11/14/21 1345  AMMONIA 13      Recent Results (from the past 240 hour(s))  Urine Culture     Status: None   Collection Time: 11/14/21 10:24 PM   Specimen: Urine, Clean Catch  Result Value Ref Range Status   Specimen Description   Final    URINE, CLEAN CATCH Performed at [facility], [place] [address], [place], [place] [address]    Special Requests   Final    NONE Performed at [facility], [place] [address], [place], [place] [address]    Culture   Final    NO GROWTH Performed at [facility], [place] [address] [place], [place] [address]    Report Status 11/16/2021 FINAL  Final  Culture, blood (Routine X 2) w Reflex to ID Panel     Status: None (Preliminary result)   Collection Time: 11/15/21  9:40 AM   Specimen: BLOOD  Result Value Ref Range Status   Specimen Description   Final    BLOOD BLOOD LEFT WRIST Performed at [facility] [address] [place], [place] [address]    Special Requests   Final    BOTTLES DRAWN AEROBIC AND ANAEROBIC Blood Culture adequate volume Performed at [place] [address], [place], [place] [address]    Culture   Final    NO GROWTH 4 DAYS Performed at [facility], [place] [address], [place], [place] [address]    Report Status PENDING  Incomplete  Culture, blood (Routine X 2) w Reflex to ID Panel     Status: None (Preliminary result)   Collection Time: 11/15/21  9:45 AM   Specimen: BLOOD  Result Value Ref Range Status   Specimen Description   Final    BLOOD RIGHT ANTECUBITAL Performed at [facility] [address] [place], [place] [address]    Special Requests   Final    BOTTLES DRAWN AEROBIC AND ANAEROBIC Blood Culture adequate volume Performed at [facility] [address], [place], [place] [address]    Culture   Final    NO GROWTH 4 DAYS Performed at [facility], [place] [address], [place], [place] [address]    Report Status PENDING  Incomplete  Body fluid culture w Gram Stain     Status: None (Preliminary result)   Collection Time: 11/17/21  7:32 PM   Specimen: Synovium; Synovial Fluid  Result Value Ref Range Status   Specimen Description SYNOVIAL  Final   Special Requests RIGHT ELBOW  Final   Gram Stain   Final    MODERATE WBC PRESENT, PREDOMINANTLY PMN NO ORGANISMS SEEN    Culture   Final    NO GROWTH < 24 HOURS Performed at [facility], [address], [place],  [address]    Report Status PENDING  Incomplete  Anaerobic culture w Gram Stain     Status: None (Preliminary result)   Collection Time: 11/17/21  7:32 PM   Specimen: Synovium; Synovial Fluid  Result Value Ref Range Status   Specimen  Description SYNOVIAL  Final   Special Requests RIGHT ELBOW  Final   Gram Stain   Final    MODERATE WBC PRESENT, PREDOMINANTLY PMN NO ORGANISMS SEEN Performed  at [facility], [place] [address], [place], [place] [address]    Culture PENDING  Incomplete   Report Status PENDING  Incomplete      Radiology Studies: CT ELBOW RIGHT WO CONTRAST  Result Date: 11/17/2021 CLINICAL DATA:  Soft tissue infection suspected.  Elbow pain. EXAM: CT OF THE UPPER RIGHT EXTREMITY WITHOUT CONTRAST TECHNIQUE: Multidetector CT imaging of the right elbow was performed according to the standard protocol. RADIATION DOSE REDUCTION: This exam was performed according to the departmental dose-optimization program which includes automated exposure control, adjustment of the mA and/or kV according to patient size and/or use of iterative reconstruction technique. COMPARISON:  Radiographs 11/16/2021 FINDINGS: Bones/Joint/Cartilage Examination was performed with the patient's elbow by his side. Resulting beam hardening artifact limits image quality. No evidence of acute fracture, dislocation or bone destruction. There are ring osteophytes of the radial neck. Osteophytes and subchondral cyst formation are present within the coronoid process. There is a small to moderate nonspecific elbow joint effusion. Ligaments Suboptimally assessed by CT. Muscles and Tendons As evaluated by noncontrast CT, the muscles and tendons about the elbow appear unremarkable. Triceps and biceps tendons appear intact. Soft tissues Suspected soft tissue edema about the elbow, especially posteriorly and laterally. No focal fluid collection, foreign body or soft tissue emphysema identified on noncontrast imaging. IMPRESSION: 1. No evidence of acute fracture, dislocation or bone destruction. 2. Nonspecific elbow joint effusion which could be secondary to underlying arthritis. Septic arthritis not excluded. 3. Suspected soft tissue edema about the elbow, especially posteriorly and laterally, which could reflect superficial soft tissue infection (cellulitis). No focal fluid collection, foreign body or soft tissue emphysema  identified on noncontrast imaging. Electronically Signed   By: Richardean Sale M.D.   On: 11/17/2021 13:55       LOS: 3 days   [facility] Pager on [email or web address]  11/19/2021, 10:18 AM

## 2021-11-20 ENCOUNTER — Other Ambulatory Visit: Payer: Self-pay

## 2021-11-20 ENCOUNTER — Encounter (HOSPITAL_COMMUNITY): Payer: Self-pay | Admitting: Physical Medicine & Rehabilitation

## 2021-11-20 ENCOUNTER — Inpatient Hospital Stay (HOSPITAL_REHABILITATION)
Admission: RE | Admit: 2021-11-20 | Payer: Medicare Other | Source: Intra-hospital | Admitting: Physical Medicine & Rehabilitation

## 2021-11-20 ENCOUNTER — Encounter (HOSPITAL_COMMUNITY): Payer: Self-pay | Admitting: Internal Medicine

## 2021-11-20 ENCOUNTER — Inpatient Hospital Stay (HOSPITAL_COMMUNITY)
Admission: RE | Admit: 2021-11-20 | Discharge: 2021-11-28 | DRG: 057 | Disposition: A | Discharge status: Home Health | Payer: Medicare Other | Source: Intra-hospital | Attending: Physical Medicine and Rehabilitation | Admitting: Physical Medicine and Rehabilitation

## 2021-11-20 DIAGNOSIS — F1010 Alcohol abuse, uncomplicated: Secondary | ICD-10-CM | POA: Diagnosis present

## 2021-11-20 DIAGNOSIS — L89312 Pressure ulcer of right buttock, stage 2: Secondary | ICD-10-CM | POA: Diagnosis present

## 2021-11-20 DIAGNOSIS — K219 Gastro-esophageal reflux disease without esophagitis: Secondary | ICD-10-CM | POA: Diagnosis present

## 2021-11-20 DIAGNOSIS — G4730 Sleep apnea, unspecified: Secondary | ICD-10-CM | POA: Diagnosis present

## 2021-11-20 DIAGNOSIS — E519 Thiamine deficiency, unspecified: Secondary | ICD-10-CM | POA: Diagnosis present

## 2021-11-20 DIAGNOSIS — M10021 Idiopathic gout, right elbow: Secondary | ICD-10-CM | POA: Diagnosis not present

## 2021-11-20 DIAGNOSIS — R4189 Other symptoms and signs involving cognitive functions and awareness: Secondary | ICD-10-CM | POA: Diagnosis present

## 2021-11-20 DIAGNOSIS — M109 Gout, unspecified: Secondary | ICD-10-CM | POA: Diagnosis present

## 2021-11-20 DIAGNOSIS — I69398 Other sequelae of cerebral infarction: Secondary | ICD-10-CM

## 2021-11-20 DIAGNOSIS — N3281 Overactive bladder: Secondary | ICD-10-CM | POA: Diagnosis present

## 2021-11-20 DIAGNOSIS — L89321 Pressure ulcer of left buttock, stage 1: Secondary | ICD-10-CM | POA: Diagnosis present

## 2021-11-20 DIAGNOSIS — R2689 Other abnormalities of gait and mobility: Secondary | ICD-10-CM | POA: Diagnosis present

## 2021-11-20 DIAGNOSIS — Z87891 Personal history of nicotine dependence: Secondary | ICD-10-CM | POA: Diagnosis not present

## 2021-11-20 DIAGNOSIS — R5381 Other malaise: Secondary | ICD-10-CM | POA: Diagnosis present

## 2021-11-20 DIAGNOSIS — I69354 Hemiplegia and hemiparesis following cerebral infarction affecting left non-dominant side: Secondary | ICD-10-CM | POA: Diagnosis not present

## 2021-11-20 DIAGNOSIS — G4700 Insomnia, unspecified: Secondary | ICD-10-CM | POA: Diagnosis present

## 2021-11-20 DIAGNOSIS — I639 Cerebral infarction, unspecified: Secondary | ICD-10-CM | POA: Diagnosis not present

## 2021-11-20 DIAGNOSIS — L8990 Pressure ulcer of unspecified site, unspecified stage: Secondary | ICD-10-CM | POA: Insufficient documentation

## 2021-11-20 DIAGNOSIS — K589 Irritable bowel syndrome without diarrhea: Secondary | ICD-10-CM | POA: Diagnosis present

## 2021-11-20 DIAGNOSIS — Z79899 Other long term (current) drug therapy: Secondary | ICD-10-CM | POA: Diagnosis not present

## 2021-11-20 LAB — CBC
HCT: 38.8 % — ABNORMAL LOW (ref 39.0–52.0)
Hemoglobin: 13.1 g/dL (ref 13.0–17.0)
MCH: 31 pg (ref 26.0–34.0)
MCHC: 33.8 g/dL (ref 30.0–36.0)
MCV: 91.7 fL (ref 80.0–100.0)
Platelets: 401 10*3/uL — ABNORMAL HIGH (ref 150–400)
RBC: 4.23 MIL/uL (ref 4.22–5.81)
RDW: 12.8 % (ref 11.5–15.5)
WBC: 8.3 10*3/uL (ref 4.0–10.5)
nRBC: 0 % (ref 0.0–0.2)

## 2021-11-20 LAB — CULTURE, BLOOD (ROUTINE X 2)
Culture: NO GROWTH
Culture: NO GROWTH
Special Requests: ADEQUATE
Special Requests: ADEQUATE

## 2021-11-20 LAB — CREATININE, SERUM
Creatinine, Ser: 0.83 mg/dL (ref 0.61–1.24)
GFR, Estimated: >60 mL/min (ref >60)

## 2021-11-20 MED ORDER — ADULT MULTIVITAMIN W/MINERALS CH
1.0000 | ORAL_TABLET | Freq: Every day | ORAL | Status: DC
  Administered 2021-11-21 – 2021-11-28 (×8): 1 via ORAL
  Filled 2021-11-20 (×8): qty 1

## 2021-11-20 MED ORDER — ATORVASTATIN CALCIUM 40 MG PO TABS
40.0000 mg | ORAL_TABLET | Freq: Every day | ORAL | Status: DC
  Administered 2021-11-21 – 2021-11-28 (×8): 40 mg via ORAL
  Filled 2021-11-20 (×8): qty 1

## 2021-11-20 MED ORDER — ONDANSETRON HCL 4 MG/2ML IJ SOLN: 4.0000 mg | Freq: Four times a day (QID) | INTRAMUSCULAR | Status: DC | PRN

## 2021-11-20 MED ORDER — BACLOFEN 10 MG PO TABS: 5.0000 mg | ORAL_TABLET | Freq: Two times a day (BID) | ORAL | Status: DC

## 2021-11-20 MED ORDER — ONDANSETRON HCL 4 MG PO TABS: 4.0000 mg | ORAL_TABLET | Freq: Four times a day (QID) | ORAL | Status: DC | PRN

## 2021-11-20 MED ORDER — ASPIRIN 81 MG PO CHEW
81.0000 mg | CHEWABLE_TABLET | Freq: Every day | ORAL | Status: DC
  Administered 2021-11-21 – 2021-11-28 (×8): 81 mg via ORAL
  Filled 2021-11-20 (×9): qty 1

## 2021-11-20 MED ORDER — PREDNISONE 20 MG PO TABS
20.0000 mg | ORAL_TABLET | Freq: Every day | ORAL | Status: DC
  Administered 2021-11-20: 20 mg via ORAL
  Filled 2021-11-20: qty 1

## 2021-11-20 MED ORDER — OXYBUTYNIN CHLORIDE ER 10 MG PO TB24
10.0000 mg | ORAL_TABLET | Freq: Every day | ORAL | Status: DC
  Administered 2021-11-20 – 2021-11-27 (×8): 10 mg via ORAL
  Filled 2021-11-20 (×8): qty 1

## 2021-11-20 MED ORDER — DOCUSATE SODIUM 100 MG PO CAPS
100.0000 mg | ORAL_CAPSULE | Freq: Two times a day (BID) | ORAL | Status: DC
  Administered 2021-11-20 – 2021-11-28 (×16): 100 mg via ORAL
  Filled 2021-11-20 (×16): qty 1

## 2021-11-20 MED ORDER — PREDNISONE 20 MG PO TABS
20.0000 mg | ORAL_TABLET | Freq: Every day | ORAL | Status: AC
  Administered 2021-11-21 – 2021-11-25 (×5): 20 mg via ORAL
  Filled 2021-11-20 (×5): qty 1

## 2021-11-20 MED ORDER — OXYBUTYNIN CHLORIDE ER 5 MG PO TB24: 10.0000 mg | ORAL_TABLET | Freq: Every day | ORAL | Status: DC

## 2021-11-20 MED ORDER — PANTOPRAZOLE SODIUM 40 MG PO TBEC
40.0000 mg | DELAYED_RELEASE_TABLET | Freq: Every day | ORAL | Status: DC
  Administered 2021-11-21 – 2021-11-28 (×8): 40 mg via ORAL
  Filled 2021-11-20 (×8): qty 1

## 2021-11-20 MED ORDER — MAGNESIUM CITRATE PO SOLN
1.0000 | Freq: Once | ORAL | Status: AC | PRN
  Administered 2021-11-27: 1 via ORAL
  Filled 2021-11-20: qty 296

## 2021-11-20 MED ORDER — VITAMIN C 500 MG PO TABS
500.0000 mg | ORAL_TABLET | Freq: Every day | ORAL | Status: DC
  Filled 2021-11-20: qty 1

## 2021-11-20 MED ORDER — VITAMIN C 500 MG PO TABS
500.0000 mg | ORAL_TABLET | Freq: Every day | ORAL | Status: DC
  Administered 2021-11-21 – 2021-11-28 (×8): 500 mg via ORAL
  Filled 2021-11-20 (×8): qty 1

## 2021-11-20 MED ORDER — DOXYCYCLINE HYCLATE 100 MG PO TABS
100.0000 mg | ORAL_TABLET | Freq: Two times a day (BID) | ORAL | Status: AC
  Administered 2021-11-20 – 2021-11-23 (×7): 100 mg via ORAL
  Filled 2021-11-20 (×7): qty 1

## 2021-11-20 MED ORDER — BISACODYL 5 MG PO TBEC
5.0000 mg | DELAYED_RELEASE_TABLET | Freq: Every day | ORAL | Status: DC | PRN
  Administered 2021-11-26: 5 mg via ORAL
  Filled 2021-11-20: qty 1

## 2021-11-20 MED ORDER — GUAIFENESIN-DM 100-10 MG/5ML PO SYRP: 5.0000 mL | ORAL_SOLUTION | Freq: Four times a day (QID) | ORAL | Status: DC | PRN

## 2021-11-20 NOTE — Discharge Summary (Signed)
[facility]  Physician Discharge Summary   Patient ID: Jose [name] MRN: [id number] DOB/AGE: 06-11-1935 86 y.o.  Admit date: 11/14/2021 Discharge date:   11/20/2021   PCP: Hoyt Koch, MD  DISCHARGE DIAGNOSES:    Acute CVA (cerebrovascular accident) ([place])   Thiamine deficiency   GERD (gastroesophageal reflux disease)   Alcohol abuse   Acute encephalopathy   Pain in right elbow secondary to acute gout  PATIENT BEING DISCHARGED/TRANSFERRED TO [place] [facility]  RECOMMENDATIONS FOR OUTPATIENT FOLLOW UP: Patient needs to be followed up by neurology and 6 to 8 weeks for acute stroke.  Please send ambulatory referral at discharge from inpatient rehabilitation. Prednisone for 4 more days starting 10/29.  Course of doxycycline to end on 11/1.    Home Health: To inpatient rehabilitation Equipment/Devices: None  CODE STATUS: Full code  DISCHARGE CONDITION: fair  Diet recommendation: Heart healthy  INITIAL HISTORY: 86 year old Caucasian male with past medical history of alcohol abuse, GERD, thiamine deficiency, ataxia presented with generalized pain.  Found to be encephalopathic.  Work-up revealed acute stroke.  He was transferred to [facility] for further management.   Consultants: Neurology.  Orthopedics, Dr. Laurance Flatten with [facility]   Procedures:  Left elbow arthrocentesis Transthoracic echocardiogram EEG   HOSPITAL COURSE:   Wernicke encephalopathy Apparently has had increased confusion and weakness for several days prior to admission.  There was concern for significant thiamine deficiency/Warnicke's.   He was started on high-dose thiamine in the emergency department.  Seen by neurology.  Underwent MRI which showed acute stroke.  See below. B12 level 923, folate 17.6, TSH 2.5. WBC was noted to be elevated.  No evidence of infection on UA.  Chest x-ray more consistent with atelectasis rather than pneumonia.   Procalcitonin level was 0.65.  Lactic acid was normal. Still noted to be confused at times though appears to be gradually improving.     Acute CVA Acute infarct in the right corona radiata noted on MRI.   Neurology recommends aspirin and statin daily.   LDL 71.  HbA1c 5.3.  Echocardiogram shows normal systolic function. EEG did not show any epileptiform activity. PT and OT evaluation.  Inpatient rehabilitation is recommended. Patient on aspirin and statin.   Right elbow pain/acute gout Swelling of the right elbow area around with some erythema and warmth noted.  X-ray was done which did not show any obvious fractures.  Patient was quite tender in that area.  Plain films did not show any acute abnormalities.  CT of the elbow was subsequently done which showed joint effusion and soft tissue swelling.  Uric acid level was normal at 4.5.  ESR and CRP was significantly high.  WBC was noted to be high as well. Orthopedics was subsequently consulted.  Patient underwent joint aspiration on 10/26.  Gram stain does not show any organisms.  He does have intracellular monosodium urate crystals.  Synovial fluid more consistent with acute gout and inflammatory arthritis rather than infectious arthritis.  Cultures without any growth so far. Cannot use NSAIDs due to acute stroke and the fact that he is on aspirin.  Was started on low-dose prednisone.  Pain has significantly improved.  Mobility is also improved.  Will give daily prednisone for 4 more days. Continue with doxycycline due to concern for cellulitis.  We will plan for 7-day course.    Normal anion gap metabolic acidosis Resolved.   Hypokalemia/hyponatremia Supplemented.    History of alcohol use Was drinking up until 4 to  5 days prior to admission.  No evidence for withdrawal.    Thiamine deficiency Thiamine level was low in June at 43.  Level from 11/14/2021 is 117.  Patient was started on high-dose thiamine supplementation.  Will need to  continue with thiamine supplementation at discharge.  Patient is stable.  Okay for discharge to inpatient rehabilitation.   PERTINENT LABS:  The results of significant diagnostics from this hospitalization (including imaging, microbiology, ancillary and laboratory) are listed below for reference.    Microbiology: Recent Results (from the past 240 hour(s))  Urine Culture     Status: None   Collection Time: 11/14/21 10:24 PM   Specimen: Urine, Clean Catch  Result Value Ref Range Status   Specimen Description   Final    URINE, CLEAN CATCH Performed at [facility], [place] [address], [place], [place] [address]    Special Requests   Final    NONE Performed at [facility] [place], [place] [address], [place], [place] [address]    Culture   Final    NO GROWTH Performed at [facility], [place] [address] [place], [place] [address]    Report Status 11/16/2021 FINAL  Final  Culture, blood (Routine X 2) w Reflex to ID Panel     Status: None (Preliminary result)   Collection Time: 11/15/21  9:40 AM   Specimen: BLOOD  Result Value Ref Range Status   Specimen Description   Final    BLOOD BLOOD LEFT WRIST Performed at [place] [address] [place], [place] [address]    Special Requests   Final    BOTTLES DRAWN AEROBIC AND ANAEROBIC Blood Culture adequate volume Performed at [facility] [address], [place], [place] [address]    Culture   Final    NO GROWTH 4 DAYS Performed at [facility], [place] [address], [place], [place] [address]    Report Status PENDING  Incomplete  Culture, blood (Routine X 2) w Reflex to ID Panel     Status: None (Preliminary result)   Collection Time: 11/15/21  9:45 AM   Specimen: BLOOD  Result Value Ref Range Status   Specimen Description   Final    BLOOD RIGHT ANTECUBITAL Performed at [facility] [address], [place], [place] [address]    Special  Requests   Final    BOTTLES DRAWN AEROBIC AND ANAEROBIC Blood Culture adequate volume Performed at [facility] [address] [place], [place] [address]    Culture   Final    NO GROWTH 4 DAYS Performed at [facility], [place] [address] [place], [place] [address]    Report Status PENDING  Incomplete  Body fluid culture w Gram Stain     Status: None (Preliminary result)   Collection Time: 11/17/21  7:32 PM   Specimen: Synovium; Synovial Fluid  Result Value Ref Range Status   Specimen Description SYNOVIAL  Final   Special Requests RIGHT ELBOW  Final   Gram Stain   Final    MODERATE WBC PRESENT, PREDOMINANTLY PMN NO ORGANISMS SEEN    Culture   Final    NO GROWTH 2 DAYS Performed at [facility], [address] [place],  [address]    Report Status PENDING  Incomplete  Anaerobic culture w Gram Stain     Status: None (Preliminary result)   Collection Time: 11/17/21  7:32 PM   Specimen: Synovium;  Synovial Fluid  Result Value Ref Range Status   Specimen Description SYNOVIAL  Final   Special Requests RIGHT ELBOW  Final   Gram Stain   Final    MODERATE WBC PRESENT, PREDOMINANTLY PMN NO ORGANISMS SEEN Performed at [facility], [address], [place], [place] [address]    Culture   Final    NO ANAEROBES ISOLATED; CULTURE IN PROGRESS FOR 5 DAYS   Report Status PENDING  Incomplete     Labs:   Basic Metabolic Panel: Recent Labs  Lab 11/14/21 1345 11/15/21 0945 11/16/21 0625 11/17/21 0852 11/19/21 0246  NA 135 135 135 132* 137  K 4.1 3.7 3.4* 3.5 3.6  CL 99 103 101 96* 102  CO2 22 20* 21* 21* 24  GLUCOSE 126* 90 95 86 115*  BUN _0 CREATININE 1.04 0.79 0.81 0.75 0.81  CALCIUM 9.6 9.2 9.4 9.2 9.7  MG  --  2.0 1.9 1.9  --   PHOS  --  3.4 3.0  --   --    Liver Function Tests: Recent Labs  Lab 11/14/21 1345 11/15/21 0945 11/16/21 0625  AST _1 ALT _2 ALKPHOS 63 55 47  BILITOT 1.3* 1.2 1.1  PROT  7.4 6.8 5.9*  ALBUMIN 3.7 3.2* 2.6*   Recent Labs  Lab 11/14/21 1345  LIPASE 39   Recent Labs  Lab 11/14/21 1345  AMMONIA 13   CBC: Recent Labs  Lab 11/14/21 1345 11/15/21 0945 11/16/21 0625 11/19/21 0246  WBC 14.7* 10.8* 11.9* 7.2  NEUTROABS 10.4* 7.5 8.1*  --   HGB 15.9 14.5 13.3 12.5*  HCT 48.8 43.9 38.0* 35.7*  MCV 94.4 92.6 90.0 88.1  PLT 337 295 276 357     IMAGING STUDIES CT ELBOW RIGHT WO CONTRAST  Result Date: 11/17/2021 CLINICAL DATA:  Soft tissue infection suspected.  Elbow pain. EXAM: CT OF THE UPPER RIGHT EXTREMITY WITHOUT CONTRAST TECHNIQUE: Multidetector CT imaging of the right elbow was performed according to the standard protocol. RADIATION DOSE REDUCTION: This exam was performed according to the departmental dose-optimization program which includes automated exposure control, adjustment of the mA and/or kV according to patient size and/or use of iterative reconstruction technique. COMPARISON:  Radiographs 11/16/2021 FINDINGS: Bones/Joint/Cartilage Examination was performed with the patient's elbow by his side. Resulting beam hardening artifact limits image quality. No evidence of acute fracture, dislocation or bone destruction. There are ring osteophytes of the radial neck. Osteophytes and subchondral cyst formation are present within the coronoid process. There is a small to moderate nonspecific elbow joint effusion. Ligaments Suboptimally assessed by CT. Muscles and Tendons As evaluated by noncontrast CT, the muscles and tendons about the elbow appear unremarkable. Triceps and biceps tendons appear intact. Soft tissues Suspected soft tissue edema about the elbow, especially posteriorly and laterally. No focal fluid collection, foreign body or soft tissue emphysema identified on noncontrast imaging. IMPRESSION: 1. No evidence of acute fracture, dislocation or bone destruction. 2. Nonspecific elbow joint effusion which could be secondary to underlying arthritis.  Septic arthritis not excluded. 3. Suspected soft tissue edema about the elbow, especially posteriorly and laterally, which could reflect superficial soft tissue infection (cellulitis). No focal fluid collection, foreign body or soft tissue emphysema identified on noncontrast imaging. Electronically Signed   By: Richardean Sale M.D.   On: 11/17/2021 13:55   DG Elbow 2 Views Right  Result Date: 11/16/2021 CLINICAL DATA:  Right elbow pain. EXAM: RIGHT ELBOW -  2 VIEW COMPARISON:  None Available. FINDINGS: Examination is significantly degraded due to lack a lateral projection radiograph. Given this limitation, no definite displaced fractures identified with special attention paid to the radial head. Nonspecific diffuse soft tissue swelling about the elbow. No radiopaque foreign body. Nondiagnostic evaluation for a joint effusion. Mild degenerative change of the ulnar humeral joint is suspected though incompletely evaluated. IMPRESSION: 1. Degraded examination secondary to lack of a lateral projection radiograph. 2. Nonspecific diffuse soft tissue swelling about the elbow without displaced fracture with special attention paid to the radial head. If clinical concern persists, further evaluation with elbow CT could be performed as indicated. 3. Mild degenerative change of the ulnar humeral joint is suspected though incompletely evaluated. Electronically Signed   By: Sandi Mariscal M.D.   On: 11/16/2021 12:43   EEG adult  Result Date: 11/15/2021 Lora Havens, MD     11/15/2021  6:04 PM Patient Name: Jose [name] MRN: [id number] Epilepsy Attending: Lora Havens Referring Physician/Provider: Marcelyn Bruins, MD Date: 11/15/2021 Duration: 25.25 mins Patient history: 86yo M with ams. EEG to evaluate for seizure Level of alertness: Awake AEDs during EEG study: None Technical aspects: This EEG study was done with scalp electrodes positioned according to the 10-20 International system of electrode  placement. Electrical activity was reviewed with band pass filter of 1-70Hz, sensitivity of 7 uV/mm, display speed of 45m/sec with a 60Hz notched filter applied as appropriate. EEG data were recorded continuously and digitally stored.  Video monitoring was available and reviewed as appropriate. Description: The posterior dominant rhythm consists of 9 Hz activity of moderate voltage (25-35 uV) seen predominantly in posterior head regions, symmetric and reactive to eye opening and eye closing. EEG showed intermittent generalized 3 to 6 Hz theta-delta slowing. Hyperventilation and photic stimulation were not performed.   ABNORMALITY - Intermittent slow, generalized IMPRESSION: This study is suggestive of mild diffuse encephalopathy, nonspecific etiology. No seizures or epileptiform discharges were seen throughout the recording. PLora Havens  ECHOCARDIOGRAM COMPLETE  Result Date: 11/15/2021    ECHOCARDIOGRAM REPORT   Patient Name:   Jose FACKLERDate of Exam: 11/15/2021 Medical Rec #:  [id number]            Height:       64.0 in Accession #:    [id number]           Weight:       157.0 lb Date of Birth:  4Mar 22, 1937             BSA:          1.765 m Patient Age:    89years              BP:           143/89 mmHg Patient Gender: M                     HR:           120 bpm. Exam Location:  Inpatient Procedure: 2D Echo, Cardiac Doppler and Color Doppler Indications:    Stroke I63.9  History:        Patient has no prior history of Echocardiogram examinations.  Sonographer:    SBernadene PersonRDCS Referring Phys: [id number] [place] 1. Left ventricular ejection fraction, by estimation, is 65 to 70%. The left ventricle has normal function. The left ventricle has no regional wall motion abnormalities. Indeterminate diastolic  filling due to E-A fusion.  2. Right ventricular systolic function is normal. The right ventricular size is normal. There is normal pulmonary artery systolic pressure.  The estimated right ventricular systolic pressure is 89.3 mmHg.  3. The mitral valve is grossly normal. Trivial mitral valve regurgitation. No evidence of mitral stenosis.  4. The aortic valve is tricuspid. There is mild calcification of the aortic valve. There is mild thickening of the aortic valve. Aortic valve regurgitation is not visualized. Aortic valve sclerosis is present, with no evidence of aortic valve stenosis.  5. The inferior vena cava is normal in size with greater than 50% respiratory variability, suggesting right atrial pressure of 3 mmHg. Conclusion(s)/Recommendation(s): No intracardiac source of embolism detected on this transthoracic study. Consider a transesophageal echocardiogram to exclude cardiac source of embolism if clinically indicated. FINDINGS  Left Ventricle: Left ventricular ejection fraction, by estimation, is 65 to 70%. The left ventricle has normal function. The left ventricle has no regional wall motion abnormalities. The left ventricular internal cavity size was normal in size. There is  no left ventricular hypertrophy. Indeterminate diastolic filling due to E-A fusion. Right Ventricle: The right ventricular size is normal. No increase in right ventricular wall thickness. Right ventricular systolic function is normal. There is normal pulmonary artery systolic pressure. The tricuspid regurgitant velocity is 2.31 m/s, and  with an assumed right atrial pressure of 3 mmHg, the estimated right ventricular systolic pressure is 73.4 mmHg. Left Atrium: Left atrial size was normal in size. Right Atrium: Right atrial size was normal in size. Pericardium: There is no evidence of pericardial effusion. Mitral Valve: The mitral valve is grossly normal. Trivial mitral valve regurgitation. No evidence of mitral valve stenosis. Tricuspid Valve: The tricuspid valve is grossly normal. Tricuspid valve regurgitation is mild . No evidence of tricuspid stenosis. Aortic Valve: The aortic valve is  tricuspid. There is mild calcification of the aortic valve. There is mild thickening of the aortic valve. Aortic valve regurgitation is not visualized. Aortic valve sclerosis is present, with no evidence of aortic valve stenosis. Pulmonic Valve: The pulmonic valve was grossly normal. Pulmonic valve regurgitation is trivial. No evidence of pulmonic stenosis. Aorta: The aortic root and ascending aorta are structurally normal, with no evidence of dilitation. Venous: The inferior vena cava is normal in size with greater than 50% respiratory variability, suggesting right atrial pressure of 3 mmHg. IAS/Shunts: The atrial septum is grossly normal.  LEFT VENTRICLE PLAX 2D LVIDd:         3.70 cm LVIDs:         2.30 cm LV PW:         0.90 cm LV IVS:        1.10 cm LVOT diam:     2.00 cm LV SV:         26 LV SV Index:   15 LVOT Area:     3.14 cm  LV Volumes (MOD) LV vol d, MOD A2C: 57.8 ml LV vol d, MOD A4C: 70.0 ml LV vol s, MOD A2C: 23.1 ml LV vol s, MOD A4C: 27.8 ml LV SV MOD A2C:     34.7 ml LV SV MOD A4C:     70.0 ml LV SV MOD BP:      36.6 ml RIGHT VENTRICLE RV S prime:     13.20 cm/s TAPSE (M-mode): 2.3 cm LEFT ATRIUM             Index        RIGHT  ATRIUM           Index LA diam:        2.40 cm 1.36 cm/m   RA Area:     13.40 cm LA Vol (A2C):   19.4 ml 10.99 ml/m  RA Volume:   28.30 ml  16.03 ml/m LA Vol (A4C):   24.8 ml 14.05 ml/m LA Biplane Vol: 24.1 ml 13.65 ml/m  AORTIC VALVE LVOT Vmax:   69.47 cm/s LVOT Vmean:  44.500 cm/s LVOT VTI:    0.084 m  AORTA Ao Root diam: 3.50 cm Ao Asc diam:  3.50 cm TRICUSPID VALVE TR Peak grad:   21.3 mmHg TR Vmax:        231.00 cm/s  SHUNTS Systemic VTI:  0.08 m Systemic Diam: 2.00 cm Eleonore Chiquito MD Electronically signed by Eleonore Chiquito MD Signature Date/Time: 11/15/2021/9:28:56 AM    Final    MR MRA NECK W CONTRAST  Result Date: 11/15/2021 CLINICAL DATA:  Stroke follow-up EXAM: MRA NECK WITHOUT AND WITH CONTRAST TECHNIQUE: Multiplanar and multiecho pulse sequences of the  neck were obtained without and with intravenous contrast. Angiographic images of the neck were obtained using MRA technique without and with intravenous contrast. CONTRAST:  31m GADAVIST GADOBUTROL 1 MMOL/ML IV SOLN COMPARISON:  None Available. FINDINGS: Aortic arch: Normal 3 vessel branching pattern. Right carotid system: No occlusion or stenosis. Left carotid system: No occlusion or stenosis. Vertebral arteries: Codominant system. No occlusion or stenosis. Both vertebral arteries originate from the subclavian arteries. Normal configuration of the vertebrobasilar confluence. IMPRESSION: Normal MRA neck Electronically Signed   By: KUlyses JarredM.D.   On: 11/15/2021 00:54   DG CHEST PORT 1 VIEW  Result Date: 11/15/2021 CLINICAL DATA:  Encephalopathy. EXAM: PORTABLE CHEST 1 VIEW COMPARISON:  Chest radiograph dated 05/17/2020. FINDINGS: There is left lung base atelectasis or infiltrate. Bilateral perihilar densities may represent mild congestion. No pleural effusion pneumothorax. The cardiac silhouette is within limits. Atherosclerotic calcification of the aorta. No acute osseous pathology. IMPRESSION: Left lung base atelectasis or infiltrate. Electronically Signed   By: AAnner CreteM.D.   On: 11/15/2021 00:02   MR ANGIO HEAD WO CONTRAST  Result Date: 11/14/2021 CLINICAL DATA:  Acute neurologic deficit EXAM: MRA HEAD WITHOUT CONTRAST TECHNIQUE: Angiographic images of the Circle of Willis were acquired using MRA technique without intravenous contrast. COMPARISON:  None Available. FINDINGS: POSTERIOR CIRCULATION: --Vertebral arteries: Normal --Inferior cerebellar arteries: Normal. --Basilar artery: Normal. --Superior cerebellar arteries: Normal. --Posterior cerebral arteries: Normal. ANTERIOR CIRCULATION: --Intracranial internal carotid arteries: Normal. --Anterior cerebral arteries (ACA): Normal. --Middle cerebral arteries (MCA): Normal. ANATOMIC VARIANTS: None IMPRESSION: Normal intracranial MRA.  Electronically Signed   By: KUlyses JarredM.D.   On: 11/14/2021 23:46   MR BRAIN WO CONTRAST  Result Date: 11/14/2021 CLINICAL DATA:  Follow-up questionable pontine lesion seen EXAM: MRI HEAD WITHOUT CONTRAST TECHNIQUE: Multiplanar, multiecho pulse sequences of the brain and surrounding structures were obtained without intravenous contrast. COMPARISON:  Same-day CT head, brain MRI 05/18/2020 FINDINGS: Brain: There is a small focus of diffusion restriction in the right corona radiata with associated FLAIR signal abnormality consistent with small acute infarct. There is no associated hemorrhage or mass effect. There is no other evidence of acute infarct. Specifically, there is no acute abnormality in the pons. There is no acute intracranial hemorrhage or extra-axial fluid collection. There is unchanged background parenchymal volume loss with prominence of the ventricular system and extra-axial CSF spaces. FLAIR signal abnormality in the supratentorial white matter is  nonspecific but likely reflects sequela of chronic small vessel ischemic change, unchanged since the prior MRI from 2022. Punctate chronic microhemorrhages in the right frontal lobe are unchanged (10-46). There is no solid mass lesion. There is no mass effect or midline shift. Vascular: Normal flow voids. Skull and upper cervical spine: Normal marrow signal. Sinuses/Orbits: The paranasal sinuses are clear. Bilateral lens implants are in place. The globes and orbits are otherwise unremarkable. Other: None. IMPRESSION: 1. Small acute infarct in the right corona radiata without hemorrhage or mass effect. 2. No other acute intracranial pathology. No acute finding in the pons as questioned on the prior CT. Electronically Signed   By: Valetta Mole M.D.   On: 11/14/2021 19:42   CT Head Wo Contrast  Result Date: 11/14/2021 CLINICAL DATA:  Altered mental status EXAM: CT HEAD WITHOUT CONTRAST TECHNIQUE: Contiguous axial images were obtained from the base  of the skull through the vertex without intravenous contrast. RADIATION DOSE REDUCTION: This exam was performed according to the departmental dose-optimization program which includes automated exposure control, adjustment of the mA and/or kV according to patient size and/or use of iterative reconstruction technique. COMPARISON:  CT head 05/17/2020 FINDINGS: Brain: No evidence of hemorrhage, extra-axial collection or mass lesion/mass effect. There is ventriculomegaly which is likely related to the degree of volume loss. There is a new focal hypodense lesion in the upper pons (series 2, image 13). Sequela of severe chronic microvascular ischemic change and advanced generalized volume loss. Vascular: No hyperdense vessel or unexpected calcification. Skull: Normal. Negative for fracture or focal lesion. Sinuses/Orbits: Bilateral lens replacement. Other: None. IMPRESSION: New focal hypodense lesion in the ventral pons on the left may be artifactual or represent an age-indeterminate infarct. Recommend MRI for further evaluation. Electronically Signed   By: Marin Roberts M.D.   On: 11/14/2021 16:34    DISCHARGE EXAMINATION: Vitals:   11/19/21 1511 11/19/21 2000 11/20/21 0000 11/20/21 0854  BP: 120/73 124/72 121/68 139/83  Pulse: 92 91 98 (!) 101  Resp: _0 Temp: 98.1 F (36.7 C) 98.1 F (36.7 C) 98.2 F (36.8 C) 98.3 F (36.8 C)  TempSrc: Oral Axillary Axillary Oral  SpO2: 95% 96% 96% 98%  Weight:      Height:       General appearance: Awake alert.  In no distress Resp: Clear to auscultation bilaterally.  Normal effort Cardio: S1-S2 is normal regular.  No S3-S4.  No rubs murmurs or bruit GI: Abdomen is soft.  Nontender nondistended.  Bowel sounds are present normal.  No masses organomegaly Extremities: Improved range of motion of the right elbow    DISPOSITION: CIR     Current Inpatient Medications: Scheduled:  ascorbic acid  500 mg Oral Daily   aspirin EC  81 mg Oral Daily    atorvastatin  40 mg Oral Daily   baclofen  5 mg Oral BID   doxycycline  100 mg Oral Q12H   enoxaparin (LOVENOX) injection  40 mg Subcutaneous Q24H   feeding supplement  237 mL Oral BID BM   multivitamin with minerals  1 tablet Oral Daily   oxybutynin  10 mg Oral QHS   pantoprazole  40 mg Oral Daily   predniSONE  20 mg Oral Q breakfast   thiamine  250 mg Oral Daily   Followed by   Derrill Memo ON 11/23/2021] thiamine  100 mg Oral Daily   Continuous: AST:[id number] **OR** acetaminophen (TYLENOL) oral liquid 160 mg/5 mL **OR** acetaminophen, senna-docusate, traMADol  TOTAL DISCHARGE TIME: 35 mins   [employer] on [email or web address]  11/20/2021, 9:21 AM

## 2021-11-20 NOTE — Progress Notes (Addendum)
Inpatient Rehabilitation Admission Medication Review by a Pharmacist  A complete drug regimen review was completed for this patient to identify any potential clinically significant medication issues.  High Risk Drug Classes Is patient taking? Indication by Medication  Antipsychotic No   Anticoagulant No   Antibiotic Yes Doxycycline - cellulitis  Opioid No   Antiplatelet Yes ASA - CVA  Hypoglycemics/insulin No   Vasoactive Medication No   Chemotherapy No   Other Yes Lipitor - HLD Protonix - GERD Ditropan - BPH Prednisone - gout Vitamin C - supplement     Type of Medication Issue Identified Description of Issue Recommendation(s)  Drug Interaction(s) (clinically significant)     Duplicate Therapy     Allergy     No Medication Administration End Date     Incorrect Dose     Additional Drug Therapy Needed  Thiamine for Wernicke and Lovenox for DVT px Message MD  Significant med changes from prior encounter (inform family/care partners about these prior to discharge).    Other       Clinically significant medication issues were identified that warrant physician communication and completion of prescribed/recommended actions by midnight of the next day:  Yes  Name of provider notified for urgent issues identified: Dr Read Drivers  Provider Method of Notification: secure chat    Pharmacist comments:   Time spent performing this drug regimen review (minutes):  [place], PharmD, [place], AAHIVP, CPP Infectious Disease Pharmacist 11/20/2021 4:29 PM

## 2021-11-20 NOTE — Progress Notes (Signed)
PMR Admission Coordinator Pre-Admission Assessment   Patient: Jose [name] is an 86 y.o., male MRN: [id number] DOB: Jul 15, 1935 Height: _0  (162.6 cm) Weight: 71.2 kg   Insurance Information HMO:     PPO:      PCP:      IPA:      80/20: yes     OTHER:  PRIMARY: Medicare       Policy#:[id number]       Subscriber: Pt. Phone#: Verified online    Fax#:  Pre-Cert#:       Employer:  Benefits:  Phone #:      Name:  Eff. Date: Parts A and B effective 04/24/2002 Deduct: $1600      Out of Pocket Max:  None      Life Max: N/A  CIR: 100%      SNF: 100 days Outpatient: 80%     Co-Pay: 20% Home Health: 100%      Co-Pay: none DME: 80%     Co-Pay: 20% Providers: patient's choice Providers:  SECONDARY:       Policy#:      Phone#:    Development worker, community:       Phone#:    The Therapist, art Information Summary" for patients in Inpatient Rehabilitation Facilities with attached "Privacy Act [place] Records" was provided and verbally reviewed with: Family   Emergency Contact Information Contact Information       Name Relation Home Work Mobile    Curtis,[name] Significant other [phone]               Current Medical History  Patient Admitting Diagnosis: Wernicke's Encephalopathy, CVA  History of Present Illness: Jose [name] is a 86 y.o. male with medical history significant of alcohol use, GERD, thiamine deficiency, ataxia who presented to the [facility] ED with ongoing generalized pain.  With additional information from friend EDP was able to determine the patient had stopped drinking 4 to 5 days ago.  Also that he has years of issues with his balance and weakness however he has had significant increase in his confusion and weakness in the last 4 to 5 days (patient initially stated he did not know when he quit in the ED, on my exam he states that his girlfriend is making it up and that it was 4 to 5 weeks ago).Vital signs in the ED significant for heart rate in the  40s to 120s, blood pressure in the 623J to 628 systolic, respiratory rate in the 20s to 30s.  Episode of hypoxia documented however this has not been persistent.  Lab work-up included CMP with glucose 126, T. bili 1.3.  CBC with leukocytosis to 14.7.  Lipase normal.  Urinalysis pending.  Ethanol level negative.  Ammonia level negative.  Pending labs include B1, B12, folate, TSH.  CT head showed new focal hypodense lesion.  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  Neurology was consulted and recommended stroke work-up as well as administration of thiamine and further encephalopathy work-up.  Patient received thiamine and IV fluids in the ED. No signs of ETOH withdrawal this admission. Echocardiogram shows normal systolic function. EEG did not show any epileptiform activity. Pt. Also with swelling of the right elbow area around with some erythema and warmth noted.  X-ray was done which did not show any obvious fractures.    Plain films did not show any acute abnormalities.  CT of the elbow was subsequently done which showed joint effusion and soft  tissue swelling.  Uric acid level was normal at 4.5.  ESR and CRP was significantly high.  WBC was noted to be high as well.Orthopedics was subsequently consulted.  Patient underwent joint aspiration on 10/26.  Gram stain does not show any organisms.  He does have intracellular monosodium urate crystals.  Synovial fluid more consistent with acute gout and inflammatory arthritis rather than infectious arthritis.  Cultures without any growth so far. Started on doxycycline. Per ortho, no plan for surgery at this time PT, SLP,  and OT saw Pt. And recommended CIR to assist return to PLOF.      Complete NIHSS TOTAL: 0   Patient's medical [facility] from [facility]  has been reviewed by the rehabilitation admission coordinator and physician.   Past Medical History      Past Medical History:  Diagnosis Date   Acute meniscal tear of knee       RIGHT KNEE   Arthritis     Hives      PER PT UNKNOWN WHY   IBS (irritable bowel syndrome)     Mild sleep apnea      PER STUDY  11-30-2010--  NO CPAP   Spinal stenosis        Has the patient had major surgery during 100 days prior to admission? No   Family History   family history includes Heart disease in his father and mother.   Current Medications   Current Facility-Administered Medications:    acetaminophen (TYLENOL) tablet 650 mg, 650 mg, Oral, Q4H PRN, 650 mg at 11/17/21 0835 **OR** acetaminophen (TYLENOL) 160 MG/5ML solution 650 mg, 650 mg, Per Tube, Q4H PRN **OR** acetaminophen (TYLENOL) suppository 650 mg, 650 mg, Rectal, Q4H PRN, Marcelyn Bruins, MD   aspirin EC tablet 81 mg, 81 mg, Oral, Daily, Marcelyn Bruins, MD, 81 mg at 11/19/21 0958   atorvastatin (LIPITOR) tablet 40 mg, 40 mg, Oral, Daily, Sheikh, Omair Latif, DO, 40 mg at 11/19/21 4696   doxycycline (VIBRA-TABS) tablet 100 mg, 100 mg, Oral, Q12H, Bonnielee Haff, MD, 100 mg at 11/19/21 0958   enoxaparin (LOVENOX) injection 40 mg, 40 mg, Subcutaneous, Q24H, Marcelyn Bruins, MD, 40 mg at 11/18/21 2201   feeding supplement (ENSURE ENLIVE / ENSURE PLUS) liquid 237 mL, 237 mL, Oral, BID BM, Bonnielee Haff, MD, 237 mL at 11/17/21 1436   multivitamin with minerals tablet 1 tablet, 1 tablet, Oral, Daily, Bonnielee Haff, MD, 1 tablet at 11/19/21 0958   pantoprazole (PROTONIX) EC tablet 40 mg, 40 mg, Oral, Daily, Marcelyn Bruins, MD, 40 mg at 11/19/21 2952   potassium chloride SA (KLOR-CON M) CR tablet 40 mEq, 40 mEq, Oral, Once, Bonnielee Haff, MD   predniSONE (DELTASONE) tablet 20 mg, 20 mg, Oral, Q breakfast, Bonnielee Haff, MD, 20 mg at 11/19/21 0747   senna-docusate (Senokot-S) tablet 1 tablet, 1 tablet, Oral, QHS PRN, Marcelyn Bruins, MD   thiamine (VITAMIN B1) tablet 250 mg, 250 mg, Oral, Daily, 250 mg at 11/19/21 0957 **FOLLOWED BY** [START ON 11/23/2021] thiamine (VITAMIN B1) tablet 100 mg, 100  mg, Oral, Daily, Donnamae Jude, RPH   traMADol Veatrice Bourbon) tablet 50 mg, 50 mg, Oral, Q6H PRN, Bonnielee Haff, MD, 50 mg at 11/17/21 2241   Patients Current Diet:  Diet Order                  Diet Heart Room service appropriate? Yes; Fluid consistency: Thin  Diet effective now  Precautions / Restrictions Precautions Precautions: Fall Precaution Comments: permissive HTN, Rt shoulder and elbow pain Restrictions Weight Bearing Restrictions: No    Has the patient had 2 or more falls or a fall with injury in the past year? Yes   Prior Activity Level Community (5-7x/wk): Pt. active in the community PTA   Prior Functional Level Self Care: Did the patient need help bathing, dressing, using the toilet or eating? Independent   Indoor Mobility: Did the patient need assistance with walking from room to room (with or without device)? Independent   Stairs: Did the patient need assistance with internal or external stairs (with or without device)? Independent   Functional Cognition: Did the patient need help planning regular tasks such as shopping or remembering to take medications? Independent   Patient Information Are you of Hispanic, Latino/a,or Spanish origin?: A. No, not of Hispanic, Latino/a, or Spanish origin What is your race?: A. White Do you need or want an interpreter to communicate with a doctor or health care staff?: 0. No   Patient's Response To:  Health Literacy and Transportation Is the patient able to respond to health literacy and transportation needs?: No Health Literacy - How often do you need to have someone help you when you read instructions, pamphlets, or other written material from your doctor or pharmacy?: Sometimes In the past 12 months, has lack of transportation kept you from medical appointments or from getting medications?: No In the past 12 months, has lack of transportation kept you from meetings, work, or from getting things needed  for daily living?: No   Home Assistive Devices / [place] Devices/Equipment: Environmental consultant (specify type) Home Equipment: Adaptive equipment, Rolling Walker (2 wheels), Cane - single point   Prior Device Use: Indicate devices/aids used by the patient prior to current illness, exacerbation or injury? None of the above   Current Functional Level Cognition   Arousal/Alertness: Awake/alert Overall Cognitive Status: Impaired/Different from baseline Orientation Level: Oriented to person, Oriented to place Following Commands: Follows one step commands inconsistently, Follows one step commands with increased time Safety/Judgement: Decreased awareness of safety, Decreased awareness of deficits General Comments: Poor awareness, significantly increased time for sequencing. Difficulty with motor planning. Repetitively asking "what is it you want me to do". Awareness of urge to use restroom, unaware he was already actively going. Attention: Sustained Sustained Attention: Impaired Sustained Attention Impairment: Verbal basic, Functional basic Memory: Impaired Memory Impairment: Retrieval deficit, Decreased recall of new information, Decreased short term memory Decreased Short Term Memory: Verbal basic, Functional basic Awareness: Impaired Awareness Impairment: Intellectual impairment, Anticipatory impairment Problem Solving: Impaired Problem Solving Impairment: Verbal basic, Functional basic Executive Function: Organizing, Reasoning Reasoning: Impaired Reasoning Impairment: Verbal basic, Functional basic Organizing: Impaired Organizing Impairment: Verbal basic, Functional basic Behaviors: Perseveration, Verbal agitation Safety/Judgment: Impaired Comments: Pt pulled IV out of arm prior to SLP arrival    Extremity Assessment (includes Sensation/Coordination)   Upper Extremity Assessment: RUE deficits/detail RUE Deficits / Details: Going for MRI of R elbow today RUE Sensation: WNL RUE  Coordination: WNL (but no movement shoulder or elbow) LUE Deficits / Details: WFL LUE Sensation: WNL LUE Coordination: WNL  Lower Extremity Assessment: Defer to PT evaluation RLE Deficits / Details: grossly pt is at 3/5 on Rt LE with hip flexion, knee extension, and plantar flexion, 4-/5 for dorsiflexion RLE Sensation: WNL RLE Coordination: WNL LLE Deficits / Details: grossly pt is at 4-/5 on Rt LE with hip flexion, knee extension, and dorsiflexion, 3/5 for plantarflexion LLE Sensation:  WNL LLE Coordination: WNL     ADLs   Overall ADL's : Needs assistance/impaired Eating/Feeding: Bed level, Set up Grooming: Wash/dry face, Bed level, Set up Upper Body Bathing: Minimal assistance, Bed level Lower Body Bathing: +2 for physical assistance, Maximal assistance, Bed level Upper Body Dressing : Moderate assistance, Bed level Lower Body Dressing: Total assistance, Bed level Toilet Transfer: Moderate assistance, +2 for physical assistance, Rolling walker (2 wheels), BSC/3in1 Toilet Transfer Details (indicate cue type and reason): Initially Min A for rise from low bed surface, but ultimately mod A to coordinate SPT to [facility], and mod A to rise from [facility]. Required min guard sitting on toilet due to poor safety awareness and decreased trunk strength/control Toileting- Clothing Manipulation and Hygiene: Total assistance, Sit to/from stand Toileting - Clothing Manipulation Details (indicate cue type and reason): Total A for posterior pericare. Functional mobility during ADLs: Moderate assistance, +2 for physical assistance, Rolling walker (2 wheels)     Mobility   Overal bed mobility: Needs Assistance Bed Mobility: Supine to Sit, Sit to Supine Supine to sit: +2 for physical assistance, Mod assist, HOB elevated Sit to supine: Mod assist, HOB elevated Sit to sidelying: Mod assist, +2 for safety/equipment General bed mobility comments: up to sit EOB with assist for scooting hips and lifting trunk with much  increased time and cues; assist to supine for each leg onto bed and lowering shoulders with cues and time     Transfers   Overall transfer level: Needs assistance Equipment used: Rolling walker (2 wheels) Transfers: Sit to/from Stand, Bed to chair/wheelchair/BSC Sit to Stand: Min assist, Mod assist, +2 physical assistance Bed to/from chair/wheelchair/BSC transfer type:: Step pivot Step pivot transfers: Mod assist, +2 physical assistance, Max assist General transfer comment: assist up to standing with min A initially with elevated height of bed and cues for anterior weight shift ; step to [facility] with +2 mod A for walker management and proximity and cues for posture; stood from [facility] with some lifting assist to RW and stepping to bed wtih much increased time, mod cues and assist for RW, cues for posture, etc     Ambulation / Gait / Stairs / Wheelchair Mobility   Ambulation/Gait General Gait Details: unable     Posture / Balance Dynamic Sitting Balance Sitting balance - Comments: mod A for posterior bias on EOB, after on BSC leaning forward to have BM and minguard for safety due to poor safety awareness Balance Overall balance assessment: Needs assistance Sitting-balance support: Feet supported Sitting balance-Leahy Scale: Poor Sitting balance - Comments: mod A for posterior bias on EOB, after on BSC leaning forward to have BM and minguard for safety due to poor safety awareness Postural control: Posterior lean Standing balance support: Bilateral upper extremity supported Standing balance-Leahy Scale: Poor Standing balance comment: +2 for safety static standing with RW posterior bias initially     Special needs/care consideration Skin intact    Previous Home Environment (from acute therapy documentation) Living Arrangements: Alone  Lives With: Other (Comment) (potentially girlfriend; unsure of accuracy of information given by pt) Available Help at Discharge: Available PRN/intermittently Type  of Home: House Home Layout: Two level Alternate Level Stairs-Number of Steps: Laundry and computer in basement. Pt likes to access but necessary. Home Access: Stairs to enter Entrance Stairs-Number of Steps: 1 (threshold) Bathroom Shower/Tub: Tub/shower unit (Also with walk in shower but pt prefers tub) Biochemist, clinical: [place]: No Additional Comments: pt typically very active and independent. he workouts at  the gym ~5x/week and   Discharge Living Setting Plans for Discharge Living Setting: Patient's home Type of Home at Discharge: Other (Comment) Discharge Home Layout: Able to live on main level with bedroom/bathroom Discharge Home Access: Elevator Discharge Bathroom Shower/Tub: Tub/shower unit Discharge Bathroom Toilet: Standard Discharge Bathroom Accessibility: Yes How Accessible: Accessible via walker, Accessible via wheelchair Does the patient have any problems obtaining your medications?: No   Social/Family/Support Systems Patient Roles: Partner Contact Information: [phone] Anticipated Caregiver: Nelda Bucks Anticipated Caregiver's Contact Information: [phone] Ability/Limitations of Caregiver: can provide min A (Family can hire private [place] when [employer]vers[employer] is gone) Careers adviser: Evenings only Discharge Plan Discussed with Primary Caregiver: Yes Is Caregiver In Agreement with Plan?: No Does Caregiver/Family have Issues with Lodging/Transportation while Pt is in Rehab?: No   Goals Patient/Family Goal for Rehab: PT/OT/SLP Min A Expected length of stay: 14-16 days Pt/Family Agrees to Admission and willing to participate: Yes Program Orientation Provided & Reviewed with Pt/Caregiver Including Roles  & Responsibilities: Yes   Decrease burden of Care through IP rehab admission: none   Possible need for SNF placement upon discharge: none    Patient Condition: I have reviewed medical records from [facility], spoken with  CM, and patient and son. I met with patient at the bedside for inpatient rehabilitation assessment.  Patient will benefit from ongoing PT, OT, and SLP, can actively participate in 3 hours of therapy a day 5 days of the week, and can make measurable gains during the admission.  Patient will also benefit from the coordinated team approach during an Inpatient Acute Rehabilitation admission.  The patient will receive intensive therapy as well as Rehabilitation physician, nursing, social worker, and care management interventions.  Due to safety, skin/wound care, disease management, medication administration, pain management, and patient education the patient requires 24 hour a day rehabilitation nursing.  The patient is currently min+2-mod+2  with mobility and basic ADLs.  Discharge setting and therapy post discharge at home with home health is anticipated.  Patient has agreed to participate in the Acute Inpatient Rehabilitation Program and will admit today.   Preadmission Screen Completed By:  Genella Mech, 11/19/2021 10:48 AM ______________________________________________________________________   Discussed status with Dr. Letta Pate  on 11/20/21 at 51 and received approval for admission today.   Admission Coordinator:  Genella Mech, CCC-SLP, time 932/Date 11/20/21    Assessment/Plan: Diagnosis:  Right corona radiata infarct  Does the need for close, 24 hr/day Medical supervision in concert with the patient's rehab needs make it unreasonable for this patient to be served in a less intensive setting? Yes Co-Morbidities requiring supervision/potential complications: thiamine deficiency  Due to bladder management, bowel management, safety, skin/wound care, disease management, medication administration, pain management, and patient education, does the patient require 24 hr/day rehab nursing? Yes Does the patient require coordinated care of a physician, rehab nurse, PT, OT, and SLP to address physical and  functional deficits in the context of the above medical diagnosis(es)? Yes Addressing deficits in the following areas: balance, endurance, locomotion, strength, transferring, bowel/bladder control, bathing, dressing, feeding, toileting, cognition, and psychosocial support Can the patient actively participate in an intensive therapy program of at least 3 hrs of therapy 5 days a week? Yes The potential for patient to make measurable gains while on inpatient rehab is good Anticipated functional outcomes upon discharge from inpatient rehab: supervision PT, supervision OT, supervision SLP Estimated rehab length of stay to reach the above functional goals is: 14-16d Anticipated discharge destination:  Home 10. Overall Rehab/Functional Prognosis: good     MD Signature: Charlett Blake M.D. [employer] Fellow Am Acad of Phys Med and Rehab Diplomate Am Board of Electrodiagnostic Med Fellow Am Board of Interventional Pain

## 2021-11-20 NOTE — Plan of Care (Signed)

## 2021-11-20 NOTE — H&P (Addendum)
Physical Medicine and Rehabilitation Admission H&P    CC:  Feels like ADHD on steroids : HPI: 86 y.o. male with medical history significant of alcohol use, GERD, thiamine deficiency, ataxia who presented to the [facility] ED with ongoing generalized pain.  With additional information from friend EDP was able to determine the patient had stopped drinking 4 to 5 days ago.  Also that he has years of issues with his balance and weakness however he has had significant increase in his confusion and weakness in the last 4 to 5 days (patient initially stated he did not know when he quit in the ED, on my exam he states that his girlfriend is making it up and that it was 4 to 5 weeks ago).Vital signs in the ED significant for heart rate in the 40s to 120s, blood pressure in the 998P to 382 systolic, respiratory rate in the 20s to 30s.  Episode of hypoxia documented however this has not been persistent.  Lab work-up included CMP with glucose 126, T. bili 1.3.  CBC with leukocytosis to 14.7.  Lipase normal.  Urinalysis pending.  Ethanol level negative.  Ammonia level negative.  Pending labs include B1, B12, folate, TSH.  CT head showed new focal hypodense lesion.  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  Neurology was consulted and recommended stroke work-up as well as administration of thiamine and further encephalopathy work-up.  Patient received thiamine and IV fluids in the ED. No signs of ETOH withdrawal this admission. Echocardiogram shows normal systolic function. EEG did not show any epileptiform activity. Pt. Also with swelling of the right elbow area around with some erythema and warmth noted.  X-ray was done which did not show any obvious fractures.    Plain films did not show any acute abnormalities.  CT of the elbow was subsequently done which showed joint effusion and soft tissue swelling.  Uric acid level was normal at 4.5.  ESR and CRP was significantly high.  WBC was noted to be  high as well.Orthopedics was subsequently consulted.  Patient underwent joint aspiration on 10/26.  Gram stain does not show any organisms.  He does have intracellular monosodium urate crystals.  Synovial fluid more consistent with acute gout and inflammatory arthritis rather than infectious arthritis.  Cultures without any growth so far. Started on doxycycline. Per ortho, no plan for surgery at this time PT, SLP,  and OT saw Pt. And recommended CIR to assist return to PLOF.      Review of Systems  Constitutional:  Negative for chills and fever.  HENT:  Negative for ear discharge and ear pain.   Eyes:  Negative for double vision, discharge and redness.  Respiratory:  Negative for shortness of breath, wheezing and stridor.   Cardiovascular:  Negative for chest pain and palpitations.  Gastrointestinal:  Negative for heartburn, nausea and vomiting.  Genitourinary:  Negative for flank pain and hematuria.  Musculoskeletal:  Positive for joint pain. Negative for back pain.  Skin:  Negative for rash.  Neurological:  Negative for sensory change and headaches.  Endo/Heme/Allergies:  Does not bruise/bleed easily.  Psychiatric/Behavioral:  Positive for memory loss.    Past Medical History:  Diagnosis Date   Acute meniscal tear of knee    RIGHT KNEE   Arthritis    Hives    PER PT UNKNOWN WHY   IBS (irritable bowel syndrome)    Mild sleep apnea    PER STUDY  11-30-2010--  NO CPAP   Neuromuscular disorder ([place])    Spinal stenosis    Past Surgical History:  Procedure Laterality Date   APPENDECTOMY     KNEE ARTHROSCOPY WITH LATERAL MENISECTOMY Right 04/12/2012   Procedure: RIGHT KNEE ARTHROSCOPY WITH PARTIAL MEDIAL AND LATERAL MENISECTOMY;  Surgeon: Magnus Sinning, MD;  Location: [facility];  Service: Orthopedics;  Laterality: Right;   SHOULDER ARTHROSCOPY WITH ROTATOR CUFF REPAIR AND SUBACROMIAL DECOMPRESSION  12-21-1999  &  01-28-2004   Family History  Problem Relation Age  of Onset   Heart disease Mother    Heart disease Father    Social History:  reports that he has quit smoking. His smoking use included cigarettes. He has never used smokeless tobacco. He reports current alcohol use. He reports that he does not use drugs. Allergies: No Known Allergies Medications Prior to Admission  Medication Sig Dispense Refill   ALPRAZolam (XANAX) 0.5 MG tablet Take 0.5 mg by mouth at bedtime as needed for sleep. Taken 1.5 daily     baclofen (LIORESAL) 10 MG tablet Take 1-2 tablets (10-20 mg total) by mouth 2 (two) times daily. 120 each 0   celecoxib (CELEBREX) 200 MG capsule Take 200 mg by mouth daily.      methylphenidate (RITALIN) 5 MG tablet Take 5 mg by mouth daily as needed for anxiety. Takes 5-6  per day     omeprazole (PRILOSEC) 20 MG capsule Take 20 mg by mouth every morning.      oxybutynin (DITROPAN XL) 10 MG 24 hr tablet Take 1 tablet (10 mg total) by mouth at bedtime. 90 tablet 1   vitamin C (ASCORBIC ACID) 500 MG tablet Take 500 mg by mouth daily.        Home:     Functional History:    Functional Status:  Mobility:          ADL:    Cognition:      Physical Exam: There were no vitals taken for this visit. Physical Exam Vitals and nursing note reviewed.  Constitutional:      Appearance: He is normal weight.  HENT:     Head: Normocephalic and atraumatic.  Eyes:     Extraocular Movements: Extraocular movements intact.     Conjunctiva/sclera: Conjunctivae normal.     Pupils: Pupils are equal, round, and reactive to light.  Cardiovascular:     Rate and Rhythm: Regular rhythm.     Pulses: Normal pulses.     Heart sounds: Normal heart sounds. No murmur heard. Pulmonary:     Effort: No respiratory distress.     Breath sounds: Normal breath sounds. No stridor. No wheezing or rhonchi.  Abdominal:     General: Abdomen is flat. Bowel sounds are normal. There is no distension.     Palpations: Abdomen is soft.  Musculoskeletal:         General: Tenderness present. No swelling.     Comments: RIght elbow pain with ROM, limited endrange ext, no erythema  Skin:    General: Skin is warm and dry.  Neurological:     Mental Status: He is alert.     Cranial Nerves: No dysarthria or facial asymmetry.     Motor: No tremor or abnormal muscle tone.     Coordination: Coordination abnormal.     Gait: Gait abnormal.     Comments: Motor strength is 4/5 bilateral deltoid 3/5 right tricep 4/5 left tricep 4/5 bilateral grip 4/5 bilateral hip flexor knee extensor ankle dorsiflexor  Patient reports equal sensation bilateral hands and feet.     Results for orders placed or performed during the hospital encounter of 11/14/21 (from the past 48 hour(s))  CBC     Status: Abnormal   Collection Time: 11/19/21  2:46 AM  Result Value Ref Range   WBC 7.2 4.0 - 10.5 K/uL   RBC 4.05 (L) 4.22 - 5.81 MIL/uL   Hemoglobin 12.5 (L) 13.0 - 17.0 g/dL   HCT 35.7 (L) 39.0 - 52.0 %   MCV 88.1 80.0 - 100.0 fL   MCH 30.9 26.0 - 34.0 pg   MCHC 35.0 30.0 - 36.0 g/dL   RDW 12.5 11.5 - 15.5 %   Platelets 357 150 - 400 K/uL   nRBC 0.0 0.0 - 0.2 %    Comment: Performed at [facility], [place] [address] [place], [place] [address]  Basic metabolic panel     Status: Abnormal   Collection Time: 11/19/21  2:46 AM  Result Value Ref Range   Sodium 137 135 - 145 mmol/L   Potassium 3.6 3.5 - 5.1 mmol/L   Chloride 102 98 - 111 mmol/L   CO2 24 22 - 32 mmol/L   Glucose, Bld 115 (H) 70 - 99 mg/dL    Comment: Glucose reference range applies only to samples taken after fasting for at least 8 hours.   BUN 14 8 - 23 mg/dL   Creatinine, Ser 0.81 0.61 - 1.24 mg/dL   Calcium 9.7 8.9 - 10.3 mg/dL   GFR, Estimated >60 >60 mL/min    Comment: (NOTE) Calculated using the CKD-EPI Creatinine Equation (2021)    Anion gap 11 5 - 15    Comment: Performed at [facility] [address] [place], [place] [address]   No results found.    There were no vitals taken for  this visit.  Medical Problem List and Plan: 1. 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  Antithrombotics: -DVT/anticoagulation:  Pharmaceutical: Lovenox    -antiplatelet therapy: ASA 40m 3. Pain Management: Tylenol for mild pain tramadol for moderate to severe pain 4. Mood/Behavior/Sleep: Melatonin 381m  -antipsychotic agents: N/A 5. Neuropsych/cognition: This patient is not capable of making decisions on his own behalf. 6. Skin/Wound Care: monitor  order eucerin prn 7. Fluids/Electrolytes/Nutrition: check CMET in am, monitor I/Os, supplement with MVI and VIt C 9.  Thiamine deficiency cont B1 25060md through 10/31, start 100m61mr day on 11/23/21, pt states he was treated for this a couple years ago as well 10.  GERD- may be related to ETOH use, cont protonix 40mg53m day 11. GOut RIght elbow cont prednisone 20mg 80mday x 5-7d depending on resolution 12.  Overactive bladder  Ditropan XL 10mg q30mmonitor for retention   Kaleel Schmieder Charlett Blake/29/2023

## 2021-11-20 NOTE — Progress Notes (Signed)
Inpatient Rehab Admissions Coordinator:    I have a CIR bed and can admit this Pt. Today. Rn may call report to [phone].  Clemens Catholic, [place], [place] Admissions Coordinator  [phone] ([place]) [phone] (office)

## 2021-11-20 NOTE — Plan of Care (Signed)
Orthopedic Plan of Care  No growth to date for both aerobic and anaerobic cultures on the aspirate of the right elbow. No operative plan at this time. Can continue to treat for gout. Will follow the cultures as a positive culture would change management and would recommend operative irrigation and debridement. Patient can be weight bearing as tolerated on the right upper extremity.   Callie Fielding, MD Orthopedic Surgeon

## 2021-11-21 DIAGNOSIS — I639 Cerebral infarction, unspecified: Secondary | ICD-10-CM | POA: Diagnosis not present

## 2021-11-21 DIAGNOSIS — L8990 Pressure ulcer of unspecified site, unspecified stage: Secondary | ICD-10-CM | POA: Insufficient documentation

## 2021-11-21 LAB — BODY FLUID CULTURE W GRAM STAIN: Culture: NO GROWTH

## 2021-11-21 MED ORDER — ENOXAPARIN SODIUM 40 MG/0.4ML IJ SOSY
40.0000 mg | PREFILLED_SYRINGE | INTRAMUSCULAR | Status: DC
  Administered 2021-11-21 – 2021-11-27 (×7): 40 mg via SUBCUTANEOUS
  Filled 2021-11-21 (×7): qty 0.4

## 2021-11-21 MED ORDER — THIAMINE MONONITRATE 100 MG PO TABS
100.0000 mg | ORAL_TABLET | Freq: Every day | ORAL | Status: DC
  Administered 2021-11-23 – 2021-11-28 (×6): 100 mg via ORAL
  Filled 2021-11-21 (×6): qty 1

## 2021-11-21 MED ORDER — ORAL CARE MOUTH RINSE: 15.0000 mL | OROMUCOSAL | Status: DC | PRN

## 2021-11-21 MED ORDER — TRAZODONE HCL 50 MG PO TABS
50.0000 mg | ORAL_TABLET | Freq: Every day | ORAL | Status: DC
  Administered 2021-11-21 – 2021-11-22 (×2): 50 mg via ORAL
  Filled 2021-11-21 (×2): qty 1

## 2021-11-21 MED ORDER — THIAMINE MONONITRATE 100 MG PO TABS
250.0000 mg | ORAL_TABLET | Freq: Every day | ORAL | Status: AC
  Administered 2021-11-21 – 2021-11-22 (×2): 250 mg via ORAL
  Filled 2021-11-21 (×2): qty 3

## 2021-11-21 NOTE — Discharge Instructions (Addendum)
Inpatient Rehab Discharge Instructions  Jose [name] Discharge date and time:  11/28/2021  Activities/Precautions/ Functional Status: Activity: no lifting, driving, or strenuous exercise until cleared by MD Diet: cardiac diet Wound Care: none needed Functional status:  ___ No restrictions     ___ Walk up steps independently ___ 24/7 supervision/assistance   ___ Walk up steps with assistance __x_ Intermittent supervision/assistance  ___ Bathe/dress independently ___ Walk with walker     ___ Bathe/dress with assistance ___ Walk Independently    ___ Shower independently ___ Walk with assistance    __x_ Shower with assistance _x__ No alcohol     ___ Return to work/school ________   Special Instructions: No driving, alcohol consumption or tobacco use.    COMMUNITY REFERRALS UPON DISCHARGE:    Home Health:   PT  OT  SP                 Agency:[facility] Phone: [phone]     Medical Equipment/Items Ordered: HAS ROLLING WALKER AND TUB SEAT-[name]-SIGNIFICANT OTHER WILL GET ON LINE                                                 Agency/Supplier:NA   STROKE/TIA DISCHARGE INSTRUCTIONS SMOKING Cigarette smoking nearly doubles your risk of having a stroke & is the single most alterable risk factor  If you smoke or have smoked in the last 12 months, you are advised to quit smoking for your health. Most of the excess cardiovascular risk related to smoking disappears within a year of stopping. Ask you doctor about anti-smoking medications [place] Quit Line: 1-800-QUIT NOW Free Smoking Cessation Classes [phone]  CHOLESTEROL Know your levels; limit fat & cholesterol in your diet  Lipid Panel     Component Value Date/Time   CHOL 126 11/15/2021 0522   TRIG 60 11/15/2021 0522   HDL 43 11/15/2021 0522   CHOLHDL 2.9 11/15/2021 0522   VLDL 12 11/15/2021 0522   LDLCALC 71 11/15/2021 0522     Many patients benefit from treatment even if their cholesterol is at  goal. Goal: Total Cholesterol (CHOL) less than 160 Goal:  Triglycerides (TRIG) less than 150 Goal:  HDL greater than 40 Goal:  LDL (LDLCALC) less than 100   BLOOD PRESSURE American Stroke Association blood pressure target is less that 120/80 mm/Hg  Your discharge blood pressure is:  BP: 119/68 Monitor your blood pressure Limit your salt and alcohol intake Many individuals will require more than one medication for high blood pressure  DIABETES (A1c is a blood sugar average for last 3 months) Goal HGBA1c is under 7% (HBGA1c is blood sugar average for last 3 months)  Diabetes: No known diagnosis of diabetes    Lab Results  Component Value Date   HGBA1C 5.3 11/15/2021    Your HGBA1c can be lowered with medications, healthy diet, and exercise. Check your blood sugar as directed by your physician Call your physician if you experience unexplained or low blood sugars.  PHYSICAL ACTIVITY/REHABILITATION Goal is 30 minutes at least 4 days per week  Activity: Increase activity slowly, Therapies: Physical Therapy: Home Health, Occupational Therapy: Home Health, and Speech Therapy: Home Health Return to work: n/a Activity decreases your risk of heart attack and stroke and makes your heart stronger.  It helps control your weight and blood pressure; helps you relax  and can improve your mood. Participate in a regular exercise program. Talk with your doctor about the best form of exercise for you (dancing, walking, swimming, cycling).  DIET/WEIGHT Goal is to maintain a healthy weight  Your discharge diet is:  Diet Order             Diet heart healthy/carb modified Room service appropriate? Yes; Fluid consistency: Thin  Diet effective now                  thin liquids Your height is:  Height: '5\' 4"'$  (162.6 cm) Your current weight is: Weight: 68.3 kg Your Body Mass Index (BMI) is:  BMI (Calculated): 25.83 Following the type of diet specifically designed for you will help prevent another  stroke. Your goal weight range is:   Your goal Body Mass Index (BMI) is 19-24. Healthy food habits can help reduce 3 risk factors for stroke:  High cholesterol, hypertension, and excess weight.  RESOURCES Stroke/Support Group:  Call [phone]   STROKE EDUCATION PROVIDED/REVIEWED AND GIVEN TO PATIENT Stroke warning signs and symptoms How to activate emergency medical system (call 911). Medications prescribed at discharge. Need for follow-up after discharge. Personal risk factors for stroke. Pneumonia vaccine given: No Flu vaccine given: No My questions have been answered, the writing is legible, and I understand these instructions.  I will adhere to these goals & educational materials that have been provided to me after my discharge from the hospital.      My questions have been answered and I understand these instructions. I will adhere to these goals and the provided educational materials after my discharge from the hospital.  Patient/Caregiver Signature _______________________________ Date __________  Clinician Signature _______________________________________ Date __________  Please bring this form and your medication list with you to all your follow-up doctor's appointments.

## 2021-11-21 NOTE — H&P (Signed)
Expand All Collapse All      Physical Medicine and Rehabilitation Admission H&P     CC:  Feels like ADHD on steroids : HPI: 86 y.o. male with medical history significant of alcohol use, GERD, thiamine deficiency, ataxia who presented to the [facility] ED with ongoing generalized pain.  With additional information from friend EDP was able to determine the patient had stopped drinking 4 to 5 days ago.  Also that he has years of issues with his balance and weakness however he has had significant increase in his confusion and weakness in the last 4 to 5 days (patient initially stated he did not know when he quit in the ED, on my exam he states that his girlfriend is making it up and that it was 4 to 5 weeks ago).Vital signs in the ED significant for heart rate in the 40s to 120s, blood pressure in the 484F to 207 systolic, respiratory rate in the 20s to 30s.  Episode of hypoxia documented however this has not been persistent.  Lab work-up included CMP with glucose 126, T. bili 1.3.  CBC with leukocytosis to 14.7.  Lipase normal.  Urinalysis pending.  Ethanol level negative.  Ammonia level negative.  Pending labs include B1, B12, folate, TSH.  CT head showed new focal hypodense lesion.  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  Neurology was consulted and recommended stroke work-up as well as administration of thiamine and further encephalopathy work-up.  Patient received thiamine and IV fluids in the ED. No signs of ETOH withdrawal this admission. Echocardiogram shows normal systolic function. EEG did not show any epileptiform activity. Pt. Also with swelling of the right elbow area around with some erythema and warmth noted.  X-ray was done which did not show any obvious fractures.    Plain films did not show any acute abnormalities.  CT of the elbow was subsequently done which showed joint effusion and soft tissue swelling.  Uric acid level was normal at 4.5.  ESR and CRP was significantly  high.  WBC was noted to be high as well.Orthopedics was subsequently consulted.  Patient underwent joint aspiration on 10/26.  Gram stain does not show any organisms.  He does have intracellular monosodium urate crystals.  Synovial fluid more consistent with acute gout and inflammatory arthritis rather than infectious arthritis.  Cultures without any growth so far. Started on doxycycline. Per ortho, no plan for surgery at this time PT, SLP,  and OT saw Pt. And recommended CIR to assist return to PLOF.        Review of Systems  Constitutional:  Negative for chills and fever.  HENT:  Negative for ear discharge and ear pain.   Eyes:  Negative for double vision, discharge and redness.  Respiratory:  Negative for shortness of breath, wheezing and stridor.   Cardiovascular:  Negative for chest pain and palpitations.  Gastrointestinal:  Negative for heartburn, nausea and vomiting.  Genitourinary:  Negative for flank pain and hematuria.  Musculoskeletal:  Positive for joint pain. Negative for back pain.  Skin:  Negative for rash.  Neurological:  Negative for sensory change and headaches.  Endo/Heme/Allergies:  Does not bruise/bleed easily.  Psychiatric/Behavioral:  Positive for memory loss.         Past Medical History:  Diagnosis Date   Acute meniscal tear of knee      RIGHT KNEE   Arthritis     Hives      PER PT UNKNOWN WHY  IBS (irritable bowel syndrome)     Mild sleep apnea      PER STUDY  11-30-2010--  NO CPAP   Neuromuscular disorder ([place])     Spinal stenosis           Past Surgical History:  Procedure Laterality Date   APPENDECTOMY       KNEE ARTHROSCOPY WITH LATERAL MENISECTOMY Right 04/12/2012    Procedure: RIGHT KNEE ARTHROSCOPY WITH PARTIAL MEDIAL AND LATERAL MENISECTOMY;  Surgeon: Magnus Sinning, MD;  Location: [facility];  Service: Orthopedics;  Laterality: Right;   SHOULDER ARTHROSCOPY WITH ROTATOR CUFF REPAIR AND SUBACROMIAL DECOMPRESSION   12-21-1999   &  01-28-2004         Family History  Problem Relation Age of Onset   Heart disease Mother     Heart disease Father      Social History:  reports that he has quit smoking. His smoking use included cigarettes. He has never used smokeless tobacco. He reports current alcohol use. He reports that he does not use drugs. Allergies: No Known Allergies       Medications Prior to Admission  Medication Sig Dispense Refill   ALPRAZolam (XANAX) 0.5 MG tablet Take 0.5 mg by mouth at bedtime as needed for sleep. Taken 1.5 daily       baclofen (LIORESAL) 10 MG tablet Take 1-2 tablets (10-20 mg total) by mouth 2 (two) times daily. 120 each 0   celecoxib (CELEBREX) 200 MG capsule Take 200 mg by mouth daily.        methylphenidate (RITALIN) 5 MG tablet Take 5 mg by mouth daily as needed for anxiety. Takes 5-6  per day       omeprazole (PRILOSEC) 20 MG capsule Take 20 mg by mouth every morning.        oxybutynin (DITROPAN XL) 10 MG 24 hr tablet Take 1 tablet (10 mg total) by mouth at bedtime. 90 tablet 1   vitamin C (ASCORBIC ACID) 500 MG tablet Take 500 mg by mouth daily.              Home:   Functional History:   Functional Status:  Mobility:   ADL:   Cognition:   Physical Exam: There were no vitals taken for this visit. Physical Exam Vitals and nursing note reviewed.  Constitutional:      Appearance: He is normal weight.  HENT:     Head: Normocephalic and atraumatic.  Eyes:     Extraocular Movements: Extraocular movements intact.     Conjunctiva/sclera: Conjunctivae normal.     Pupils: Pupils are equal, round, and reactive to light.  Cardiovascular:     Rate and Rhythm: Regular rhythm.     Pulses: Normal pulses.     Heart sounds: Normal heart sounds. No murmur heard. Pulmonary:     Effort: No respiratory distress.     Breath sounds: Normal breath sounds. No stridor. No wheezing or rhonchi.  Abdominal:     General: Abdomen is flat. Bowel sounds are normal. There is no  distension.     Palpations: Abdomen is soft.  Musculoskeletal:        General: Tenderness present. No swelling.     Comments: RIght elbow pain with ROM, limited endrange ext, no erythema  Skin:    General: Skin is warm and dry.  Neurological:     Mental Status: He is alert.     Cranial Nerves: No dysarthria or facial asymmetry.     Motor:  No tremor or abnormal muscle tone.     Coordination: Coordination abnormal.     Gait: Gait abnormal.     Comments: Motor strength is 4/5 bilateral deltoid 3/5 right tricep 4/5 left tricep 4/5 bilateral grip 4/5 bilateral hip flexor knee extensor ankle dorsiflexor Patient reports equal sensation bilateral hands and feet.        Lab Results Last 48 Hours        Results for orders placed or performed during the hospital encounter of 11/14/21 (from the past 48 hour(s))  CBC     Status: Abnormal    Collection Time: 11/19/21  2:46 AM  Result Value Ref Range    WBC 7.2 4.0 - 10.5 K/uL    RBC 4.05 (L) 4.22 - 5.81 MIL/uL    Hemoglobin 12.5 (L) 13.0 - 17.0 g/dL    HCT 35.7 (L) 39.0 - 52.0 %    MCV 88.1 80.0 - 100.0 fL    MCH 30.9 26.0 - 34.0 pg    MCHC 35.0 30.0 - 36.0 g/dL    RDW 12.5 11.5 - 15.5 %    Platelets 357 150 - 400 K/uL    nRBC 0.0 0.0 - 0.2 %      Comment: Performed at [facility], [place] [address] [place], [place] [address]  Basic metabolic panel     Status: Abnormal    Collection Time: 11/19/21  2:46 AM  Result Value Ref Range    Sodium 137 135 - 145 mmol/L    Potassium 3.6 3.5 - 5.1 mmol/L    Chloride 102 98 - 111 mmol/L    CO2 24 22 - 32 mmol/L    Glucose, Bld 115 (H) 70 - 99 mg/dL      Comment: Glucose reference range applies only to samples taken after fasting for at least 8 hours.    BUN 14 8 - 23 mg/dL    Creatinine, Ser 0.81 0.61 - 1.24 mg/dL    Calcium 9.7 8.9 - 10.3 mg/dL    GFR, Estimated >60 >60 mL/min      Comment: (NOTE) Calculated using the CKD-EPI Creatinine Equation (2021)      Anion gap 11 5 - 15       Comment: Performed at [facility] [address] [place], [place] [address]      Imaging Results (Last 48 hours)  No results found.         There were no vitals taken for this visit.   Medical Problem List and Plan: 1. 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  Antithrombotics: -DVT/anticoagulation:  Pharmaceutical: Lovenox               -antiplatelet therapy: ASA 8m 3. Pain Management: Tylenol for mild pain tramadol for moderate to severe pain 4. Mood/Behavior/Sleep: Melatonin 329m             -antipsychotic agents: N/A 5. Neuropsych/cognition: This patient is not capable of making decisions on his own behalf. 6. Skin/Wound Care: monitor  order eucerin prn 7. Fluids/Electrolytes/Nutrition: check CMET in am, monitor I/Os, supplement with MVI and VIt C 9.  Thiamine deficiency cont B1 25037md through 10/31, start 100m36mr day on 11/23/21, pt states he was treated for this a couple  years ago as well 10.  GERD- may be related to ETOH use, cont protonix 47m per day 11. GOut RIght elbow cont prednisone 210mper day x 5-7d depending on resolution 12.  Overactive bladder  Ditropan XL 1023mhs, monitor for retention    AndCharlett BlakeD 11/20/2021

## 2021-11-21 NOTE — Progress Notes (Signed)
Inpatient [facility] Individual Statement of Services  Patient Name:  Jose [name]  Date:  11/21/2021  Welcome to the [facility].  Our goal is to provide you with an individualized program based on your diagnosis and situation, designed to meet your specific needs.  With this comprehensive rehabilitation program, you will be expected to participate in at least 3 hours of rehabilitation therapies Monday-Friday, with modified therapy programming on the weekends.  Your rehabilitation program will include the following services:  Physical Therapy (PT), Occupational Therapy (OT), Speech Therapy (ST), 24 hour per day rehabilitation nursing, Therapeutic Recreaction (TR), Neuropsychology, Care Coordinator, Rehabilitation Medicine, Nutrition Services, and Pharmacy Services  Weekly team conferences will be held on Tuesday to discuss your progress.  Your Inpatient Rehabilitation Care Coordinator will talk with you frequently to get your input and to update you on team discussions.  Team conferences with you and your family in attendance may also be held.  Expected length of stay: 2-3 weeks  Overall anticipated outcome: supervision level with cues  Depending on your progress and recovery, your program may change. Your Inpatient Rehabilitation Care Coordinator will coordinate services and will keep you informed of any changes. Your Inpatient Rehabilitation Care Coordinator's name and contact numbers are listed  below.  The following services may also be recommended but are not provided by the [facility] will be made to provide these services after discharge if needed.  Arrangements include referral to agencies that provide these services.  Your insurance has been verified to be:  Medicare Your primary doctor is:  Pricilla Holm  Pertinent information will be shared with your doctor and your insurance company.  Inpatient Rehabilitation Care Coordinator:  Ovidio Kin, [place] or Emilia Beck  Information discussed with and copy given to patient by: [name], 11/21/2021, 12:29 PM

## 2021-11-21 NOTE — Progress Notes (Signed)
PMR Admission Coordinator Pre-Admission Assessment   Patient: Jose [name] is an 86 y.o., male MRN: [id number] DOB: [birth year] Height: 5' 4" (162.6 cm) Weight: 71.2 kg   Insurance Information HMO:     PPO:      PCP:      IPA:      80/20: yes     OTHER:  PRIMARY: Medicare       Policy#:[id number]       Subscriber: Pt. Phone#: Verified online    Fax#:  Pre-Cert#:       Employer:  Benefits:  Phone #:      Name:  Eff. Date: Parts A and B effective 04/24/2002 Deduct: $1600      Out of Pocket Max:  None      Life Max: N/A  CIR: 100%      SNF: 100 days Outpatient: 80%     Co-Pay: 20% Home Health: 100%      Co-Pay: none DME: 80%     Co-Pay: 20% Providers: patient's choice Providers:  SECONDARY:       Policy#:      Phone#:    Development worker, community:       Phone#:    The Therapist, art Information Summary" for patients in Inpatient Rehabilitation Facilities with attached "Privacy Act [place] Records" was provided and verbally reviewed with: Family   Emergency Contact Information Contact Information       Name Relation Home Work Mobile    Curtis,[name] Significant other [phone]               Current Medical History  Patient Admitting Diagnosis: Wernicke's Encephalopathy, CVA  History of Present Illness: Jose [name] is a 86 y.o. male with medical history significant of alcohol use, GERD, thiamine deficiency, ataxia who presented to the [facility] ED with ongoing generalized pain.  With additional information from friend EDP was able to determine the patient had stopped drinking 4 to 5 days ago.  Also that he has years of issues with his balance and weakness however he has had significant increase in his confusion and weakness in the last 4 to 5 days (patient initially stated he did not know when he quit in the ED, on my exam he states that his girlfriend is making it up and that it was 4 to 5 weeks ago).Vital signs in the ED significant for heart rate in  the 40s to 120s, blood pressure in the 226J to 335 systolic, respiratory rate in the 20s to 30s.  Episode of hypoxia documented however this has not been persistent.  Lab work-up included CMP with glucose 126, T. bili 1.3.  CBC with leukocytosis to 14.7.  Lipase normal.  Urinalysis pending.  Ethanol level negative.  Ammonia level negative.  Pending labs include B1, B12, folate, TSH.  CT head showed new focal hypodense lesion.  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  Neurology was consulted and recommended stroke work-up as well as administration of thiamine and further encephalopathy work-up.  Patient received thiamine and IV fluids in the ED. No signs of ETOH withdrawal this admission. Echocardiogram shows normal systolic function. EEG did not show any epileptiform activity. Pt. Also with swelling of the right elbow area around with some erythema and warmth noted.  X-ray was done which did not show any obvious fractures.    Plain films did not show any acute abnormalities.  CT of the elbow was subsequently done which showed joint effusion and  soft tissue swelling.  Uric acid level was normal at 4.5.  ESR and CRP was significantly high.  WBC was noted to be high as well.Orthopedics was subsequently consulted.  Patient underwent joint aspiration on 10/26.  Gram stain does not show any organisms.  He does have intracellular monosodium urate crystals.  Synovial fluid more consistent with acute gout and inflammatory arthritis rather than infectious arthritis.  Cultures without any growth so far. Started on doxycycline. Per ortho, no plan for surgery at this time PT, SLP,  and OT saw Pt. And recommended CIR to assist return to PLOF.      Complete NIHSS TOTAL: 0   Patient's medical record from [facility]  has been reviewed by the rehabilitation admission coordinator and physician.   Past Medical History      Past Medical History:  Diagnosis Date   Acute meniscal tear of  knee      RIGHT KNEE   Arthritis     Hives      PER PT UNKNOWN WHY   IBS (irritable bowel syndrome)     Mild sleep apnea      PER STUDY  11-30-2010--  NO CPAP   Spinal stenosis        Has the patient had major surgery during 100 days prior to admission? No   Family History   family history includes Heart disease in his father and mother.   Current Medications   Current Facility-Administered Medications:    acetaminophen (TYLENOL) tablet 650 mg, 650 mg, Oral, Q4H PRN, 650 mg at 11/17/21 0835 **OR** acetaminophen (TYLENOL) 160 MG/5ML solution 650 mg, 650 mg, Per Tube, Q4H PRN **OR** acetaminophen (TYLENOL) suppository 650 mg, 650 mg, Rectal, Q4H PRN, Marcelyn Bruins, MD   aspirin EC tablet 81 mg, 81 mg, Oral, Daily, Marcelyn Bruins, MD, 81 mg at 11/19/21 0958   atorvastatin (LIPITOR) tablet 40 mg, 40 mg, Oral, Daily, Sheikh, Omair Latif, DO, 40 mg at 11/19/21 7078   doxycycline (VIBRA-TABS) tablet 100 mg, 100 mg, Oral, Q12H, Bonnielee Haff, MD, 100 mg at 11/19/21 0958   enoxaparin (LOVENOX) injection 40 mg, 40 mg, Subcutaneous, Q24H, Marcelyn Bruins, MD, 40 mg at 11/18/21 2201   feeding supplement (ENSURE ENLIVE / ENSURE PLUS) liquid 237 mL, 237 mL, Oral, BID BM, Bonnielee Haff, MD, 237 mL at 11/17/21 1436   multivitamin with minerals tablet 1 tablet, 1 tablet, Oral, Daily, Bonnielee Haff, MD, 1 tablet at 11/19/21 0958   pantoprazole (PROTONIX) EC tablet 40 mg, 40 mg, Oral, Daily, Marcelyn Bruins, MD, 40 mg at 11/19/21 6754   potassium chloride SA (KLOR-CON M) CR tablet 40 mEq, 40 mEq, Oral, Once, Bonnielee Haff, MD   predniSONE (DELTASONE) tablet 20 mg, 20 mg, Oral, Q breakfast, Bonnielee Haff, MD, 20 mg at 11/19/21 0747   senna-docusate (Senokot-S) tablet 1 tablet, 1 tablet, Oral, QHS PRN, Marcelyn Bruins, MD   thiamine (VITAMIN B1) tablet 250 mg, 250 mg, Oral, Daily, 250 mg at 11/19/21 0957 **FOLLOWED BY** [START ON 11/23/2021] thiamine (VITAMIN B1) tablet 100  mg, 100 mg, Oral, Daily, Donnamae Jude, RPH   traMADol Veatrice Bourbon) tablet 50 mg, 50 mg, Oral, Q6H PRN, Bonnielee Haff, MD, 50 mg at 11/17/21 2241   Patients Current Diet:  Diet Order                  Diet Heart Room service appropriate? Yes; Fluid consistency: Thin  Diet effective now  Precautions / Restrictions Precautions Precautions: Fall Precaution Comments: permissive HTN, Rt shoulder and elbow pain Restrictions Weight Bearing Restrictions: No    Has the patient had 2 or more falls or a fall with injury in the past year? Yes   Prior Activity Level Community (5-7x/wk): Pt. active in the community PTA   Prior Functional Level Self Care: Did the patient need help bathing, dressing, using the toilet or eating? Independent   Indoor Mobility: Did the patient need assistance with walking from room to room (with or without device)? Independent   Stairs: Did the patient need assistance with internal or external stairs (with or without device)? Independent   Functional Cognition: Did the patient need help planning regular tasks such as shopping or remembering to take medications? Independent   Patient Information Are you of Hispanic, Latino/a,or Spanish origin?: A. No, not of Hispanic, Latino/a, or Spanish origin What is your race?: A. White Do you need or want an interpreter to communicate with a doctor or health care staff?: 0. No   Patient's Response To:  Health Literacy and Transportation Is the patient able to respond to health literacy and transportation needs?: No Health Literacy - How often do you need to have someone help you when you read instructions, pamphlets, or other written material from your doctor or pharmacy?: Sometimes In the past 12 months, has lack of transportation kept you from medical appointments or from getting medications?: No In the past 12 months, has lack of transportation kept you from meetings, work, or from getting  things needed for daily living?: No   Home Assistive Devices / [place] Devices/Equipment: Environmental consultant (specify type) Home Equipment: Adaptive equipment, Rolling Walker (2 wheels), Cane - single point   Prior Device Use: Indicate devices/aids used by the patient prior to current illness, exacerbation or injury? None of the above   Current Functional Level Cognition   Arousal/Alertness: Awake/alert Overall Cognitive Status: Impaired/Different from baseline Orientation Level: Oriented to person, Oriented to place Following Commands: Follows one step commands inconsistently, Follows one step commands with increased time Safety/Judgement: Decreased awareness of safety, Decreased awareness of deficits General Comments: Poor awareness, significantly increased time for sequencing. Difficulty with motor planning. Repetitively asking "what is it you want me to do". Awareness of urge to use restroom, unaware he was already actively going. Attention: Sustained Sustained Attention: Impaired Sustained Attention Impairment: Verbal basic, Functional basic Memory: Impaired Memory Impairment: Retrieval deficit, Decreased recall of new information, Decreased short term memory Decreased Short Term Memory: Verbal basic, Functional basic Awareness: Impaired Awareness Impairment: Intellectual impairment, Anticipatory impairment Problem Solving: Impaired Problem Solving Impairment: Verbal basic, Functional basic Executive Function: Organizing, Reasoning Reasoning: Impaired Reasoning Impairment: Verbal basic, Functional basic Organizing: Impaired Organizing Impairment: Verbal basic, Functional basic Behaviors: Perseveration, Verbal agitation Safety/Judgment: Impaired Comments: Pt pulled IV out of arm prior to SLP arrival    Extremity Assessment (includes Sensation/Coordination)   Upper Extremity Assessment: RUE deficits/detail RUE Deficits / Details: Going for MRI of R elbow today RUE  Sensation: WNL RUE Coordination: WNL (but no movement shoulder or elbow) LUE Deficits / Details: WFL LUE Sensation: WNL LUE Coordination: WNL  Lower Extremity Assessment: Defer to PT evaluation RLE Deficits / Details: grossly pt is at 3/5 on Rt LE with hip flexion, knee extension, and plantar flexion, 4-/5 for dorsiflexion RLE Sensation: WNL RLE Coordination: WNL LLE Deficits / Details: grossly pt is at 4-/5 on Rt LE with hip flexion, knee extension, and dorsiflexion, 3/5 for plantarflexion LLE Sensation:  WNL LLE Coordination: WNL     ADLs   Overall ADL's : Needs assistance/impaired Eating/Feeding: Bed level, Set up Grooming: Wash/dry face, Bed level, Set up Upper Body Bathing: Minimal assistance, Bed level Lower Body Bathing: +2 for physical assistance, Maximal assistance, Bed level Upper Body Dressing : Moderate assistance, Bed level Lower Body Dressing: Total assistance, Bed level Toilet Transfer: Moderate assistance, +2 for physical assistance, Rolling walker (2 wheels), BSC/3in1 Toilet Transfer Details (indicate cue type and reason): Initially Min A for rise from low bed surface, but ultimately mod A to coordinate SPT to [facility], and mod A to rise from [facility]. Required min guard sitting on toilet due to poor safety awareness and decreased trunk strength/control Toileting- Clothing Manipulation and Hygiene: Total assistance, Sit to/from stand Toileting - Clothing Manipulation Details (indicate cue type and reason): Total A for posterior pericare. Functional mobility during ADLs: Moderate assistance, +2 for physical assistance, Rolling walker (2 wheels)     Mobility   Overal bed mobility: Needs Assistance Bed Mobility: Supine to Sit, Sit to Supine Supine to sit: +2 for physical assistance, Mod assist, HOB elevated Sit to supine: Mod assist, HOB elevated Sit to sidelying: Mod assist, +2 for safety/equipment General bed mobility comments: up to sit EOB with assist for scooting hips and  lifting trunk with much increased time and cues; assist to supine for each leg onto bed and lowering shoulders with cues and time     Transfers   Overall transfer level: Needs assistance Equipment used: Rolling walker (2 wheels) Transfers: Sit to/from Stand, Bed to chair/wheelchair/BSC Sit to Stand: Min assist, Mod assist, +2 physical assistance Bed to/from chair/wheelchair/BSC transfer type:: Step pivot Step pivot transfers: Mod assist, +2 physical assistance, Max assist General transfer comment: assist up to standing with min A initially with elevated height of bed and cues for anterior weight shift ; step to [facility] with +2 mod A for walker management and proximity and cues for posture; stood from [facility] with some lifting assist to RW and stepping to bed wtih much increased time, mod cues and assist for RW, cues for posture, etc     Ambulation / Gait / Stairs / Wheelchair Mobility   Ambulation/Gait General Gait Details: unable     Posture / Balance Dynamic Sitting Balance Sitting balance - Comments: mod A for posterior bias on EOB, after on BSC leaning forward to have BM and minguard for safety due to poor safety awareness Balance Overall balance assessment: Needs assistance Sitting-balance support: Feet supported Sitting balance-Leahy Scale: Poor Sitting balance - Comments: mod A for posterior bias on EOB, after on BSC leaning forward to have BM and minguard for safety due to poor safety awareness Postural control: Posterior lean Standing balance support: Bilateral upper extremity supported Standing balance-Leahy Scale: Poor Standing balance comment: +2 for safety static standing with RW posterior bias initially     Special needs/care consideration Skin intact    Previous Home Environment (from acute therapy documentation) Living Arrangements: Alone  Lives With: Other (Comment) (potentially girlfriend; unsure of accuracy of information given by pt) Available Help at Discharge: Available  PRN/intermittently Type of Home: House Home Layout: Two level Alternate Level Stairs-Number of Steps: Laundry and computer in basement. Pt likes to access but necessary. Home Access: Stairs to enter Entrance Stairs-Number of Steps: 1 (threshold) Bathroom Shower/Tub: Tub/shower unit (Also with walk in shower but pt prefers tub) Biochemist, clinical: [place]: No Additional Comments: pt typically very active and independent. he workouts at  the gym ~5x/week and   Discharge Living Setting Plans for Discharge Living Setting: Patient's home Type of Home at Discharge: Other (Comment) Discharge Home Layout: Able to live on main level with bedroom/bathroom Discharge Home Access: Elevator Discharge Bathroom Shower/Tub: Tub/shower unit Discharge Bathroom Toilet: Standard Discharge Bathroom Accessibility: Yes How Accessible: Accessible via walker, Accessible via wheelchair Does the patient have any problems obtaining your medications?: No   Social/Family/Support Systems Patient Roles: Partner Contact Information: [phone] Anticipated Caregiver: Nelda Bucks Anticipated Caregiver's Contact Information: [phone] Ability/Limitations of Caregiver: can provide min A (Family can hire private [place] when Webb Silversmith is gone) Careers adviser: Evenings only Discharge Plan Discussed with Primary Caregiver: Yes Is Caregiver In Agreement with Plan?: No Does Caregiver/Family have Issues with Lodging/Transportation while Pt is in Rehab?: No   Goals Patient/Family Goal for Rehab: PT/OT/SLP Min A Expected length of stay: 14-16 days Pt/Family Agrees to Admission and willing to participate: Yes Program Orientation Provided & Reviewed with Pt/Caregiver Including Roles  & Responsibilities: Yes   Decrease burden of Care through IP rehab admission: none   Possible need for SNF placement upon discharge: none    Patient Condition: I have reviewed medical records from [facility], spoken with CM, and patient and son. I met with patient at the bedside for inpatient rehabilitation assessment.  Patient will benefit from ongoing PT, OT, and SLP, can actively participate in 3 hours of therapy a day 5 days of the week, and can make measurable gains during the admission.  Patient will also benefit from the coordinated team approach during an Inpatient Acute Rehabilitation admission.  The patient will receive intensive therapy as well as Rehabilitation physician, nursing, social worker, and care management interventions.  Due to safety, skin/wound care, disease management, medication administration, pain management, and patient education the patient requires 24 hour a day rehabilitation nursing.  The patient is currently min+2-mod+2  with mobility and basic ADLs.  Discharge setting and therapy post discharge at home with home health is anticipated.  Patient has agreed to participate in the Acute Inpatient Rehabilitation Program and will admit today.   Preadmission Screen Completed By:  Genella Mech, 11/19/2021 10:48 AM ______________________________________________________________________   Discussed status with Dr. Letta Pate  on 11/20/21 at 68 and received approval for admission today.   Admission Coordinator:  Genella Mech, CCC-SLP, time 932/Date 11/20/21    Assessment/Plan: Diagnosis:  Right corona radiata infarct  Does the need for close, 24 hr/day Medical supervision in concert with the patient's rehab needs make it unreasonable for this patient to be served in a less intensive setting? Yes Co-Morbidities requiring supervision/potential complications: thiamine deficiency  Due to bladder management, bowel management, safety, skin/wound care, disease management, medication administration, pain management, and patient education, does the patient require 24 hr/day rehab nursing? Yes Does the patient require coordinated care of a physician, rehab nurse, PT, OT,  and SLP to address physical and functional deficits in the context of the above medical diagnosis(es)? Yes Addressing deficits in the following areas: balance, endurance, locomotion, strength, transferring, bowel/bladder control, bathing, dressing, feeding, toileting, cognition, and psychosocial support Can the patient actively participate in an intensive therapy program of at least 3 hrs of therapy 5 days a week? Yes The potential for patient to make measurable gains while on inpatient rehab is good Anticipated functional outcomes upon discharge from inpatient rehab: supervision PT, supervision OT, supervision SLP Estimated rehab length of stay to reach the above functional goals is: 14-16d Anticipated discharge destination:  Home 10. Overall Rehab/Functional Prognosis: good     MD Signature: Charlett Blake M.D. [place] [place] [facility] Fellow Am Acad of Phys Med and Rehab Diplomate Am Board of Electrodiagnostic Med

## 2021-11-21 NOTE — Evaluation (Signed)
Occupational Therapy Assessment and Plan  Patient Details  Name: Jose [name] MRN: [id number] Date of Birth: 1935-07-06  OT Diagnosis: apraxia, ataxia, cognitive deficits, and muscle weakness (generalized) Rehab Potential: Rehab Potential (ACUTE ONLY): Good ELOS: 2-3 weeks   Today's Date: 11/21/2021 OT Individual Time: 1335-1440 OT Individual Time Calculation (min): 65 min     Hospital Problem: Principal Problem:   Subcortical infarction ([place]) Active Problems:   Pressure injury of skin   Past Medical History:  Past Medical History:  Diagnosis Date   Acute meniscal tear of knee    RIGHT KNEE   Arthritis    Hives    PER PT UNKNOWN WHY   IBS (irritable bowel syndrome)    Mild sleep apnea    PER STUDY  11-30-2010--  NO CPAP   Neuromuscular disorder ([place])    Spinal stenosis    Past Surgical History:  Past Surgical History:  Procedure Laterality Date   APPENDECTOMY     KNEE ARTHROSCOPY WITH LATERAL MENISECTOMY Right 04/12/2012   Procedure: RIGHT KNEE ARTHROSCOPY WITH PARTIAL MEDIAL AND LATERAL MENISECTOMY;  Surgeon: Magnus Sinning, MD;  Location: [facility];  Service: Orthopedics;  Laterality: Right;   SHOULDER ARTHROSCOPY WITH ROTATOR CUFF REPAIR AND SUBACROMIAL DECOMPRESSION  12-21-1999  &  01-28-2004    Assessment & Plan Clinical Impression: .  86 y.o. male with medical history significant of alcohol use, GERD, thiamine deficiency, ataxia who presented to the [facility] ED with ongoing generalized pain.  With additional information from friend EDP was able to determine the patient had stopped drinking 4 to 5 days ago.  Also that he has years of issues with his balance and weakness however he has had significant increase in his confusion and weakness in the last 4 to 5 days (patient initially stated he did not know when he quit in the ED, on my exam he states that his girlfriend is making it up and that it was 4 to 5 weeks ago).Vital signs in the ED  significant for heart rate in the 40s to 120s, blood pressure in the 035K to 093 systolic, respiratory rate in the 20s to 30s.  Episode of hypoxia documented however this has not been persistent.  Lab work-up included CMP with glucose 126, T. bili 1.3.  CBC with leukocytosis to 14.7.  Lipase normal.  Urinalysis pending.  Ethanol level negative.  Ammonia level negative.  Pending labs include B1, B12, folate, TSH.  CT head showed new focal hypodense lesion.  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  Neurology was consulted and recommended stroke work-up as well as administration of thiamine and further encephalopathy work-up.  Patient received thiamine and IV fluids in the ED. No signs of ETOH withdrawal this admission. Echocardiogram shows normal systolic function. EEG did not show any epileptiform activity. Pt. Also with swelling of the right elbow area around with some erythema and warmth noted.  X-ray was done which did not show any obvious fractures.    Plain films did not show any acute abnormalities.  CT of the elbow was subsequently done which showed joint effusion and soft tissue swelling.  Uric acid level was normal at 4.5.  ESR and CRP was significantly high.  WBC was noted to be high as well.Orthopedics was subsequently consulted.  Patient underwent joint aspiration on 10/26.  Gram stain does not show any organisms.  He does have intracellular monosodium urate crystals.  Synovial fluid more consistent with acute gout  and inflammatory arthritis rather than infectious arthritis.  Cultures without any growth so far. Started on doxycycline. Per ortho, no plan for surgery at this time PT, SLP,  and OT saw Pt. And recommended CIR to assist return to PLOF.    Patient transferred to CIR on 11/20/2021 .    Patient currently requires mod with basic self-care skills secondary to muscle weakness, decreased coordination, decreased motor planning, decreased awareness, decreased problem solving,  decreased safety awareness, and decreased memory, and decreased sitting balance, decreased standing balance, decreased postural control, and decreased balance strategies.  Prior to hospitalization, patient could complete ADLs with independent .  Patient will benefit from skilled intervention to increase independence with basic self-care skills prior to discharge home with care partner.  Anticipate patient will require 24 hour supervision and follow up home health.  OT - End of Session Activity Tolerance: Tolerates 10 - 20 min activity with multiple rests Endurance Deficit: Yes Endurance Deficit Description: he tends to move very slowly and need extra time to complete tasks OT Assessment Rehab Potential (ACUTE ONLY): Good OT Patient demonstrates impairments in the following area(s): Balance;Cognition;Endurance;Motor;Perception;Safety OT Basic ADL's Functional Problem(s): Bathing;Dressing;Toileting OT Transfers Functional Problem(s): Toilet;Tub/Shower OT Additional Impairment(s): None OT Plan OT Intensity: Minimum of 1-2 x/day, 45 to 90 minutes OT Frequency: 5 out of 7 days OT Duration/Estimated Length of Stay: 2-3 weeks OT Treatment/Interventions: Balance/vestibular training;Cognitive remediation/compensation;Discharge planning;DME/adaptive equipment instruction;Functional mobility training;Psychosocial support;Patient/family education;Self Care/advanced ADL retraining;Therapeutic Activities;UE/LE Strength taining/ROM;Therapeutic Exercise;UE/LE Coordination activities;Visual/perceptual remediation/compensation OT Self Feeding Anticipated Outcome(s): independent OT Basic Self-Care Anticipated Outcome(s): supervision OT Toileting Anticipated Outcome(s): supervision OT Bathroom Transfers Anticipated Outcome(s): supervision OT Recommendation Patient destination: Home Follow Up Recommendations: Home health OT Equipment Recommended: To be determined   OT Evaluation Precautions/Restrictions   Precautions Precautions: Fall Precaution Comments: permissive HTN, Rt shoulder and elbow pain Restrictions Weight Bearing Restrictions: No    Vital Signs Therapy Vitals Temp: 97.7 F (36.5 C) Temp Source: Oral Pulse Rate: 98 Resp: 16 BP: 120/75 Patient Position (if appropriate): Lying Oxygen Therapy SpO2: 95 % O2 Device: Room Air Pain Pain Assessment Pain Score: 5  Pain Type: Acute pain Pain Location: Shoulder Pain Orientation: Right Pain Descriptors / Indicators: Aching Pain Onset: With Activity Pain Intervention(s): Rest Home Living/Prior Functioning Home Living Family/patient expects to be discharged to:: Private residence Living Arrangements: Alone Available Help at Discharge: Friend(s), Available PRN/intermittently Type of Home: House Home Access: Level entry Entrance Stairs-Number of Steps: 1 (threshold) Home Layout: Two level, Able to live on main level with bedroom/bathroom Alternate Level Stairs-Number of Steps: 12 steps to access basement Alternate Level Stairs-Rails: Right, Left Bathroom Shower/Tub: Tub/shower unit, Multimedia programmer: Standard Additional Comments: pt typically very active and independent. 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 Baseline Vision/History: 1 Wears glasses Ability to See in Adequate Light: 0 Adequate Patient Visual Report: No change from baseline Vision Assessment?: No apparent visual deficits Perception  Perception: Impaired (impaired awareness of body alignment) Praxis Praxis: Impaired Praxis Impairment Details: Motor  planning Cognition Cognition Overall Cognitive Status: Impaired/Different from baseline Arousal/Alertness: Awake/alert Orientation Level: Person;Place Memory: Impaired Memory Impairment: Retrieval deficit;Decreased recall of new information;Decreased short term memory Decreased Short  Term Memory: Verbal basic;Functional basic Attention: Selective;Alternating;Sustained Sustained Attention: Appears intact Sustained Attention Impairment: Verbal basic;Functional basic Selective Attention: Impaired Selective Attention Impairment: Functional basic;Verbal basic Awareness: Impaired Awareness Impairment: Intellectual impairment;Anticipatory impairment Problem Solving: Impaired Problem Solving Impairment: Verbal complex;Functional complex Executive Function: Organizing;Reasoning Reasoning: Impaired Reasoning Impairment: Functional basic;Verbal basic Safety/Judgment: Impaired Brief Interview for Mental Status (BIMS) Repetition of Three Words (First Attempt): 3 Temporal Orientation: Year: Correct Temporal Orientation: Month: Accurate within 5 days Temporal Orientation: Day: Correct Recall: "Sock": No, could not recall Recall: "Blue": Yes, no cue required Recall: "Bed": No, could not recall BIMS Summary Score: 11 Sensation Sensation Light Touch: Appears Intact Hot/Cold: Appears Intact Proprioception: Appears Intact Stereognosis: Not tested Coordination Gross Motor Movements are Fluid and Coordinated: No Fine Motor Movements are Fluid and Coordinated: No Finger Nose Finger Test: mild tremor bilaterally Motor  Motor Motor - Skilled Clinical Observations: ataxic and grossly uncoordinated  Trunk/Postural Assessment  Postural Control Trunk Control: decreased trunk control 2/2 ataxia; postural lean Righting Reactions: delayed Postural Limitations: poor within narrow base of support  Balance Static Sitting Balance Static Sitting - Level of Assistance: 5: Stand by assistance Dynamic  Sitting Balance Dynamic Sitting - Level of Assistance: 4: Min assist Static Standing Balance Static Standing - Level of Assistance: 4: Min assist Dynamic Standing Balance Dynamic Standing - Level of Assistance: 3: Mod assist Extremity/Trunk Assessment RUE Assessment General Strength Comments: 4/5 LUE Assessment General Strength Comments: 4/5  Care Tool Care Tool Self Care Eating   Eating Assist Level: Set up assist    Oral Care    Oral Care Assist Level: Set up assist    Bathing   Body parts bathed by patient: Right arm;Left arm;Chest;Abdomen;Front perineal area;Face;Right upper leg;Left upper leg Body parts bathed by helper: Right lower leg;Left lower leg;Buttocks   Assist Level: Moderate Assistance - Patient 50 - 74%    Upper Body Dressing(including orthotics)   What is the patient wearing?: Pull over shirt   Assist Level: Minimal Assistance - Patient > 75%    Lower Body Dressing (excluding footwear)   What is the patient wearing?: Underwear/pull up;Pants Assist for lower body dressing: Maximal Assistance - Patient 25 - 49%    Putting on/Taking off footwear   What is the patient wearing?: Non-skid slipper socks Assist for footwear: Maximal Assistance - Patient 25 - 49%       Care Tool Toileting Toileting activity   Assist for toileting: Maximal Assistance - Patient 25 - 49%     Care Tool Bed Mobility Roll left and right activity   Roll left and right assist level: Minimal Assistance - Patient > 75%    Sit to lying activity   Sit to lying assist level: Contact Guard/Touching assist    Lying to sitting on side of bed activity   Lying to sitting on side of bed assist level: the ability to move from lying on the back to sitting on the side of the bed with no back support.: Minimal Assistance - Patient > 75%     Care Tool Transfers Sit to stand transfer   Sit to stand assist level: Moderate Assistance - Patient 50 - 74%    Chair/bed transfer   Chair/bed  transfer assist level: Maximal Assistance - Patient 25 - 49%     Toilet transfer   Assist Level: Minimal Assistance - Patient > 75% (WITH use of grab bars)     Care Tool Cognition  Expression of Ideas and Wants Expression of Ideas and  Wants: 3. Some difficulty - exhibits some difficulty with expressing needs and ideas (e.g, some words or finishing thoughts) or speech is not clear  Understanding Verbal and Non-Verbal Content Understanding Verbal and Non-Verbal Content: 3. Usually understands - understands most conversations, but misses some part/intent of message. Requires cues at times to understand   Memory/Recall Ability Memory/Recall Ability : Current season;That he or she is in a hospital/hospital unit   Refer to Care Plan for Long Term Goals  SHORT TERM GOAL WEEK 1 OT Short Term Goal 1 (Week 1): pt will stand pivot wc to toilet with close S. OT Short Term Goal 2 (Week 1): Pt will be able to ambulate to toilet with RW with min A. OT Short Term Goal 3 (Week 1): Pt will demonstrate improved standing balance to be able to pull pants over hips with CGA.  Recommendations for other services: Neuropsych   Skilled Therapeutic Intervention ADL ADL Eating: Set up Grooming: Setup Upper Body Bathing: Setup Lower Body Bathing: Moderate assistance Upper Body Dressing: Minimal assistance Lower Body Dressing: Maximal assistance Toileting: Maximal assistance Toilet Transfer: Minimal assistance Toilet Transfer Method: Stand pivot Toilet Transfer Equipment: Raised toilet seat;Grab bars    Pt seen this session for initial evaluation and ADL training with a focus on balance and postural control. Explained role of OT and rehab process. Initially pt not receptive saying he told the doctor he would only agree to 2 day and he is physically exactly the same as he was before.  It was challenging having pt to be receptive to feedback and cues to ensure he was moving, transferring and standing safely.   When pt worked on bathing at the sink he was very Chartered loss adjuster, repeating areas saying he had not cleansed that area yet. He was also very hypersensitive.  He was sitting at the sink with his hands hanging in the sink, when I turned on the water he jumped and yelled "whoa"!  "I was not expecting to get water on my hands!" Pt would often make comments that did not make sense.  When getting back to bed his hand grazed a piece of plastic and he also jumped.  Pt has numerous skin injuries that he was not able to recall how he got them.   Pt resting in bed at end of session with all needs met.  Bed alarm set.   Discharge Criteria: Patient will be discharged from OT if patient refuses treatment 3 consecutive times without medical reason, if treatment goals not met, if there is a change in medical status, if patient makes no progress towards goals or if patient is discharged from hospital.  The above assessment, treatment plan, treatment alternatives and goals were discussed and mutually agreed upon: by patient  [facility] 11/21/2021, 4:58 PM

## 2021-11-21 NOTE — Progress Notes (Signed)
Inpatient Rehabilitation Admission Medication Review by a Pharmacist  A complete drug regimen review was completed for this patient to identify any potential clinically significant medication issues.  High Risk Drug Classes Is patient taking? Indication by Medication  Antipsychotic No   Anticoagulant Yes, as an intravenous medication Enoxaparin - VTE ppx  Antibiotic Yes Doxycycline - cellulitis  Opioid No   Antiplatelet Yes ASA - CVA  Hypoglycemics/insulin No   Vasoactive Medication No   Chemotherapy No   Other Yes atorvastatin - HLD Protonix - GERD Ditropan - BPH Prednisone - gout Multivitamin, vitamin C, thiamine - supplement     Type of Medication Issue Identified Description of Issue Recommendation(s)  Drug Interaction(s) (clinically significant)     Duplicate Therapy     Allergy     No Medication Administration End Date     Incorrect Dose     Additional Drug Therapy Needed     Significant med changes from prior encounter (inform family/care partners about these prior to discharge).    Other       Clinically significant medication issues were identified that warrant physician communication and completion of prescribed/recommended actions by midnight of the next day:  Yes - now resolved   Pharmacist comments:  Enoxaparin '40mg'$  SQ added for VTE ppx Thiamine supplementation resumed   Time spent performing this drug regimen review (minutes): 20   Thank you for allowing pharmacy to be a part of this patient's care.  Ardyth Harps, PharmD Clinical Pharmacist

## 2021-11-21 NOTE — Evaluation (Signed)
Speech Language Pathology Assessment and Plan  Patient Details  Name: Jose [name] MRN: [id number] Date of Birth: 05/08/1935  SLP Diagnosis: Cognitive Impairments  Rehab Potential: Good ELOS: 2-3 weeks    Today's Date: 11/21/2021 SLP Individual Time: 4935-5217 SLP Individual Time Calculation (min): 60 min   Hospital Problem: Principal Problem:   Subcortical infarction ([place]) Active Problems:   Pressure injury of skin  Past Medical History:  Past Medical History:  Diagnosis Date   Acute meniscal tear of knee    RIGHT KNEE   Arthritis    Hives    PER PT UNKNOWN WHY   IBS (irritable bowel syndrome)    Mild sleep apnea    PER STUDY  11-30-2010--  NO CPAP   Neuromuscular disorder ([place])    Spinal stenosis    Past Surgical History:  Past Surgical History:  Procedure Laterality Date   APPENDECTOMY     KNEE ARTHROSCOPY WITH LATERAL MENISECTOMY Right 04/12/2012   Procedure: RIGHT KNEE ARTHROSCOPY WITH PARTIAL MEDIAL AND LATERAL MENISECTOMY;  Surgeon: Magnus Sinning, MD;  Location: [facility];  Service: Orthopedics;  Laterality: Right;   SHOULDER ARTHROSCOPY WITH ROTATOR CUFF REPAIR AND SUBACROMIAL DECOMPRESSION  12-21-1999  &  01-28-2004    Assessment / Plan / Recommendation Clinical Impression  Jose [name] is a 86 y.o. male with medical history significant of alcohol use, GERD, thiamine deficiency, ataxia who presented to the [facility] ED with ongoing generalized pain.  With additional information from friend EDP was able to determine the patient had stopped drinking 4 to 5 days ago.  Also that he has years of issues with his balance and weakness however he has had significant increase in his confusion and weakness in the last 4 to 5 days (patient initially stated he did not know when he quit in the ED, on my exam he states that his girlfriend is making it up and that it was 4 to 5 weeks ago).Vital signs in the ED significant for heart rate in  the 40s to 120s, blood pressure in the 471T to 953 systolic, respiratory rate in the 20s to 30s.  Episode of hypoxia documented however this has not been persistent.  Lab work-up included CMP with glucose 126, T. bili 1.3.  CBC with leukocytosis to 14.7.  Lipase normal.  Urinalysis pending.  Ethanol level negative.  Ammonia level negative.  Pending labs include B1, B12, folate, TSH.  CT head showed new focal hypodense lesion.  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  Neurology was consulted and recommended stroke work-up as well as administration of thiamine and further encephalopathy work-up.  Patient received thiamine and IV fluids in the ED. No signs of ETOH withdrawal this admission. Echocardiogram shows normal systolic function. EEG did not show any epileptiform activity. Pt. Also with swelling of the right elbow area around with some erythema and warmth noted.  X-ray was done which did not show any obvious fractures.    Plain films did not show any acute abnormalities.  CT of the elbow was subsequently done which showed joint effusion and soft tissue swelling.  Uric acid level was normal at 4.5.  ESR and CRP was significantly high.  WBC was noted to be high as well.Orthopedics was subsequently consulted.  Patient underwent joint aspiration on 10/26.  Gram stain does not show any organisms.  He does have intracellular monosodium urate crystals.  Synovial fluid more consistent with acute gout and inflammatory arthritis rather  than infectious arthritis.  Cultures without any growth so far. Started on doxycycline. Per ortho, no plan for surgery at this time PT, SLP,  and OT saw Pt. And recommended CIR to assist return to PLOF.   Pt presents with moderate-mild cognitive deficits with difficulty determining chronic verse acute deficits.Pt's girlfriend was present in the beginning of the session stating initial confusion that has improved, but not present to weigh in during assessment. Pt is in  denial of cognitive and physical changes related to right CVA or alcoholism, pt attributes impairments due to "chronic fatigue." Formal cognitive linguistic assessment Cognistat indicated moderate deficits in memory and mild deficits in selective attention and problem solving/construction task. Pt would benefit in ST services to aid in carryover of strategies initiated in PT/OT services and to continue to assess baseline verse acute cognitive deficits. Recommend 24 hour supervision and continued ST services.    Skilled Therapeutic Interventions          Skilled ST services focused on cognitive skills. SLP facilitated administration of cognitive linguistic formal assessment and provided education of results. SLP and pt collaborated to set goals for cognitive linguistic needs during length of stay. All questions answered to satisfaction.  Pt was left in room with call bell within reach and bed alarm set. SLP recommends to continue skilled services.    SLP Assessment  Patient will need skilled [facility] Services during CIR admission    Recommendations  Recommendations for Other Services: Neuropsych consult Patient destination: Home Follow up Recommendations: 24 hour supervision/assistance;Outpatient SLP Equipment Recommended: None recommended by SLP    SLP Frequency 3 to 5 out of 7 days   SLP Duration  SLP Intensity  SLP Treatment/Interventions 2-3 weeks  Minumum of 1-2 x/day, 30 to 90 minutes  Cognitive remediation/compensation;Internal/external aids;Cueing hierarchy;Multimodal communication approach;Functional tasks;Patient/family education;Medication managment    Pain Pain Assessment Pain Score: 0-No pain  Prior Functioning Cognitive/Linguistic Baseline: Baseline deficits Baseline deficit details: memory and processing Type of Home: House  Lives With: Alone Available Help at Discharge: Friend(s);Available PRN/intermittently Vocation: Retired  SLP  Evaluation Cognition Overall Cognitive Status: Impaired/Different from baseline Arousal/Alertness: Awake/alert Orientation Level: Oriented to person;Oriented to place;Oriented to situation;Disoriented to time Year: 2023 Attention: Selective;Alternating;Sustained Sustained Attention: Appears intact Selective Attention: Impaired Selective Attention Impairment: Functional basic;Verbal basic Memory: Impaired Memory Impairment: Retrieval deficit;Decreased recall of new information;Decreased short term memory Awareness: Impaired Awareness Impairment: Intellectual impairment;Anticipatory impairment Problem Solving: Impaired Problem Solving Impairment: Verbal complex;Functional complex Safety/Judgment: Impaired  Comprehension Auditory Comprehension Overall Auditory Comprehension: Impaired Yes/No Questions: Within Functional Limits Commands: Impaired Multistep Basic Commands: 0-24% accurate (due to recall) Conversation: Complex Interfering Components: Attention;Working Field seismologist: Repetition Retail banker: Not tested Reading Comprehension Reading Status: Not tested Expression Expression Primary Mode of Expression: Verbal Verbal Expression Overall Verbal Expression: Appears within functional limits for tasks assessed Oral Motor Oral Motor/Sensory Function Overall Oral Motor/Sensory Function: Within functional limits Motor Speech Overall Motor Speech: Appears within functional limits for tasks assessed  Care Tool Care Tool Cognition Ability to hear (with hearing aid or hearing appliances if normally used Ability to hear (with hearing aid or hearing appliances if normally used): 1. Minimal difficulty - difficulty in some environments (e.g. when person speaks softly or setting is noisy)   Expression of Ideas and Wants Expression of Ideas and Wants: 3. Some difficulty - exhibits some difficulty with expressing needs and ideas (e.g,  some words or finishing thoughts) or speech is not clear   Understanding Verbal and Non-Verbal  Content Understanding Verbal and Non-Verbal Content: 3. Usually understands - understands most conversations, but misses some part/intent of message. Requires cues at times to understand  Memory/Recall Ability Memory/Recall Ability : Current season;That he or she is in a hospital/hospital unit   Short Term Goals: Week 1: SLP Short Term Goal 1 (Week 1): Pt will demonstrate recall of daily information with use of external and internal aids with mod A verbal cues. SLP Short Term Goal 2 (Week 1): Pt will demonstrated mildly complex problem solving with supervision A verbal cues. SLP Short Term Goal 3 (Week 1): Pt will demonstrate intellectual awarenes and impact of deficits on functional abilities with mod A verbal cues. SLP Short Term Goal 4 (Week 1): Pt will demonstrate selective attention due to external and internal distractions with supervision A verbal cues in 20 minute intervals.  Refer to Care Plan for Long Term Goals  Recommendations for other services: Neuropsych  Discharge Criteria: Patient will be discharged from SLP if patient refuses treatment 3 consecutive times without medical reason, if treatment goals not met, if there is a change in medical status, if patient makes no progress towards goals or if patient is discharged from hospital.  The above assessment, treatment plan, treatment alternatives and goals were discussed and mutually agreed upon: by patient and by family  Miana Politte  [facility] 11/21/2021, 1:53 PM

## 2021-11-21 NOTE — Plan of Care (Signed)
  Problem: RH Balance Goal: LTG: Patient will maintain dynamic sitting balance (OT) Description: LTG:  Patient will maintain dynamic sitting balance with assistance during activities of daily living (OT) Flowsheets (Taken 11/21/2021 1709) LTG: Pt will maintain dynamic sitting balance during ADLs with: Supervision/Verbal cueing Goal: LTG Patient will maintain dynamic standing with ADLs (OT) Description: LTG:  Patient will maintain dynamic standing balance with assist during activities of daily living (OT)  Flowsheets (Taken 11/21/2021 1709) LTG: Pt will maintain dynamic standing balance during ADLs with: Supervision/Verbal cueing   Problem: Sit to Stand Goal: LTG:  Patient will perform sit to stand in prep for activites of daily living with assistance level (OT) Description: LTG:  Patient will perform sit to stand in prep for activites of daily living with assistance level (OT) Flowsheets (Taken 11/21/2021 1709) LTG: PT will perform sit to stand in prep for activites of daily living with assistance level: Supervision/Verbal cueing   Problem: RH Bathing Goal: LTG Patient will bathe all body parts with assist levels (OT) Description: LTG: Patient will bathe all body parts with assist levels (OT) Flowsheets (Taken 11/21/2021 1709) LTG: Pt will perform bathing with assistance level/cueing: Supervision/Verbal cueing   Problem: RH Dressing Goal: LTG Patient will perform upper body dressing (OT) Description: LTG Patient will perform upper body dressing with assist, with/without cues (OT). Flowsheets (Taken 11/21/2021 1709) LTG: Pt will perform upper body dressing with assistance level of: Set up assist Goal: LTG Patient will perform lower body dressing w/assist (OT) Description: LTG: Patient will perform lower body dressing with assist, with/without cues in positioning using equipment (OT) Flowsheets (Taken 11/21/2021 1709) LTG: Pt will perform lower body dressing with assistance level of:  Supervision/Verbal cueing   Problem: RH Toileting Goal: LTG Patient will perform toileting task (3/3 steps) with assistance level (OT) Description: LTG: Patient will perform toileting task (3/3 steps) with assistance level (OT)  Flowsheets (Taken 11/21/2021 1709) LTG: Pt will perform toileting task (3/3 steps) with assistance level: Set up assist   Problem: RH Toilet Transfers Goal: LTG Patient will perform toilet transfers w/assist (OT) Description: LTG: Patient will perform toilet transfers with assist, with/without cues using equipment (OT) Flowsheets (Taken 11/21/2021 1709) LTG: Pt will perform toilet transfers with assistance level of: Supervision/Verbal cueing   Problem: RH Tub/Shower Transfers Goal: LTG Patient will perform tub/shower transfers w/assist (OT) Description: LTG: Patient will perform tub/shower transfers with assist, with/without cues using equipment (OT) Flowsheets (Taken 11/21/2021 1709) LTG: Pt will perform tub/shower stall transfers with assistance level of: Supervision/Verbal cueing

## 2021-11-21 NOTE — Progress Notes (Signed)
Inpatient Rehabilitation Care Coordinator Assessment and Plan Patient Details  Name: Jose [name] MRN: [id number] Date of Birth: Feb 19, 1935  Today's Date: 11/21/2021  Hospital Problems: Principal Problem:   Subcortical infarction [facility]) Active Problems:   Pressure injury of skin  Past Medical History:  Past Medical History:  Diagnosis Date   Acute meniscal tear of knee    RIGHT KNEE   Arthritis    Hives    PER PT UNKNOWN WHY   IBS (irritable bowel syndrome)    Mild sleep apnea    PER STUDY  11-30-2010--  NO CPAP   Neuromuscular disorder ([place])    Spinal stenosis    Past Surgical History:  Past Surgical History:  Procedure Laterality Date   APPENDECTOMY     KNEE ARTHROSCOPY WITH LATERAL MENISECTOMY Right 04/12/2012   Procedure: RIGHT KNEE ARTHROSCOPY WITH PARTIAL MEDIAL AND LATERAL MENISECTOMY;  Surgeon: Magnus Sinning, MD;  Location: [facility];  Service: Orthopedics;  Laterality: Right;   SHOULDER ARTHROSCOPY WITH ROTATOR CUFF REPAIR AND SUBACROMIAL DECOMPRESSION  12-21-1999  &  01-28-2004   Social History:  reports that he has quit smoking. His smoking use included cigarettes. He has never used smokeless tobacco. He reports current alcohol use. He reports that he does not use drugs.  Family / Support Systems Marital Status: Divorced Patient Roles: Partner Spouse/Significant Other: Jose [name]-[phone] been with 22 years Children: [name] [phone]  [place] coming today to see Dad Anticipated Caregiver: Jose Ability/Limitations of Caregiver: Can be there with him but does not live with him. May can hire assist but do not think pt will allow this no buy in regarding deficits Caregiver Availability: Other (Comment) (What Jose [name] decides to do for pt he is argumentative) Family Dynamics: Close with one of his children but the other he does not speak too. His son from [place] is coming today to see him and check on him. Pt wants to be at home to  visit him and not be here. Plans on speaking with the MD about this  Social History Preferred language: English Religion: None Cultural Background: no issues Education: HS Health Literacy - How often do you need to have someone help you when you read instructions, pamphlets, or other written material from your doctor or pharmacy?: Sometimes Writes: Yes Employment Status: Retired Public relations account executive Issues: No issues Guardian/Conservator: None-according to MD pt is not fully capable of making his own decisions while here. will look toward his son who is his net of kin to make any decisions while here   Abuse/Neglect Abuse/Neglect Assessment Can Be Completed: Yes Physical Abuse: Denies Verbal Abuse: Denies Sexual Abuse: Denies Exploitation of patient/patient's resources: Denies Self-Neglect: Denies  Patient response to: Social Isolation - How often do you feel lonely or isolated from those around you?: Never  Emotional Status Pt's affect, behavior and adjustment status: Pt is motivated to get out of the hosptial he feels he does not need to be here and can manage on his own. He does not acknowledge his stroke and that he has any deficits. he wants to talk to the MD/PA regarding discharging him. Recent Psychosocial Issues: other health issues feels can manage at home now Psychiatric History: No history would benefit from seeing neuro-psych while here for cognitive screen and for substance abuse issues Substance Abuse History: ETOH-unclear when he quit drinking 4-5 days ago or weeks ago. But has not gone through withdrawals while here.  Patient / Family Perceptions, Expectations & Goals Pt/Family  understanding of illness & functional limitations: Pt is aware he is in the hospital but not sure what for. His partner does and tries to re-direct pt but is not successful. Pt does want to talk with the MD and or PA to ask them to discharge him, feels no need to be here. Premorbid  pt/family roles/activities: Father, partner, home owner, retiree, friend, etc Anticipated changes in roles/activities/participation: resume Pt/family expectations/goals: Pt states: " I'm ready to get out of here, I did not want to come here anyway."  Jose [name] states: " I know he needs to be here but he is going to argue about it."  [employer]: None Premorbid Home Care/DME Agencies: Other (Comment) (rw and cane) Transportation available at discharge: pt drives and Jose [name] does also Is the patient able to respond to transportation needs?: Yes In the past 12 months, has lack of transportation kept you from medical appointments or from getting medications?: No In the past 12 months, has lack of transportation kept you from meetings, work, or from getting things needed for daily living?: No Resource referrals recommended: Neuropsychology  Discharge Planning Living Arrangements: Alone Support Systems: Spouse/significant other, Children, Friends/neighbors Type of Residence: Private residence Insurance Resources: [employer] Financial Resources: [employer] Referred: No Living Expenses: Own Money Management: Patient Does the patient have any problems obtaining your medications?: No Home Management: self Patient/Family Preliminary Plans: Return home with Jose [name] assisting but not 24/7. Pt feels he can go home rght now and manage, it will be difficult for him to buy in to hiring assist and having 24/7 care at discharge. Will await therapy evaluations and work on discharge needs. Care Coordinator Barriers to Discharge: Decreased caregiver support, Lack of/limited family support, Behavior Care Coordinator Anticipated Follow Up Needs: HH/OP  Clinical Impression Pleasantly confused gentleman who is adamant on going home soon and not staying over night here, his son is coming to see him and he wants to be home with him. His partner of 55 years-Jose is here and tries to  re-direct without success. Will ask neuro-psych to see and await son to talk with him when here. Will await therapies evaluations and work on discharge needs.  Jose [name] 11/21/2021, 12:27 PM

## 2021-11-21 NOTE — Progress Notes (Signed)
PROGRESS NOTE   Subjective/Complaints:   Pt reports R elbow not real bothersome right now.  Doesn't need pain meds, even tylenol.   Not sleeping well- has used Xanax in past- but wants something for sleep.   Using condom catheter for bladder- not great control   ROS:  Pt denies SOB, abd pain, CP, N/V/C/D, and vision changes Except for HPI   Objective:   No results found. Recent Labs    11/19/21 0246 11/20/21 1550  WBC 7.2 8.3  HGB 12.5* 13.1  HCT 35.7* 38.8*  PLT 357 401*   Recent Labs    11/19/21 0246 11/20/21 1550  NA 137  --   K 3.6  --   CL 102  --   CO2 24  --   GLUCOSE 115*  --   BUN 14  --   CREATININE 0.81 0.83  CALCIUM 9.7  --     Intake/Output Summary (Last 24 hours) at 11/21/2021 1139 Last data filed at 11/21/2021 0842 Gross per 24 hour  Intake 957 ml  Output 1450 ml  Net -493 ml     Pressure Injury 11/20/21 Buttocks Left Stage 1 -  Intact skin with non-blanchable redness of a localized area usually over a bony prominence. deep red (Active)  11/20/21 1641  Location: Buttocks  Location Orientation: Left (closer to coccyx)  Staging: Stage 1 -  Intact skin with non-blanchable redness of a localized area usually over a bony prominence.  Wound Description (Comments): deep red  Present on Admission: Yes     Pressure Injury 11/20/21 Buttocks Right Stage 2 -  Partial thickness loss of dermis presenting as a shallow open injury with a red, pink wound bed without slough. (Active)  11/20/21 1642  Location: Buttocks  Location Orientation: Right  Staging: Stage 2 -  Partial thickness loss of dermis presenting as a shallow open injury with a red, pink wound bed without slough.  Wound Description (Comments):   Present on Admission: Yes    Physical Exam: Vital Signs Blood pressure (!) 136/90, pulse 92, temperature 98.1 F (36.7 C), resp. rate 16, height '5\' 4"'$  (1.626 m), weight 68.3 kg, SpO2 96  %.   General: awake, alert, appropriate, supine in bed; appears a little frail, NAD HENT: conjugate gaze; oropharynx moist CV: regular rate- in 90's; no JVD Pulmonary: CTA B/L; no W/R/R- good air movement GI: soft, NT, ND, (+)BS Psychiatric: appropriate; interactive/talkative Neurological: Ox3 Musculoskeletal:        General: Tenderness present. No swelling.     Comments: RIght elbow pain with ROM, limited endrange ext, no erythema - no change today Skin:    General: Skin is warm and dry.  Neurological:     Mental Status: He is alert.     Cranial Nerves: No dysarthria or facial asymmetry.     Motor: No tremor or abnormal muscle tone.     Coordination: Coordination abnormal.     Gait: Gait abnormal.     Comments: Motor strength is 4/5 bilateral deltoid 3/5 right tricep 4/5 left tricep 4/5 bilateral grip 4/5 bilateral hip flexor knee extensor ankle dorsiflexor Patient reports equal sensation bilateral hands and feet.   Assessment/Plan:  1. Functional deficits which require 3+ hours per day of interdisciplinary therapy in a comprehensive inpatient rehab setting. Physiatrist is providing close team supervision and 24 hour management of active medical problems listed below. Physiatrist and rehab team continue to assess barriers to discharge/monitor patient progress toward functional and medical goals  Care Tool:  Bathing              Bathing assist       Upper Body Dressing/Undressing Upper body dressing        Upper body assist      Lower Body Dressing/Undressing Lower body dressing            Lower body assist       Toileting Toileting    Toileting assist       Transfers Chair/bed transfer  Transfers assist           Locomotion Ambulation   Ambulation assist              Walk 10 feet activity   Assist           Walk 50 feet activity   Assist           Walk 150 feet activity   Assist           Walk 10 feet on  uneven surface  activity   Assist           Wheelchair     Assist               Wheelchair 50 feet with 2 turns activity    Assist            Wheelchair 150 feet activity     Assist          Blood pressure (!) 136/90, pulse 92, temperature 98.1 F (36.7 C), resp. rate 16, height '5\' 4"'$  (1.626 m), weight 68.3 kg, SpO2 96 %.  Medical Problem List and Plan: 1. Functional deficits secondary to right corona radiata infarct             -patient may  shower             -ELOS/Goals: 5 to 7 days with supervision goals  First day of evaluations- Con't CIR- PT and OT 2.  Antithrombotics:  -DVT/anticoagulation:  Pharmaceutical: Lovenox- started 10/30- wasn't in orders               -antiplatelet therapy: ASA '81mg'$  3. Pain Management: Tylenol for mild pain tramadol for moderate to severe pain 4. Mood/Behavior/Sleep: Melatonin '3mg'$    10/30- will try Trazodone 50 mg QHS for sleep             -antipsychotic agents: N/A 5. Neuropsych/cognition: This patient is not ?capable of making decisions on his own behalf. 6. Skin/Wound Care: monitor  order eucerin prn 7. Fluids/Electrolytes/Nutrition: check CMET in am, monitor I/Os, supplement with MVI and VIt C 9.  Thiamine deficiency cont B1 '250mg'$  qd through 10/31, start '100mg'$  per day on 11/23/21, pt states he was treated for this a couple years ago as well 10.  GERD- may be related to ETOH use, cont protonix '40mg'$  per day 11. GOut RIght elbow cont prednisone '20mg'$  per day x 5-7d depending on resolution  10/30- pain is pretty much resolved- if Cultures stay negative (per Ortho) and pain resolved, will stop early 12.  Overactive bladder  Ditropan XL '10mg'$  qhs, monitor for retention   10/30- using condom catheter at night  LOS: 1 days A FACE TO FACE EVALUATION WAS PERFORMED  [place] Kievit 11/21/2021, 11:39 AM

## 2021-11-21 NOTE — Progress Notes (Signed)
Physical Therapy Assessment and Plan  Patient Details  Name: LINDSEY HOMMEL MRN: [id number] Date of Birth: Oct 26, 1935  PT Diagnosis: Ataxia, Ataxic gait, Cognitive deficits, Coordination disorder, Difficulty walking, Impaired cognition, and Osteoarthritis Rehab Potential: Fair ELOS: 2-3 weeks   Today's Date: 11/21/2021 PT Individual Time: 4818-5631 PT Individual Time Calculation (min): 61 min    Hospital Problem: Principal Problem:   Subcortical infarction ([place]) Active Problems:   Pressure injury of skin   Past Medical History:  Past Medical History:  Diagnosis Date   Acute meniscal tear of knee    RIGHT KNEE   Arthritis    Hives    PER PT UNKNOWN WHY   IBS (irritable bowel syndrome)    Mild sleep apnea    PER STUDY  11-30-2010--  NO CPAP   Neuromuscular disorder ([place])    Spinal stenosis    Past Surgical History:  Past Surgical History:  Procedure Laterality Date   APPENDECTOMY     KNEE ARTHROSCOPY WITH LATERAL MENISECTOMY Right 04/12/2012   Procedure: RIGHT KNEE ARTHROSCOPY WITH PARTIAL MEDIAL AND LATERAL MENISECTOMY;  Surgeon: Magnus Sinning, MD;  Location: [facility];  Service: Orthopedics;  Laterality: Right;   SHOULDER ARTHROSCOPY WITH ROTATOR CUFF REPAIR AND SUBACROMIAL DECOMPRESSION  12-21-1999  &  01-28-2004    Assessment & Plan Clinical Impression: Patient is a 86 y.o. year old male  with medical history significant of alcohol use, GERD, thiamine deficiency, ataxia who presented to the [facility] ED with ongoing generalized pain.  With additional information from friend EDP was able to determine the patient had stopped drinking 4 to 5 days ago.  Also that he has years of issues with his balance and weakness however he has had significant increase in his confusion and weakness in the last 4 to 5 days (patient initially stated he did not know when he quit in the ED, on my exam he states that his girlfriend is making it up and that it was 4 to  5 weeks ago).Vital signs in the ED significant for heart rate in the 40s to 120s, blood pressure in the 497W to 263 systolic, respiratory rate in the 20s to 30s.  Episode of hypoxia documented however this has not been persistent.  Lab work-up included CMP with glucose 126, T. bili 1.3.  CBC with leukocytosis to 14.7.  Lipase normal.  Urinalysis pending.  Ethanol level negative.  Ammonia level negative.  Pending labs include B1, B12, folate, TSH.  CT head showed new focal hypodense lesion.  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  Neurology was consulted and recommended stroke work-up as well as administration of thiamine and further encephalopathy work-up.  Patient received thiamine and IV fluids in the ED. No signs of ETOH withdrawal this admission. Echocardiogram shows normal systolic function. EEG did not show any epileptiform activity. Pt. Also with swelling of the right elbow area around with some erythema and warmth noted.  X-ray was done which did not show any obvious fractures.    Plain films did not show any acute abnormalities.  CT of the elbow was subsequently done which showed joint effusion and soft tissue swelling.  Uric acid level was normal at 4.5.  ESR and CRP was significantly high.  WBC was noted to be high as well.Orthopedics was subsequently consulted.  Patient underwent joint aspiration on 10/26.  Gram stain does not show any organisms.  He does have intracellular monosodium urate crystals.  Synovial fluid more  consistent with acute gout and inflammatory arthritis rather than infectious arthritis.  Cultures without any growth so far. Started on doxycycline. Per ortho, no plan for surgery at this time PT, SLP,  and OT saw Pt. And recommended CIR to assist return to PLOF.    Patient transferred to CIR on 11/20/2021 .   Patient currently requires max with mobility secondary to decreased cardiorespiratoy endurance, impaired timing and sequencing, unbalanced muscle  activation, ataxia, decreased coordination, and decreased motor planning, and decreased sitting balance, decreased standing balance, decreased postural control, and decreased balance strategies.  Prior to hospitalization, patient was independent  with mobility and lived with Alone in a House home.  Home access is  Level entry.  Patient will benefit from skilled PT intervention to maximize safe functional mobility, minimize fall risk, and decrease caregiver burden for planned discharge home with 24 hour supervision.  Anticipate patient will benefit from follow up OP at discharge.  PT - End of Session Activity Tolerance: Tolerates 30+ min activity with multiple rests Endurance Deficit: Yes Endurance Deficit Description: decreased PT Assessment Rehab Potential (ACUTE/IP ONLY): Fair PT Barriers to Discharge: Decreased caregiver support;Home environment access/layout;Behavior PT Barriers to Discharge Comments: pt lives alone with intermittent support from male friend, 2 level house with entry to main level and 12 steps to access basement PT Patient demonstrates impairments in the following area(s): Balance;Behavior;Endurance;Motor;Safety PT Transfers Functional Problem(s): Bed Mobility;Bed to Chair;Car PT Locomotion Functional Problem(s): Ambulation;Wheelchair Mobility PT Plan PT Intensity: Minimum of 1-2 x/day ,45 to 90 minutes PT Frequency: 5 out of 7 days PT Duration Estimated Length of Stay: 2-3 weeks PT Treatment/Interventions: Ambulation/gait training;Discharge planning;Functional mobility training;Psychosocial support;Therapeutic Activities;Balance/vestibular training;Disease management/prevention;Neuromuscular re-education;Therapeutic Exercise;Wheelchair propulsion/positioning;Cognitive remediation/compensation;DME/adaptive equipment instruction;Pain management;UE/LE Strength taining/ROM;Community reintegration;Patient/family education;Stair training;UE/LE Coordination activities PT  Transfers Anticipated Outcome(s): [employer] PT Locomotion Anticipated Outcome(s): Supervision PT Recommendation Recommendations for Other Services: Therapeutic Recreation consult Therapeutic Recreation Interventions: Other (comment) (lifestyle management, hx of ETOH abuse) Patient destination: Home Equipment Recommended: To be determined Equipment Details: owns RW   PT Evaluation Precautions/Restrictions Precautions Precautions: Fall Restrictions Weight Bearing Restrictions: No Pain Interference Pain Interference Pain Effect on Sleep: 1. Rarely or not at all Pain Interference with Therapy Activities: 0. Does not apply - I have not received rehabilitationtherapy in the past 5 days Pain Interference with Day-to-Day Activities: 1. Rarely or not at all Home Living/Prior [place]: Alone Available Help at Discharge: Friend(s);Available PRN/intermittently (male friend) Type of Home: House Home Access: Level entry Home Layout: Two level;Able to live on main level with bedroom/bathroom Alternate Level Stairs-Number of Steps: 12 steps to access basement Alternate Level Stairs-Rails: Right;Left Bathroom Shower/Tub: Tub/shower unit (also has walk in shower) Bathroom Toilet: Standard  Lives With: Alone Prior Function Level of Independence: Independent with gait;Independent with transfers  Able to Take Stairs?: Yes Driving: Yes Vocation: Other (Comment) Vocation Requirements: writes Conservation officer, nature Overall Cognitive Status: Impaired/Different from baseline Arousal/Alertness: Awake/alert Orientation Level: Oriented X4 Memory: Impaired Awareness: Impaired Problem Solving: Impaired Safety/Judgment: Impaired Sensation Sensation Light Touch: Appears Intact Proprioception: Appears Intact Additional Comments: reports N/T due to OA Coordination Gross Motor Movements are Fluid and Coordinated: No Fine Motor Movements are Fluid and  Coordinated: No Finger Nose Finger Test: mild tremor bilaterally Heel Shin Test: bilateral ataxia Motor  Motor Motor: Ataxia Motor - Skilled Clinical Observations: ataxic and grossly uncoordinated   Trunk/Postural Assessment  Cervical Assessment Cervical Assessment: Exceptions to [facility] (forward head posture) Thoracic Assessment Thoracic Assessment: Exceptions to [facility] (thoracic  kyphosis) Lumbar Assessment Lumbar Assessment: Exceptions to [facility] (posterior pelvic tilt) Postural Control Postural Control: Deficits on evaluation Trunk Control: decreased trunk control 2/2 ataxia Righting Reactions: delayed Postural Limitations: poor within narrow base of support  Balance Balance Balance Assessed: Yes Static Sitting Balance Static Sitting - Balance Support: Feet unsupported Static Sitting - Level of Assistance: 5: Stand by assistance (SBA) Dynamic Sitting Balance Dynamic Sitting - Balance Support: Feet unsupported Dynamic Sitting - Level of Assistance: 5: Stand by assistance (CGA) Dynamic Sitting - Balance Activities: Lateral lean/weight shifting;Forward lean/weight shifting Sitting balance - Comments: posterior loss of balance with dynamic LE activities Static Standing Balance Static Standing - Balance Support: Right upper extremity supported;Left upper extremity supported Static Standing - Level of Assistance: 4: Min assist Dynamic Standing Balance Dynamic Standing - Balance Support: Bilateral upper extremity supported Dynamic Standing - Level of Assistance: 4: Min assist Dynamic Standing - Balance Activities: Lateral lean/weight shifting Extremity Assessment   RLE Assessment RLE Assessment: Exceptions to [facility] General Strength Comments: hip 5/5, knee 4/5, ankle 3/5 LLE Assessment LLE Assessment: Not tested General Strength Comments: hip 5/5, knee: 4/5, ankle: 3/5  Care Tool Care Tool Bed Mobility Roll left and right activity   Roll left and right assist level: Minimal Assistance  - Patient > 75%    Sit to lying activity   Sit to lying assist level: Contact Guard/Touching assist    Lying to sitting on side of bed activity   Lying to sitting on side of bed assist level: the ability to move from lying on the back to sitting on the side of the bed with no back support.: Minimal Assistance - Patient > 75%     Care Tool Transfers Sit to stand transfer   Sit to stand assist level: Moderate Assistance - Patient 50 - 74%    Chair/bed transfer   Chair/bed transfer assist level: Maximal Assistance - Patient 25 - 49%     Physiological scientist transfer assist level: Minimal Assistance - Patient > 75%      Care Tool Locomotion Ambulation   Assist level: 2 helpers Assistive device: Hand held assist Max distance: 70 ft  Walk 10 feet activity   Assist level: 2 helpers Assistive device: Hand held assist   Walk 50 feet with 2 turns activity   Assist level: 2 helpers Assistive device: Hand held assist  Walk 150 feet activity Walk 150 feet activity did not occur: Safety/medical concerns (unable to perform due to decreased balance, coordination and activity tolerance)      Walk 10 feet on uneven surfaces activity   Assist level: 2 helpers Assistive device: Hand held assist  Stairs   Assist level: Minimal Assistance - Patient > 75% Stairs assistive device: 2 hand rails Max number of stairs: 12 (6 inches)  Walk up/down 1 step activity   Walk up/down 1 step (curb) assist level: Minimal Assistance - Patient > 75% Walk up/down 1 step or curb assistive device: 2 hand rails  Walk up/down 4 steps activity   Walk up/down 4 steps assist level: Minimal Assistance - Patient > 75% Walk up/down 4 steps assistive device: 2 hand rails  Walk up/down 12 steps activity   Walk up/down 12 steps assist level: Minimal Assistance - Patient > 75% Walk up/down 12 steps assistive device: 2 hand rails  Pick up small objects from floor Pick up small object from the  floor (from standing position) activity did  not occur: Safety/medical concerns (unable to perform 2/2 ataxia)      Wheelchair Is the patient using a wheelchair?: Yes Type of Wheelchair: Manual   Wheelchair assist level: Supervision/Verbal cueing Max wheelchair distance: 150 ft  Wheel 50 feet with 2 turns activity   Assist Level: Supervision/Verbal cueing  Wheel 150 feet activity   Assist Level: Supervision/Verbal cueing    Refer to Care Plan for Long Term Goals  SHORT TERM GOAL WEEK 1 PT Short Term Goal 1 (Week 1): Pt will perform sit to stand with min A with LRAD PT Short Term Goal 2 (Week 1): Pt will perform bed mobility CGA or less PT Short Term Goal 3 (Week 1): Pt will ambulate 15 ft CGA or less with LRAD  Recommendations for other services: Therapeutic Recreation  Other Lifestyle management   Skilled Therapeutic Intervention Pt received semi-reclined in bed, agreeable to PT evaluation and declines pain. Pt requires min A for supine to sit with bed features and PT assessed pain interference, sensation, strength, coordination, and balance seated edge of bed. Pt requires close (S) for safety for static sitting and CGA for dynamic sitting balance with LE activities. Pt requires mod A with sit to stand and max A with stand pivot with no AD to w/c. Pt transported to main gym for time management and energy conservation. Pt ambulated +2 with bilateral HHA 70 ft and presents with posterior lean, narrow base of support, shuffle, and scissoring gait pattern. PT provided pt with RW and pt progressed to min A for additional 70 ft following seated rest break. Pt navigated 12 steps with 2 HR's and required min A for balance. Pt propelled w/c with close supervision for 150 ft with B UE and requires min A for stand piovt with car transfer. Pt transported to room and requires CGA with sit to supine. PT educated pt on CIR policies and procedures and updated safety plan.     Mobility Bed Mobility Bed  Mobility: Rolling Right;Supine to Sit;Sit to Supine Rolling Right: Minimal Assistance - Patient > 75% Supine to Sit: Minimal Assistance - Patient > 75% Sit to Supine: Contact Guard/Touching assist Transfers Transfers: Sit to Stand;Stand to Sit;Stand Pivot Transfers Sit to Stand: Moderate Assistance - Patient 50-74% Stand to Sit: Moderate Assistance - Patient 50-74% Stand Pivot Transfers: Maximal Assistance - Patient 25 - 49% Stand Pivot Transfer Details: Verbal cues for precautions/safety;Visual cues/gestures for sequencing Transfer (Assistive device): None Locomotion  Gait Ambulation: Yes Gait Assistance: 2 Helpers Gait Distance (Feet): 70 Feet (ft) Assistive device: 2 person hand held assist Gait Assistance Details: Verbal cues for precautions/safety;Visual cues/gestures for sequencing Gait Gait: Yes Gait Pattern: Impaired Gait Pattern: Ataxic;Shuffle;Narrow base of support;Decreased step length - right;Decreased step length - left;Step-to pattern Gait velocity: decreased Stairs / Additional Locomotion Stairs: Yes Stairs Assistance: Minimal Assistance - Patient > 75% Stair Management Technique: Two rails Number of Stairs: 12 Height of Stairs: 6 (inches) Ramp: 2 Helpers Curb: Minimal Assistance - Patient >75% Architect: Yes Wheelchair Assistance: Chartered loss adjuster: Both upper extremities Wheelchair Parts Management: Needs assistance Distance: 150 ft   Discharge Criteria: Patient will be discharged from PT if patient refuses treatment 3 consecutive times without medical reason, if treatment goals not met, if there is a change in medical status, if patient makes no progress towards goals or if patient is discharged from hospital.  The above assessment, treatment plan, treatment alternatives and goals were discussed and mutually agreed upon: by patient  Verl Dicker Verl Dicker PT, DPT  11/21/2021, 12:56 PM

## 2021-11-21 NOTE — Discharge Summary (Signed)
Physician Discharge Summary  Patient ID: Jose [name] MRN: [id number] DOB/AGE: 1935-04-11 86 y.o.  Admit date: 11/20/2021 Discharge date: 11/28/2021  Discharge Diagnoses:  Principal Problem:   Subcortical infarction [facility]) Active Problems:   Pressure injury of skin Functional deficits secondary to right corona radiata infarct Thiamine deficiency Gastroesophageal reflux disease Gouty arthritis right elbow Overactive bladder Alcohol abuse OAB Insomnia  Discharged Condition: stable  Labs:  Basic Metabolic Panel: Recent Labs  Lab 11/27/21 0541  CREATININE 0.90    CBC:    Latest Ref Rng & Units 11/20/2021    3:50 PM 11/19/2021    2:46 AM 11/16/2021    6:25 AM  CBC  WBC 4.0 - 10.5 K/uL 8.3  7.2  11.9   Hemoglobin 13.0 - 17.0 g/dL 63.8  75.6  43.3   Hematocrit 39.0 - 52.0 % 38.8  35.7  38.0   Platelets 150 - 400 K/uL 401  357  276      Brief HPI:   Jose [name] is a 86 y.o. male presented to the emergency department complaining of generalized pain on 11/14/2021.  History of alcohol abuse and it was determined he had stopped drinking approximately 4 to 5 days prior to admission.  He also reported chronic weakness and balance issues which were exacerbated over the past few days.  High-dose thiamine started for concern of Warnicke's encephalopathy.  He was oriented to self only.  Imaging of the brain revealed acute infarct in right corona radiology neurology was consulted.  Aspirin 81 mg daily initiated.  Monitored on telemetry for arrhythmia.  EEG performed.  Altered mental status favored to be secondary to toxic metabolic encephalopathy, tensional in the setting of acute alcohol withdrawal.  TTE revealed normal LVEF, no intracardiac clot.  B12, folate and TSH within normal limits.  Recommend continuing [place] dosing regimen for thiamine until the vitamin B1 level results are finalized.  Continue aspirin and statin.   Hospital Course: Jose [name] was  admitted to rehab 11/20/2021 for inpatient therapies to consist of PT, ST and OT at least three hours five days a week. Past admission physiatrist, therapy team and rehab RN have worked together to provide customized collaborative inpatient rehab. Using condom catheter at nighttime due to OAB/urgency. Complaining if poor sleep and started on trazodone 50 mg at night 10/30. Elbow pain resolved. Continued on Ditropan XL for OAB. Follow-up by ortho service on 10/31>>cultures being surveilled and will continue treatment for gout. Protonix continued for GERD. Follow-up labs stable.  Blood pressures were monitored on TID basis and remained stable.  Rehab course: During patient's stay in rehab weekly team conferences were held to monitor patient's progress, set goals and discuss barriers to discharge. At admission, patient required mod assist with basic self-care skills and max assist with mobility.  He has had improvement in activity tolerance, balance, postural control as well as ability to compensate for deficits. He has had improvement in functional use RUE/LUE  and RLE/LLE as well as improvement in awareness. Min A for all ADLs and mobility.  Disposition: home Discharge disposition: 01-Home or Self Care      Diet: heart healthy  Special Instructions: No driving, alcohol consumption or tobacco use.   30-35 minutes were spent on discharge planning and discharge summary.  Discharge Instructions     Ambulatory referral to Neurology   Complete by: As directed    An appointment is requested in approximately: 4 weeks   Ambulatory referral to Physical Medicine Rehab  Complete by: As directed    Hospital follow-up (ask Dr. Berline Chough which provider he should see)   Discharge patient   Complete by: As directed    Discharge disposition: 01-Home or Self Care   Discharge patient date: 11/28/2021      Allergies as of 11/28/2021   No Known Allergies      Medication List     STOP taking these  medications    ALPRAZolam 0.5 MG tablet Commonly known as: XANAX   baclofen 10 MG tablet Commonly known as: LIORESAL   celecoxib 200 MG capsule Commonly known as: CELEBREX   methylphenidate 5 MG tablet Commonly known as: RITALIN       TAKE these medications    acetaminophen 325 MG tablet Commonly known as: TYLENOL Take 1-2 tablets (325-650 mg total) by mouth every 6 (six) hours as needed for mild pain.   ascorbic acid 500 MG tablet Commonly known as: VITAMIN C Take 1 tablet (500 mg total) by mouth daily.   aspirin 81 MG chewable tablet Chew 1 tablet (81 mg total) by mouth daily.   atorvastatin 40 MG tablet Commonly known as: LIPITOR Take 1 tablet (40 mg total) by mouth daily.   bisacodyl 5 MG EC tablet Commonly known as: DULCOLAX Take 1 tablet (5 mg total) by mouth daily as needed for moderate constipation.   citalopram 20 MG tablet Commonly known as: CELEXA Take 1 tablet (20 mg total) by mouth daily. Start taking on: November 29, 2021   docusate sodium 100 MG capsule Commonly known as: COLACE Take 1 capsule (100 mg total) by mouth 2 (two) times daily as needed for mild constipation.   multivitamin with minerals Tabs tablet Take 1 tablet by mouth daily.   omeprazole 20 MG capsule Commonly known as: PRILOSEC Take 1 capsule (20 mg total) by mouth every morning.   oxybutynin 10 MG 24 hr tablet Commonly known as: Ditropan XL Take 1 tablet (10 mg total) by mouth at bedtime.   thiamine 100 MG tablet Commonly known as: Vitamin B-1 Take 1 tablet (100 mg total) by mouth daily. Start taking on: November 29, 2021   traZODone 100 MG tablet Commonly known as: DESYREL Take 1 tablet (100 mg total) by mouth at bedtime.        Follow-up Information     Myrlene Broker, MD Follow up.   Specialty: Internal Medicine Why: Call in 1-2 days to make arrangements for hospital follow-up appointment. Contact information: [address] [place]  [address] [phone]         Genice Rouge, MD Follow up.   Specialty: Physical Medicine and Rehabilitation Why: office will call you to arrange your appt (sent) Contact information: [address] [place] [address] [phone]         [facility] Follow up.   Why: Call in 1-2 days to make arrangements for hospital follow-up appointment. Contact information: [address]     [address] [place] [address] [phone]        London Sheer, MD Follow up.   Specialty: Orthopedic Surgery Why: As needed Contact information: [address] [place] [address] [phone]                 Signed: Milinda Antis 11/28/2021, 9:38 AM

## 2021-11-21 NOTE — Progress Notes (Signed)
Met with patient. Brushing his teeth in bed. Oriented to rehab. Informed of team conference every Tuesday.  Wanted to know what he needs to do to get out of here. Informed to get stronger. Patient very focused on brushing his teeth.  Went over education and updated board.

## 2021-11-21 NOTE — Plan of Care (Signed)
Orthopedic Plan of Care  Cultures checked this morning - no growth to date for both aerobic and anaerobic cultures on the aspirate of the right elbow. No operative plan at this time. Can continue to treat for gout. Will continue to follow the cultures because in the setting of a positive culture would recommend operative irrigation and debridement. Patient can be weight bearing as tolerated on the right upper extremity.   Callie Fielding, MD Orthopedic Surgeon

## 2021-11-22 DIAGNOSIS — I639 Cerebral infarction, unspecified: Secondary | ICD-10-CM | POA: Diagnosis not present

## 2021-11-22 LAB — ANAEROBIC CULTURE W GRAM STAIN

## 2021-11-22 MED ORDER — ACETAMINOPHEN 325 MG PO TABS
325.0000 mg | ORAL_TABLET | Freq: Four times a day (QID) | ORAL | Status: DC | PRN
  Administered 2021-11-23: 650 mg via ORAL
  Filled 2021-11-22: qty 2

## 2021-11-22 MED ORDER — TRAMADOL HCL 50 MG PO TABS
100.0000 mg | ORAL_TABLET | Freq: Four times a day (QID) | ORAL | Status: DC | PRN
  Administered 2021-11-22: 100 mg via ORAL
  Filled 2021-11-22: qty 2

## 2021-11-22 NOTE — Progress Notes (Addendum)
Patient ID: Jose [name], male   DOB: 1935-09-24, 86 y.o.   MRN: [id number]  Met with pt to update him regarding team conference goals of supervision level and discharge date of 11/6. Pt feels he can not stay here that long ans wants to go home tomorrow. He feels it is not productive for him to be here and he could do just as well at home. Discussed he will need 24/7 supervision and currently would need physical assist. Pt not aware of his deficits and safety issues. He wants to talk with the MD and have messaged her regarding this. Will reach out to partner and his son who is here from [place] to visit. Will see if partner can provide 24/7 supervision at discharge. When yesterday she did not answer when asked if could provide 24/7 supervision.   3:22 PM Spoke with son-Jose [name] to discuss team conference goals of supervision target discharge date of 11/6. He is aware of his Dad's desire to go home and reports he is here to see him since in the hospital. He states: " I don;t know if I will be much help, he tends to do what he wants to do." He plans to go home on Sunday and if needed could stay until Monday. He will talk with his Dad when here later. Will work on discharge needs.

## 2021-11-22 NOTE — Progress Notes (Signed)
Occupational Therapy Session Note  Patient Details  Name: Jose [name] MRN: [id number] Date of Birth: September 10, 1935  Today's Date: 11/22/2021 OT Individual Time: 800-900 1st Session; 9326-7124 2nd Session  OT Individual Time Calculation (min): 60 min, 75 min    Short Term Goals: Week 1:  OT Short Term Goal 1 (Week 1): pt will stand pivot wc to toilet with close S. OT Short Term Goal 2 (Week 1): Pt will be able to ambulate to toilet with RW with min A. OT Short Term Goal 3 (Week 1): Pt will demonstrate improved standing balance to be able to pull pants over hips with CGA.  Skilled Therapeutic Interventions/Progress Updates:  1st Session:  Pt seen for skilled OT services this am meeting pt for 1st time. Pt was somewhat upset to learn he may needs to stay in rehab for longer than he anticipated which was 2-3 days despite Mds and therapists conveying up to a 2 week stay to ensure safety and falls prevention in the home for d/c. Pt was unable to rationalize the reason he was in rehab and said he could manage at home. OT allowed pt to voice concern and reinforced the purpose and goals set forth. Team Conference later am and team would discuss. Pt satisfied and able then to participate. Required close S with mod cues for techniques for supine to sit and moving to EOB for bed mobility. Pt reported mild stiffness but no pain as well as R RTC issues from 30 yr hx. Pt currently with condom ctheter for voiding mngt. OT assisted to manage catheter with all mobility. Spoke with nursing who assisted OT to encourage on removing and using urinal at later shower retraining visit.  OT assisted with sit to stand with education for hand placement and forward weight shifts as pt has posterior lean tendency. Amb with RW to sink side for oral care and hair combing with set up seated and CGA standing. Pt open to w/c propulsion 35 ft x 3 intervals to and from ortho gym for brief B UE 4 lb weight training for bicep  curls. Pt performed 3 sets of 15 reps with cues for pacing and breathing as well as joint protection especially for R sh. Once back in room, pt requested to rest back in bed and moved w/c to bed via SPT with bed rail support with CGA. Once supine, OT set bed exit and placed needs and nurse call button within reach.    2nd session:   Pt resting in bed upon OT arrival. Reported upset as d/c date set is not until Monday 11/8 and pt verbalized he could not stay more than 2-3 more days due to feeling confined. OT encouraged pt to participate as sometime d/c dates can be moved up with steady progress and pt reported he understood. Pt moved from supine to side with increased time and cues for body organization with close S. Pt able to amb to shower with RW and doff clothing with CGA. No condom catheter remained. Pt required set up for UB bathing, hair washing and dressing seated level and min A fading to CGA for LB bathing and dressing except socks. Pt issued and trained in [place] sponge for feet and back reach. Pt would benefit from shower seat as pt will most likely not be safe to move into bottom of tub as he prefers due to overall falls risk and weakness. Will continued to assess. Once completed bathing, pt amb with CGA with RW back  to EOB for UB pullover shirt with S and incontinence brief donning. OT instructed in urinal use with pt able to teach back understanding. Sit to supine with close S. Left pt bed level with bed alarm engaged, nurse call button and needs in reach.   Therapy Documentation Precautions:  Precautions Precautions: Fall Precaution Comments: permissive HTN, Rt shoulder and elbow pain Restrictions Weight Bearing Restrictions: No    Therapy/Group: Individual Therapy  Barnabas Lister 11/22/2021, 7:56 AM

## 2021-11-22 NOTE — Progress Notes (Signed)
Physical Therapy Session Note  Patient Details  Name: Jose [name] MRN: [id number] Date of Birth: [birth year]  Today's Date: 11/22/2021 PT Individual Time: 7579-7282 PT Individual Time Calculation (min): 42 min   Short Term Goals: Week 1:  PT Short Term Goal 1 (Week 1): Pt will perform sit to stand with min A with LRAD PT Short Term Goal 2 (Week 1): Pt will perform bed mobility CGA or less PT Short Term Goal 3 (Week 1): Pt will ambulate 15 ft CGA or less with LRAD  Skilled Therapeutic Interventions/Progress Updates:      Therapy Documentation Precautions:  Precautions Precautions: Fall Precaution Comments: permissive HTN, Rt shoulder and elbow pain Restrictions Weight Bearing Restrictions: No  Pt received semi-reclined in bed, agreeable to PT session and declines pain. Pt requires supervision for safety with supine to sit and min A with stand pivot transfer from bed to w/c with RW. Pt requires verbal cues for hand placement with RW. Pt transported by w/c for time management to main gym. Pt participated in high intensity gait training with RW and no AD. Pt ambulated ~400 ft with RW and CGA/min A and presents with shuffle gait and requires verbal cueing to increase stride length, foot clearance and AD management as pt keeps walker far ahead in front of him. Pt required seated rest break and transitioned to ambulation of 200 ft with no AD and requires mod A with verbal cues for foot clearance and to prevent anterior trunk lean. Pt transported to room and left semi-reclined in bed with all needs in reach and bed alarm on.    Therapy/Group: Individual Therapy  Verl Dicker Verl Dicker PT, DPT  11/22/2021, 7:49 AM

## 2021-11-22 NOTE — Patient Care Conference (Signed)
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 Courtney Heys Social Worker Present: Ovidio Kin, LCSW Nurse Present: Other (comment) Tacy Learn, RN) PT Present: Other (comment) Verl Dicker, PT) OT Present: Jamey Ripa, OT SLP Present: Helaine Chess, SLP PPS Coordinator present : Gunnar Fusi, SLP     Current Status/Progress Goal Weekly Team Focus  Bowel/Bladder   Continent of bowel, condom cath currently in place  D/C condom cath  Assist with toileting as needed   Swallow/Nutrition/ Hydration             ADL's   close S bed mobility, set up UB self care, min A LB self care, CGA transfer to toilet, set up grooming  Supervision  progress balance and activity tolerance for ADL's and basic functional mobility   Mobility   min bed mobility, min transfers & gait 70 ft with RW & steps x 12 with 2 HR's  supervision  bed mobility, transfers, gait, stairs, balance/coordiantion   Communication             Safety/Cognition/ Behavioral Observations  max A intellectual awareness (difficulity distinguishing baseline verse acute; denial of alcholism) mod A recall and mild higher level attention/problem solving  Supervision A  memory strategies, selective attention, intellectual/anticipatory awareness and mildly complex problem solving -medication,money,time management    Pain   No c/o pain at this time  Remain pain free  Assess Qshift and prn   Skin   St 1 to Lt buttocks, St 2 to Rt buttocks  Promote healing and prevention of further deterioration  Assess Qshift and prn     Discharge Planning:  HOme alone with partner assisting and possibly hiring assist but do not feel pt will buy in to this. Feels can go home right now and manage. Wants to go home ASAP. Son here from [place] to visit   Team Discussion: CVA. Incontinent of urine. Continent of bowel. Condom cath to be removed. Pain to back and elbow. Pressure injury to right/left buttocks. Skin tear to right buttocks. Family/partner aware that 24/7 care needed. Patient continues to have poor safety awareness. Patient on target to meet rehab goals: no, patient unaware of deficiets  *See Care Plan and progress notes for long and short-term goals.   Revisions to Treatment Plan:  Medication adjustments, monitor labs  Teaching Needs: Medications, safety, gait/transfer training, skin/wound care, etc.  Current Barriers to Discharge: Inaccessible home environment, Medical stability, Home enviroment access/layout, Incontinence, Wound care, Lack of/limited family support, Medication compliance, and Behavior  Possible Resolutions to Barriers: Family education, nursing education, skin/wound care, order recommended DME     Medical Summary Current Status: Thnks he can drive and has "no issues"- not sure if continent- using condom cath- at night- stage I L and Stage II R buttocks and skin ntear- will remove condom catheter- can have only at  night  Barriers to Discharge: Behavior;Medication compliance;Decreased family/caregiver support;Home enviroment access/layout;Medical stability;Incontinence;Other (comments)  Barriers to Discharge Comments: wants ot just walk around the room on his own, but doens't want to participate with therapy well- min A- such poor safety awareness- supervision goals- for PT/OT, but at  baseline cognitively- mod A; - CognitiveSTAT- mod defcits in memory/cogntition- adversarial- Possible Resolutions to [employer] Focus: mod A cognition at best- has Wernicke's- recommend- 11/6-   Continued Need for Acute Rehabilitation Level of Care: The patient requires daily medical management by a physician with specialized training in physical medicine and rehabilitation for the following reasons: Direction of a multidisciplinary physical rehabilitation program to maximize functional independence : Yes Medical management of patient stability for increased activity during participation in an intensive rehabilitation regime.: Yes Analysis of laboratory values and/or radiology reports with any subsequent need for medication adjustment and/or medical intervention. : Yes   I attest that I was present, lead the team conference, and concur with the assessment and plan of the team.   Ernest Pine 11/22/2021, 4:48 PM

## 2021-11-22 NOTE — Plan of Care (Signed)
Orthopedic Plan of Care  Cultures checked this morning - no growth to date for both aerobic and anaerobic cultures on the aspirate of the right elbow. Aerobic cultures have finalized. No operative plan at this time. Can continue to treat for gout. Will follow his anaerobic cultures whether remains admitted or discharged. Can follow up with me on an as needed basis.  Callie Fielding, MD Orthopedic Surgeon

## 2021-11-22 NOTE — Progress Notes (Signed)
Physical Therapy Session Note  Patient Details  Name: Jose [name] MRN: [id number] Date of Birth: 11-08-1935  Today's Date: 11/22/2021 PT Individual Time: 1301-1330 PT Individual Time Calculation (min): 29 min   Short Term Goals: Week 1:  PT Short Term Goal 1 (Week 1): Pt will perform sit to stand with min A with LRAD PT Short Term Goal 2 (Week 1): Pt will perform bed mobility CGA or less PT Short Term Goal 3 (Week 1): Pt will ambulate 15 ft CGA or less with LRAD  Skilled Therapeutic Interventions/Progress Updates: Pt presents semi-reclined in bed and agreeable to therapy.  Pt transfers sup to sit w/ supervision slowly from elevated HOB.  Pt transferred sit to stand w/ CGA and cues for hand placement.  Pt amb w/ RW and CGA up to 165' per trial.  Pt requires verbal cues for upright posture and walker management, w/ decreased foot clearance as fatigues.  Pt negotiated ramp w/ CGA and increased verbal cues for safe turn and management of RW.  Pt returns to room and transfers sit to supine w/ supervision.  Bed alarm on and all needs in reach.     Therapy Documentation Precautions:  Precautions Precautions: Fall Precaution Comments: permissive HTN, Rt shoulder and elbow pain Restrictions Weight Bearing Restrictions: No General:   Vital Signs:   Pain:no c/o pain but then states some on R arm w/ use.      Therapy/Group: Individual Therapy  Ladoris Gene 11/22/2021, 1:33 PM

## 2021-11-22 NOTE — Progress Notes (Signed)
PROGRESS NOTE   Subjective/Complaints:   Said slept last night- LBM yesterday.   "Thought was going home today and has to, or will leave anyway".  ROS:  Insists on leaving- refuses ot discuss other topics Except for HPI   Objective:   No results found. Recent Labs    11/20/21 1550  WBC 8.3  HGB 13.1  HCT 38.8*  PLT 401*   Recent Labs    11/20/21 1550  CREATININE 0.83    Intake/Output Summary (Last 24 hours) at 11/22/2021 1844 Last data filed at 11/22/2021 1817 Gross per 24 hour  Intake 957 ml  Output 2850 ml  Net -1893 ml     Pressure Injury 11/20/21 Buttocks Left Stage 1 -  Intact skin with non-blanchable redness of a localized area usually over a bony prominence. deep red (Active)  11/20/21 1641  Location: Buttocks  Location Orientation: Left (closer to coccyx)  Staging: Stage 1 -  Intact skin with non-blanchable redness of a localized area usually over a bony prominence.  Wound Description (Comments): deep red  Present on Admission: Yes     Pressure Injury 11/20/21 Buttocks Right Stage 2 -  Partial thickness loss of dermis presenting as a shallow open injury with a red, pink wound bed without slough. (Active)  11/20/21 1642  Location: Buttocks  Location Orientation: Right  Staging: Stage 2 -  Partial thickness loss of dermis presenting as a shallow open injury with a red, pink wound bed without slough.  Wound Description (Comments):   Present on Admission: Yes    Physical Exam: Vital Signs Blood pressure 114/76, pulse (!) 104, temperature 97.8 F (36.6 C), resp. rate 16, height '5\' 4"'$  (1.626 m), weight 68.3 kg, SpO2 97 %.    General: awake, alert, but confused and irritable; NAD HENT: conjugate gaze; oropharynx moist CV: regular rate; no JVD Pulmonary: CTA B/L; no W/R/R- good air movement GI: soft, NT, ND, (+)BS Psychiatric: inappropriate, perseverative- insists on leaving and got  belligerent when didn't say he could go Neurological: Ox1-2 Musculoskeletal:        General: Tenderness present. No swelling.     Comments: RIght elbow pain with ROM, limited endrange ext, no erythema - no change today Skin:    General: Skin is warm and dry.  Neurological:     Mental Status: He is alert.     Cranial Nerves: No dysarthria or facial asymmetry.     Motor: No tremor or abnormal muscle tone.     Coordination: Coordination abnormal.     Gait: Gait abnormal.     Comments: Motor strength is 4/5 bilateral deltoid 3/5 right tricep 4/5 left tricep 4/5 bilateral grip 4/5 bilateral hip flexor knee extensor ankle dorsiflexor Patient reports equal sensation bilateral hands and feet.   Assessment/Plan: 1. Functional deficits which require 3+ hours per day of interdisciplinary therapy in a comprehensive inpatient rehab setting. Physiatrist is providing close team supervision and 24 hour management of active medical problems listed below. Physiatrist and rehab team continue to assess barriers to discharge/monitor patient progress toward functional and medical goals  Care Tool:  Bathing    Body parts bathed by patient: Right arm, Left arm,  Chest, Abdomen, Front perineal area, Face, Right upper leg, Left upper leg, Buttocks, Right lower leg, Left lower leg   Body parts bathed by helper: Right lower leg, Left lower leg, Buttocks     Bathing assist Assist Level: Contact Guard/Touching assist     Upper Body Dressing/Undressing Upper body dressing   What is the patient wearing?: Pull over shirt    Upper body assist Assist Level: Contact Guard/Touching assist    Lower Body Dressing/Undressing Lower body dressing      What is the patient wearing?: Underwear/pull up, Pants     Lower body assist Assist for lower body dressing: Minimal Assistance - Patient > 75%     Toileting Toileting    Toileting assist Assist for toileting: Minimal Assistance - Patient > 75%      Transfers Chair/bed transfer  Transfers assist     Chair/bed transfer assist level: Maximal Assistance - Patient 25 - 49%     Locomotion Ambulation   Ambulation assist      Assist level: Contact Guard/Touching assist Assistive device: Walker-rolling Max distance: 165   Walk 10 feet activity   Assist     Assist level: Contact Guard/Touching assist Assistive device: Walker-rolling   Walk 50 feet activity   Assist    Assist level: Contact Guard/Touching assist Assistive device: Walker-rolling    Walk 150 feet activity   Assist Walk 150 feet activity did not occur: Safety/medical concerns (unable to perform due to decreased balance, coordination and activity tolerance)  Assist level: Contact Guard/Touching assist Assistive device: Walker-rolling    Walk 10 feet on uneven surface  activity   Assist     Assist level: Contact Guard/Touching assist (ramp) Assistive device: Walker-rolling   Wheelchair     Assist Is the patient using a wheelchair?: Yes Type of Wheelchair: Manual    Wheelchair assist level: Supervision/Verbal cueing Max wheelchair distance: 150 ft    Wheelchair 50 feet with 2 turns activity    Assist        Assist Level: Supervision/Verbal cueing   Wheelchair 150 feet activity     Assist      Assist Level: Supervision/Verbal cueing   Blood pressure 114/76, pulse (!) 104, temperature 97.8 F (36.6 C), resp. rate 16, height '5\' 4"'$  (1.626 m), weight 68.3 kg, SpO2 97 %.  Medical Problem List and Plan: 1. 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  Antithrombotics:  -DVT/anticoagulation:  Pharmaceutical: Lovenox-  started 10/30- wasn't in orders               -antiplatelet therapy: ASA '81mg'$  3. Pain Management: Tylenol for mild pain tramadol for moderate to severe pain 4. Mood/Behavior/Sleep: Melatonin '3mg'$    10/30- will try Trazodone 50 mg QHS for sleep             -antipsychotic agents: N/A 5. Neuropsych/cognition: This patient is not ?capable of making decisions on his own behalf. 6. Skin/Wound Care: monitor  order eucerin prn 7. Fluids/Electrolytes/Nutrition: check CMET in am, monitor I/Os, supplement with MVI and VIt C 9.  Thiamine deficiency cont  B1 '250mg'$  qd through 10/31, start '100mg'$  per day on 11/23/21, pt states he was treated for this a couple years ago as well  10/31- per GF of 35 years- is alcoholic and won't stop drinking.  10.  GERD- may be related to ETOH use, cont protonix '40mg'$  per day 11. GOut RIght elbow cont prednisone '20mg'$  per day x 5-7d depending on resolution  10/30- pain is pretty much resolved- if Cultures stay negative (per Ortho) and pain resolved, will stop early 12.  Overactive bladder  Ditropan XL '10mg'$  qhs, monitor for retention   10/30- using condom catheter at night  10/31- actually using at all times- will wean to just at night, then off.    Many social issues- pt insists can leave- per GF is alcoholic and wants to go home to drink- got moderate to severe cognitive deficits on Cognistat- per SLP, thinks near baseline, but pt not safe at home, and is min A for all ADLs/mobility- pt insists on going home- would need to go AMA, since not safe at this time- d/c set for 11/6      I spent a total of  41  minutes on total care today- >50% coordination of care- due to  team conference and additional d/w team  LOS: 2 days A FACE TO FACE EVALUATION WAS PERFORMED  Katrianna Friesenhahn 11/22/2021, 6:44 PM

## 2021-11-23 DIAGNOSIS — I639 Cerebral infarction, unspecified: Secondary | ICD-10-CM | POA: Diagnosis not present

## 2021-11-23 MED ORDER — TRAZODONE HCL 50 MG PO TABS
100.0000 mg | ORAL_TABLET | Freq: Every day | ORAL | Status: DC
  Administered 2021-11-23 – 2021-11-26 (×4): 100 mg via ORAL
  Filled 2021-11-23 (×5): qty 2

## 2021-11-23 MED ORDER — CITALOPRAM HYDROBROMIDE 20 MG PO TABS
20.0000 mg | ORAL_TABLET | Freq: Every day | ORAL | Status: DC
  Administered 2021-11-23 – 2021-11-28 (×6): 20 mg via ORAL
  Filled 2021-11-23 (×6): qty 1

## 2021-11-23 NOTE — Progress Notes (Signed)
PROGRESS NOTE   Subjective/Complaints:   Said didn't sleep since angry cannot leave- refuses to go AMA,- I "MUST" discharge him- family refuses to pick him up- he accused all staff of lying about his need for assistance and safety concerns.    ROS:  Limited by behavior  Objective:   No results found. No results for input(s): "WBC", "HGB", "HCT", "PLT" in the last 72 hours.  No results for input(s): "NA", "K", "CL", "CO2", "GLUCOSE", "BUN", "CREATININE", "CALCIUM" in the last 72 hours.   Intake/Output Summary (Last 24 hours) at 11/23/2021 1955 Last data filed at 11/23/2021 1700 Gross per 24 hour  Intake 710 ml  Output 825 ml  Net -115 ml     Pressure Injury 11/20/21 Buttocks Left Stage 1 -  Intact skin with non-blanchable redness of a localized area usually over a bony prominence. deep red (Active)  11/20/21 1641  Location: Buttocks  Location Orientation: Left (closer to coccyx)  Staging: Stage 1 -  Intact skin with non-blanchable redness of a localized area usually over a bony prominence.  Wound Description (Comments): deep red  Present on Admission: Yes     Pressure Injury 11/20/21 Buttocks Right Stage 2 -  Partial thickness loss of dermis presenting as a shallow open injury with a red, pink wound bed without slough. (Active)  11/20/21 1642  Location: Buttocks  Location Orientation: Right  Staging: Stage 2 -  Partial thickness loss of dermis presenting as a shallow open injury with a red, pink wound bed without slough.  Wound Description (Comments):   Present on Admission: Yes    Physical Exam: Vital Signs Blood pressure 113/63, pulse 92, temperature 98.5 F (36.9 C), temperature source Oral, resp. rate 14, height '5\' 4"'$  (1.626 m), weight 68.3 kg, SpO2 96 %.    General: awake, alert, but confused and irritable; NAD HENT: conjugate gaze; oropharynx moist CV: regular rate; no JVD Pulmonary: CTA B/L; no W/R/R-  good air movement GI: soft, NT, ND, (+)BS Psychiatric: inappropriate, perseverative- insists on leaving and got belligerent when didn't say he could go Neurological: Ox1-2 Musculoskeletal:        General: Tenderness present. No swelling.     Comments: RIght elbow pain with ROM, limited endrange ext, no erythema - no change today Skin:    General: Skin is warm and dry.  Neurological:     Mental Status: He is alert.     Cranial Nerves: No dysarthria or facial asymmetry.     Motor: No tremor or abnormal muscle tone.     Coordination: Coordination abnormal.     Gait: Gait abnormal.     Comments: Motor strength is 4/5 bilateral deltoid 3/5 right tricep 4/5 left tricep 4/5 bilateral grip 4/5 bilateral hip flexor knee extensor ankle dorsiflexor Patient reports equal sensation bilateral hands and feet.   Assessment/Plan: 1. Functional deficits which require 3+ hours per day of interdisciplinary therapy in a comprehensive inpatient rehab setting. Physiatrist is providing close team supervision and 24 hour management of active medical problems listed below. Physiatrist and rehab team continue to assess barriers to discharge/monitor patient progress toward functional and medical goals  Care Tool:  Bathing  Body parts bathed by patient: Right arm, Left arm, Chest, Abdomen, Front perineal area, Face, Right upper leg, Left upper leg, Buttocks, Right lower leg, Left lower leg   Body parts bathed by helper: Right lower leg, Left lower leg, Buttocks     Bathing assist Assist Level: Contact Guard/Touching assist     Upper Body Dressing/Undressing Upper body dressing   What is the patient wearing?: Pull over shirt    Upper body assist Assist Level: Contact Guard/Touching assist    Lower Body Dressing/Undressing Lower body dressing      What is the patient wearing?: Underwear/pull up, Pants     Lower body assist Assist for lower body dressing: Minimal Assistance - Patient > 75%      Toileting Toileting    Toileting assist Assist for toileting: Independent with assistive device Assistive Device Comment: urinal   Transfers Chair/bed transfer  Transfers assist     Chair/bed transfer assist level: Maximal Assistance - Patient 25 - 49%     Locomotion Ambulation   Ambulation assist      Assist level: Contact Guard/Touching assist Assistive device: Walker-rolling Max distance: 165   Walk 10 feet activity   Assist     Assist level: Contact Guard/Touching assist Assistive device: Walker-rolling   Walk 50 feet activity   Assist    Assist level: Contact Guard/Touching assist Assistive device: Walker-rolling    Walk 150 feet activity   Assist Walk 150 feet activity did not occur: Safety/medical concerns (unable to perform due to decreased balance, coordination and activity tolerance)  Assist level: Contact Guard/Touching assist Assistive device: Walker-rolling    Walk 10 feet on uneven surface  activity   Assist     Assist level: Contact Guard/Touching assist (ramp) Assistive device: Walker-rolling   Wheelchair     Assist Is the patient using a wheelchair?: Yes Type of Wheelchair: Manual    Wheelchair assist level: Supervision/Verbal cueing Max wheelchair distance: 150 ft    Wheelchair 50 feet with 2 turns activity    Assist        Assist Level: Supervision/Verbal cueing   Wheelchair 150 feet activity     Assist      Assist Level: Supervision/Verbal cueing   Blood pressure 113/63, pulse 92, temperature 98.5 F (36.9 C), temperature source Oral, resp. rate 14, height '5\' 4"'$  (1.626 m), weight 68.3 kg, SpO2 96 %.  Medical Problem List and Plan: 1. 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  Antithrombotics:  -DVT/anticoagulation:  Pharmaceutical: Lovenox- started 10/30- wasn't in orders               -antiplatelet therapy: ASA '81mg'$  3. Pain Management: Tylenol for mild pain tramadol for moderate to severe pain 4. Mood/Behavior/Sleep: Melatonin '3mg'$    10/30- will try Trazodone 50 mg QHS for sleep  11/1- will increase trazodone to 100 mg QHS and  added Celexa 20 mg daily due to alcoholic behaviors             -antipsychotic agents: N/A 5. Neuropsych/cognition: This patient is not ?capable of making decisions on his own behalf.  11/1- per SLP not capable of making decisions for himself- very adversarial and insists not only that he be discharged, but that GF pick him up- who refuses- wants him to go to nursing home unless stops drinking/stops doing things to make himself fall 6. Skin/Wound Care: monitor  order eucerin prn 7. Fluids/Electrolytes/Nutrition: check CMET in am, monitor I/Os, supplement with MVI and VIt C 9.  Thiamine deficiency cont B1 '250mg'$  qd through 10/31, start '100mg'$  per day on 11/23/21, pt states he was treated for this a couple years ago as well  10/31- per GF of 35 years- is alcoholic and won't stop drinking.  10.  GERD- may be related to ETOH use, cont protonix '40mg'$  per day 11. GOut RIght elbow cont prednisone '20mg'$  per day x 5-7d depending on resolution  10/30- pain is pretty much resolved- if Cultures stay negative (per Ortho) and pain resolved, will stop early 12.  Overactive bladder  Ditropan XL '10mg'$  qhs, monitor for retention   10/30- using condom catheter at night  10/31- actually using at all times- will wean to just at night, then off.   11.1- trying to wean off   Many social issues- pt  insists can leave- per GF is alcoholic and wants to go home to drink- got moderate to severe cognitive deficits on Cognistat- per SLP, thinks near baseline, but pt not safe at home, and is min A for all ADLs/mobility- pt insists on going home- would need to go AMA, since not safe at this time- d/c set for 11/6      I spent a total of  62  minutes on total care today- >50% coordination of care- due to calling GF Webb Silversmith and Son [name] and doing IPOC and prolonged time with pt due to belligerent behavior Family refuse to take him early. He threatened to call cab- I explained that's his right at this time, but his family has refused to take him home early.   LOS: 3 days A FACE TO FACE EVALUATION WAS PERFORMED  Jose [name] 11/23/2021, 7:55 PM

## 2021-11-23 NOTE — Evaluation (Signed)
Recreational Therapy Assessment and Plan  Patient Details  Name: Jose [name] MRN: [id number] Date of Birth: 01-16-1936 Today's Date: 11/23/2021  Rehab Potential:  Fair ELOS:   d/c 11/6  Assessment   Hospital Problem: Principal Problem:   Subcortical infarction [facility]) Active Problems:   Pressure injury of skin     Past Medical History:      Past Medical History:  Diagnosis Date   Acute meniscal tear of knee      RIGHT KNEE   Arthritis     Hives      PER PT UNKNOWN WHY   IBS (irritable bowel syndrome)     Mild sleep apnea      PER STUDY  11-30-2010--  NO CPAP   Neuromuscular disorder ([place])     Spinal stenosis      Past Surgical History:       Past Surgical History:  Procedure Laterality Date   APPENDECTOMY       KNEE ARTHROSCOPY WITH LATERAL MENISECTOMY Right 04/12/2012    Procedure: RIGHT KNEE ARTHROSCOPY WITH PARTIAL MEDIAL AND LATERAL MENISECTOMY;  Surgeon: Magnus Sinning, MD;  Location: [place];  Service: Orthopedics;  Laterality: Right;   SHOULDER ARTHROSCOPY WITH ROTATOR CUFF REPAIR AND SUBACROMIAL DECOMPRESSION   12-21-1999  &  01-28-2004      Assessment & Plan Clinical Impression: Patient is a 86 y.o. year old male  with medical history significant of alcohol use, GERD, thiamine deficiency, ataxia who presented to the [facility] ED with ongoing generalized pain.  With additional information from friend EDP was able to determine the patient had stopped drinking 4 to 5 days ago.  Also that he has years of issues with his balance and weakness however he has had significant increase in his confusion and weakness in the last 4 to 5 days (patient initially stated he did not know when he quit in the ED, on my exam he states that his girlfriend is making it up and that it was 4 to 5 weeks ago).Vital signs in the ED significant for heart rate in the 40s to 120s, blood pressure in the 741U to 384 systolic, respiratory rate in the 20s to 30s.  Episode  of hypoxia documented however this has not been persistent.  Lab work-up included CMP with glucose 126, T. bili 1.3.  CBC with leukocytosis to 14.7.  Lipase normal.  Urinalysis pending.  Ethanol level negative.  Ammonia level negative.  Pending labs include B1, B12, folate, TSH.  CT head showed new focal hypodense lesion.  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  Neurology was consulted and recommended stroke work-up as well as administration of thiamine and further encephalopathy work-up.  Patient received thiamine and IV fluids in the ED. No signs of ETOH withdrawal this admission. Echocardiogram shows normal systolic function. EEG did not show any epileptiform activity. Pt. Also with swelling of the right elbow area around with some erythema and warmth noted.  X-ray was done which did not show any obvious fractures.    Plain films did not show any acute abnormalities.  CT of the elbow was subsequently done which showed joint effusion and soft tissue swelling.  Uric acid level was normal at 4.5.  ESR and CRP was significantly high.  WBC was noted to be high as well.Orthopedics was subsequently consulted.  Patient underwent joint aspiration on 10/26.  Gram stain does not show any organisms.  He does have intracellular monosodium urate crystals.  Synovial fluid more consistent with acute gout and inflammatory arthritis rather than infectious arthritis.  Cultures without any growth so far. Started on doxycycline. Per ortho, no plan for surgery at this time PT, SLP,  and OT saw Pt. And recommended CIR to assist return to PLOF.  Patient transferred to CIR on 11/20/2021 .    Pt presents with decreased activity tolerance, decreased functional mobility, decreased balance, decreased coordination, feelings of stress Limiting pt's independence with leisure/community pursuits.   Met with pt today to discuss TR services including leisure education, activity analysis/modifications and stress management.   Also discussed the importance of social, emotional, spiritual health in addition to physical health and their effects on overall health and wellness.  Pt stated understanding.  Pt frustrated with hospitalization stating he could do what he is doing here at home.  Pt wants to discharge prior to scheduled discharge date.  Plan  No further TR as pt is focused on doing what he needs to do to get home asap  Recommendations for other services: None   Discharge Criteria: Patient will be discharged from TR if patient refuses treatment 3 consecutive times without medical reason.  If treatment goals not met, if there is a change in medical status, if patient makes no progress towards goals or if patient is discharged from hospital.  The above assessment, treatment plan, treatment alternatives and goals were discussed and mutually agreed upon: by patient  [place] 11/23/2021, 3:48 PM

## 2021-11-23 NOTE — Progress Notes (Signed)
Occupational Therapy Session Note  Patient Details  Name: Jose [name] MRN: [id number] Date of Birth: 02/24/1935  Today's Date: 11/23/2021 OT Individual Time: 1st Session 1000-1100, 2nd Session  1115-5208 OT Individual Time Calculation (min): 60 min, 49 min    Short Term Goals: Week 1:  OT Short Term Goal 1 (Week 1): pt will stand pivot wc to toilet with close S. OT Short Term Goal 2 (Week 1): Pt will be able to ambulate to toilet with RW with min A. OT Short Term Goal 3 (Week 1): Pt will demonstrate improved standing balance to be able to pull pants over hips with CGA.  Skilled Therapeutic Interventions/Progress Updates:  1st Session:  Pt agreeable to all OT activity presented this am however somewhat focussed still on discharge date. Pt's significant other arrived and both had argument re: need to stay in rehab to get stronger and revealed pt had been having bouts of incontinence and times where she would find him on the floor in his home in urine and she could not manage that. Pt agreed to participate fully and she left pt to work with OT. OT facilitated supine to sit at EOB with increased time for pt to organize movements with S and min cues. Once at EOB, pt able to amb to standard chair set sink side with CGA 15 ft and stood for oral care ~ 4 minutes with CGA. Seated rest briefly and then stood for 5 min x 4 intervals for shaving activity with close s only and set up. UB sponge bathing and pullover shirt dressing with S seated, and CGA for LB sponge bathing and dressing with seated and RW standing intervals. Pt reported he would enjoy going off the until with OT later session to work on more functional mobility. Pt left bed level as per request with bed exit alarm engaged and needs and call button in reach.   2nd Session:  Pt in bed upon OT arrival. Eager for session as OT discussed previous session we would access the 1st floor to perform community integration and functional  mobility off the unit. Pt transferred supine to EOB to RW level standing with S and min cues only. Transferred to w/c via SPT with min cues and close S for hand placement. OT transported pt to 1st floor for energy conservation. Pt able to amb with RW 45 ft x 2 trials to regular atrium arm chair with CGA and min cues. Pt then interested in seeing the [facility] History display as he reports his grandmother was Driscilla Moats pictured in the historical representation. Pt able to stand at the display for up to 4 min with Rw support and close s to discuss his family connection and point out his cousins in the photos. Pt educated on pacing and seated rest needs. Once back in room, pt left bed level as per request with bed exit alarm engaged and needs and call button in reach.     Therapy Documentation Precautions:  Precautions Precautions: Fall Precaution Comments: permissive HTN, Rt shoulder and elbow pain Restrictions Weight Bearing Restrictions: No    Therapy/Group: Individual Therapy  Barnabas Lister 11/23/2021, 7:54 AM

## 2021-11-23 NOTE — Progress Notes (Signed)
Physical Therapy Session Note  Patient Details  Name: Jose [name] MRN: [id number] Date of Birth: Jul 04, 1935  Today's Date: 11/23/2021 PT Individual Time: 0805-0902 PT Individual Time Calculation (min): 57 min   Short Term Goals: Week 1:  PT Short Term Goal 1 (Week 1): Pt will perform sit to stand with min A with LRAD PT Short Term Goal 2 (Week 1): Pt will perform bed mobility CGA or less PT Short Term Goal 3 (Week 1): Pt will ambulate 15 ft CGA or less with LRAD  Skilled Therapeutic Interventions/Progress Updates:      Therapy Documentation Precautions:  Precautions Precautions: Fall Precaution Comments: permissive HTN, Rt shoulder and elbow pain Restrictions Weight Bearing Restrictions: No  Pt received semi-reclined in bed with MD present. Pt participated in PT session with emphasis on gait and neuromuscular re-education. Pt stated " I can do what I do here at home", PT provided education on benefits of continuation of therapy at CIR due to mobility deficits. Pt minimally receptive to education.  Pt requires supervision with supine to sit and CGA with sit to stand and gait of ~150 ft with RW to main gym. Pt requires CGA/min A for static standing balance on airex pad in normal and narrow stance. Pt able to tolerate position ~30 seconds for each bout.  Pt performed Berg balance assessment and demonstrates increased fall risk as noted by score of   32/56 on Berg Balance Scale indicating pt is at at high risk for falls. .  (<36= high risk for falls, close to 100%; 37-45 significant >80%; 46-51 moderate >50%; 52-55 lower >25%). Pt ambulated to room and requested need to toilet. Pt left in care of NT on toilet.      Balance: Balance Balance Assessed: Yes Standardized Balance Assessment Standardized Balance Assessment: Berg Balance Test Berg Balance Test Sit to Stand: Able to stand without using hands and stabilize independently Standing Unsupported: Able to stand 2 minutes with  supervision Sitting with Back Unsupported but Feet Supported on Floor or Stool: Able to sit safely and securely 2 minutes Stand to Sit: Sits safely with minimal use of hands Transfers: Able to transfer safely, definite need of hands Standing Unsupported with Eyes Closed: Able to stand 10 seconds with supervision Standing Ubsupported with Feet Together: Needs help to attain position but able to stand for 30 seconds with feet together From Standing, Reach Forward with Outstretched Arm: Can reach forward >12 cm safely (5") From Standing Position, Pick up Object from Floor: Able to pick up shoe, needs supervision From Standing Position, Turn to Look Behind Over each Shoulder: Looks behind one side only/other side shows less weight shift Turn 360 Degrees: Needs close supervision or verbal cueing Standing Unsupported, Alternately Place Feet on Step/Stool: Needs assistance to keep from falling or unable to try Standing Unsupported, One Foot in Front: Loses balance while stepping or standing Standing on One Leg: Unable to try or needs assist to prevent fall Total Score: 32     Therapy/Group: Individual Therapy  Verl Dicker Verl Dicker PT, DPT  11/23/2021, 9:39 AM

## 2021-11-23 NOTE — Plan of Care (Signed)
Orthopedic Plan of Care  Aspirate cultures for both anaerobic and aerobic has finalized as no growth to date. No operative plan during this admission. Can continue to treat for gout. Okay for diet and dvt ppx from ortho perspective. Can be weight bearing and activity as tolerated on the right arm. Follow up with me in the office on an as needed basis.   Callie Fielding, MD Orthopedic Surgeon

## 2021-11-23 NOTE — Progress Notes (Signed)
Speech Language Pathology Daily Session Note  Patient Details  Name: Jose [name] MRN: [id number] Date of Birth: 03/30/1935  Today's Date: 11/23/2021 SLP Individual Time: 1233-1300 SLP Individual Time Calculation (min): 27 min  Short Term Goals: Week 1: SLP Short Term Goal 1 (Week 1): Pt will demonstrate recall of daily information with use of external and internal aids with mod A verbal cues. SLP Short Term Goal 2 (Week 1): Pt will demonstrated mildly complex problem solving with supervision A verbal cues. SLP Short Term Goal 3 (Week 1): Pt will demonstrate intellectual awarenes and impact of deficits on functional abilities with mod A verbal cues. SLP Short Term Goal 4 (Week 1): Pt will demonstrate selective attention due to external and internal distractions with supervision A verbal cues in 20 minute intervals.  Skilled Therapeutic Interventions:Skilled ST services focused on cognitive skills. Pt demonstrated no recall of current medication and support can recall home medication, but deflected and could not verbalize medications. SLP facilitated education of current medication, creating list. Pt demonstrated verbal problem solving of BID medication mod I, but supported noncompliance when d/c. SLP provided education of need to continue medication to prevent a stroke from occurring, pt minimalized situation. Pt did not participate in recall of current medication "ive got it." Pt was left in room with call bell within reach and bed alarm set. SLP recommends to continue skilled services.     Pain Pain Assessment Pain Score: 0-No pain  Therapy/Group: Individual Therapy  Jose [name]  [facility] 11/23/2021, 1:43 PM

## 2021-11-23 NOTE — IPOC Note (Signed)
Overall Plan of Care [facility]) Patient Details Name: Jose [name] MRN: [id number] DOB: [birth year]  Admitting Diagnosis: Subcortical infarction [facility])  Hospital Problems: Principal Problem:   Subcortical infarction [facility]) Active Problems:   Pressure injury of skin     Functional Problem List: Nursing Pain, Bladder, Bowel, Safety, Sensory, Endurance, Skin Integrity, Medication Management, Motor  PT Balance, Behavior, Endurance, Motor, Safety  OT Balance, Cognition, Endurance, Motor, Perception, Safety  SLP Cognition  TR         Basic ADL's: OT Bathing, Dressing, Toileting     Advanced  ADL's: OT       Transfers: PT Bed Mobility, Bed to Chair, Teacher, early years/pre, Tub/Shower     Locomotion: PT Ambulation, Wheelchair Mobility     Additional Impairments: OT None  SLP Social Cognition   Awareness, Attention, Problem Solving, Memory  TR      Anticipated Outcomes Item Anticipated Outcome  Self Feeding independent  Swallowing      Basic self-care  supervision  Toileting  supervision   Bathroom Transfers supervision  Bowel/Bladder  continent x 2  Transfers  Designer, multimedia  Supervision A  Pain  less than 4  Safety/Judgment  remain fall lfree while in rehab   Therapy Plan: PT Intensity: Minimum of 1-2 x/day ,45 to 90 minutes PT Frequency: 5 out of 7 days PT Duration Estimated Length of Stay: 2-3 weeks OT Intensity: Minimum of 1-2 x/day, 45 to 90 minutes OT Frequency: 5 out of 7 days OT Duration/Estimated Length of Stay: 2-3 weeks SLP Intensity: Minumum of 1-2 x/day, 30 to 90 minutes SLP Frequency: 3 to 5 out of 7 days SLP Duration/Estimated Length of Stay: 2-3 weeks   Team Interventions: Nursing Interventions Patient/Family Education, Disease Management/Prevention, Skin Care/Wound Management, Discharge Planning, Bladder Management, Pain Management, Cognitive Remediation/Compensation, Bowel Management,  Medication Management  PT interventions Ambulation/gait training, Discharge planning, Functional mobility training, Psychosocial support, Therapeutic Activities, Balance/vestibular training, Disease management/prevention, Neuromuscular re-education, Therapeutic Exercise, Wheelchair propulsion/positioning, Cognitive remediation/compensation, DME/adaptive equipment instruction, Pain management, UE/LE Strength taining/ROM, Community reintegration, Barrister's clerk education, IT trainer, UE/LE Coordination activities  OT Interventions Training and development officer, Cognitive remediation/compensation, Discharge planning, DME/adaptive equipment instruction, Functional mobility training, Psychosocial support, Patient/family education, Self Care/advanced ADL retraining, Therapeutic Activities, UE/LE Strength taining/ROM, Therapeutic Exercise, UE/LE Coordination activities, Visual/perceptual remediation/compensation  SLP Interventions Cognitive remediation/compensation, Internal/external aids, Cueing hierarchy, Multimodal communication approach, Functional tasks, Patient/family education, Medication managment  TR Interventions    SW/CM Interventions Discharge Planning, Psychosocial Support, Patient/Family Education   Barriers to Discharge MD  Medical stability, Home enviroment access/loayout, Incontinence, Lack of/limited family support, Weight, Medication compliance, and Behavior  Nursing Home environment access/layout, Incontinence, Wound Care, Lack of/limited family support, Weight main level B/B with elevator  PT Decreased caregiver support, Home environment access/layout, Behavior pt lives alone with intermittent support from male friend, 2 level house with entry to main level and 12 steps to access basement  OT      SLP      SW Decreased caregiver support, Lack of/limited family support, Behavior     Team Discharge Planning: Destination: PT-Home ,OT- Home , SLP-Home Projected Follow-up: PT- ,  OT-  Home health OT, SLP-24 hour supervision/assistance, Outpatient SLP Projected Equipment Needs: PT-To be determined, OT- To be determined, SLP-None recommended by SLP Equipment Details: PT-owns RW, OT-  Patient/family involved in discharge planning: PT- Patient,  OT-Patient, SLP-Patient, Family member/caregiver  MD ELOS: ~ 7-10 days Medical Rehab Prognosis:  Guarded Assessment: The patient has been admitted for CIR therapies with the diagnosis of subcortical stroke. The team will be addressing functional mobility, strength, stamina, balance, safety, adaptive techniques and equipment, self-care, bowel and bladder mgt, patient and caregiver education, . Goals have been set at supervision. Anticipated discharge destination is home, hopefully- pt's behavior is main limiter in progress.        See Team Conference Notes for weekly updates to the plan of care

## 2021-11-24 NOTE — Progress Notes (Signed)
Patient ID: [name], male   DOB: 21-Mar-1935, 86 y.o.   MRN: [id number]  Spoke with [name]-significant other to discuss team's recommendation of 24/7 supervision level at discharge. She reports she can stay with him and see how he does at home and then can decide whether to hire more assist. She feels he would do better with someone he knows over a stranger from an agency. Also discussed follow up and equipment needs. He has a rolling walker and she will get a tub seat on [place]. No preference regarding follow up and will give her number due to pt would probably tell them it is not needed. She asked of a antidepressant was started and the MD has and also spoke with him regarding his ETOH. Will continue to work on discharge needs.

## 2021-11-24 NOTE — Progress Notes (Addendum)
Speech Language Pathology Daily Session Note  Patient Details  Name: Jose [name] MRN: [id number] Date of Birth: 1935/04/22  Today's Date: 11/24/2021 SLP Individual Time: 0950-1030 SLP Individual Time Calculation (min): 40 min  Short Term Goals: Week 1: SLP Short Term Goal 1 (Week 1): Pt will demonstrate recall of daily information with use of external and internal aids with mod A verbal cues. - Mod A verbal cues to answer recall questions re: current medications with medication list present. Recalled upcoming visitor with Sup A. Max A for recalling question to ask MD.  SLP Short Term Goal 2 (Week 1): Pt will demonstrated mildly complex problem solving with supervision A verbal cues.-  Max A faded to Sup A to complete BID pill organizer with 2 current medications.  SLP Short Term Goal 3 (Week 1): Pt will demonstrate intellectual awarenes and impact of deficits on functional abilities with mod A verbal cues. - Pt required Sup A to demonstrate intellectual awareness; Max A for emergent awareness.  SLP Short Term Goal 4 (Week 1): Pt will demonstrate selective attention due to external and internal distractions with supervision A verbal cues in 20 minute intervals. - Min-Mod A verbal and visual cues to demonstrate selective attention for 20 minutes during functional medication management task.   Skilled Therapeutic Interventions: S: Pt seen this date for skilled ST intervention targeting cognitive goals outlined above. Pt received awake/alert and lying in semi-reclined in bed. Significant other present at the beginning of session, though left shortly after session began. Agreeable to ST intervention at bedside, per pt request. Participatory with encouragement.   O: Please see above for objective data re: pt's performance on targeted goals. SLP provided skilled pt education re: ST POC, strategies for completing pill organizer task to ensure safety, rationale for utilizing pill organizer upon  d/c, and compensatory executive functioning + attention strategies. Pt verbalized understanding though required max A for demonstrating understanding. Will need reinforcement.  A: Pt appears somewhat stimulable for skilled ST intervention. Recommend full assistance and supervision with all medication management tasks at time of d/c from IPR; pt stated "OK" and that his significant other could help with this task.  P: Pt left in bed with all safety measures activated. Call bell reviewed and within reach and all immediate needs met. Continue per current ST POC next session.   Pain None/Denies; NAD  Therapy/Group: Individual Therapy  Viktorya Arguijo A Teagan Ozawa 11/24/2021, 1:21 PM

## 2021-11-24 NOTE — Progress Notes (Signed)
Occupational Therapy Session Note  Patient Details  Name: Jose [name] MRN: [id number] Date of Birth: March 10, 1935  Today's Date: 11/24/2021 OT Individual Time: 0800-0900 OT Individual Time Calculation (min): 60 min    Short Term Goals: Week 1:  OT Short Term Goal 1 (Week 1): pt will stand pivot wc to toilet with close S. OT Short Term Goal 2 (Week 1): Pt will be able to ambulate to toilet with RW with min A. OT Short Term Goal 3 (Week 1): Pt will demonstrate improved standing balance to be able to pull pants over hips with CGA.  Skilled Therapeutic Interventions/Progress Updates:   Pt resting in bed upon OT arrival this am. No concerns or complaints of pain or issues. Pt agreeable to ful AM self care retraining session with focus on progressing toward mod I level in preparation for discharge early next week. Pt moved from supine to/from sit with S only. EOB, sit to and from stand to RW with S and min cues for consistency with hand placement. Amb to/from bedside to bathroom with RW with close S and used RW over toilet to stand for voiding. Transferred in/out of stall shower to seat with close S. Pt able to complete full body bathing and hair washing seated with brief intermittent standing with S using LH sponge for feet and back. Amb to regular chair set up in front of sink for UB/LB dressing all pull on clothing with close S. OT assessed skin integrity of buttocks and sacral region with removal of wet sacral pad and replaced with dry. No open areas noted but educated pt on pressure reliefs and need to change positions bed level with + understanding.  Stood for 4 minutes with close S for oral and hair care. Pt reported some minor fatigue post routine completion with HR increased from 88 bpm to 112 bpm back down to 92 bpm once resting back in bed. Left pt bed level with alarm engaged, needs and call button within reach.   Therapy Documentation Precautions:  Precautions Precautions:  Fall Precaution Comments: permissive HTN, Rt shoulder and elbow pain Restrictions Weight Bearing Restrictions: No    Therapy/Group: Individual Therapy  Barnabas Lister 11/24/2021, 7:42 AM

## 2021-11-24 NOTE — Progress Notes (Signed)
Physical Therapy Session Note  Patient Details  Name: Jose [name] MRN: [id number] Date of Birth: 05/21/1935  Today's Date: 11/24/2021 PT Individual Time: 1535-1620 PT Individual Time Calculation (min): 45 min   Short Term Goals: Week 1:  PT Short Term Goal 1 (Week 1): Pt will perform sit to stand with min A with LRAD PT Short Term Goal 2 (Week 1): Pt will perform bed mobility CGA or less PT Short Term Goal 3 (Week 1): Pt will ambulate 15 ft CGA or less with LRAD   Skilled Therapeutic Interventions/Progress Updates:   Pt received supine in bed and agreeable to PT. Supine>sit transfer with supevrision assist. Sit<>stand transfers with RW and supervision assist throughout session. Gait training with RW through hall 2 x 180ft with cues for direction and safety in turns. No LOB. Gait training with no AD x 150ft with min assist throughout. Cues for posture, step length and reciprocal pattern with noted trendelenberg on the LLE. .   Seated hip abduction with level 3 tband 3 x 10 BLE. Standing NMR side stepping R and L with tband 3 x 8ft in parallel bars. hip extension with tband 3 x 10 bLE with tband .   Dynamic gait training side stepping with crossed feet in parallel bars 3 x 8ft with min assist and cues for sequencing.   Pt returned to room and performed ambulatory transfer to bed with RW and supervision assist. Sit>supine completed with supervision assist for safety, and left supine in bed with call bell in reach and all needs met.       Therapy Documentation Precautions:  Precautions Precautions: Fall Precaution Comments: permissive HTN, Rt shoulder and elbow pain Restrictions Weight Bearing Restrictions: No  Vital Signs: Therapy Vitals Temp: 98.4 F (36.9 C) Temp Source: Oral Pulse Rate: 95 Resp: 16 BP: 121/66 Patient Position (if appropriate): Sitting Oxygen Therapy SpO2: 95 % O2 Device: Room Air Pain: denies   Therapy/Group: Individual Therapy   E   11/24/2021, 4:22 PM  

## 2021-11-24 NOTE — Progress Notes (Signed)
Physical Therapy Session Note  Patient Details  Name: Jose [name] MRN: [id number] Date of Birth: 1935/09/12  Today's Date: 11/24/2021 PT Individual Time: 5188-4166 PT Individual Time Calculation (min): 58 min   Short Term Goals: Week 1:  PT Short Term Goal 1 (Week 1): Pt will perform sit to stand with min A with LRAD PT Short Term Goal 2 (Week 1): Pt will perform bed mobility CGA or less PT Short Term Goal 3 (Week 1): Pt will ambulate 15 ft CGA or less with LRAD  Skilled Therapeutic Interventions/Progress Updates:      Therapy Documentation Precautions:  Precautions Precautions: Fall Precaution Comments: permissive HTN, Rt shoulder and elbow pain Restrictions Weight Bearing Restrictions: No  Pt received semi-reclined in bed, agreeable to PT session and declines pain. Pt requires supervision for safety and increased time for bed mobility and SBA for sit to stand transfer with RW. Pt requires verbal cues for hand placement with transfer. Pt ambulated SBA with RW ~150 ft to main gym and provided with seated rest break and cup of coffee as pt reports he is sleepy. Pt performed alternating LE cone taps in standing CGA with bilateral UE support on RW to improve balance/coordination. Pt transitioned to gait training without an AD 40 ft x 2 and requires CGA/min A for balance as pt presents with wide base of support and shuffle gait pattern. PT provided anterior bilateral handheld assist with gait to facilitate reciprocal arm swing. Pt requires CGA for dynamic reactive standing balance with ball toss to rebounder. Pt also performed ball dribbling with bilateral UE and progressed to 1 UE. Pt ambulated to room SBA and left semi-reclined in bed with all needs in reach.    Therapy/Group: Individual Therapy  Verl Dicker Verl Dicker PT, DPT  11/24/2021, 7:46 AM

## 2021-11-25 NOTE — Progress Notes (Signed)
PROGRESS NOTE   Subjective/Complaints:  Pt reports he thought he was told he got to leave- Had to explain he's scheduled for 11/6 and cannot leave without going AMA prior to 11.6- per therapy, is working with them better   ROS: Limited by cognition/behavior  Objective:   No results found. No results for input(s): "WBC", "HGB", "HCT", "PLT" in the last 72 hours.  No results for input(s): "NA", "K", "CL", "CO2", "GLUCOSE", "BUN", "CREATININE", "CALCIUM" in the last 72 hours.   Intake/Output Summary (Last 24 hours) at 11/25/2021 2563 Last data filed at 11/25/2021 0900 Gross per 24 hour  Intake 830 ml  Output 2200 ml  Net -1370 ml     Pressure Injury 11/20/21 Buttocks Left Stage 1 -  Intact skin with non-blanchable redness of a localized area usually over a bony prominence. deep red (Active)  11/20/21 1641  Location: Buttocks  Location Orientation: Left (closer to coccyx)  Staging: Stage 1 -  Intact skin with non-blanchable redness of a localized area usually over a bony prominence.  Wound Description (Comments): deep red  Present on Admission: Yes     Pressure Injury 11/20/21 Buttocks Right Stage 2 -  Partial thickness loss of dermis presenting as a shallow open injury with a red, pink wound bed without slough. (Active)  11/20/21 1642  Location: Buttocks  Location Orientation: Right  Staging: Stage 2 -  Partial thickness loss of dermis presenting as a shallow open injury with a red, pink wound bed without slough.  Wound Description (Comments):   Present on Admission: Yes    Physical Exam: Vital Signs Blood pressure 119/77, pulse 98, temperature 97.9 F (36.6 C), temperature source Oral, resp. rate 18, height '5\' 4"'$  (1.626 m), weight 68.3 kg, SpO2 98 %.     General: awake, alert, perseverating on leaving today; NAD HENT: conjugate gaze; oropharynx moist CV: regular rate; no JVD Pulmonary: CTA B/L; no W/R/R- good  air movement GI: soft, NT, ND, (+)BS Psychiatric: inappropriate, but less belligerent Neurological: more alert, but sleepy Musculoskeletal:        General: Tenderness present. No swelling.     Comments: RIght elbow pain with ROM, limited endrange ext, no erythema - no change today Skin:    General: Skin is warm and dry.  Neurological:     Mental Status: He is alert.     Cranial Nerves: No dysarthria or facial asymmetry.     Motor: No tremor or abnormal muscle tone.     Coordination: Coordination abnormal.     Gait: Gait abnormal.     Comments: Motor strength is 4/5 bilateral deltoid 3/5 right tricep 4/5 left tricep 4/5 bilateral grip 4/5 bilateral hip flexor knee extensor ankle dorsiflexor Patient reports equal sensation bilateral hands and feet.   Assessment/Plan: 1. Functional deficits which require 3+ hours per day of interdisciplinary therapy in a comprehensive inpatient rehab setting. Physiatrist is providing close team supervision and 24 hour management of active medical problems listed below. Physiatrist and rehab team continue to assess barriers to discharge/monitor patient progress toward functional and medical goals  Care Tool:  Bathing    Body parts bathed by patient: Right arm, Left arm, Chest,  Abdomen, Front perineal area, Face, Right upper leg, Left upper leg, Buttocks, Right lower leg, Left lower leg   Body parts bathed by helper: Right lower leg, Left lower leg, Buttocks     Bathing assist Assist Level: Supervision/Verbal cueing     Upper Body Dressing/Undressing Upper body dressing   What is the patient wearing?: Pull over shirt    Upper body assist Assist Level: Independent    Lower Body Dressing/Undressing Lower body dressing      What is the patient wearing?: Underwear/pull up, Pants     Lower body assist Assist for lower body dressing: Supervision/Verbal cueing     Toileting Toileting    Toileting assist Assist for toileting: Independent with  assistive device Assistive Device Comment: urinal   Transfers Chair/bed transfer  Transfers assist     Chair/bed transfer assist level: Maximal Assistance - Patient 25 - 49%     Locomotion Ambulation   Ambulation assist      Assist level: Contact Guard/Touching assist Assistive device: Walker-rolling Max distance: 165   Walk 10 feet activity   Assist     Assist level: Contact Guard/Touching assist Assistive device: Walker-rolling   Walk 50 feet activity   Assist    Assist level: Contact Guard/Touching assist Assistive device: Walker-rolling    Walk 150 feet activity   Assist Walk 150 feet activity did not occur: Safety/medical concerns (unable to perform due to decreased balance, coordination and activity tolerance)  Assist level: Contact Guard/Touching assist Assistive device: Walker-rolling    Walk 10 feet on uneven surface  activity   Assist     Assist level: Contact Guard/Touching assist (ramp) Assistive device: Walker-rolling   Wheelchair     Assist Is the patient using a wheelchair?: Yes Type of Wheelchair: Manual    Wheelchair assist level: Supervision/Verbal cueing Max wheelchair distance: 150 ft    Wheelchair 50 feet with 2 turns activity    Assist        Assist Level: Supervision/Verbal cueing   Wheelchair 150 feet activity     Assist      Assist Level: Supervision/Verbal cueing   Blood pressure 119/77, pulse 98, temperature 97.9 F (36.6 C), temperature source Oral, resp. rate 18, height '5\' 4"'$  (1.626 m), weight 68.3 kg, SpO2 98 %.  Medical Problem List and Plan: 1. Functional deficits secondary to right corona radiata infarct             -patient may  shower             -ELOS/Goals: 5 to 7 days with supervision goals  Con't CIR_ PT, OT and SLP  Team conference today- to determine length of stay and determined has significant cognitive deficits- if leaves early, would need to go AMA- d/c date set for  11/6  Con't CIR- PT, OT and SLP- refused 2/3 therapies yesterday- pt says he did all therapy and they are "lying"- I advised him he will have to go AMA if leaving earlier than 11/6  Con't CIR- PT, OT and SLP 2.  Antithrombotics:  -DVT/anticoagulation:  Pharmaceutical: Lovenox- started 10/30- wasn't in orders               -antiplatelet therapy: ASA '81mg'$  3. Pain Management: Tylenol for mild pain tramadol for moderate to severe pain 4. Mood/Behavior/Sleep: Melatonin '3mg'$    10/30- will try Trazodone 50 mg QHS for sleep  11/1- will increase trazodone to 100 mg QHS and added Celexa 20 mg daily due to alcoholic behaviors             -  antipsychotic agents: N/A 5. Neuropsych/cognition: This patient is not ?capable of making decisions on his own behalf.  11/1- per SLP not capable of making decisions for himself- very adversarial and insists not only that he be discharged, but that GF pick him up- who refuses- wants him to go to nursing home unless stops drinking/stops doing things to make himself fall 6. Skin/Wound Care: monitor  order eucerin prn 7. Fluids/Electrolytes/Nutrition: check CMET in am, monitor I/Os, supplement with MVI and VIt C 9.  Thiamine deficiency cont B1 '250mg'$  qd through 10/31, start '100mg'$  per day on 11/23/21, pt states he was treated for this a couple years ago as well  10/31- per GF of 35 years- is alcoholic and won't stop drinking.  10.  GERD- may be related to ETOH use, cont protonix '40mg'$  per day 11. GOut RIght elbow cont prednisone '20mg'$  per day x 5-7d depending on resolution  10/30- pain is pretty much resolved- if Cultures stay negative (per Ortho) and pain resolved, will stop early 12.  Overactive bladder  Ditropan XL '10mg'$  qhs, monitor for retention   10/30- using condom catheter at night  10/31- actually using at all times- will wean to just at night, then off.   11.1- trying to wean off   Many social issues- pt insists can leave- per GF is alcoholic and wants to go home to  drink- got moderate to severe cognitive deficits on Cognistat- per SLP, thinks near baseline, but pt not safe at home, and is min A for all ADLs/mobility- pt insists on going home- would need to go AMA, since not safe at this time- d/c set for 11/6       LOS: 5 days A FACE TO FACE EVALUATION WAS PERFORMED  Tyarra Nolton 11/25/2021, 9:22 AM

## 2021-11-25 NOTE — Progress Notes (Signed)
Occupational Therapy Discharge Summary  Patient Details  Name: Jose [name] MRN: [id number] Date of Birth: 1935/07/06  Date of Discharge from OT service:{Time; dates multiple:304500300}  {CHL IP REHAB OT TIME CALCULATIONS:304400400}   Patient has met {NUMBERS 0-12:18577} of {NUMBERS 0-12:18577} long term goals due to {due SK:8138871}.  Patient to discharge at overall {LOA:3049010} level.  Patient's care partner {care partner:3041650} to provide the necessary {assistance:3041652} assistance at discharge.    Reasons goals not met: ***  Recommendation:  Patient will benefit from ongoing skilled OT services in {setting:3041680} to continue to advance functional skills in the area of {ADL/iADL:3041649}.  Equipment: {equipment:3041657}  Reasons for discharge: {Reason for discharge:3049018}  Patient/family agrees with progress made and goals achieved: {Pt/Family agree with progress/goals:3049020}  OT Discharge Precautions/Restrictions    General   Vital Signs Therapy Vitals Temp: 97.9 F (36.6 C) Temp Source: Oral Pulse Rate: 98 Resp: 18 BP: 119/77 Patient Position (if appropriate): Lying Oxygen Therapy SpO2: 98 % O2 Device: Room Air Pain   ADL ADL Eating: Set up Grooming: Setup Upper Body Bathing: Setup Lower Body Bathing: Moderate assistance Upper Body Dressing: Minimal assistance Lower Body Dressing: Maximal assistance Toileting: Maximal assistance Toilet Transfer: Minimal assistance Toilet Transfer Method: Stand pivot Toilet Transfer Equipment: Raised toilet seat, Grab bars Vision   Perception    Praxis   Cognition   Sensation   Motor    Mobility     Trunk/Postural Assessment     Balance   Extremity/Trunk Assessment       Barnabas Lister 11/25/2021, 7:27 AM

## 2021-11-25 NOTE — Progress Notes (Signed)
Occupational Therapy Session Note  Patient Details  Name: Jose [name] MRN: [id number] Date of Birth: [birth year]  Today's Date: 11/25/2021 OT Individual Time: 800-915 1st Session; 1300-1415 2nd Session  OT Individual Time Calculation (min): 75 min, 75 min    Short Term Goals: Week 1:  OT Short Term Goal 1 (Week 1): pt will stand pivot wc to toilet with close S. OT Short Term Goal 2 (Week 1): Pt will be able to ambulate to toilet with RW with min A. OT Short Term Goal 3 (Week 1): Pt will demonstrate improved standing balance to be able to pull pants over hips with CGA.  Skilled Therapeutic Interventions/Progress Updates:   1st Session Pt seen for  Focus on full AM self care routine and shower training with emphasis on activity tolerance, energy conservation and dynamic balance. Safety and sequencing for ADL set up with RW conducted as well. Pt moving toward relative mod I with BADL's except S for shower safety due to higher risk slip and fall from wet surfaces with use of LH sponge to reach feet and back. Pt continues to demonstrate roughly 5-6 min standing tolerance max due to mild LBP and overall limited standing tolerance and seated rests between standing activity. Pt left at end of session supine as per request with bed alarm set, safety needs and call button in place.    2nd Session   Pt eager to work with OT as he enjoys addressing community integration to prepare for discharge. Pt cooperative and pleasant throughout session. Continues to have limited insignt and sensitivity to changes in routine and reports he cannot wait to go home. OT transported pt to 1st floor for energy conservation. Amb 75 ft x 2 from w/c around atrium to banquette and arm chair for practice accessing with close S. Seated rest between.  Back on unit, pt pushed shopping cart ~ 150 ft for simulated shopping activity with S only. No LOB. Tub transfer to shower seat in demo tub room with S and OT strongly  encouraging pt not to try and tub bathe at present and rec shower seat. Pt verbalized understanding. Pt taken to demo apt for simple light item retrieval in kitchen with RW. Required S for basic item reaching, bridging items along counter, frozzen dinner retrieval from freezer and simulated prep in and out of microwave.  Following session, pt was transported to door of room and then able to amb to bed with RW with S and was left with nurse call button, needs and safety measures in place.    Therapy Documentation Precautions:  Precautions Precautions: Fall Precaution Comments: permissive HTN, Rt shoulder and elbow pain Restrictions Weight Bearing Restrictions: No    Therapy/Group: Individual Therapy  Barnabas Lister 11/25/2021, 4:08 PM

## 2021-11-25 NOTE — Progress Notes (Signed)
Physical Therapy Session Note  Patient Details  Name: Jose [name] MRN: [id number] Date of Birth: 05-27-1935  Today's Date: 11/25/2021 PT Individual Time: 0915-1015 PT Individual Time Calculation (min): 60 min   Short Term Goals: Week 1:  PT Short Term Goal 1 (Week 1): Pt will perform sit to stand with min A with LRAD PT Short Term Goal 2 (Week 1): Pt will perform bed mobility CGA or less PT Short Term Goal 3 (Week 1): Pt will ambulate 15 ft CGA or less with LRAD  Skilled Therapeutic Interventions/Progress Updates: Pt presented in bed agreeable to therapy. Pt denies pain at rest, did c/o knee pain and R shoulder pain during session with activity. Rest breaks provided as needed throughout session. Nsg arrived to administer am meds and pt performed bed mobility with supervision to receive meds. Pt then returned to supine after meds stating feeling very lethargic which he contributed to sleeping meds. PTA obtained coffee for pt and with increased time was able to arouse to participate. Pt returned to EOB in same manner as prior and provided with RW. Pt ambulated with CGA fading to close supervision to rehab gym. Pt noted to ambulate with inconsistent gait speed, at times slowing down and looking around then at other times would speed up with audible sounds from pt. In gym pt participated in Sit to stand with 1Kg weighted ball x 10 for BLE strengthening and forced challenge of anterior weight shifting. Pt also participated in ball bounce without AD with green physioball. Pt performed x 10 forward and with L/R rotation. Pt with decreased rotation when performing to L however no LOB noted. Pt also participated in x 2 rounds of horseshoes while standing on Airex for increased ankle strategy. Pt did require intermittent UE support however was able to perform reaching with RUE to 90 degrees, with lateral reaches including crossing midline with CGA overall. Participated in gait training without AD 70f x  2. Pt able to ambulate with CGA but demonstrated forward flexed posture, decreased B knee extension, and inconsistent gait speed. Pt also participated in obstacle course without AD, including weaving through cones, stepping over thresholds, stepping onto 4in step, and sidestepping through agility ladder. Pt demonstrated fair safety when ambulating facing forward, but required min to modA when sidestepping on agility ladder. During sidestepping pt tended to have more of posterior bias with PTA stopping pt x 3 to have pt correct self as pt was unaware. Pt with x 2 episodes of knee buckling towards end of obstacle course but pt was able to self correct.  After extended rest pt ambulated back to room with RW and CGA. Pt requesting to return to bed at end of session. Pt returned to bed with supervision and repositioned to comfort. Pt left in bed with alarm on, call bell within reach and needs met.      Therapy Documentation Precautions:  Precautions Precautions: Fall Precaution Comments: permissive HTN, Rt shoulder and elbow pain Restrictions Weight Bearing Restrictions: No General:   Vital Signs:   Pain:   Mobility:   Locomotion :    Trunk/Postural Assessment :    Balance:   Exercises:   Other Treatments:      Therapy/Group: Individual Therapy  Adriena Manfre 11/25/2021, 12:47 PM

## 2021-11-26 DIAGNOSIS — M10021 Idiopathic gout, right elbow: Secondary | ICD-10-CM

## 2021-11-26 DIAGNOSIS — F1010 Alcohol abuse, uncomplicated: Secondary | ICD-10-CM

## 2021-11-26 DIAGNOSIS — N3281 Overactive bladder: Secondary | ICD-10-CM

## 2021-11-26 NOTE — Progress Notes (Signed)
Speech Language Pathology Discharge Summary  Patient Details  Name: Jose [name] MRN: [id number] Date of Birth: 1935/04/13  Date of Discharge from SLP service:November 28, 2021  {chl ip rehab slp time calculations:304100500}   Skilled Therapeutic Interventions:  ***     Patient has met 1 of 4 long term goals.  Patient to discharge at overall Min;Mod;Supervision level.  Reasons goals not met: pt is in deinal of cognitive deficits impact on functional task   Clinical Impression/Discharge Summary:   Pt made poor progress meeting 1 out 4 goals, due to continued reduced intellectual awareness of deficits and their impact on functional tasks. Pt supports all cognitive deficits are due to "chronic fatigue" verse acute CVA. Pt will intermittent address acute changes, but awareness was not reached during CIR stay. Pt demonstrated improved recall of visual aids, but minimized therapy tasks when performance was poor in medication/money/time management functional task. Pt's barriers at discharge is intellectual awareness, mildly complex problem solving, selective attention and short term recall. SLP suspects pt is near baseline and family should manage all IADLs. Education***. Pt benefited from CIR stay, recommending 24 hour supervision A and continued OPST if pt desires.  Care Partner:  Caregiver Able to Provide Assistance: Yes  Type of Caregiver Assistance: Physical;Cognitive  Recommendation:  24 hour supervision/assistance;Outpatient SLP  Rationale for SLP Follow Up: Maximize cognitive function and independence;Reduce caregiver burden   Equipment: N/A   Reasons for discharge: Discharged from hospital   Patient/Family Agrees with Progress Made and Goals Achieved: Yes    Kriss Perleberg  [facility] 11/26/2021, 3:04 PM

## 2021-11-26 NOTE — Progress Notes (Signed)
PROGRESS NOTE   Subjective/Complaints:  Pt doing well. Trying to eat well to help speed his recovery. No issues this morning  ROS: Patient denies fever, rash, sore throat, blurred vision, dizziness, nausea, vomiting, diarrhea, cough, shortness of breath or chest pain, joint or back/neck pain, headache, or mood change.   Objective:   No results found. No results for input(s): "WBC", "HGB", "HCT", "PLT" in the last 72 hours.  No results for input(s): "NA", "K", "CL", "CO2", "GLUCOSE", "BUN", "CREATININE", "CALCIUM" in the last 72 hours.   Intake/Output Summary (Last 24 hours) at 11/26/2021 0941 Last data filed at 11/26/2021 0915 Gross per 24 hour  Intake 829 ml  Output 2450 ml  Net -1621 ml     Pressure Injury 11/20/21 Buttocks Left Stage 1 -  Intact skin with non-blanchable redness of a localized area usually over a bony prominence. deep red (Active)  11/20/21 1641  Location: Buttocks  Location Orientation: Left (closer to coccyx)  Staging: Stage 1 -  Intact skin with non-blanchable redness of a localized area usually over a bony prominence.  Wound Description (Comments): deep red  Present on Admission: Yes     Pressure Injury 11/20/21 Buttocks Right Stage 2 -  Partial thickness loss of dermis presenting as a shallow open injury with a red, pink wound bed without slough. (Active)  11/20/21 1642  Location: Buttocks  Location Orientation: Right  Staging: Stage 2 -  Partial thickness loss of dermis presenting as a shallow open injury with a red, pink wound bed without slough.  Wound Description (Comments):   Present on Admission: Yes    Physical Exam: Vital Signs Blood pressure 117/79, pulse 79, temperature 97.8 F (36.6 C), temperature source Oral, resp. rate 18, height '5\' 4"'$  (1.626 m), weight 68.3 kg, SpO2 98 %.     Constitutional: No distress . Vital signs reviewed. HEENT: NCAT, EOMI, oral membranes moist Neck:  supple Cardiovascular: RRR without murmur. No JVD    Respiratory/Chest: CTA Bilaterally without wheezes or rales. Normal effort    GI/Abdomen: BS +, non-tender, non-distended Ext: no clubbing, cyanosis, or edema Psych: pleasant and cooperative  Musculoskeletal:        General: Tenderness present. No swelling.     Comments: RIght elbow pain with ROM, limited endrange ext, no erythema - no change today Skin:    General: Skin is warm and dry. Buttocks as above.  Neurological:     Mental Status: He is alert.     Cranial Nerves: No dysarthria or facial asymmetry.     Motor: No tremor or abnormal muscle tone.     Coordination: Coordination abnormal.     Gait: Gait abnormal.     Comments: Motor strength is 4/5 bilateral deltoid 3/5 right tricep 4/5 left tricep 4/5 bilateral grip 4/5 bilateral hip flexor knee extensor ankle dorsiflexors. Stable exam  Assessment/Plan: 1. Functional deficits which require 3+ hours per day of interdisciplinary therapy in a comprehensive inpatient rehab setting. Physiatrist is providing close team supervision and 24 hour management of active medical problems listed below. Physiatrist and rehab team continue to assess barriers to discharge/monitor patient progress toward functional and medical goals  Care Tool:  Bathing  Body parts bathed by patient: Right arm, Left arm, Chest, Abdomen, Front perineal area, Face, Right upper leg, Left upper leg, Buttocks, Right lower leg, Left lower leg   Body parts bathed by helper: Right lower leg, Left lower leg, Buttocks     Bathing assist Assist Level: Supervision/Verbal cueing     Upper Body Dressing/Undressing Upper body dressing   What is the patient wearing?: Pull over shirt    Upper body assist Assist Level: Independent    Lower Body Dressing/Undressing Lower body dressing      What is the patient wearing?: Underwear/pull up, Pants     Lower body assist Assist for lower body dressing:  Supervision/Verbal cueing     Toileting Toileting    Toileting assist Assist for toileting: Independent with assistive device Assistive Device Comment: urinal   Transfers Chair/bed transfer  Transfers assist     Chair/bed transfer assist level: Maximal Assistance - Patient 25 - 49%     Locomotion Ambulation   Ambulation assist      Assist level: Contact Guard/Touching assist Assistive device: Walker-rolling Max distance: 165   Walk 10 feet activity   Assist     Assist level: Contact Guard/Touching assist Assistive device: Walker-rolling   Walk 50 feet activity   Assist    Assist level: Contact Guard/Touching assist Assistive device: Walker-rolling    Walk 150 feet activity   Assist Walk 150 feet activity did not occur: Safety/medical concerns (unable to perform due to decreased balance, coordination and activity tolerance)  Assist level: Contact Guard/Touching assist Assistive device: Walker-rolling    Walk 10 feet on uneven surface  activity   Assist     Assist level: Contact Guard/Touching assist (ramp) Assistive device: Walker-rolling   Wheelchair     Assist Is the patient using a wheelchair?: Yes Type of Wheelchair: Manual    Wheelchair assist level: Supervision/Verbal cueing Max wheelchair distance: 150 ft    Wheelchair 50 feet with 2 turns activity    Assist        Assist Level: Supervision/Verbal cueing   Wheelchair 150 feet activity     Assist      Assist Level: Supervision/Verbal cueing   Blood pressure 117/79, pulse 79, temperature 97.8 F (36.6 C), temperature source Oral, resp. rate 18, height '5\' 4"'$  (1.626 m), weight 68.3 kg, SpO2 98 %.  Medical Problem List and Plan: 1. 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 Multiple issues surround his etoh abuse, safety at home, girl friend's  willingness to help him, etc  -Continue CIR therapies including PT, OT, SLP 2.  Antithrombotics:  -DVT/anticoagulation:  Pharmaceutical: Lovenox- started 10/30- wasn't in orders               -antiplatelet therapy: ASA '81mg'$  3. Pain Management: Tylenol for mild pain tramadol for moderate to severe pain 4. Mood/Behavior/Sleep: Melatonin '3mg'$    10/30- will try Trazodone 50 mg QHS for sleep  11/1- will increase trazodone to 100 mg QHS and added Celexa 20 mg daily due to alcoholic behaviors             -antipsychotic agents: N/A 5. Neuropsych/cognition: This patient is not ?capable of making decisions on his own behalf.  11/1- per SLP not capable of making decisions for himself- very adversarial and insists not only that he  be discharged, but that GF pick him up- who refuses- wants him to go to nursing home unless stops drinking/stops doing things to make himself fall 6. Skin/Wound Care: monitor  order eucerin prn 7. Fluids/Electrolytes/Nutrition: check CMET in am, monitor I/Os, supplement with MVI and VIt C 9.  Thiamine deficiency cont B1 '250mg'$  qd through 10/31, start '100mg'$  per day on 11/23/21, pt states he was treated for this a couple years ago as well  10/31- per GF of 35 years- is alcoholic and won't stop drinking.  10.  GERD- may be related to ETOH use, cont protonix '40mg'$  per day 11. GOut RIght elbow cont prednisone '20mg'$  per day x 5-7d depending on resolution  10/30- pain is pretty much resolved- if Cultures stay negative (per Ortho) and pain resolved, will stop early 12.  Overactive bladder  Ditropan XL '10mg'$  qhs, monitor for retention   10/30- using condom catheter at night  10/31- actually using at all times- will wean to just at night, then off.   11.1- trying to wean off          LOS: 6 days A FACE TO FACE EVALUATION WAS PERFORMED  Meredith Staggers 11/26/2021, 9:41 AM

## 2021-11-26 NOTE — Progress Notes (Signed)
Physical Therapy Session Note  Patient Details  Name: Jose [name] MRN: [id number] Date of Birth: 1935-09-04  Today's Date: 11/26/2021 PT Individual Time: 0850-0947 PT Individual Time Calculation (min): 57 min   Short Term Goals: Week 1:  PT Short Term Goal 1 (Week 1): Pt will perform sit to stand with min A with LRAD PT Short Term Goal 2 (Week 1): Pt will perform bed mobility CGA or less PT Short Term Goal 3 (Week 1): Pt will ambulate 15 ft CGA or less with LRAD  Skilled Therapeutic Interventions/Progress Updates:     Pt greeted supine in bed with EOB elevated and agreeable to therapy. No c/o pain at rest or throughout session.  Pt able to don pants in supine with set up assist and bridging x 3. Supine>sit with SPV and total assist with donning shoes for time.  Stand pivot transfer to the L with no AD and CGA.  Sit<>stand throughout session from couch, WC, and EOM with SPV. Cues for correct hand placement with pushing up to standing.   Gait training:  179f, 1064f 15073fith RW and CGA.  Pt demonstrating the following gait deviations and SPT providing the described cuing and facilitation for improvement:    -inconsistent gait speed, step length, and stride length. Taking short slow steps at times and then changing to large steps and increased gait speed other times.  -amb on L side of RW and occasional kicking the L posterior RW leg or hitting L knee on it with cues to bring body more in the center of the RW.  -Pt notably more steady in the RW as compared to without RW   [id number] no AD and CGA/min assist - same gait pattern as above, but with increased unsteadiness and more inconsistent gait speed and pattern as compared to with RW. 1 LOB which min assist was needed to correct, arms abducted wide out to the sides at times in a protective manner to increase balance.   Stair negotiation:  -12 steps (6 inch) performed at stair case with B UE support on right and left  hand rails. Pt hunched over and cues for placing more of his foot on each step prior to stepping up or down. Reciprocal pattern with min assist for balance.  -4 steps (6 in) with progression to CGA and single UE support on R HR with ascending and B HR descending. Pt switching between reciprocal and step to pattern. Cues to slow down and making sure foot is secure on step prior to moving fwd.   Agility ladder: Fwd stepping x 3 with no AD and min assist for balance. Pt unable to maintain steps inside the squares and frequently steps outside of or on ladder. Cues given to slow down steps to increase focus. Pt performed slightly better by 3rd set.   Pt performed personal hygiene tasks involving reaching and bending while standing at the sink with SPV and amb back to bed ~10ft 75f RW and CGA with cues on RW management.   Educated pt on recommendation to use RW while at home to increase safety and pt was mildly receptive.   Pt left supine in bed with call bell in reach, bed alarm on, all needs met.   Therapy Documentation Precautions:  Precautions Precautions: Fall Precaution Comments: permissive HTN, Rt shoulder and elbow pain Restrictions Weight Bearing Restrictions: No  Therapy/Group: Individual Therapy  Lorieann Argueta KKayleen Memos11/04/2021, 10:03 AM

## 2021-11-26 NOTE — Progress Notes (Signed)
Speech Language Pathology Daily Session Note  Patient Details  Name: Jose [name] MRN: [id number] Date of Birth: 07/02/1935  Today's Date: 11/26/2021 SLP Individual Time: 1402-1450 SLP Individual Time Calculation (min): 48 min  Short Term Goals: Week 1: SLP Short Term Goal 1 (Week 1): Pt will demonstrate recall of daily information with use of external and internal aids with mod A verbal cues. SLP Short Term Goal 2 (Week 1): Pt will demonstrated mildly complex problem solving with supervision A verbal cues. SLP Short Term Goal 3 (Week 1): Pt will demonstrate intellectual awarenes and impact of deficits on functional abilities with mod A verbal cues. SLP Short Term Goal 4 (Week 1): Pt will demonstrate selective attention due to external and internal distractions with supervision A verbal cues in 20 minute intervals.  Skilled Therapeutic Interventions: Skilled ST services focused on cognitive skills. SLP facilitated mildly complex problem solving, recall and awareness in account balancing task. Pt required extra time and min A verbal cues for error awareness and recall. Pt scheduling task addressing complex problem solving, requiring max A verbal cues. Pt continues to express cognitive impairments are due to chronic condition " chronic fatigue" not due to acute CVA. Pt supports he can still continue to manage fiances and higher level IADLs, SLP provided education that it is recommend family assist with these tasks. Pt is not in agreement. Pt was left with call bell within reach and alarm set. Recommend to continue ST services.     Pain Pain Assessment Pain Score: 0-No pain  Therapy/Group: Individual Therapy  Jody Aguinaga  [facility] 11/26/2021, 2:55 PM

## 2021-11-27 LAB — CREATININE, SERUM
Creatinine, Ser: 0.9 mg/dL (ref 0.61–1.24)
GFR, Estimated: >60 mL/min (ref >60)

## 2021-11-27 NOTE — Progress Notes (Incomplete)
Inpatient Rehabilitation Discharge Medication Review by a Pharmacist  A complete drug regimen review was completed for this patient to identify any potential clinically significant medication issues.  High Risk Drug Classes Is patient taking? Indication by Medication  Antipsychotic No   Anticoagulant No   Antibiotic No   Opioid No   Antiplatelet Yes ASA 81-CVA  Hypoglycemics/insulin No   Vasoactive Medication No   Chemotherapy No   Other Yes atorvastatin - HLD Protonix - GERD Ditropan - BPH Multivitamin, vitamin C, thiamine - supplement Citalopram-Mood Trazodone-sleep     Type of Medication Issue Identified Description of Issue Recommendation(s)  Drug Interaction(s) (clinically significant)     Duplicate Therapy     Allergy     No Medication Administration End Date     Incorrect Dose     Additional Drug Therapy Needed  PTA: xana 0.5 and ritalin '5mg'$    Significant med changes from prior encounter (inform family/care partners about these prior to discharge).    Other       Clinically significant medication issues were identified that warrant physician communication and completion of prescribed/recommended actions by midnight of the next day:  No  Name of provider notified for urgent issues identified:   Provider Method of Notification:     Pharmacist comments:   Time spent performing this drug regimen review (minutes):  30 minutes   Sandford Craze, PharmD. [facility] Acute Care PGY-1  11/27/2021 3:46 PM

## 2021-11-27 NOTE — Progress Notes (Signed)
Physical Therapy Discharge Summary  Patient Details  Name: Jose [name] MRN: [id number] Date of Birth: July 06, 1935  Date of Discharge from PT service:November 27, 2021   Patient has met 8 of 9 long term goals due to improved activity tolerance, improved balance, improved postural control, increased strength, ability to compensate for deficits, improved awareness, and improved coordination.  Patient to discharge at an ambulatory level Supervision.    Reasons goals not met: Pt continues to require CGA and did not meet supervision goals for stair negotiation due to impaired balance, coordination and postural control.   Recommendation:  Patient will benefit from ongoing skilled PT services in home health setting to continue to advance safe functional mobility, address ongoing impairments in coordination, balance, postural control, and minimize fall risk.  Equipment: RW  Reasons for discharge: treatment goals met and discharge from hospital  Patient/family agrees with progress made and goals achieved: Yes  PT Discharge Precautions/Restrictions Precautions Precautions: Fall Precaution Comments: permissive HTN, Rt shoulder and elbow pain Restrictions Weight Bearing Restrictions: No Pain Interference Pain Effect on Sleep: 0. Does not apply - I have not had any pain or hurting in the past 5 days Pain Interference with Therapy Activities: 0. Does not apply - I have not received rehabilitationtherapy in the past 5 days Pain Interference with Day-to-Day Activities: 1. Rarely or not at all Vision/Perception  Vision - History Ability to See in Adequate Light: 0 Adequate Perception Perception: Impaired Praxis Praxis: Impaired Praxis Impairment Details: Motor planning  Cognition Overall Cognitive Status: Impaired/Different from baseline Arousal/Alertness: Awake/alert Orientation Level: Oriented X4 Selective Attention: Impaired Memory: Impaired Awareness: Impaired Problem  Solving: Impaired Reasoning: Impaired Organizing: Impaired Safety/Judgment: Impaired Sensation Sensation Light Touch: Appears Intact Hot/Cold: Appears Intact Proprioception: Appears Intact Stereognosis: Not tested Coordination Gross Motor Movements are Fluid and Coordinated: No Fine Motor Movements are Fluid and Coordinated: No Coordination and Movement Description: grossly uncoordinated and presents with ataxia Finger Nose Finger Test: mild tremor bilaterally Heel Shin Test: bilateral ataxia Motor  Motor Motor: Ataxia Motor - Skilled Clinical Observations: ataxic and grossly uncoordinated Motor - Discharge Observations: improved coordination with functional transfers and mobility with RW  Mobility Bed Mobility Bed Mobility: Rolling Right;Rolling Left;Supine to Sit;Sit to Supine Rolling Right: Supervision/verbal cueing Supine to Sit: Supervision/Verbal cueing Sit to Supine: Supervision/Verbal cueing Transfers Transfers: Sit to Stand;Stand to Sit;Stand Pivot Transfers Sit to Stand: Supervision/Verbal cueing Stand to Sit: Supervision/Verbal cueing Stand Pivot Transfers: Supervision/Verbal cueing Transfer (Assistive device): Rolling walker Locomotion  Gait Ambulation: Yes Gait Assistance: Supervision/Verbal cueing Gait Distance (Feet): 200 Feet (ft) Assistive device: Rolling walker Gait Assistance Details: Verbal cues for safe use of DME/AE Gait Gait: Yes Gait Pattern: Impaired Gait Pattern: Ataxic;Shuffle;Narrow base of support;Decreased step length - right;Decreased step length - left;Step-to pattern Gait velocity: decreased Stairs / Additional Locomotion Stairs: Yes Stairs Assistance: Contact Guard/Touching assist Stair Management Technique: Two rails Number of Stairs: 12 Height of Stairs: 6 (inches) Ramp: Supervision/Verbal cueing Curb: Contact Guard/Touching assist Pick up small object from the floor assist level: Supervision/Verbal cueing Wheelchair  Mobility Wheelchair Mobility: No  Trunk/Postural Assessment  Cervical Assessment Cervical Assessment: Exceptions to [facility] (forward head) Thoracic Assessment Thoracic Assessment: Exceptions to [facility] (thoracic kyphosis) Lumbar Assessment Lumbar Assessment: Exceptions to [facility] (posterior pelvic tilt) Postural Control Postural Control: Deficits on evaluation Trunk Control: delyaed righting reactions and displays improved trunk control Righting Reactions: delayed Postural Limitations: poor within narrow base of support  Balance Balance Balance Assessed: Yes Standardized Balance Assessment Standardized Balance Assessment: Merrilee Jansky  Balance Test Berg Balance Test Sit to Stand: Able to stand without using hands and stabilize independently Standing Unsupported: Able to stand 2 minutes with supervision Sitting with Back Unsupported but Feet Supported on Floor or Stool: Able to sit safely and securely 2 minutes Stand to Sit: Sits safely with minimal use of hands Transfers: Able to transfer safely, definite need of hands Standing Unsupported with Eyes Closed: Able to stand 10 seconds with supervision Standing Ubsupported with Feet Together: Needs help to attain position but able to stand for 30 seconds with feet together From Standing, Reach Forward with Outstretched Arm: Can reach forward >12 cm safely (5") From Standing Position, Pick up Object from Floor: Able to pick up shoe, needs supervision From Standing Position, Turn to Look Behind Over each Shoulder: Looks behind one side only/other side shows less weight shift Turn 360 Degrees: Needs close supervision or verbal cueing Standing Unsupported, Alternately Place Feet on Step/Stool: Needs assistance to keep from falling or unable to try Standing Unsupported, One Foot in Front: Loses balance while stepping or standing Standing on One Leg: Unable to try or needs assist to prevent fall Total Score: 32 Static Sitting Balance Static Sitting - Balance  Support: Feet supported Static Sitting - Level of Assistance: 5: Stand by assistance (supervision) Dynamic Sitting Balance Dynamic Sitting - Balance Support: During functional activity Dynamic Sitting - Level of Assistance: 5: Stand by assistance (supervision) Dynamic Sitting - Balance Activities: Lateral lean/weight shifting;Forward lean/weight shifting;Trunk control activities;Reaching for objects;Reaching across midline;Tourist information centre manager Standing - Balance Support: Bilateral upper extremity supported;During functional activity Static Standing - Level of Assistance: 5: Stand by assistance (supervision) Dynamic Standing Balance Dynamic Standing - Balance Support: Bilateral upper extremity supported Dynamic Standing - Level of Assistance: 5: Stand by assistance (Supervision) Dynamic Standing - Balance Activities: Reaching for objects;Lateral lean/weight shifting;Forward lean/weight shifting;[place];Reaching across midline Extremity Assessment  RUE Assessment RUE Assessment: Within Functional Limits General Strength Comments: 4/5 LUE Assessment LUE Assessment: Within Functional Limits General Strength Comments: 4/5 RLE Assessment RLE Assessment: Within Functional Limits General Strength Comments: grossly 5/5 LLE Assessment LLE Assessment: Within Functional Limits General Strength Comments: grossly 5/5   Tanja Port PT, DPT  11/27/2021, 12:15 PM

## 2021-11-27 NOTE — Progress Notes (Signed)
Physical Therapy Session Note  Patient Details  Name: Jose [name] MRN: [id number] Date of Birth: 1935/11/01  Today's Date: 11/27/2021 PT Individual Time: 0900-1000 PT Individual Time Calculation (min): 60 min   Short Term Goals: Week 1:  PT Short Term Goal 1 (Week 1): Pt will perform sit to stand with min A with LRAD PT Short Term Goal 2 (Week 1): Pt will perform bed mobility CGA or less PT Short Term Goal 3 (Week 1): Pt will ambulate 15 ft CGA or less with LRAD  Skilled Therapeutic Interventions/Progress Updates:      Therapy Documentation Precautions:  Precautions Precautions: Fall Precaution Comments: permissive HTN, Rt shoulder and elbow pain Restrictions Weight Bearing Restrictions: No  Pt received semi-reclined in bed, agreeable to PT session and declines pain. Pt requires supervision for safety with rolling, supine to sit, and for static/dynamic sitting balance edge of bed. In position, PT assessed pain interference, sensation, coordination and strength in preparation for discharge. Pt requests to utilize toilet as he reports he has not had a bowel movement in 3 days. Pt provided increased time and was (-) for bowel movement. Nursing notified and performed digital stimulation and pt still (-) for BM. Pt requires supervision for dynamic standing balance with RW for oral care and hand hygiene. Pt supervision for ambulation ~200 ft to main gym and navigated 12 steps ( 6 inches) with 2 HR's with CGA as pt presents with balance impairments. Pt ambulated to room and left semi-reclined in bed with all needs in reach and bed alarm on.    Therapy/Group: Individual Therapy  Verl Dicker Verl Dicker PT, DPT  11/27/2021, 6:56 AM

## 2021-11-28 MED ORDER — ATORVASTATIN CALCIUM 40 MG PO TABS: 40.0000 mg | ORAL_TABLET | Freq: Every day | ORAL | 0 refills | Status: DC

## 2021-11-28 MED ORDER — TRAZODONE HCL 100 MG PO TABS: 100.0000 mg | ORAL_TABLET | Freq: Every day | ORAL | 0 refills | Status: DC

## 2021-11-28 MED ORDER — BISACODYL 5 MG PO TBEC: 5.0000 mg | DELAYED_RELEASE_TABLET | Freq: Every day | ORAL | 0 refills | Status: DC | PRN

## 2021-11-28 MED ORDER — DOCUSATE SODIUM 100 MG PO CAPS: 100.0000 mg | ORAL_CAPSULE | Freq: Two times a day (BID) | ORAL | 0 refills | Status: DC | PRN

## 2021-11-28 MED ORDER — CITALOPRAM HYDROBROMIDE 20 MG PO TABS: 20.0000 mg | ORAL_TABLET | Freq: Every day | ORAL | 0 refills | Status: DC

## 2021-11-28 MED ORDER — ASPIRIN 81 MG PO CHEW: 81.0000 mg | CHEWABLE_TABLET | Freq: Every day | ORAL | 0 refills | Status: DC

## 2021-11-28 MED ORDER — OMEPRAZOLE 20 MG PO CPDR: 20.0000 mg | DELAYED_RELEASE_CAPSULE | Freq: Every morning | ORAL | 0 refills | Status: DC

## 2021-11-28 MED ORDER — ADULT MULTIVITAMIN W/MINERALS CH: 1.0000 | ORAL_TABLET | Freq: Every day | ORAL | Status: DC

## 2021-11-28 MED ORDER — VITAMIN C 500 MG PO TABS: 500.0000 mg | ORAL_TABLET | Freq: Every day | ORAL | 0 refills | Status: DC

## 2021-11-28 MED ORDER — OXYBUTYNIN CHLORIDE ER 10 MG PO TB24: 10.0000 mg | ORAL_TABLET | Freq: Every day | ORAL | 1 refills | Status: DC

## 2021-11-28 MED ORDER — VITAMIN B-1 100 MG PO TABS: 100.0000 mg | ORAL_TABLET | Freq: Every day | ORAL | 0 refills | Status: DC

## 2021-11-28 MED ORDER — ACETAMINOPHEN 325 MG PO TABS: 325.0000 mg | ORAL_TABLET | Freq: Four times a day (QID) | ORAL | Status: DC | PRN

## 2021-11-28 NOTE — Progress Notes (Signed)
PROGRESS NOTE   Subjective/Complaints:  Pt knows today is d/c day Needs f/u- wants a male doctor.    ROS:  Pt denies SOB, abd pain, CP, N/V/C/D, and vision changes  Objective:   No results found. No results for input(s): "WBC", "HGB", "HCT", "PLT" in the last 72 hours.  Recent Labs    11/27/21 0541  CREATININE 0.90     Intake/Output Summary (Last 24 hours) at 11/28/2021 0902 Last data filed at 11/28/2021 0305 Gross per 24 hour  Intake 295 ml  Output 1725 ml  Net -1430 ml     Pressure Injury 11/20/21 Buttocks Left Stage 1 -  Intact skin with non-blanchable redness of a localized area usually over a bony prominence. deep red (Active)  11/20/21 1641  Location: Buttocks  Location Orientation: Left (closer to coccyx)  Staging: Stage 1 -  Intact skin with non-blanchable redness of a localized area usually over a bony prominence.  Wound Description (Comments): deep red  Present on Admission: Yes     Pressure Injury 11/20/21 Buttocks Right Stage 2 -  Partial thickness loss of dermis presenting as a shallow open injury with a red, pink wound bed without slough. (Active)  11/20/21 1642  Location: Buttocks  Location Orientation: Right  Staging: Stage 2 -  Partial thickness loss of dermis presenting as a shallow open injury with a red, pink wound bed without slough.  Wound Description (Comments):   Present on Admission: Yes    Physical Exam: Vital Signs Blood pressure 119/68, pulse 83, temperature 98.6 F (37 C), temperature source Oral, resp. rate 15, height '5\' 4"'$  (1.626 m), weight 68.3 kg, SpO2 95 %.      General: awake, alert, supine in bed; NAD HENT: conjugate gaze; oropharynx moist CV: regular rate; no JVD Pulmonary: CTA B/L; no W/R/R- good air movement GI: soft, NT, ND, (+)BS Psychiatric: frustrated Neurological: more alert Musculoskeletal:        General: Tenderness present. No swelling.     Comments:  RIght elbow pain with ROM, limited endrange ext, no erythema - no change today Skin:    General: Skin is warm and dry. Buttocks as above.  Neurological:     Mental Status: He is alert.     Cranial Nerves: No dysarthria or facial asymmetry.     Motor: No tremor or abnormal muscle tone.     Coordination: Coordination abnormal.     Gait: Gait abnormal.     Comments: Motor strength is 4/5 bilateral deltoid 3/5 right tricep 4/5 left tricep 4/5 bilateral grip 4/5 bilateral hip flexor knee extensor ankle dorsiflexors. Stable exam  Assessment/Plan: 1. Functional deficits which require 3+ hours per day of interdisciplinary therapy in a comprehensive inpatient rehab setting. Physiatrist is providing close team supervision and 24 hour management of active medical problems listed below. Physiatrist and rehab team continue to assess barriers to discharge/monitor patient progress toward functional and medical goals  Care Tool:  Bathing    Body parts bathed by patient: Right arm, Left arm, Chest, Abdomen, Front perineal area, Face, Right upper leg, Left upper leg, Buttocks, Right lower leg, Left lower leg   Body parts bathed by helper: Right lower  leg, Left lower leg, Buttocks     Bathing assist Assist Level: Supervision/Verbal cueing     Upper Body Dressing/Undressing Upper body dressing   What is the patient wearing?: Pull over shirt    Upper body assist Assist Level: Independent    Lower Body Dressing/Undressing Lower body dressing      What is the patient wearing?: Underwear/pull up, Pants     Lower body assist Assist for lower body dressing: Supervision/Verbal cueing     Toileting Toileting    Toileting assist Assist for toileting: Independent with assistive device Assistive Device Comment: urinal   Transfers Chair/bed transfer  Transfers assist     Chair/bed transfer assist level: Supervision/Verbal cueing     Locomotion Ambulation   Ambulation assist       Assist level: Supervision/Verbal cueing Assistive device: Walker-rolling Max distance: 200 ft   Walk 10 feet activity   Assist     Assist level: Supervision/Verbal cueing Assistive device: Walker-rolling   Walk 50 feet activity   Assist    Assist level: Supervision/Verbal cueing Assistive device: Walker-rolling    Walk 150 feet activity   Assist Walk 150 feet activity did not occur: Safety/medical concerns (unable to perform due to decreased balance, coordination and activity tolerance)  Assist level: Supervision/Verbal cueing Assistive device: Walker-rolling    Walk 10 feet on uneven surface  activity   Assist     Assist level: Supervision/Verbal cueing Assistive device: Walker-rolling   Wheelchair     Assist Is the patient using a wheelchair?: No Type of Wheelchair: Manual    Wheelchair assist level: Supervision/Verbal cueing Max wheelchair distance: 150 ft    Wheelchair 50 feet with 2 turns activity    Assist        Assist Level: Supervision/Verbal cueing   Wheelchair 150 feet activity     Assist      Assist Level: Supervision/Verbal cueing   Blood pressure 119/68, pulse 83, temperature 98.6 F (37 C), temperature source Oral, resp. rate 15, height '5\' 4"'$  (1.626 m), weight 68.3 kg, SpO2 95 %.  Medical Problem List and Plan: 1. 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 Multiple issues surround his etoh abuse, safety at home, girl friend's willingness to help him, etc  -d/c today- will need f/u- will arrange with Dr Marciano Sequin or Dr Letta Pate. Doesn't want male doctor.  2.  Antithrombotics:  -DVT/anticoagulation:  Pharmaceutical: Lovenox- started 10/30- wasn't in orders               -antiplatelet therapy: ASA '81mg'$  3. Pain Management: Tylenol for mild pain tramadol for moderate to severe pain 4. Mood/Behavior/Sleep:  Melatonin '3mg'$    10/30- will try Trazodone 50 mg QHS for sleep  11/1- will increase trazodone to 100 mg QHS and added Celexa 20 mg daily due to alcoholic behaviors             -antipsychotic agents: N/A 5. Neuropsych/cognition: This patient is not ?capable of making decisions on his own behalf.  11/1- per SLP not capable of making decisions for himself- very adversarial and insists not only that he be discharged, but that GF pick him up- who refuses- wants him to go to nursing home unless stops drinking/stops doing things to make himself fall 6. Skin/Wound Care: monitor  order eucerin prn 7. Fluids/Electrolytes/Nutrition:  check CMET in am, monitor I/Os, supplement with MVI and VIt C 9.  Thiamine deficiency cont B1 '250mg'$  qd through 10/31, start '100mg'$  per day on 11/23/21, pt states he was treated for this a couple years ago as well  10/31- per GF of 35 years- is alcoholic and won't stop drinking.  10.  GERD- may be related to ETOH use, cont protonix '40mg'$  per day 11. GOut RIght elbow cont prednisone '20mg'$  per day x 5-7d depending on resolution  10/30- pain is pretty much resolved- if Cultures stay negative (per Ortho) and pain resolved, will stop early 12.  Overactive bladder  Ditropan XL '10mg'$  qhs, monitor for retention   10/30- using condom catheter at night  10/31- actually using at all times- will wean to just at night, then off.   11.1- trying to wean off          LOS: 8 days A FACE TO FACE EVALUATION WAS PERFORMED  Arsal Tappan 11/28/2021, 9:02 AM

## 2021-11-28 NOTE — Plan of Care (Signed)
  Problem: Consults Goal: RH STROKE PATIENT EDUCATION Description: See Patient Education module for education specifics  Outcome: Completed/Met   Problem: RH BOWEL ELIMINATION Goal: RH STG MANAGE BOWEL WITH ASSISTANCE Description: STG Manage Bowel with min  Assistance. Outcome: Completed/Met Goal: RH STG MANAGE BOWEL W/MEDICATION W/ASSISTANCE Description: STG Manage Bowel with Medication with min Assistance. Outcome: Completed/Met   Problem: RH BLADDER ELIMINATION Goal: RH STG MANAGE BLADDER WITH ASSISTANCE Description: STG Manage Bladder With min Assistance Outcome: Completed/Met   Problem: RH SKIN INTEGRITY Goal: RH STG SKIN FREE OF INFECTION/BREAKDOWN Description: Skin will be free of infection/breakdown with min assist Outcome: Completed/Met Goal: RH STG ABLE TO PERFORM INCISION/WOUND CARE W/ASSISTANCE Description: STG Able To Perform Incision/Wound Care With min Assistance. Outcome: Completed/Met   Problem: RH SAFETY Goal: RH STG ADHERE TO SAFETY PRECAUTIONS W/ASSISTANCE/DEVICE Description: STG Adhere to Safety Precautions With  min Assistance/Device. Outcome: Completed/Met   Problem: RH COGNITION-NURSING Goal: RH STG USES MEMORY AIDS/STRATEGIES W/ASSIST TO PROBLEM SOLVE Description: STG Uses Memory Aids/Strategies With min Assistance to Problem Solve. Outcome: Completed/Met   Problem: RH PAIN MANAGEMENT Goal: RH STG PAIN MANAGED AT OR BELOW PT'S PAIN GOAL Description: Pain will be managed at 4 out of 10 on pain scale with min assist Outcome: Completed/Met   Problem: RH KNOWLEDGE DEFICIT Goal: RH STG INCREASE KNOWLEDGE OF STROKE PROPHYLAXIS Description: Patient/caregiver will be able to verbalize medications related to stroke prophylaxis from nursing educations ad handouts independently   Outcome: Completed/Met

## 2021-11-28 NOTE — Progress Notes (Addendum)
Inpatient Rehabilitation Discharge Medication Review by a Pharmacist  A complete drug regimen review was completed for this patient to identify any potential clinically significant medication issues.  High Risk Drug Classes Is patient taking? Indication by Medication  Antipsychotic No   Anticoagulant No   Antibiotic No   Opioid No   Antiplatelet Yes ASA 81-CVA  Hypoglycemics/insulin No   Vasoactive Medication No   Chemotherapy No   Other Yes atorvastatin - HLD Protonix - GERD Ditropan - BPH Multivitamin, vitamin C, thiamine - supplement Citalopram-Mood Trazodone-sleep     Type of Medication Issue Identified Description of Issue Recommendation(s)  Drug Interaction(s) (clinically significant)     Duplicate Therapy     Allergy     No Medication Administration End Date     Incorrect Dose     Additional Drug Therapy Needed  PTA: xana 0.5 and ritalin '5mg'$ , baclofen  All stopped  Significant med changes from prior encounter (inform family/care partners about these prior to discharge).    Other       Clinically significant medication issues were identified that warrant physician communication and completion of prescribed/recommended actions by midnight of the next day:  No  Name of provider notified for urgent issues identified:   Provider Method of Notification:     Pharmacist comments:   Time spent performing this drug regimen review (minutes):  30 minutes   Anette Guarneri, PharmD  11/27/2021 3:46 PM

## 2021-11-28 NOTE — Progress Notes (Signed)
Inpatient Rehabilitation Care Coordinator Discharge Note   Patient Details  Name: Jose [name] MRN: [id number] Date of Birth: 07/25/1935   Discharge location: [place] 24/7 SUPERVISION  Length of Stay: 8 DAYS  Discharge activity level: SUPERVISION LEVEL  Home/community participation: ACTIVE  Patient response BT:DVVOHY Literacy - How often do you need to have someone help you when you read instructions, pamphlets, or other written material from your doctor or pharmacy?: Sometimes  Patient response WV:PXTGGY Isolation - How often do you feel lonely or isolated from those around you?: Never  Services provided included: MD, RD, PT, OT, SLP, RN, CM, TR, Pharmacy, Neuropsych, SW  Financial Services:  Charity fundraiser Utilized: Medicare    Choices offered to/list presented to: PT AND PARTNER  Follow-up services arranged:  Home Health, Patient/Family has no preference for HH/DME agencies [place]: [place]    DME : HAS [place], ANNE TO GET [place]    Patient response to transportation need: Is the patient able to respond to transportation needs?: Yes In the past 12 months, has lack of transportation kept you from medical appointments or from getting medications?: No In the past 12 months, has lack of transportation kept you from meetings, work, or from getting things needed for daily living?: No    Comments (or additional information): [name] WAS [place]. SHE WILL BE PROVIDING SUPERVISION AT DISCHARGE, BUT DOES HAVE THE PRIVATE DUTY LIST TO HIRE ASSIST IF NEEDED. PT DECLINED SUBSTANCE ABUSE RESOURCES.   Patient/Family verbalized understanding of follow-up arrangements:  Yes  Individual responsible for coordination of the follow-up plan: [name]-PARTNER [phone]  Confirmed correct DME delivered: [name] 11/28/2021    [name]

## 2021-11-28 NOTE — Progress Notes (Signed)
Patient discharged off of unit with all belongings. Discharge papers/instructions explained by physician assistant to family. Patient and family have no further questions at time of discharge. No complications noted at this time.  Ronita Hargreaves L Anush Wiedeman  

## 2021-11-29 ENCOUNTER — Telehealth: Payer: Self-pay

## 2021-11-29 NOTE — Telephone Encounter (Addendum)
Transition Care Management Unsuccessful Follow-up Telephone Call  Date of discharge and from where:  [place] 11-28-21 Dx: Subcortical infarction  Attempts:  1st Attempt  Reason for unsuccessful TCM follow-up call:  Left voice message  Juanda Crumble LPN [place] Direct Dial [phone]  Transition Care Management Follow-up Telephone Call Date of discharge and from where:   [place] 11-28-21 Dx: Subcortical infarction   How have you been since you were released from the hospital? Doing better  Any questions or concerns? No  Items Reviewed: Did the pt receive and understand the discharge instructions provided? Yes  Medications obtained and verified? Yes  Other? No  Any new allergies since your discharge? No  Dietary orders reviewed? Yes Do you have support at home? Yes   Home Care and Equipment/Supplies: Were home health services ordered? Yes PT If so, what is the name of the agency? Unsure of name  Has the agency set up a time to come to the patient's home? yes Were any new equipment or medical supplies ordered?  NO- pt bought shower chair on their own  What is the name of the medical supply agency? na Were you able to get the supplies/equipment? not applicable Do you have any questions related to the use of the equipment or supplies? No  Functional Questionnaire: (I = Independent and D = Dependent) ADLs: I  Bathing/Dressing- I  Meal Prep- I  Eating- I  Maintaining continence- I  Transferring/Ambulation- I-WALKER  Managing Meds- I  Follow up appointments reviewed:  PCP Hospital f/u appt confirmed? Yes  Scheduled to see Dr Sharlet Salina on 12-12-21 @ [facility] f/u appt confirmed? Yes  Scheduled to see Dr Marciano Sequin on 12-08-21 @ 1240pm. Are transportation arrangements needed? No  If their condition worsens, is the pt aware to call PCP or go to the Emergency Dept.? Yes Was the patient provided with contact information for  the PCP's office or ED? Yes Was to pt encouraged to call back with questions or concerns? Yes   Juanda Crumble LPN [place] Direct Dial [phone]

## 2021-11-30 ENCOUNTER — Telehealth: Payer: Self-pay | Admitting: Internal Medicine

## 2021-11-30 NOTE — Telephone Encounter (Signed)
Katherine from [place] called for verbal orders for Pt 1x a week for 5 weeks.  She has a secure VM so we can leave the verbals on her phone if she doesn't pick up.  Please call Belenda Cruise to confirm:   [phone]

## 2021-12-01 NOTE — Telephone Encounter (Signed)
Fine for verbals but needs to keep follow up visit.

## 2021-12-05 NOTE — Telephone Encounter (Signed)
Contacted Belenda Cruise and gave verbal okay orders on her voicemail.

## 2021-12-08 ENCOUNTER — Other Ambulatory Visit: Payer: Self-pay

## 2021-12-08 ENCOUNTER — Emergency Department (HOSPITAL_COMMUNITY): Payer: Medicare Other

## 2021-12-08 ENCOUNTER — Inpatient Hospital Stay (HOSPITAL_COMMUNITY)
Admission: EM | Admit: 2021-12-08 | Discharge: 2021-12-13 | DRG: 871 | Disposition: A | Discharge status: Home Health | Payer: Medicare Other | Attending: Internal Medicine | Admitting: Internal Medicine

## 2021-12-08 ENCOUNTER — Encounter: Payer: Medicare Other | Admitting: Physical Medicine & Rehabilitation

## 2021-12-08 ENCOUNTER — Encounter (HOSPITAL_COMMUNITY): Payer: Self-pay

## 2021-12-08 DIAGNOSIS — G4730 Sleep apnea, unspecified: Secondary | ICD-10-CM | POA: Diagnosis present

## 2021-12-08 DIAGNOSIS — F32A Depression, unspecified: Secondary | ICD-10-CM | POA: Diagnosis present

## 2021-12-08 DIAGNOSIS — J9601 Acute respiratory failure with hypoxia: Secondary | ICD-10-CM | POA: Diagnosis present

## 2021-12-08 DIAGNOSIS — E872 Acidosis, unspecified: Secondary | ICD-10-CM | POA: Diagnosis present

## 2021-12-08 DIAGNOSIS — Z79899 Other long term (current) drug therapy: Secondary | ICD-10-CM

## 2021-12-08 DIAGNOSIS — E440 Moderate protein-calorie malnutrition: Secondary | ICD-10-CM | POA: Insufficient documentation

## 2021-12-08 DIAGNOSIS — Z8249 Family history of ischemic heart disease and other diseases of the circulatory system: Secondary | ICD-10-CM

## 2021-12-08 DIAGNOSIS — K219 Gastro-esophageal reflux disease without esophagitis: Secondary | ICD-10-CM | POA: Diagnosis present

## 2021-12-08 DIAGNOSIS — Z9049 Acquired absence of other specified parts of digestive tract: Secondary | ICD-10-CM | POA: Diagnosis not present

## 2021-12-08 DIAGNOSIS — F419 Anxiety disorder, unspecified: Secondary | ICD-10-CM | POA: Diagnosis present

## 2021-12-08 DIAGNOSIS — Z8673 Personal history of transient ischemic attack (TIA), and cerebral infarction without residual deficits: Secondary | ICD-10-CM | POA: Diagnosis not present

## 2021-12-08 DIAGNOSIS — E519 Thiamine deficiency, unspecified: Secondary | ICD-10-CM | POA: Diagnosis present

## 2021-12-08 DIAGNOSIS — R112 Nausea with vomiting, unspecified: Secondary | ICD-10-CM

## 2021-12-08 DIAGNOSIS — G9341 Metabolic encephalopathy: Secondary | ICD-10-CM | POA: Diagnosis present

## 2021-12-08 DIAGNOSIS — Z6821 Body mass index (BMI) 21.0-21.9, adult: Secondary | ICD-10-CM

## 2021-12-08 DIAGNOSIS — Z20822 Contact with and (suspected) exposure to covid-19: Secondary | ICD-10-CM | POA: Diagnosis present

## 2021-12-08 DIAGNOSIS — J189 Pneumonia, unspecified organism: Secondary | ICD-10-CM | POA: Diagnosis present

## 2021-12-08 DIAGNOSIS — Z87891 Personal history of nicotine dependence: Secondary | ICD-10-CM | POA: Diagnosis not present

## 2021-12-08 DIAGNOSIS — K580 Irritable bowel syndrome with diarrhea: Secondary | ICD-10-CM | POA: Diagnosis present

## 2021-12-08 DIAGNOSIS — E785 Hyperlipidemia, unspecified: Secondary | ICD-10-CM | POA: Diagnosis present

## 2021-12-08 DIAGNOSIS — R6520 Severe sepsis without septic shock: Secondary | ICD-10-CM | POA: Diagnosis not present

## 2021-12-08 DIAGNOSIS — R824 Acetonuria: Secondary | ICD-10-CM | POA: Diagnosis present

## 2021-12-08 DIAGNOSIS — A419 Sepsis, unspecified organism: Secondary | ICD-10-CM | POA: Diagnosis not present

## 2021-12-08 DIAGNOSIS — Z7982 Long term (current) use of aspirin: Secondary | ICD-10-CM

## 2021-12-08 DIAGNOSIS — R739 Hyperglycemia, unspecified: Secondary | ICD-10-CM | POA: Diagnosis present

## 2021-12-08 DIAGNOSIS — M4714 Other spondylosis with myelopathy, thoracic region: Secondary | ICD-10-CM | POA: Insufficient documentation

## 2021-12-08 HISTORY — DX: Hyperlipidemia, unspecified: E78.5

## 2021-12-08 HISTORY — DX: Pneumonia, unspecified organism: J18.9

## 2021-12-08 LAB — RESP PANEL BY RT-PCR (FLU A&B, COVID) ARPGX2
Influenza A by PCR: NEGATIVE
Influenza B by PCR: NEGATIVE
SARS Coronavirus 2 by RT PCR: NEGATIVE

## 2021-12-08 LAB — CBC WITH DIFFERENTIAL/PLATELET
Abs Immature Granulocytes: 0.06 10*3/uL (ref 0.00–0.07)
Basophils Absolute: 0 10*3/uL (ref 0.0–0.1)
Basophils Relative: 0 %
Eosinophils Absolute: 0 10*3/uL (ref 0.0–0.5)
Eosinophils Relative: 0 %
HCT: 45.4 % (ref 39.0–52.0)
Hemoglobin: 14.9 g/dL (ref 13.0–17.0)
Immature Granulocytes: 0 %
Lymphocytes Relative: 5 %
Lymphs Abs: 0.6 10*3/uL — ABNORMAL LOW (ref 0.7–4.0)
MCH: 29.7 pg (ref 26.0–34.0)
MCHC: 32.8 g/dL (ref 30.0–36.0)
MCV: 90.4 fL (ref 80.0–100.0)
Monocytes Absolute: 0.8 10*3/uL (ref 0.1–1.0)
Monocytes Relative: 6 %
Neutro Abs: 12.6 10*3/uL — ABNORMAL HIGH (ref 1.7–7.7)
Neutrophils Relative %: 89 %
Platelets: 200 10*3/uL (ref 150–400)
RBC: 5.02 MIL/uL (ref 4.22–5.81)
RDW: 13.4 % (ref 11.5–15.5)
WBC: 14.2 10*3/uL — ABNORMAL HIGH (ref 4.0–10.5)
nRBC: 0 % (ref 0.0–0.2)

## 2021-12-08 LAB — URINALYSIS, ROUTINE W REFLEX MICROSCOPIC
Bilirubin Urine: NEGATIVE
Glucose, UA: NEGATIVE mg/dL
Hgb urine dipstick: NEGATIVE
Ketones, ur: 20 mg/dL — AB
Leukocytes,Ua: NEGATIVE
Nitrite: NEGATIVE
Protein, ur: NEGATIVE mg/dL
Specific Gravity, Urine: 1.021 (ref 1.005–1.030)
pH: 7 (ref 5.0–8.0)

## 2021-12-08 LAB — COMPREHENSIVE METABOLIC PANEL
ALT: 24 U/L (ref 0–44)
AST: 26 U/L (ref 15–41)
Albumin: 3.9 g/dL (ref 3.5–5.0)
Alkaline Phosphatase: 80 U/L (ref 38–126)
Anion gap: 12 (ref 5–15)
BUN: 15 mg/dL (ref 8–23)
CO2: 27 mmol/L (ref 22–32)
Calcium: 10 mg/dL (ref 8.9–10.3)
Chloride: 97 mmol/L — ABNORMAL LOW (ref 98–111)
Creatinine, Ser: 0.97 mg/dL (ref 0.61–1.24)
GFR, Estimated: >60 mL/min (ref >60)
Glucose, Bld: 125 mg/dL — ABNORMAL HIGH (ref 70–99)
Potassium: 3.7 mmol/L (ref 3.5–5.1)
Sodium: 136 mmol/L (ref 135–145)
Total Bilirubin: 0.8 mg/dL (ref 0.3–1.2)
Total Protein: 7.7 g/dL (ref 6.5–8.1)

## 2021-12-08 LAB — PROTIME-INR
INR: 1.1 (ref 0.8–1.2)
Prothrombin Time: 14 seconds (ref 11.4–15.2)

## 2021-12-08 LAB — APTT: aPTT: 28 seconds (ref 24–36)

## 2021-12-08 LAB — STREP PNEUMONIAE URINARY ANTIGEN: Strep Pneumo Urinary Antigen: NEGATIVE

## 2021-12-08 LAB — LACTIC ACID, PLASMA
Lactic Acid, Venous: 2 mmol/L (ref 0.5–1.9)
Lactic Acid, Venous: 0.9 mmol/L (ref 0.5–1.9)

## 2021-12-08 MED ORDER — ONDANSETRON HCL 4 MG/2ML IJ SOLN
4.0000 mg | Freq: Once | INTRAMUSCULAR | Status: AC
  Administered 2021-12-08: 4 mg via INTRAVENOUS
  Filled 2021-12-08: qty 2

## 2021-12-08 MED ORDER — IOHEXOL 300 MG/ML  SOLN
100.0000 mL | Freq: Once | INTRAMUSCULAR | Status: AC | PRN
  Administered 2021-12-08: 100 mL via INTRAVENOUS

## 2021-12-08 MED ORDER — VANCOMYCIN HCL IN DEXTROSE 1-5 GM/200ML-% IV SOLN
1000.0000 mg | Freq: Once | INTRAVENOUS | Status: AC
  Administered 2021-12-08: 1000 mg via INTRAVENOUS
  Filled 2021-12-08: qty 200

## 2021-12-08 MED ORDER — PANTOPRAZOLE SODIUM 40 MG PO TBEC
40.0000 mg | DELAYED_RELEASE_TABLET | Freq: Every day | ORAL | Status: DC
  Administered 2021-12-08 – 2021-12-13 (×6): 40 mg via ORAL
  Filled 2021-12-08 (×6): qty 1

## 2021-12-08 MED ORDER — ACETAMINOPHEN 325 MG PO TABS
650.0000 mg | ORAL_TABLET | Freq: Four times a day (QID) | ORAL | Status: DC | PRN
  Administered 2021-12-10 – 2021-12-13 (×6): 650 mg via ORAL
  Filled 2021-12-08 (×6): qty 2

## 2021-12-08 MED ORDER — ASPIRIN 81 MG PO TBEC
81.0000 mg | DELAYED_RELEASE_TABLET | Freq: Every day | ORAL | Status: DC
  Administered 2021-12-08 – 2021-12-13 (×6): 81 mg via ORAL
  Filled 2021-12-08 (×6): qty 1

## 2021-12-08 MED ORDER — BISACODYL 5 MG PO TBEC: 5.0000 mg | DELAYED_RELEASE_TABLET | Freq: Every day | ORAL | Status: DC | PRN

## 2021-12-08 MED ORDER — LACTATED RINGERS IV BOLUS
1000.0000 mL | Freq: Once | INTRAVENOUS | Status: AC
  Administered 2021-12-08: 1000 mL via INTRAVENOUS

## 2021-12-08 MED ORDER — METRONIDAZOLE 500 MG/100ML IV SOLN
500.0000 mg | Freq: Once | INTRAVENOUS | Status: AC
  Administered 2021-12-08: 500 mg via INTRAVENOUS
  Filled 2021-12-08: qty 100

## 2021-12-08 MED ORDER — ACETAMINOPHEN 650 MG RE SUPP
650.0000 mg | RECTAL | Status: AC
  Administered 2021-12-08: 650 mg via RECTAL
  Filled 2021-12-08: qty 1

## 2021-12-08 MED ORDER — ACETAMINOPHEN 650 MG RE SUPP: 650.0000 mg | Freq: Four times a day (QID) | RECTAL | Status: DC | PRN

## 2021-12-08 MED ORDER — THIAMINE MONONITRATE 100 MG PO TABS
100.0000 mg | ORAL_TABLET | Freq: Every day | ORAL | Status: DC
  Administered 2021-12-08 – 2021-12-13 (×5): 100 mg via ORAL
  Filled 2021-12-08 (×6): qty 1

## 2021-12-08 MED ORDER — SODIUM CHLORIDE (PF) 0.9 % IJ SOLN
INTRAMUSCULAR | Status: AC
  Filled 2021-12-08: qty 50

## 2021-12-08 MED ORDER — CITALOPRAM HYDROBROMIDE 20 MG PO TABS
20.0000 mg | ORAL_TABLET | Freq: Every day | ORAL | Status: DC
  Administered 2021-12-08 – 2021-12-13 (×6): 20 mg via ORAL
  Filled 2021-12-08 (×6): qty 1

## 2021-12-08 MED ORDER — ATORVASTATIN CALCIUM 40 MG PO TABS
40.0000 mg | ORAL_TABLET | Freq: Every day | ORAL | Status: DC
  Administered 2021-12-08 – 2021-12-13 (×6): 40 mg via ORAL
  Filled 2021-12-08 (×6): qty 1

## 2021-12-08 MED ORDER — SODIUM CHLORIDE 0.9 % IV SOLN
2.0000 g | INTRAVENOUS | Status: AC
  Administered 2021-12-08 – 2021-12-12 (×5): 2 g via INTRAVENOUS
  Filled 2021-12-08 (×5): qty 20

## 2021-12-08 MED ORDER — SODIUM CHLORIDE 0.9 % IV SOLN
500.0000 mg | INTRAVENOUS | Status: AC
  Administered 2021-12-08 – 2021-12-12 (×5): 500 mg via INTRAVENOUS
  Filled 2021-12-08 (×5): qty 5

## 2021-12-08 MED ORDER — LACTATED RINGERS IV SOLN: INTRAVENOUS | Status: AC

## 2021-12-08 MED ORDER — SODIUM CHLORIDE 0.9 % IV SOLN
2.0000 g | Freq: Once | INTRAVENOUS | Status: AC
  Administered 2021-12-08: 2 g via INTRAVENOUS
  Filled 2021-12-08: qty 12.5

## 2021-12-08 NOTE — Progress Notes (Signed)
  Carryover admission to the Day Admitter.  I discussed this case with the EDP, Dr. Randal Buba.  Per these discussions:   This is a 86 year old male who was just recently discharged to SNF following hospitalization and neck [facility] in [facility] Patient Was Hospitalized for 1 Week for Acute Subcortical infarct, who is now being admitted for severe sepsis due to pneumonia after presenting with complaints of 1 day of nausea, vomiting, diarrhea, found to be febrile, with temperature max 103.9, as well as tachycardic, and altered relative to baseline mental status.  CT demonstrates pulmonary infiltrates consistent with pneumonia.  Urinalysis currently pending.  COVID-19/influenza PCR negative.  Blood cultures x2 collected prior to initiation of IV vancomycin, cefepime, and Flagyl.  Also noted to be mildly hypoxic on presentation, with initial O2 sats in the high 80s on room air, relative to no known baseline supplemental oxygen requirements.   I have placed an order for inpatient admission for further evaluation management of severe sepsis due to suspected pneumonia, CAP versus HCAP.   I have placed some additional preliminary admit orders via the adult multi-morbid admission order set.  Given the patient's altered mental status, I have kept him n.p.o. for now.  Additionally, will defer additional IV antibiotic selection to the admitting hospitalist.    Babs Bertin, DO Hospitalist

## 2021-12-08 NOTE — ED Triage Notes (Signed)
Pt BIB EMS from home for N/V/D that started around 0200. Pt shaking on scene and has a hx of alcohol withdrawal. Pt has been receiving home occupational therapy until a recent fall.

## 2021-12-08 NOTE — ED Provider Notes (Signed)
[place] DEPT Provider Note   CSN: [id number] Arrival date & time: 12/08/21  0424     History  Chief Complaint  Patient presents with   Nausea   Emesis    BRON SNELLINGS is a 86 y.o. male.  The history is provided by the patient.  Emesis Severity:  Moderate Duration:  2 hours Timing:  Intermittent Quality:  Stomach contents Progression:  Unchanged Chronicity:  New Recent urination:  Normal Context: not post-tussive   Relieved by:  Nothing Worsened by:  Nothing Ineffective treatments:  None tried Associated symptoms: cough and diarrhea   Associated symptoms: no abdominal pain and no fever   Risk factors: no alcohol use        Home Medications Prior to Admission medications   Medication Sig Start Date End Date Taking? Authorizing Provider  acetaminophen (TYLENOL) 325 MG tablet Take 1-2 tablets (325-650 mg total) by mouth every 6 (six) hours as needed for mild pain. 11/28/21  Yes Setzer, Edman Circle, PA-C  ALPRAZolam Duanne Moron) 0.5 MG tablet Take 0.5 mg by mouth at bedtime as needed for anxiety.   Yes [provider]  ascorbic acid (VITAMIN C) 500 MG tablet Take 1 tablet (500 mg total) by mouth daily. 11/28/21  Yes Setzer, Edman Circle, PA-C  aspirin 81 MG chewable tablet Chew 1 tablet (81 mg total) by mouth daily. 11/28/21  Yes Setzer, Edman Circle, PA-C  atorvastatin (LIPITOR) 40 MG tablet Take 1 tablet (40 mg total) by mouth daily. 11/28/21  Yes Setzer, Edman Circle, PA-C  bisacodyl (DULCOLAX) 5 MG EC tablet Take 1 tablet (5 mg total) by mouth daily as needed for moderate constipation. 11/28/21  Yes Setzer, Edman Circle, PA-C  citalopram (CELEXA) 20 MG tablet Take 1 tablet (20 mg total) by mouth daily. 11/29/21  Yes Setzer, Edman Circle, PA-C  docusate sodium (COLACE) 100 MG capsule Take 1 capsule (100 mg total) by mouth 2 (two) times daily as needed for mild constipation. 11/28/21  Yes Setzer, Edman Circle, PA-C  thiamine (VITAMIN B-1) 100 MG tablet Take  1 tablet (100 mg total) by mouth daily. 11/29/21  Yes Setzer, Edman Circle, PA-C  Multiple Vitamin (MULTIVITAMIN WITH MINERALS) TABS tablet Take 1 tablet by mouth daily. Patient not taking: Reported on 11/30/2021 11/28/21   Vaughan Basta, Edman Circle, PA-C  omeprazole (PRILOSEC) 20 MG capsule Take 1 capsule (20 mg total) by mouth every morning. Patient not taking: Reported on 11/30/2021 11/28/21   Barbie Banner, PA-C  oxybutynin (DITROPAN XL) 10 MG 24 hr tablet Take 1 tablet (10 mg total) by mouth at bedtime. Patient not taking: Reported on 11/30/2021 11/28/21   Barbie Banner, PA-C  traZODone (DESYREL) 100 MG tablet Take 1 tablet (100 mg total) by mouth at bedtime. Patient not taking: Reported on 11/30/2021 11/28/21   Barbie Banner, PA-C      Allergies    Patient has no known allergies.    Review of Systems   Review of Systems  Unable to perform ROS: Acuity of condition  Constitutional:  Negative for fever.  HENT:  Negative for facial swelling.   Eyes:  Negative for redness.  Respiratory:  Positive for cough.   Gastrointestinal:  Positive for diarrhea, nausea and vomiting. Negative for abdominal pain.    Physical Exam Updated Vital Signs BP (!) 142/78   Pulse (!) 122   Temp 97.9 F (36.6 C) (Oral)   Resp 20   SpO2 95%  Physical Exam Vitals and nursing note  reviewed.  Constitutional:      General: He is not in acute distress.    Appearance: Normal appearance. He is well-developed. He is not diaphoretic.  HENT:     Head: Normocephalic and atraumatic.     Nose: Nose normal.  Eyes:     Conjunctiva/sclera: Conjunctivae normal.     Pupils: Pupils are equal, round, and reactive to light.  Cardiovascular:     Rate and Rhythm: Regular rhythm. Tachycardia present.  Pulmonary:     Effort: Pulmonary effort is normal.     Breath sounds: Rhonchi and rales present. No wheezing.  Abdominal:     General: Bowel sounds are normal.     Palpations: Abdomen is soft.     Tenderness: There is no  abdominal tenderness. There is no guarding or rebound.  Musculoskeletal:        General: Normal range of motion.     Cervical back: Normal range of motion and neck supple.  Skin:    General: Skin is warm and dry.     Capillary Refill: Capillary refill takes less than 2 seconds.  Neurological:     General: No focal deficit present.     Mental Status: He is alert and oriented to person, place, and time.     Deep Tendon Reflexes: Reflexes normal.  Psychiatric:        Mood and Affect: Mood normal.        Behavior: Behavior normal.     ED Results / Procedures / Treatments   Labs (all labs ordered are listed, but only abnormal results are displayed) Results for orders placed or performed during the hospital encounter of 12/08/21  Resp Panel by RT-PCR (Flu A&B, Covid) Anterior Nasal Swab   Specimen: Anterior Nasal Swab  Result Value Ref Range   SARS Coronavirus 2 by RT PCR NEGATIVE NEGATIVE   Influenza A by PCR NEGATIVE NEGATIVE   Influenza B by PCR NEGATIVE NEGATIVE  Comprehensive metabolic panel  Result Value Ref Range   Sodium 136 135 - 145 mmol/L   Potassium 3.7 3.5 - 5.1 mmol/L   Chloride 97 (L) 98 - 111 mmol/L   CO2 27 22 - 32 mmol/L   Glucose, Bld 125 (H) 70 - 99 mg/dL   BUN 15 8 - 23 mg/dL   Creatinine, Ser 0.97 0.61 - 1.24 mg/dL   Calcium 10.0 8.9 - 10.3 mg/dL   Total Protein 7.7 6.5 - 8.1 g/dL   Albumin 3.9 3.5 - 5.0 g/dL   AST 26 15 - 41 U/L   ALT 24 0 - 44 U/L   Alkaline Phosphatase 80 38 - 126 U/L   Total Bilirubin 0.8 0.3 - 1.2 mg/dL   GFR, Estimated >60 >60 mL/min   Anion gap 12 5 - 15  Lactic acid, plasma  Result Value Ref Range   Lactic Acid, Venous 2.0 (HH) 0.5 - 1.9 mmol/L  CBC with Differential  Result Value Ref Range   WBC 14.2 (H) 4.0 - 10.5 K/uL   RBC 5.02 4.22 - 5.81 MIL/uL   Hemoglobin 14.9 13.0 - 17.0 g/dL   HCT 45.4 39.0 - 52.0 %   MCV 90.4 80.0 - 100.0 fL   MCH 29.7 26.0 - 34.0 pg   MCHC 32.8 30.0 - 36.0 g/dL   RDW 13.4 11.5 - 15.5 %    Platelets 200 150 - 400 K/uL   nRBC 0.0 0.0 - 0.2 %   Neutrophils Relative % 89 %   Neutro Abs 12.6 (  H) 1.7 - 7.7 K/uL   Lymphocytes Relative 5 %   Lymphs Abs 0.6 (L) 0.7 - 4.0 K/uL   Monocytes Relative 6 %   Monocytes Absolute 0.8 0.1 - 1.0 K/uL   Eosinophils Relative 0 %   Eosinophils Absolute 0.0 0.0 - 0.5 K/uL   Basophils Relative 0 %   Basophils Absolute 0.0 0.0 - 0.1 K/uL   Immature Granulocytes 0 %   Abs Immature Granulocytes 0.06 0.00 - 0.07 K/uL  Protime-INR  Result Value Ref Range   Prothrombin Time 14.0 11.4 - 15.2 seconds   INR 1.1 0.8 - 1.2  Urinalysis, Routine w reflex microscopic Urine, Clean Catch  Result Value Ref Range   Color, Urine YELLOW YELLOW   APPearance CLEAR CLEAR   Specific Gravity, Urine 1.021 1.005 - 1.030   pH 7.0 5.0 - 8.0   Glucose, UA NEGATIVE NEGATIVE mg/dL   Hgb urine dipstick NEGATIVE NEGATIVE   Bilirubin Urine NEGATIVE NEGATIVE   Ketones, ur 20 (A) NEGATIVE mg/dL   Protein, ur NEGATIVE NEGATIVE mg/dL   Nitrite NEGATIVE NEGATIVE   Leukocytes,Ua NEGATIVE NEGATIVE  APTT  Result Value Ref Range   aPTT 28 24 - 36 seconds   CT ABDOMEN PELVIS W CONTRAST  Result Date: 12/08/2021 CLINICAL DATA:  Abnormal x-ray/lung nodule. Nausea, vomiting, and diarrhea since early morning. Ethanol withdrawal. EXAM: CT CHEST, ABDOMEN, AND PELVIS WITH CONTRAST TECHNIQUE: Multidetector CT imaging of the chest, abdomen and pelvis was performed following the standard protocol during bolus administration of intravenous contrast. RADIATION DOSE REDUCTION: This exam was performed according to the departmental dose-optimization program which includes automated exposure control, adjustment of the mA and/or kV according to patient size and/or use of iterative reconstruction technique. CONTRAST:  147m OMNIPAQUE IOHEXOL 300 MG/ML  SOLN COMPARISON:  None Available. FINDINGS: CT CHEST FINDINGS Cardiovascular: Normal heart size. No pericardial effusion. Atheromatous calcification  of the aorta and coronaries. No acute vascular finding Mediastinum/Nodes: No hematoma or adenopathy Lungs/Pleura: Dependent pulmonary opacity which could be atelectasis or pneumonia. Mild subpleural reticulation which could be atelectasis or mild interstitial lung disease, no honeycombing. Small pleural based calcification in the upper and posterior right chest; no generalized calcified pleural plaques. There is some pleural based thickening along the lateral left chest wall which is non worrisome fat density on 2:27 Musculoskeletal: Generalized thoracic spine degeneration. Notably severe right glenohumeral osteoarthritis. No acute finding. CT ABDOMEN PELVIS FINDINGS Hepatobiliary: No focal liver abnormality.No evidence of biliary obstruction or stone. Pancreas: Unremarkable. Spleen: Unremarkable. Adrenals/Urinary Tract: Negative adrenals. No hydronephrosis or stone. Unremarkable bladder. Stomach/Bowel: No obstruction. No visible bowel inflammation. High-density in the stomach and rectum attributed to ingested material. Colonic diverticulosis. Vascular/Lymphatic: No acute vascular abnormality. Extensive atheromatous calcification. No mass or adenopathy. Reproductive:No pathologic findings. Other: No ascites or pneumoperitoneum. Musculoskeletal: Advanced lumbar spine degeneration with mild scoliosis. Superior endplate fracture with mild height loss at L3, favored subacute. IMPRESSION: 1. Atelectasis or pneumonia in the dependent lungs, mild. 2. Rectal fluid level correlating with history of diarrhea. No visible bowel wall inflammation. 3. L3 superior endplate fracture with mild height loss, favor subacute timing. 4. Atherosclerosis including the coronary arteries. Electronically Signed   By: JJorje GuildM.D.   On: 12/08/2021 06:34   CT Chest W Contrast  Result Date: 12/08/2021 CLINICAL DATA:  Abnormal x-ray/lung nodule. Nausea, vomiting, and diarrhea since early morning. Ethanol withdrawal. EXAM: CT CHEST,  ABDOMEN, AND PELVIS WITH CONTRAST TECHNIQUE: Multidetector CT imaging of the chest, abdomen and pelvis was performed following  the standard protocol during bolus administration of intravenous contrast. RADIATION DOSE REDUCTION: This exam was performed according to the departmental dose-optimization program which includes automated exposure control, adjustment of the mA and/or kV according to patient size and/or use of iterative reconstruction technique. CONTRAST:  116m OMNIPAQUE IOHEXOL 300 MG/ML  SOLN COMPARISON:  None Available. FINDINGS: CT CHEST FINDINGS Cardiovascular: Normal heart size. No pericardial effusion. Atheromatous calcification of the aorta and coronaries. No acute vascular finding Mediastinum/Nodes: No hematoma or adenopathy Lungs/Pleura: Dependent pulmonary opacity which could be atelectasis or pneumonia. Mild subpleural reticulation which could be atelectasis or mild interstitial lung disease, no honeycombing. Small pleural based calcification in the upper and posterior right chest; no generalized calcified pleural plaques. There is some pleural based thickening along the lateral left chest wall which is non worrisome fat density on 2:27 Musculoskeletal: Generalized thoracic spine degeneration. Notably severe right glenohumeral osteoarthritis. No acute finding. CT ABDOMEN PELVIS FINDINGS Hepatobiliary: No focal liver abnormality.No evidence of biliary obstruction or stone. Pancreas: Unremarkable. Spleen: Unremarkable. Adrenals/Urinary Tract: Negative adrenals. No hydronephrosis or stone. Unremarkable bladder. Stomach/Bowel: No obstruction. No visible bowel inflammation. High-density in the stomach and rectum attributed to ingested material. Colonic diverticulosis. Vascular/Lymphatic: No acute vascular abnormality. Extensive atheromatous calcification. No mass or adenopathy. Reproductive:No pathologic findings. Other: No ascites or pneumoperitoneum. Musculoskeletal: Advanced lumbar spine  degeneration with mild scoliosis. Superior endplate fracture with mild height loss at L3, favored subacute. IMPRESSION: 1. Atelectasis or pneumonia in the dependent lungs, mild. 2. Rectal fluid level correlating with history of diarrhea. No visible bowel wall inflammation. 3. L3 superior endplate fracture with mild height loss, favor subacute timing. 4. Atherosclerosis including the coronary arteries. Electronically Signed   By: JJorje GuildM.D.   On: 12/08/2021 06:34   DG Chest 2 View  Result Date: 12/08/2021 CLINICAL DATA:  Suspected sepsis. EXAM: CHEST - 2 VIEW COMPARISON:  Portable chest 11/14/2021 FINDINGS: Provided are AP and lateral views. There are multiple overlying monitor wires. The heart is slightly enlarged. There is mild perihilar vascular congestion. There is mild interstitial edema in the lung bases and trace pleural effusions. There is a low inspiration. There are patchy hazy opacities medially in the lower lung fields which could be atelectasis, ground-glass edema, pneumonia or combination. The upper lung zones are clear of focal infiltrate. The mediastinum is normally outlined. Osteopenia and mild thoracic dextroscoliosis. IMPRESSION: 1. Mild cardiomegaly, vascular congestion and mild interstitial edema. 2. Patchy hazy opacities medially in the lower lung fields which could be atelectasis, ground-glass edema, pneumonia or combination. 3. Clinical correlation and radiographic follow-up recommended. Electronically Signed   By: KTelford NabM.D.   On: 12/08/2021 05:36   CT ELBOW RIGHT WO CONTRAST  Result Date: 11/17/2021 CLINICAL DATA:  Soft tissue infection suspected.  Elbow pain. EXAM: CT OF THE UPPER RIGHT EXTREMITY WITHOUT CONTRAST TECHNIQUE: Multidetector CT imaging of the right elbow was performed according to the standard protocol. RADIATION DOSE REDUCTION: This exam was performed according to the departmental dose-optimization program which includes automated exposure control,  adjustment of the mA and/or kV according to patient size and/or use of iterative reconstruction technique. COMPARISON:  Radiographs 11/16/2021 FINDINGS: Bones/Joint/Cartilage Examination was performed with the patient's elbow by his side. Resulting beam hardening artifact limits image quality. No evidence of acute fracture, dislocation or bone destruction. There are ring osteophytes of the radial neck. Osteophytes and subchondral cyst formation are present within the coronoid process. There is a small to moderate nonspecific elbow joint effusion. Ligaments Suboptimally assessed  by CT. Muscles and Tendons As evaluated by noncontrast CT, the muscles and tendons about the elbow appear unremarkable. Triceps and biceps tendons appear intact. Soft tissues Suspected soft tissue edema about the elbow, especially posteriorly and laterally. No focal fluid collection, foreign body or soft tissue emphysema identified on noncontrast imaging. IMPRESSION: 1. No evidence of acute fracture, dislocation or bone destruction. 2. Nonspecific elbow joint effusion which could be secondary to underlying arthritis. Septic arthritis not excluded. 3. Suspected soft tissue edema about the elbow, especially posteriorly and laterally, which could reflect superficial soft tissue infection (cellulitis). No focal fluid collection, foreign body or soft tissue emphysema identified on noncontrast imaging. Electronically Signed   By: Richardean Sale M.D.   On: 11/17/2021 13:55   DG Elbow 2 Views Right  Result Date: 11/16/2021 CLINICAL DATA:  Right elbow pain. EXAM: RIGHT ELBOW - 2 VIEW COMPARISON:  None Available. FINDINGS: Examination is significantly degraded due to lack a lateral projection radiograph. Given this limitation, no definite displaced fractures identified with special attention paid to the radial head. Nonspecific diffuse soft tissue swelling about the elbow. No radiopaque foreign body. Nondiagnostic evaluation for a joint effusion.  Mild degenerative change of the ulnar humeral joint is suspected though incompletely evaluated. IMPRESSION: 1. Degraded examination secondary to lack of a lateral projection radiograph. 2. Nonspecific diffuse soft tissue swelling about the elbow without displaced fracture with special attention paid to the radial head. If clinical concern persists, further evaluation with elbow CT could be performed as indicated. 3. Mild degenerative change of the ulnar humeral joint is suspected though incompletely evaluated. Electronically Signed   By: Sandi Mariscal M.D.   On: 11/16/2021 12:43   EEG adult  Result Date: 11/15/2021 Lora Havens, MD     11/15/2021  6:04 PM Patient Name: YUVAL RUBENS MRN: [id number] Epilepsy Attending: Lora Havens Referring Physician/Provider: Marcelyn Bruins, MD Date: 11/15/2021 Duration: 25.25 mins Patient history: 86yo M with ams. EEG to evaluate for seizure Level of alertness: Awake AEDs during EEG study: None Technical aspects: This EEG study was done with scalp electrodes positioned according to the 10-20 International system of electrode placement. Electrical activity was reviewed with band pass filter of 1-'70Hz'$ , sensitivity of 7 uV/mm, display speed of 73m/sec with a '60Hz'$  notched filter applied as appropriate. EEG data were recorded continuously and digitally stored.  Video monitoring was available and reviewed as appropriate. Description: The posterior dominant rhythm consists of 9 Hz activity of moderate voltage (25-35 uV) seen predominantly in posterior head regions, symmetric and reactive to eye opening and eye closing. EEG showed intermittent generalized 3 to 6 Hz theta-delta slowing. Hyperventilation and photic stimulation were not performed.   ABNORMALITY - Intermittent slow, generalized IMPRESSION: This study is suggestive of mild diffuse encephalopathy, nonspecific etiology. No seizures or epileptiform discharges were seen throughout the recording. PLora Havens  ECHOCARDIOGRAM COMPLETE  Result Date: 11/15/2021    ECHOCARDIOGRAM REPORT   Patient Name:   FJAMALL STROHMEIERDate of Exam: 11/15/2021 Medical Rec #:  [id number]            Height:       64.0 in Accession #:    [id number]           Weight:       157.0 lb Date of Birth:  402-17-1937             BSA:          1.765  m Patient Age:    20 years              BP:           143/89 mmHg Patient Gender: M                     HR:           120 bpm. Exam Location:  Inpatient Procedure: 2D Echo, Cardiac Doppler and Color Doppler Indications:    Stroke I63.9  History:        Patient has no prior history of Echocardiogram examinations.  Sonographer:    Bernadene Person RDCS Referring Phys: [id number] [place]  1. Left ventricular ejection fraction, by estimation, is 65 to 70%. The left ventricle has normal function. The left ventricle has no regional wall motion abnormalities. Indeterminate diastolic filling due to E-A fusion.  2. Right ventricular systolic function is normal. The right ventricular size is normal. There is normal pulmonary artery systolic pressure. The estimated right ventricular systolic pressure is 45.4 mmHg.  3. The mitral valve is grossly normal. Trivial mitral valve regurgitation. No evidence of mitral stenosis.  4. The aortic valve is tricuspid. There is mild calcification of the aortic valve. There is mild thickening of the aortic valve. Aortic valve regurgitation is not visualized. Aortic valve sclerosis is present, with no evidence of aortic valve stenosis.  5. The inferior vena cava is normal in size with greater than 50% respiratory variability, suggesting right atrial pressure of 3 mmHg. Conclusion(s)/Recommendation(s): No intracardiac source of embolism detected on this transthoracic study. Consider a transesophageal echocardiogram to exclude cardiac source of embolism if clinically indicated. FINDINGS  Left Ventricle: Left ventricular ejection fraction, by  estimation, is 65 to 70%. The left ventricle has normal function. The left ventricle has no regional wall motion abnormalities. The left ventricular internal cavity size was normal in size. There is  no left ventricular hypertrophy. Indeterminate diastolic filling due to E-A fusion. Right Ventricle: The right ventricular size is normal. No increase in right ventricular wall thickness. Right ventricular systolic function is normal. There is normal pulmonary artery systolic pressure. The tricuspid regurgitant velocity is 2.31 m/s, and  with an assumed right atrial pressure of 3 mmHg, the estimated right ventricular systolic pressure is 09.8 mmHg. Left Atrium: Left atrial size was normal in size. Right Atrium: Right atrial size was normal in size. Pericardium: There is no evidence of pericardial effusion. Mitral Valve: The mitral valve is grossly normal. Trivial mitral valve regurgitation. No evidence of mitral valve stenosis. Tricuspid Valve: The tricuspid valve is grossly normal. Tricuspid valve regurgitation is mild . No evidence of tricuspid stenosis. Aortic Valve: The aortic valve is tricuspid. There is mild calcification of the aortic valve. There is mild thickening of the aortic valve. Aortic valve regurgitation is not visualized. Aortic valve sclerosis is present, with no evidence of aortic valve stenosis. Pulmonic Valve: The pulmonic valve was grossly normal. Pulmonic valve regurgitation is trivial. No evidence of pulmonic stenosis. Aorta: The aortic root and ascending aorta are structurally normal, with no evidence of dilitation. Venous: The inferior vena cava is normal in size with greater than 50% respiratory variability, suggesting right atrial pressure of 3 mmHg. IAS/Shunts: The atrial septum is grossly normal.  LEFT VENTRICLE PLAX 2D LVIDd:         3.70 cm LVIDs:         2.30 cm LV PW:  0.90 cm LV IVS:        1.10 cm LVOT diam:     2.00 cm LV SV:         26 LV SV Index:   15 LVOT Area:     3.14  cm  LV Volumes (MOD) LV vol d, MOD A2C: 57.8 ml LV vol d, MOD A4C: 70.0 ml LV vol s, MOD A2C: 23.1 ml LV vol s, MOD A4C: 27.8 ml LV SV MOD A2C:     34.7 ml LV SV MOD A4C:     70.0 ml LV SV MOD BP:      36.6 ml RIGHT VENTRICLE RV S prime:     13.20 cm/s TAPSE (M-mode): 2.3 cm LEFT ATRIUM             Index        RIGHT ATRIUM           Index LA diam:        2.40 cm 1.36 cm/m   RA Area:     13.40 cm LA Vol (A2C):   19.4 ml 10.99 ml/m  RA Volume:   28.30 ml  16.03 ml/m LA Vol (A4C):   24.8 ml 14.05 ml/m LA Biplane Vol: 24.1 ml 13.65 ml/m  AORTIC VALVE LVOT Vmax:   69.47 cm/s LVOT Vmean:  44.500 cm/s LVOT VTI:    0.084 m  AORTA Ao Root diam: 3.50 cm Ao Asc diam:  3.50 cm TRICUSPID VALVE TR Peak grad:   21.3 mmHg TR Vmax:        231.00 cm/s  SHUNTS Systemic VTI:  0.08 m Systemic Diam: 2.00 cm Eleonore Chiquito MD Electronically signed by Eleonore Chiquito MD Signature Date/Time: 11/15/2021/9:28:56 AM    Final    MR MRA NECK W CONTRAST  Result Date: 11/15/2021 CLINICAL DATA:  Stroke follow-up EXAM: MRA NECK WITHOUT AND WITH CONTRAST TECHNIQUE: Multiplanar and multiecho pulse sequences of the neck were obtained without and with intravenous contrast. Angiographic images of the neck were obtained using MRA technique without and with intravenous contrast. CONTRAST:  48m GADAVIST GADOBUTROL 1 MMOL/ML IV SOLN COMPARISON:  None Available. FINDINGS: Aortic arch: Normal 3 vessel branching pattern. Right carotid system: No occlusion or stenosis. Left carotid system: No occlusion or stenosis. Vertebral arteries: Codominant system. No occlusion or stenosis. Both vertebral arteries originate from the subclavian arteries. Normal configuration of the vertebrobasilar confluence. IMPRESSION: Normal MRA neck Electronically Signed   By: KUlyses JarredM.D.   On: 11/15/2021 00:54   DG CHEST PORT 1 VIEW  Result Date: 11/15/2021 CLINICAL DATA:  Encephalopathy. EXAM: PORTABLE CHEST 1 VIEW COMPARISON:  Chest radiograph dated 05/17/2020.  FINDINGS: There is left lung base atelectasis or infiltrate. Bilateral perihilar densities may represent mild congestion. No pleural effusion pneumothorax. The cardiac silhouette is within limits. Atherosclerotic calcification of the aorta. No acute osseous pathology. IMPRESSION: Left lung base atelectasis or infiltrate. Electronically Signed   By: AAnner CreteM.D.   On: 11/15/2021 00:02   MR ANGIO HEAD WO CONTRAST  Result Date: 11/14/2021 CLINICAL DATA:  Acute neurologic deficit EXAM: MRA HEAD WITHOUT CONTRAST TECHNIQUE: Angiographic images of the Circle of Willis were acquired using MRA technique without intravenous contrast. COMPARISON:  None Available. FINDINGS: POSTERIOR CIRCULATION: --Vertebral arteries: Normal --Inferior cerebellar arteries: Normal. --Basilar artery: Normal. --Superior cerebellar arteries: Normal. --Posterior cerebral arteries: Normal. ANTERIOR CIRCULATION: --Intracranial internal carotid arteries: Normal. --Anterior cerebral arteries (ACA): Normal. --Middle cerebral arteries (MCA): Normal. ANATOMIC VARIANTS: None IMPRESSION: Normal intracranial MRA.  Electronically Signed   By: Ulyses Jarred M.D.   On: 11/14/2021 23:46   MR BRAIN WO CONTRAST  Result Date: 11/14/2021 CLINICAL DATA:  Follow-up questionable pontine lesion seen EXAM: MRI HEAD WITHOUT CONTRAST TECHNIQUE: Multiplanar, multiecho pulse sequences of the brain and surrounding structures were obtained without intravenous contrast. COMPARISON:  Same-day CT head, brain MRI 05/18/2020 FINDINGS: Brain: There is a small focus of diffusion restriction in the right corona radiata with associated FLAIR signal abnormality consistent with small acute infarct. There is no associated hemorrhage or mass effect. There is no other evidence of acute infarct. Specifically, there is no acute abnormality in the pons. There is no acute intracranial hemorrhage or extra-axial fluid collection. There is unchanged background parenchymal volume  loss with prominence of the ventricular system and extra-axial CSF spaces. FLAIR signal abnormality in the supratentorial white matter is nonspecific but likely reflects sequela of chronic small vessel ischemic change, unchanged since the prior MRI from 2022. Punctate chronic microhemorrhages in the right frontal lobe are unchanged (10-46). There is no solid mass lesion. There is no mass effect or midline shift. Vascular: Normal flow voids. Skull and upper cervical spine: Normal marrow signal. Sinuses/Orbits: The paranasal sinuses are clear. Bilateral lens implants are in place. The globes and orbits are otherwise unremarkable. Other: None. IMPRESSION: 1. Small acute infarct in the right corona radiata without hemorrhage or mass effect. 2. No other acute intracranial pathology. No acute finding in the pons as questioned on the prior CT. Electronically Signed   By: Valetta Mole M.D.   On: 11/14/2021 19:42   CT Head Wo Contrast  Result Date: 11/14/2021 CLINICAL DATA:  Altered mental status EXAM: CT HEAD WITHOUT CONTRAST TECHNIQUE: Contiguous axial images were obtained from the base of the skull through the vertex without intravenous contrast. RADIATION DOSE REDUCTION: This exam was performed according to the departmental dose-optimization program which includes automated exposure control, adjustment of the mA and/or kV according to patient size and/or use of iterative reconstruction technique. COMPARISON:  CT head 05/17/2020 FINDINGS: Brain: No evidence of hemorrhage, extra-axial collection or mass lesion/mass effect. There is ventriculomegaly which is likely related to the degree of volume loss. There is a new focal hypodense lesion in the upper pons (series 2, image 13). Sequela of severe chronic microvascular ischemic change and advanced generalized volume loss. Vascular: No hyperdense vessel or unexpected calcification. Skull: Normal. Negative for fracture or focal lesion. Sinuses/Orbits: Bilateral lens  replacement. Other: None. IMPRESSION: New focal hypodense lesion in the ventral pons on the left may be artifactual or represent an age-indeterminate infarct. Recommend MRI for further evaluation. Electronically Signed   By: Marin Roberts M.D.   On: 11/14/2021 16:34     EKG None  Radiology CT ABDOMEN PELVIS W CONTRAST  Result Date: 12/08/2021 CLINICAL DATA:  Abnormal x-ray/lung nodule. Nausea, vomiting, and diarrhea since early morning. Ethanol withdrawal. EXAM: CT CHEST, ABDOMEN, AND PELVIS WITH CONTRAST TECHNIQUE: Multidetector CT imaging of the chest, abdomen and pelvis was performed following the standard protocol during bolus administration of intravenous contrast. RADIATION DOSE REDUCTION: This exam was performed according to the departmental dose-optimization program which includes automated exposure control, adjustment of the mA and/or kV according to patient size and/or use of iterative reconstruction technique. CONTRAST:  145m OMNIPAQUE IOHEXOL 300 MG/ML  SOLN COMPARISON:  None Available. FINDINGS: CT CHEST FINDINGS Cardiovascular: Normal heart size. No pericardial effusion. Atheromatous calcification of the aorta and coronaries. No acute vascular finding Mediastinum/Nodes: No hematoma or adenopathy Lungs/Pleura: Dependent  pulmonary opacity which could be atelectasis or pneumonia. Mild subpleural reticulation which could be atelectasis or mild interstitial lung disease, no honeycombing. Small pleural based calcification in the upper and posterior right chest; no generalized calcified pleural plaques. There is some pleural based thickening along the lateral left chest wall which is non worrisome fat density on 2:27 Musculoskeletal: Generalized thoracic spine degeneration. Notably severe right glenohumeral osteoarthritis. No acute finding. CT ABDOMEN PELVIS FINDINGS Hepatobiliary: No focal liver abnormality.No evidence of biliary obstruction or stone. Pancreas: Unremarkable. Spleen: Unremarkable.  Adrenals/Urinary Tract: Negative adrenals. No hydronephrosis or stone. Unremarkable bladder. Stomach/Bowel: No obstruction. No visible bowel inflammation. High-density in the stomach and rectum attributed to ingested material. Colonic diverticulosis. Vascular/Lymphatic: No acute vascular abnormality. Extensive atheromatous calcification. No mass or adenopathy. Reproductive:No pathologic findings. Other: No ascites or pneumoperitoneum. Musculoskeletal: Advanced lumbar spine degeneration with mild scoliosis. Superior endplate fracture with mild height loss at L3, favored subacute. IMPRESSION: 1. Atelectasis or pneumonia in the dependent lungs, mild. 2. Rectal fluid level correlating with history of diarrhea. No visible bowel wall inflammation. 3. L3 superior endplate fracture with mild height loss, favor subacute timing. 4. Atherosclerosis including the coronary arteries. Electronically Signed   By: Jorje Guild M.D.   On: 12/08/2021 06:34   CT Chest W Contrast  Result Date: 12/08/2021 CLINICAL DATA:  Abnormal x-ray/lung nodule. Nausea, vomiting, and diarrhea since early morning. Ethanol withdrawal. EXAM: CT CHEST, ABDOMEN, AND PELVIS WITH CONTRAST TECHNIQUE: Multidetector CT imaging of the chest, abdomen and pelvis was performed following the standard protocol during bolus administration of intravenous contrast. RADIATION DOSE REDUCTION: This exam was performed according to the departmental dose-optimization program which includes automated exposure control, adjustment of the mA and/or kV according to patient size and/or use of iterative reconstruction technique. CONTRAST:  1107m OMNIPAQUE IOHEXOL 300 MG/ML  SOLN COMPARISON:  None Available. FINDINGS: CT CHEST FINDINGS Cardiovascular: Normal heart size. No pericardial effusion. Atheromatous calcification of the aorta and coronaries. No acute vascular finding Mediastinum/Nodes: No hematoma or adenopathy Lungs/Pleura: Dependent pulmonary opacity which could be  atelectasis or pneumonia. Mild subpleural reticulation which could be atelectasis or mild interstitial lung disease, no honeycombing. Small pleural based calcification in the upper and posterior right chest; no generalized calcified pleural plaques. There is some pleural based thickening along the lateral left chest wall which is non worrisome fat density on 2:27 Musculoskeletal: Generalized thoracic spine degeneration. Notably severe right glenohumeral osteoarthritis. No acute finding. CT ABDOMEN PELVIS FINDINGS Hepatobiliary: No focal liver abnormality.No evidence of biliary obstruction or stone. Pancreas: Unremarkable. Spleen: Unremarkable. Adrenals/Urinary Tract: Negative adrenals. No hydronephrosis or stone. Unremarkable bladder. Stomach/Bowel: No obstruction. No visible bowel inflammation. High-density in the stomach and rectum attributed to ingested material. Colonic diverticulosis. Vascular/Lymphatic: No acute vascular abnormality. Extensive atheromatous calcification. No mass or adenopathy. Reproductive:No pathologic findings. Other: No ascites or pneumoperitoneum. Musculoskeletal: Advanced lumbar spine degeneration with mild scoliosis. Superior endplate fracture with mild height loss at L3, favored subacute. IMPRESSION: 1. Atelectasis or pneumonia in the dependent lungs, mild. 2. Rectal fluid level correlating with history of diarrhea. No visible bowel wall inflammation. 3. L3 superior endplate fracture with mild height loss, favor subacute timing. 4. Atherosclerosis including the coronary arteries. Electronically Signed   By: JJorje GuildM.D.   On: 12/08/2021 06:34   DG Chest 2 View  Result Date: 12/08/2021 CLINICAL DATA:  Suspected sepsis. EXAM: CHEST - 2 VIEW COMPARISON:  Portable chest 11/14/2021 FINDINGS: Provided are AP and lateral views. There are multiple overlying monitor wires. The  heart is slightly enlarged. There is mild perihilar vascular congestion. There is mild interstitial edema  in the lung bases and trace pleural effusions. There is a low inspiration. There are patchy hazy opacities medially in the lower lung fields which could be atelectasis, ground-glass edema, pneumonia or combination. The upper lung zones are clear of focal infiltrate. The mediastinum is normally outlined. Osteopenia and mild thoracic dextroscoliosis. IMPRESSION: 1. Mild cardiomegaly, vascular congestion and mild interstitial edema. 2. Patchy hazy opacities medially in the lower lung fields which could be atelectasis, ground-glass edema, pneumonia or combination. 3. Clinical correlation and radiographic follow-up recommended. Electronically Signed   By: Telford Nab M.D.   On: 12/08/2021 05:36    Procedures Procedures    Medications Ordered in ED Medications  lactated ringers infusion ( Intravenous New Bag/Given 12/08/21 0624)  acetaminophen (TYLENOL) tablet 650 mg (has no administration in time range)    Or  acetaminophen (TYLENOL) suppository 650 mg (has no administration in time range)  ceFEPIme (MAXIPIME) 2 g in sodium chloride 0.9 % 100 mL IVPB (0 g Intravenous Stopped 12/08/21 0635)  metroNIDAZOLE (FLAGYL) IVPB 500 mg (0 mg Intravenous Stopped 12/08/21 0715)  vancomycin (VANCOCIN) IVPB 1000 mg/200 mL premix (0 mg Intravenous Stopped 12/08/21 0715)  acetaminophen (TYLENOL) suppository 650 mg (650 mg Rectal Given 12/08/21 0501)  lactated ringers bolus 1,000 mL (1,000 mLs Intravenous Bolus 12/08/21 0526)  ondansetron (ZOFRAN) injection 4 mg (4 mg Intravenous Given 12/08/21 0625)  iohexol (OMNIPAQUE) 300 MG/ML solution 100 mL (100 mLs Intravenous Contrast Given 12/08/21 0606)  sodium chloride (PF) 0.9 % injection (  Given by Other 12/08/21 0627)    EKG Interpretation  Date/Time:  Thursday December 08 2021 04:34:04 EST Ventricular Rate:  133 PR Interval:  160 QRS Duration: 83 QT Interval:  292 QTC Calculation: 435 R Axis:   -76 Text Interpretation: Sinus tachycardia Multiform  ventricular premature complexes LAE, consider biatrial enlargement Left anterior fascicular block Abnormal R-wave progression, late transition Borderline T wave abnormalities Confirmed by Dory Horn) on 12/08/2021 7:26:04 AM          ED Course/ Medical Decision Making/ A&P                           Medical Decision Making Patient with cough and nausea vomiting and diarrhea   Amount and/or Complexity of Data Reviewed Independent Historian: EMS    Details: See above  External Data Reviewed: labs and notes.    Details: Previous admission notes and labs reviewed  Labs: ordered.    Details: All labs reviewed: negative covid and flu.  Negative urine.  Elevated white count 14.2, normal hemoglobin 14.9, normal platelets. Normal coags. Elevated lactate 2.  Normal sodium 136, normal potassium, normal creatinine .57 Radiology: ordered and independent interpretation performed.    Details: PNA by me on CXR ECG/medicine tests: ordered and independent interpretation performed. Decision-making details documented in ED Course.  Risk OTC drugs. Prescription drug management. Decision regarding hospitalization.  Critical Care Total time providing critical care: 60 minutes    CRITICAL CARE Performed by: Hanifa Antonetti K Leshawn Straka-Rasch Total critical care time: 60 minutes Critical care time was exclusive of separately billable procedures and treating other patients. Critical care was necessary to treat or prevent imminent or life-threatening deterioration. Critical care was time spent personally by me on the following activities: development of treatment plan with patient and/or surrogate as well as nursing, discussions with consultants, evaluation of patient's response to treatment,  examination of patient, obtaining history from patient or surrogate, ordering and performing treatments and interventions, ordering and review of laboratory studies, ordering and review of radiographic studies, pulse  oximetry and re-evaluation of patient's condition.  The patient appears reasonably stabilized for admission considering the current resources, flow, and capabilities available in the ED at this time, and I doubt any other [facility] requiring further screening and/or treatment in the ED prior to admission.  Final Clinical Impression(s) / ED Diagnoses Final diagnoses:  Sepsis, due to unspecified organism, unspecified whether acute organ dysfunction present ([place])  Nausea vomiting and diarrhea    Rx / DC Orders ED Discharge Orders     None         Calandria Mullings, MD 12/08/21 [phone]

## 2021-12-08 NOTE — Sepsis Progress Note (Signed)
Elink monitoring for the code sepsis protocol.  

## 2021-12-08 NOTE — Progress Notes (Signed)
  Transition of Care [facility]) Screening Note   Patient Details  Name: Jose [name] Date of Birth: 24-Jan-1935   Transition of Care [facility]) CM/SW Contact:    Dessa Phi, RN Phone Number: 12/08/2021, 3:30 PM    Transition of Care Department [facility]) has reviewed patient and no TOC needs have been identified at this time. We will continue to monitor patient advancement through interdisciplinary progression rounds. If new patient transition needs arise, please place a TOC consult.

## 2021-12-08 NOTE — H&P (Signed)
History and Physical    Patient: Jose [name] SWH:[id number] DOB: 07-31-1935 DOA: 12/08/2021 DOS: the patient was seen and examined on 12/08/2021 PCP: Hoyt Koch, MD  Patient coming from: Home  Chief Complaint:  Chief Complaint  Patient presents with   Nausea   Emesis   HPI: Jose [name] is a 86 y.o. male with medical history significant of right knee acute meniscal tears, osteoarthritis, urticaria, IBS, mild sleep apnea not on CPAP, neuromuscular system disorder, spinal stenosis, GERD, chronic fatigue syndrome, alcohol abuse, thiamine deficiency who was recently admitted in the discharged to inpatient rehab and then to outpatient SNF due to acute CVA who was sent to the emergency department due to nausea, vomiting, diarrhea and fever that started around 0200.  The patient is still mildly confused.  He was apprised when he was told he was in the hospital.  He is partially oriented to date and situation.  He answers simple questions.  No headache, abdominal, back or chest pain at this time.  ED course: Initial vital signs were temperature 103.9 F, pulse 133, respiration 22, BP 147/77 and O2 sat 88% on room air.  The patient received 650 mg of acetaminophen PR, LR 1000 mL bolus, cefepime, metronidazole and vancomycin.  Lab work: Urinalysis with ketonuria of 20 mg deciliter but are otherwise normal.  His CBC showed a white count of 14.2 with 89% neutrophils, hemoglobin 14.9 g/dL and platelets 200.  Normal PT, INR and PTT.  Lactic acid initially 2.0 then 0.9 mmol/L.  Coronavirus influenza PCR was negative.  CMP with a chloride of 97 and glucose of 125.  The rest of the CMP was normal.  Imaging: Two-view chest radiograph with mild cardiomegaly, vascular congestion my interstitial edema.  There are patchy hazy opacities medially in the lower lung fields which could be atelectasis or groundglass edema, pneumonia or combination.  CT abdomen/pelvis with contrast showed normal  heart size.  No pericardial effusion.  No acute vascular finding.  There was atelectasis or pneumonia in the dependent lungs.  Rectal fluid level correlating with history of diarrhea.  L3 superior endplate fracture with mild height loss likely subacute.  Aortic and coronary artery atherosclerosis.   Review of Systems: As mentioned in the history of present illness. All other systems reviewed and are negative.  Past Medical History:  Diagnosis Date   Acute meniscal tear of knee    RIGHT KNEE   Arthritis    Hives    PER PT UNKNOWN WHY   IBS (irritable bowel syndrome)    Mild sleep apnea    PER STUDY  11-30-2010--  NO CPAP   Neuromuscular disorder ([place])    Spinal stenosis    Past Surgical History:  Procedure Laterality Date   APPENDECTOMY     KNEE ARTHROSCOPY WITH LATERAL MENISECTOMY Right 04/12/2012   Procedure: RIGHT KNEE ARTHROSCOPY WITH PARTIAL MEDIAL AND LATERAL MENISECTOMY;  Surgeon: Magnus Sinning, MD;  Location: [facility];  Service: Orthopedics;  Laterality: Right;   SHOULDER ARTHROSCOPY WITH ROTATOR CUFF REPAIR AND SUBACROMIAL DECOMPRESSION  12-21-1999  &  01-28-2004   Social History:  reports that he has quit smoking. His smoking use included cigarettes. He has never used smokeless tobacco. He reports current alcohol use. He reports that he does not use drugs.  No Known Allergies  Family History  Problem Relation Age of Onset   Heart disease Mother    Heart disease Father     Prior to Admission medications  Medication Sig Start Date End Date Taking? Authorizing Provider  acetaminophen (TYLENOL) 325 MG tablet Take 1-2 tablets (325-650 mg total) by mouth every 6 (six) hours as needed for mild pain. 11/28/21  Yes Setzer, Edman Circle, PA-C  ALPRAZolam Duanne Moron) 0.5 MG tablet Take 0.5 mg by mouth at bedtime as needed for anxiety.   Yes [provider]  ascorbic acid (VITAMIN C) 500 MG tablet Take 1 tablet (500 mg total) by mouth daily. 11/28/21  Yes  Setzer, Edman Circle, PA-C  aspirin 81 MG chewable tablet Chew 1 tablet (81 mg total) by mouth daily. 11/28/21  Yes Setzer, Edman Circle, PA-C  atorvastatin (LIPITOR) 40 MG tablet Take 1 tablet (40 mg total) by mouth daily. 11/28/21  Yes Setzer, Edman Circle, PA-C  bisacodyl (DULCOLAX) 5 MG EC tablet Take 1 tablet (5 mg total) by mouth daily as needed for moderate constipation. 11/28/21  Yes Setzer, Edman Circle, PA-C  citalopram (CELEXA) 20 MG tablet Take 1 tablet (20 mg total) by mouth daily. 11/29/21  Yes Setzer, Edman Circle, PA-C  docusate sodium (COLACE) 100 MG capsule Take 1 capsule (100 mg total) by mouth 2 (two) times daily as needed for mild constipation. 11/28/21  Yes Setzer, Edman Circle, PA-C  thiamine (VITAMIN B-1) 100 MG tablet Take 1 tablet (100 mg total) by mouth daily. 11/29/21  Yes Setzer, Edman Circle, PA-C  Multiple Vitamin (MULTIVITAMIN WITH MINERALS) TABS tablet Take 1 tablet by mouth daily. Patient not taking: Reported on 11/30/2021 11/28/21   Vaughan Basta, Edman Circle, PA-C  omeprazole (PRILOSEC) 20 MG capsule Take 1 capsule (20 mg total) by mouth every morning. Patient not taking: Reported on 11/30/2021 11/28/21   Barbie Banner, PA-C  oxybutynin (DITROPAN XL) 10 MG 24 hr tablet Take 1 tablet (10 mg total) by mouth at bedtime. Patient not taking: Reported on 11/30/2021 11/28/21   Barbie Banner, PA-C  traZODone (DESYREL) 100 MG tablet Take 1 tablet (100 mg total) by mouth at bedtime. Patient not taking: Reported on 11/30/2021 11/28/21   Barbie Banner, PA-C    Physical Exam: Vitals:   12/08/21 0820 12/08/21 0830 12/08/21 0845 12/08/21 0900  BP:  110/76 (!) 103/53 91/60  Pulse: (!) 114 (!) 114 (!) 114 (!) 113  Resp: (!) 21 (!) 21 (!) 21 20  Temp:      TempSrc:      SpO2: 90% (!) 88% (!) 87% 94%   Physical Exam Vitals and nursing note reviewed.  Constitutional:      Appearance: Normal appearance.  HENT:     Head: Normocephalic.     Nose: No rhinorrhea.     Mouth/Throat:     Mouth: Mucous membranes are  moist.  Eyes:     General: No scleral icterus.    Pupils: Pupils are equal, round, and reactive to light.  Cardiovascular:     Rate and Rhythm: Normal rate and regular rhythm.  Pulmonary:     Effort: Pulmonary effort is normal.     Breath sounds: Rhonchi and rales present. No wheezing.  Abdominal:     General: Bowel sounds are normal. There is no distension.     Palpations: Abdomen is soft.     Tenderness: There is no abdominal tenderness. There is no guarding.  Musculoskeletal:     Cervical back: Neck supple.     Right lower leg: No edema.     Left lower leg: No edema.  Skin:    General: Skin is warm and dry.  Neurological:  General: No focal deficit present.     Mental Status: He is alert. He is disoriented.  Psychiatric:        Mood and Affect: Mood normal.   Data Reviewed:  There are no new results to review at this time.  Assessment and Plan: Principal Problem:   Acute metabolic encephalopathy In the setting of:   Severe sepsis (HCC) Leukocytosis, fever, lactic acidosis. Source of infection is:   CAP (community acquired pneumonia) Admit to PCU/inpatient. Continue supplemental oxygen. Scheduled and as needed bronchodilators. Begin ceftriaxone 1 g IVPB daily. Begin azithromycin 500 mg IVPB daily. Check strep pneumoniae urinary antigen. Check sputum Gram stain, culture and sensitivity. Follow-up blood culture and sensitivity. Follow-up CBC and chemistry in the morning.  Active Problems:   Thiamine deficiency Continue thiamine supplementation.    GERD (gastroesophageal reflux disease) Resume PPI.    Hyperglycemia Check fasting glucose in the morning.    History of CVA (cerebrovascular accident)  Continue aspirin and atorvastatin. Supportive care.    Hyperlipidemia On atorvastatin 40 mg daily.   Advance Care Planning:   Code Status: Full Code   Consults:   Family Communication:   Severity of Illness: The appropriate patient status for this  patient is INPATIENT. Inpatient status is judged to be reasonable and necessary in order to provide the required intensity of service to ensure the patient's safety. The patient's presenting symptoms, physical exam findings, and initial radiographic and laboratory data in the context of their chronic comorbidities is felt to place them at high risk for further clinical deterioration. Furthermore, it is not anticipated that the patient will be medically stable for discharge from the hospital within 2 midnights of admission.   * I certify that at the point of admission it is my clinical judgment that the patient will require inpatient hospital care spanning beyond 2 midnights from the point of admission due to high intensity of service, high risk for further deterioration and high frequency of surveillance required.*  Author: Reubin Milan, MD 12/08/2021 9:28 AM  For on call review [email or web address].   This document was prepared using Dragon voice recognition software and may contain some unintended transcription errors.

## 2021-12-08 NOTE — Progress Notes (Signed)
A consult was received from an ED physician for vanc and cefepime per pharmacy dosing.  The patient's profile has been reviewed for ht/wt/allergies/indication/available labs.   A one time order has been placed for vanc 1 gm and cefepime 2gm.    Further antibiotics/pharmacy consults should be ordered by admitting physician if indicated.                       Thank you, Dolly Rias RPh 12/08/2021, 4:43 AM

## 2021-12-08 NOTE — ED Notes (Signed)
Sleeping, arousable to voice, denies pain, pt is oriented, updated, zithromax and IVF infusing, VSS.

## 2021-12-09 DIAGNOSIS — A419 Sepsis, unspecified organism: Secondary | ICD-10-CM | POA: Diagnosis not present

## 2021-12-09 DIAGNOSIS — E440 Moderate protein-calorie malnutrition: Secondary | ICD-10-CM | POA: Insufficient documentation

## 2021-12-09 DIAGNOSIS — R6520 Severe sepsis without septic shock: Secondary | ICD-10-CM | POA: Diagnosis not present

## 2021-12-09 LAB — BLOOD CULTURE ID PANEL (REFLEXED) - BCID2

## 2021-12-09 LAB — COMPREHENSIVE METABOLIC PANEL
ALT: 16 U/L (ref 0–44)
AST: 17 U/L (ref 15–41)
Albumin: 2.7 g/dL — ABNORMAL LOW (ref 3.5–5.0)
Alkaline Phosphatase: 51 U/L (ref 38–126)
Anion gap: 5 (ref 5–15)
BUN: 11 mg/dL (ref 8–23)
CO2: 27 mmol/L (ref 22–32)
Calcium: 9.1 mg/dL (ref 8.9–10.3)
Chloride: 107 mmol/L (ref 98–111)
Creatinine, Ser: 0.8 mg/dL (ref 0.61–1.24)
GFR, Estimated: >60 mL/min (ref >60)
Glucose, Bld: 89 mg/dL (ref 70–99)
Potassium: 3.6 mmol/L (ref 3.5–5.1)
Sodium: 139 mmol/L (ref 135–145)
Total Bilirubin: 0.6 mg/dL (ref 0.3–1.2)
Total Protein: 5.5 g/dL — ABNORMAL LOW (ref 6.5–8.1)

## 2021-12-09 LAB — CBC
HCT: 34.2 % — ABNORMAL LOW (ref 39.0–52.0)
Hemoglobin: 11.2 g/dL — ABNORMAL LOW (ref 13.0–17.0)
MCH: 30 pg (ref 26.0–34.0)
MCHC: 32.7 g/dL (ref 30.0–36.0)
MCV: 91.7 fL (ref 80.0–100.0)
Platelets: 146 10*3/uL — ABNORMAL LOW (ref 150–400)
RBC: 3.73 MIL/uL — ABNORMAL LOW (ref 4.22–5.81)
RDW: 13.8 % (ref 11.5–15.5)
WBC: 6.1 10*3/uL (ref 4.0–10.5)
nRBC: 0 % (ref 0.0–0.2)

## 2021-12-09 LAB — URINE CULTURE
Culture: NO GROWTH
Special Requests: NORMAL

## 2021-12-09 MED ORDER — OXYCODONE-ACETAMINOPHEN 5-325 MG PO TABS
1.0000 | ORAL_TABLET | Freq: Four times a day (QID) | ORAL | Status: DC | PRN
  Administered 2021-12-09 – 2021-12-11 (×2): 1 via ORAL
  Filled 2021-12-09 (×2): qty 1

## 2021-12-09 MED ORDER — ADULT MULTIVITAMIN W/MINERALS CH
1.0000 | ORAL_TABLET | Freq: Every day | ORAL | Status: DC
  Administered 2021-12-10 – 2021-12-13 (×4): 1 via ORAL
  Filled 2021-12-09 (×4): qty 1

## 2021-12-09 MED ORDER — ORAL CARE MOUTH RINSE: 15.0000 mL | OROMUCOSAL | Status: DC | PRN

## 2021-12-09 MED ORDER — GUAIFENESIN ER 600 MG PO TB12
600.0000 mg | ORAL_TABLET | Freq: Two times a day (BID) | ORAL | Status: DC
  Administered 2021-12-09 – 2021-12-13 (×9): 600 mg via ORAL
  Filled 2021-12-09 (×9): qty 1

## 2021-12-09 MED ORDER — BOOST PLUS PO LIQD
237.0000 mL | Freq: Three times a day (TID) | ORAL | Status: DC
  Administered 2021-12-09 – 2021-12-11 (×6): 237 mL via ORAL
  Filled 2021-12-09 (×13): qty 237

## 2021-12-09 NOTE — Progress Notes (Signed)
Initial Nutrition Assessment  DOCUMENTATION CODES:   Non-severe (moderate) malnutrition in context of chronic illness  INTERVENTION:   -Boost Plus TID- Each supplement provides 360kcal and 14g protein.     -Multivitamin with minerals daily  NUTRITION DIAGNOSIS:   Moderate Malnutrition related to chronic illness (CVA) as evidenced by moderate fat depletion, moderate muscle depletion.  GOAL:   Patient will meet greater than or equal to 90% of their needs  MONITOR:   PO intake, Supplement acceptance, Labs, Weight trends, I & O's, Skin  REASON FOR ASSESSMENT:   Malnutrition Screening Tool    ASSESSMENT:   86 y.o. male with medical history significant of right knee acute meniscal tears, osteoarthritis, urticaria, IBS, mild sleep apnea not on CPAP, neuromuscular system disorder, spinal stenosis, GERD, chronic fatigue syndrome, alcohol abuse, thiamine deficiency who was recently admitted in the discharged to inpatient rehab and then to outpatient SNF due to acute CVA who was sent to the emergency department due to nausea, vomiting, diarrhea and fever  Patient in room, eating his breakfast of eggs, potatoes and grits. Pt denies any issues swallowing or chewing. Pt is a little confused when questioned and at times get irritable. Pt's friend visited and was able to provide some PO history. States he was hospitalized in October, was eating well during that admission, discharged 11/6. At home he began to have poor appetite. Would eat cheerios for breakfast and some soup later in the day. A few days he did eat some eggs and bacon. No supplements during this time.  Per pt's friend, 6 weeks ago pt was eating well and was seeing a Physiological scientist.  Per weight records, pt weighs ~151 lbs. Pt reports his UBW is ~150 lbs.  Medications: Thiamine  Labs reviewed.  NUTRITION - FOCUSED PHYSICAL EXAM:  Flowsheet Row Most Recent Value  Orbital Region Mild depletion  Upper Arm Region Severe  depletion  Thoracic and Lumbar Region Unable to assess  Buccal Region Moderate depletion  Temple Region Moderate depletion  Clavicle Bone Region Moderate depletion  Clavicle and Acromion Bone Region Moderate depletion  Scapular Bone Region Moderate depletion  Dorsal Hand Moderate depletion  Patellar Region Mild depletion  Anterior Thigh Region Mild depletion  Posterior Calf Region Mild depletion  Edema (RD Assessment) None  Hair Reviewed  [thin]  Eyes Reviewed  Mouth Reviewed  Skin Reviewed  Nails Reviewed       Diet Order:   Diet Order             Diet Heart Room service appropriate? Yes; Fluid consistency: Thin  Diet effective now                   EDUCATION NEEDS:   Education needs have been addressed  Skin:  Skin Assessment: Skin Integrity Issues: Skin Integrity Issues:: Stage I, Stage II Stage I: left buttocks Stage II: right buttocks  Last BM:  11/16  Height:   Ht Readings from Last 1 Encounters:  12/08/21 '5\' 10"'$  (1.778 m)    Weight:   Wt Readings from Last 1 Encounters:  12/09/21 68.7 kg    BMI:  Body mass index is 21.73 kg/m.  Estimated Nutritional Needs:   Kcal:  2000-2200  Protein:  95-105g  Fluid:  >2L/day   Clayton Bibles, MS, RD, LDN Inpatient Clinical Dietitian Contact information available via Amion

## 2021-12-09 NOTE — Progress Notes (Signed)
PROGRESS NOTE  Jose [name]  DOB: [birth year]  PCP: Hoyt Koch, MD GYF:[id number]  DOA: 12/08/2021  LOS: 1 day  Hospital Day: 2  Brief narrative: Jose [name] is a 86 y.o. male with PMH significant for right knee acute meniscal tears, osteoarthritis, urticaria, IBS, mild sleep apnea not on CPAP, neuromuscular system disorder, spinal stenosis, GERD, chronic fatigue syndrome, alcohol abuse, thiamine deficiency who was recently hospitalized 10/23-10/29 for acute CVA after which she was in inpatient rehab for a week and was discharged to home.   11/16, patient presented to the ED with nausea, vomiting, diarrhea, fever and mild confusion.  In the ED, he had a fever of 103.9, heart rate elevated to 130s, blood pressure fluctuating, as low as 90s, O2 sat 88% on room air. Labs with WBC count 14.2, lactic acid 2, hemoglobin 14.9, platelet 200, BUN/creatinine 15/0.97, Rest virus panel negative for COVID, flu.  Strep pneumonia urinary antigen negative Urinalysis clear yellow urine 20 ketones, negative nitrate or leukocytes Blood culture and urine culture sent. CT chest, abdomen and pelvis were obtained.  It showed mild atelectasis/pneumonia in the lung bases.  Rectal fluid level correlating with diarrhea without bowel wall inflammation. Patient was started on IV fluid, broad-spectrum IV antibiotics.   Subjective: Patient was seen and examined this morning.  Pleasant elderly Caucasian male.  Propped up in bed.  Not in distress.  Significant other at bedside. Chart reviewed. Labs from this morning showed WC count down to 6.1, lactic acid normalized to 0.9, hemoglobin at 11.2  Assessment and plan: Severe sepsis  Community-acquired pneumonia  Presented with nausea, vomiting, diarrhea, confusion, fever of 100.9, WBC count of 14.2 and lactic acid elevated to 2 Imagings suggestive of bibasilar pneumonia Blood culture and urine culture sent. Started on broad-spectrum  antibiotics No recurrence of fever.   WBC count and lactic acid level normalized. Recent Labs  Lab 12/08/21 0438 12/08/21 0638 12/09/21 0457  WBC 14.2*  --  6.1  LATICACIDVEN 2.0* 0.9  --    Acute respiratory failure with hypoxia  Required up to 3 L oxygen by nasal cannula.  Wean down as tolerated.  Ambulate check for oxygen requirement  Acute metabolic encephalopathy Patient was confused initially probably due to sepsis.  Mental status improved today.  Diarrhea Liquid stool seen in rectum.  Continue to monitor for frequency and severity   History of CVA/HLD Continue aspirin and atorvastatin.   Thiamine deficiency Continue thiamine supplementation.   GERD (gastroesophageal reflux disease) Resume PPI.  Anxiety/depression Celexa 20 mg daily, Xanax 0.5 mg at bedtime as needed  Constipation Dulcolax as needed    Goals of care   Code Status: Full Code    Mobility: PT eval ordered.  Skin assessment:     Nutritional status:  Body mass index is 21.73 kg/m.  Nutrition Problem: Moderate Malnutrition Etiology: chronic illness (CVA) Signs/Symptoms: moderate fat depletion, moderate muscle depletion     Diet:  Diet Order             Diet Heart Room service appropriate? Yes; Fluid consistency: Thin  Diet effective now                   DVT prophylaxis:  SCDs Start: 12/08/21 9163   Antimicrobials: IV Rocephin, azithromycin Fluid: None Consultants: None Family Communication: Significant other at bedside  Status is: Inpatient  Continue in-hospital care because: Continues to require IV antibiotics, inpatient monitoring.  Pending PT eval Level of care: Progressive   Dispo:  The patient is from: Home              Anticipated d/c is to: Pending clinical course              Patient currently is not medically stable to d/c.   Difficult to place patient No     Infusions:   azithromycin 500 mg (12/09/21 1019)   cefTRIAXone (ROCEPHIN)  IV 2 g (12/09/21  1138)    Scheduled Meds:  aspirin EC  81 mg Oral Daily   atorvastatin  40 mg Oral Daily   citalopram  20 mg Oral Daily   guaiFENesin  600 mg Oral BID   lactose free nutrition  237 mL Oral TID WC   [START ON 12/10/2021] multivitamin with minerals  1 tablet Oral Daily   pantoprazole  40 mg Oral Daily   thiamine  100 mg Oral Daily    PRN meds: acetaminophen **OR** acetaminophen, bisacodyl, mouth rinse, oxyCODONE-acetaminophen   Antimicrobials: Anti-infectives (From admission, onward)    Start     Dose/Rate Route Frequency Ordered Stop   12/08/21 1200  cefTRIAXone (ROCEPHIN) 2 g in sodium chloride 0.9 % 100 mL IVPB        2 g 200 mL/hr over 30 Minutes Intravenous Every 24 hours 12/08/21 0928 12/13/21 1159   12/08/21 1000  azithromycin (ZITHROMAX) 500 mg in sodium chloride 0.9 % 250 mL IVPB        500 mg 250 mL/hr over 60 Minutes Intravenous Every 24 hours 12/08/21 0928 12/13/21 0959   12/08/21 0445  ceFEPIme (MAXIPIME) 2 g in sodium chloride 0.9 % 100 mL IVPB        2 g 200 mL/hr over 30 Minutes Intravenous  Once 12/08/21 0440 12/08/21 0635   12/08/21 0445  metroNIDAZOLE (FLAGYL) IVPB 500 mg        500 mg 100 mL/hr over 60 Minutes Intravenous  Once 12/08/21 0440 12/08/21 0715   12/08/21 0445  vancomycin (VANCOCIN) IVPB 1000 mg/200 mL premix        1,000 mg 200 mL/hr over 60 Minutes Intravenous  Once 12/08/21 0440 12/08/21 0715       Objective: Vitals:   12/09/21 0204 12/09/21 0612  BP: 117/69 117/63  Pulse: 97 90  Resp: 20 18  Temp: 99.1 F (37.3 C) 98.6 F (37 C)  SpO2: 96% 95%    Intake/Output Summary (Last 24 hours) at 12/09/2021 1400 Last data filed at 12/09/2021 0655 Gross per 24 hour  Intake 805 ml  Output 2050 ml  Net -1245 ml   Filed Weights   12/08/21 1644 12/09/21 0612  Weight: 70.2 kg 68.7 kg   Weight change:  Body mass index is 21.73 kg/m.   Physical Exam: General exam: Pleasant, elderly Caucasian male.  Not in distress Skin: No rashes,  lesions or ulcers. HEENT: Atraumatic, normocephalic, no obvious bleeding Lungs: Clear to auscultation bilaterally.  Coughs on deep breathing CVS: Regular rate and rhythm, no murmur GI/Abd soft, nontender, nondistended, bowels are present CNS: Alert, awake, oriented x3 Psychiatry: Mood appropriate Extremities: No pedal edema, no calf tenderness  Data Review: I have personally reviewed the laboratory data and studies available.  F/u labs ordered Unresulted Labs (From admission, onward)     Start     Ordered   12/08/21 0929  Expectorated Sputum Assessment w Gram Stain, Rflx to Resp Cult  Once,   R        12/08/21 0928   12/08/21 0441  SARS Coronavirus 2  by RT PCR (hospital order, performed in [facility] hospital lab) *cepheid single result test* Anterior Nasal Swab  (Tier 2 - Symptomatic/Asymptomatic)  Once,   URGENT       Question:  Patient immune status  Answer:  Normal   12/08/21 0440            Signed, Terrilee Croak, MD [facility] 12/09/2021

## 2021-12-09 NOTE — Progress Notes (Signed)
PHARMACY - PHYSICIAN COMMUNICATION CRITICAL VALUE ALERT - BLOOD CULTURE IDENTIFICATION (BCID)  Jose [name] is an 86 y.o. male who presented to [facility] on 12/08/2021 with a chief complaint of n/v/d.  He's currently on ceftriaxone and azithromycin for PNA.  One of four blood culture bottles collected on 12/08/24 has GPC in clusters (BCID = staph species)    Name of physician (or Provider) Contacted: Dr. Pietro Cassis  Current antibiotics: ceftriaxone and azithromycin  Changes to prescribed antibiotics recommended:  - staph species is likely a contaminant. Continue with azithromycin and ceftriaxone for now.   Results for orders placed or performed during the hospital encounter of 12/08/21  Blood Culture ID Panel (Reflexed) (Collected: 12/08/2021  4:53 AM)  Result Value Ref Range   Enterococcus faecalis NOT DETECTED NOT DETECTED   Enterococcus Faecium NOT DETECTED NOT DETECTED   Listeria monocytogenes NOT DETECTED NOT DETECTED   Staphylococcus species DETECTED (A) NOT DETECTED   Staphylococcus aureus (BCID) NOT DETECTED NOT DETECTED   Staphylococcus epidermidis NOT DETECTED NOT DETECTED   Staphylococcus lugdunensis NOT DETECTED NOT DETECTED   Streptococcus species NOT DETECTED NOT DETECTED   Streptococcus agalactiae NOT DETECTED NOT DETECTED   Streptococcus pneumoniae NOT DETECTED NOT DETECTED   Streptococcus pyogenes NOT DETECTED NOT DETECTED   A.calcoaceticus-baumannii NOT DETECTED NOT DETECTED   Bacteroides fragilis NOT DETECTED NOT DETECTED   Enterobacterales NOT DETECTED NOT DETECTED   Enterobacter cloacae complex NOT DETECTED NOT DETECTED   Escherichia coli NOT DETECTED NOT DETECTED   Klebsiella aerogenes NOT DETECTED NOT DETECTED   Klebsiella oxytoca NOT DETECTED NOT DETECTED   Klebsiella pneumoniae NOT DETECTED NOT DETECTED   Proteus species NOT DETECTED NOT DETECTED   Salmonella species NOT DETECTED NOT DETECTED   Serratia marcescens NOT DETECTED NOT DETECTED    Haemophilus influenzae NOT DETECTED NOT DETECTED   Neisseria meningitidis NOT DETECTED NOT DETECTED   Pseudomonas aeruginosa NOT DETECTED NOT DETECTED   Stenotrophomonas maltophilia NOT DETECTED NOT DETECTED   Candida albicans NOT DETECTED NOT DETECTED   Candida auris NOT DETECTED NOT DETECTED   Candida glabrata NOT DETECTED NOT DETECTED   Candida krusei NOT DETECTED NOT DETECTED   Candida parapsilosis NOT DETECTED NOT DETECTED   Candida tropicalis NOT DETECTED NOT DETECTED   Cryptococcus neoformans/gattii NOT DETECTED NOT DETECTED    Lynelle Doctor 12/09/2021  5:24 PM

## 2021-12-10 DIAGNOSIS — A419 Sepsis, unspecified organism: Secondary | ICD-10-CM | POA: Diagnosis not present

## 2021-12-10 DIAGNOSIS — R6520 Severe sepsis without septic shock: Secondary | ICD-10-CM | POA: Diagnosis not present

## 2021-12-10 LAB — CULTURE, BLOOD (ROUTINE X 2): Special Requests: ADEQUATE

## 2021-12-10 NOTE — Progress Notes (Signed)
PROGRESS NOTE  Jose [name]  DOB: [birth year]  PCP: Hoyt Koch, MD FGH:[id number]  DOA: 12/08/2021  LOS: 2 days  Hospital Day: 3  Brief narrative: Jose [name] is a 86 y.o. male with PMH significant for right knee acute meniscal tears, osteoarthritis, urticaria, IBS, mild sleep apnea not on CPAP, neuromuscular system disorder, spinal stenosis, GERD, chronic fatigue syndrome, alcohol abuse, thiamine deficiency who was recently hospitalized 10/23-10/29 for acute CVA after which she was in inpatient rehab for a week and was discharged to home.   11/16, patient presented to the ED with nausea, vomiting, diarrhea, fever and mild confusion.  In the ED, he had a fever of 103.9, heart rate elevated to 130s, blood pressure fluctuating, as low as 90s, O2 sat 88% on room air. Labs with WBC count 14.2, lactic acid 2, hemoglobin 14.9, platelet 200, BUN/creatinine 15/0.97, Rest virus panel negative for COVID, flu.  Strep pneumonia urinary antigen negative Urinalysis clear yellow urine 20 ketones, negative nitrate or leukocytes Blood culture and urine culture sent. CT chest, abdomen and pelvis were obtained.  It showed mild atelectasis/pneumonia in the lung bases.  Rectal fluid level correlating with diarrhea without bowel wall inflammation. Patient was started on IV fluid, broad-spectrum IV antibiotics.   Subjective: Patient was seen and examined this morning.   Lying on bed.  Not in distress.  Feels better.  On room air later this morning.  Seen by PT.  Inpatient rehab recommended.   Assessment and plan: Severe sepsis  Community-acquired pneumonia  Presented with nausea, vomiting, diarrhea, confusion, fever of 100.9, WBC count of 14.2 and lactic acid elevated to 2 Imagings suggestive of bibasilar pneumonia Blood culture and urine culture sent. Currently remains on IV Rocephin, azithromycin No recurrence of fever.   WBC count and lactic acid level normalized. Recent  Labs  Lab 12/08/21 0438 12/08/21 0638 12/09/21 0457  WBC 14.2*  --  6.1  LATICACIDVEN 2.0* 0.9  --    Acute respiratory failure with hypoxia  Required up to 3 L oxygen by nasal cannula.  Gradually wean down.  Currently on no oxygen at rest.  Check ambulatory oxygen requirement.  Acute metabolic encephalopathy Patient was confused initially probably due to sepsis.  Mental status improved today.  Diarrhea Improved   History of CVA/HLD Continue aspirin and atorvastatin.   Thiamine deficiency Continue thiamine supplementation.   GERD (gastroesophageal reflux disease) Resume PPI.  Anxiety/depression Celexa 20 mg daily, Xanax 0.5 mg at bedtime as needed  Constipation Dulcolax as needed  Impaired mobility Fall Compression fracture of the lumbar vertebrae PT eval obtained.  CIR recommended. Continue pain management   Goals of care   Code Status: Full Code    Mobility: PT eval obtained.  Skin assessment:     Nutritional status:  Body mass index is 21.73 kg/m.  Nutrition Problem: Moderate Malnutrition Etiology: chronic illness (CVA) Signs/Symptoms: moderate fat depletion, moderate muscle depletion     Diet:  Diet Order             Diet Heart Room service appropriate? Yes; Fluid consistency: Thin  Diet effective now                   DVT prophylaxis:  SCDs Start: 12/08/21 6789   Antimicrobials: IV Rocephin, azithromycin Fluid: None Consultants: None Family Communication: Significant other at bedside  Status is: Inpatient  Continue in-hospital care because: Pending CIR Level of care: Progressive   Dispo: The patient is from: Home  Anticipated d/c is to: Pending CIR              Patient currently is medically stable to d/c.   Difficult to place patient No     Infusions:   azithromycin 500 mg (12/10/21 0959)   cefTRIAXone (ROCEPHIN)  IV 2 g (12/10/21 1200)    Scheduled Meds:  aspirin EC  81 mg Oral Daily   atorvastatin   40 mg Oral Daily   citalopram  20 mg Oral Daily   guaiFENesin  600 mg Oral BID   lactose free nutrition  237 mL Oral TID WC   multivitamin with minerals  1 tablet Oral Daily   pantoprazole  40 mg Oral Daily   thiamine  100 mg Oral Daily    PRN meds: acetaminophen **OR** acetaminophen, bisacodyl, mouth rinse, oxyCODONE-acetaminophen   Antimicrobials: Anti-infectives (From admission, onward)    Start     Dose/Rate Route Frequency Ordered Stop   12/08/21 1200  cefTRIAXone (ROCEPHIN) 2 g in sodium chloride 0.9 % 100 mL IVPB        2 g 200 mL/hr over 30 Minutes Intravenous Every 24 hours 12/08/21 0928 12/13/21 1159   12/08/21 1000  azithromycin (ZITHROMAX) 500 mg in sodium chloride 0.9 % 250 mL IVPB        500 mg 250 mL/hr over 60 Minutes Intravenous Every 24 hours 12/08/21 0928 12/13/21 0959   12/08/21 0445  ceFEPIme (MAXIPIME) 2 g in sodium chloride 0.9 % 100 mL IVPB        2 g 200 mL/hr over 30 Minutes Intravenous  Once 12/08/21 0440 12/08/21 0635   12/08/21 0445  metroNIDAZOLE (FLAGYL) IVPB 500 mg        500 mg 100 mL/hr over 60 Minutes Intravenous  Once 12/08/21 0440 12/08/21 0715   12/08/21 0445  vancomycin (VANCOCIN) IVPB 1000 mg/200 mL premix        1,000 mg 200 mL/hr over 60 Minutes Intravenous  Once 12/08/21 0440 12/08/21 0715       Objective: Vitals:   12/10/21 1136 12/10/21 1216  BP:  125/65  Pulse:  92  Resp:  (!) 21  Temp:  98 F (36.7 C)  SpO2: 93% 96%    Intake/Output Summary (Last 24 hours) at 12/10/2021 1442 Last data filed at 12/10/2021 1314 Gross per 24 hour  Intake 720 ml  Output 2575 ml  Net -1855 ml   Filed Weights   12/08/21 1644 12/09/21 0612  Weight: 70.2 kg 68.7 kg   Weight change:  Body mass index is 21.73 kg/m.   Physical Exam: General exam: Pleasant, elderly Caucasian male.  Not in distress Skin: No rashes, lesions or ulcers. HEENT: Atraumatic, normocephalic, no obvious bleeding Lungs: Clear to auscultation bilaterally.  Coughs  on deep breathing CVS: Regular rate and rhythm, no murmur GI/Abd soft, nontender, nondistended, bowels are present CNS: Alert, awake, oriented x3 Psychiatry: Mood appropriate Extremities: No pedal edema, no calf tenderness  Data Review: I have personally reviewed the laboratory data and studies available.  F/u labs ordered Unresulted Labs (From admission, onward)     Start     Ordered   12/08/21 0929  Expectorated Sputum Assessment w Gram Stain, Rflx to Resp Cult  Once,   R        12/08/21 0928   12/08/21 0441  SARS Coronavirus 2 by RT PCR (hospital order, performed in [facility] hospital lab) *cepheid single result test* Anterior Nasal Swab  (Tier 2 - Symptomatic/Asymptomatic)  Once,  URGENT       Question:  Patient immune status  Answer:  Normal   12/08/21 0440            Signed, Terrilee Croak, MD [facility] 12/10/2021

## 2021-12-10 NOTE — Evaluation (Signed)
Physical Therapy Evaluation Patient Details Name: Jose [name] MRN: [id number] DOB: 1935-07-26 Today's Date: 12/10/2021  History of Present Illness  86 y.o. male with PMH significant for right knee acute meniscal tears, osteoarthritis, urticaria, IBS, mild sleep apnea not on CPAP, neuromuscular system disorder, spinal stenosis, GERD, chronic fatigue syndrome, alcohol abuse, thiamine deficiency who was recently hospitalized 10/23-10/29 for EtOH withdrawal and acute CVA after which she was in inpatient rehab for a week and was discharged to home 11/28/21, 12/02/21 outpatient MD visit for fall, L3 compression fx per imaging, TLSO ordered. patient presented to the ED 12/08/21 with nausea, vomiting, diarrhea, fever and mild confusion. Dx of PNA.  Clinical Impression  Pt admitted with above diagnosis. Pt reports low back pain due to recent L3 compression fracture, his significant other reports they just recently received a TLSO brace for his back and that he's done very little walking since the compression fracture. Pt ambulated 70' with RW, SaO2 93% on room air, ongoing verbal/manual cues required for safe positioning in RW. Pt is somewhat impulsive. [name] reports pt was refusing to bathe & brush teeth at home, and that he'd stopped walking upon DC home from inpt rehab. She feels he's, "in a funk". She isn't able to provide 24/7 assistance and would like to see if he can return to inpatient rehab, and if not, then ST-SNF. 24/7 assistance recommended due to pt's impulsivity and decreased safety awareness.  Pt currently with functional limitations due to the deficits listed below (see PT Problem List). Pt will benefit from skilled PT to increase their independence and safety with mobility to allow discharge to the venue listed below.          Recommendations for follow up therapy are one component of a multi-disciplinary discharge planning process, led by the attending physician.  Recommendations may be  updated based on patient status, additional functional criteria and insurance authorization.  Follow Up Recommendations Acute inpatient rehab (3hours/day) Can patient physically be transported by private vehicle: Yes    Assistance Recommended at Discharge Frequent or constant Supervision/Assistance  Patient can return home with the following  A little help with walking and/or transfers;A little help with bathing/dressing/bathroom;Assistance with cooking/housework;Assist for transportation;Help with stairs or ramp for entrance;Direct supervision/assist for financial management;Direct supervision/assist for medications management    Equipment Recommendations None recommended by PT  Recommendations for Other Services  Rehab consult    Functional Status Assessment Patient has had a recent decline in their functional status and demonstrates the ability to make significant improvements in function in a reasonable and predictable amount of time.     Precautions / Restrictions Precautions Precautions: Fall;Back Precaution Comments: fell ~12/02/21 (presented to outpt MD 12/02/21 with back pain, L3 compression fx per imaging); instructed pt in log roll Required Braces or Orthoses: Spinal Brace Spinal Brace: Thoracolumbosacral orthotic (TLSO ordered by outpt MD, brace is at home) Restrictions Weight Bearing Restrictions: No      Mobility  Bed Mobility Overal bed mobility: Needs Assistance Bed Mobility: Rolling, Sidelying to Sit Rolling: Supervision Sidelying to sit: Supervision       General bed mobility comments: verbal cues for log roll technique, pt c/o back pain    Transfers Overall transfer level: Needs assistance Equipment used: Rolling walker (2 wheels) Transfers: Sit to/from Stand Sit to Stand: Min assist, From elevated surface           General transfer comment: min A to power up, VCs hand placement    Ambulation/Gait Ambulation/Gait assistance:  Min guard Gait  Distance (Feet): 60 Feet Assistive device: Rolling walker (2 wheels) Gait Pattern/deviations: Step-through pattern, Trunk flexed, Decreased stride length Gait velocity: decreased     General Gait Details: ongoing VCs for proximity to RW, distance limited by back pain, SaO2 93% on room air walking  Stairs            Wheelchair Mobility    Modified Rankin (Stroke Patients Only)       Balance   Sitting-balance support: Feet supported, No upper extremity supported Sitting balance-Leahy Scale: Good     Standing balance support: During functional activity, Reliant on assistive device for balance, Bilateral upper extremity supported Standing balance-Leahy Scale: Poor                               Pertinent Vitals/Pain Pain Assessment Faces Pain Scale: Hurts even more Pain Location: low back Pain Descriptors / Indicators: Grimacing, Guarding, Moaning Pain Intervention(s): Limited activity within patient's tolerance, Monitored during session, Patient requesting pain meds-RN notified, Repositioned    Home Living Family/patient expects to be discharged to:: Private residence Living Arrangements: Alone Available Help at Discharge: Friend(s);Available PRN/intermittently Type of Home: House Home Access: Level entry   Entrance Stairs-Number of Steps: 1 (threshold) Alternate Level Stairs-Number of Steps: 12 steps to access basement Home Layout: Two level;Able to live on main level with bedroom/bathroom Home Equipment: Adaptive equipment;Rolling Walker (2 wheels);Cane - single point Additional Comments: Prior to CVA, pt was active, worked out at gym 5x/week; Since DC home from [employer] has been mostly in bed, refusing to bathe/brush teeth. Significant other is not able to provide 24/7 assist.    Prior Function               Mobility Comments: after DC from CIR 11/28/21 was using a RW, no AD prior to CVA in October ADLs Comments: friend reports he refused to  bathe/brush teeth after DC home from [facility] 11/28/21     Hand Dominance   Dominant Hand: Right    Extremity/Trunk Assessment   Upper Extremity Assessment Upper Extremity Assessment: Defer to OT evaluation    Lower Extremity Assessment Lower Extremity Assessment: Overall WFL for tasks assessed RLE Sensation: WNL LLE Sensation: WNL    Cervical / Trunk Assessment Cervical / Trunk Assessment: Kyphotic  Communication   Communication: No difficulties  Cognition Arousal/Alertness: Awake/alert Behavior During Therapy: Impulsive Overall Cognitive Status: Impaired/Different from baseline Area of Impairment: Following commands, Safety/judgement, Memory, Awareness                     Memory: Decreased short-term memory Following Commands: Follows one step commands with increased time Safety/Judgement: Decreased awareness of safety     General Comments: pt not aware he was lying in bed saturated with urine, requires ongoing verbal cues for safe use of RW when ambulating, significant other stated pt stopped bathing at home upon DC from inpatient rehab 11/28/21        General Comments      Exercises     Assessment/Plan    PT Assessment Patient needs continued PT services  PT Problem List Decreased activity tolerance;Decreased balance;Decreased knowledge of use of DME;Pain;Decreased mobility       PT Treatment Interventions DME instruction;Gait training;Functional mobility training;Therapeutic activities;Therapeutic exercise;Balance training;Patient/family education    PT Goals (Current goals can be found in the Care Plan section)  Acute Rehab PT Goals Patient Stated Goal: regain independence  PT Goal Formulation: With patient/family Time For Goal Achievement: 12/24/21 Potential to Achieve Goals: Good    Frequency Min 3X/week     Co-evaluation               AM-PAC PT "6 Clicks" Mobility  Outcome Measure Help needed turning from your back to your side while  in a flat bed without using bedrails?: A Little Help needed moving from lying on your back to sitting on the side of a flat bed without using bedrails?: A Little Help needed moving to and from a bed to a chair (including a wheelchair)?: A Little Help needed standing up from a chair using your arms (e.g., wheelchair or bedside chair)?: A Little Help needed to walk in hospital room?: A Little Help needed climbing 3-5 steps with a railing? : A Lot 6 Click Score: 17    End of Session Equipment Utilized During Treatment: Gait belt Activity Tolerance: Patient limited by pain Patient left: in chair;with chair alarm set;with call bell/phone within reach;with family/visitor present Nurse Communication: Mobility status;Patient requests pain meds PT Visit Diagnosis: Unsteadiness on feet (R26.81);Difficulty in walking, not elsewhere classified (R26.2);Other abnormalities of gait and mobility (R26.89)    Time: 6387-5643 PT Time Calculation (min) (ACUTE ONLY): 32 min   Charges:   PT Evaluation $PT Eval Moderate Complexity: 1 Mod PT Treatments $Gait Training: 8-22 mins        Blondell Reveal Kistler PT 12/10/2021  Acute Rehabilitation Services  Office [phone]

## 2021-12-10 NOTE — TOC Progression Note (Signed)
Transition of Care [facility]) - Progression Note    Patient Details  Name: Jose [name] MRN: [id number] Date of Birth: [birth year]  Transition of Care [facility] CM/SW Contact  Henrietta Dine, RN Phone Number: 12/10/2021, 12:50 PM  Clinical Narrative:    PT eval recc acute IP rehab; TOC will con't to follow.        Expected Discharge Plan and Services                                                 Social Determinants of Health (SDOH) Interventions    Readmission Risk Interventions     No data to display

## 2021-12-11 DIAGNOSIS — R6520 Severe sepsis without septic shock: Secondary | ICD-10-CM | POA: Diagnosis not present

## 2021-12-11 DIAGNOSIS — A419 Sepsis, unspecified organism: Secondary | ICD-10-CM | POA: Diagnosis not present

## 2021-12-11 NOTE — Progress Notes (Signed)
PROGRESS NOTE  Jose [name]  DOB: [birth year]  PCP: Hoyt Koch, MD SAY:[id number]  DOA: 12/08/2021  LOS: 3 days  Hospital Day: 4  Brief narrative: Jose [name] is a 86 y.o. male with PMH significant for right knee acute meniscal tears, osteoarthritis, urticaria, IBS, mild sleep apnea not on CPAP, neuromuscular system disorder, spinal stenosis, GERD, chronic fatigue syndrome, alcohol abuse, thiamine deficiency who was recently hospitalized 10/23-10/29 for acute CVA after which she was in inpatient rehab for a week and was discharged to home.   11/16, patient presented to the ED with nausea, vomiting, diarrhea, fever and mild confusion.  In the ED, he had a fever of 103.9, heart rate elevated to 130s, blood pressure fluctuating, as low as 90s, O2 sat 88% on room air. Labs with WBC count 14.2, lactic acid 2, hemoglobin 14.9, platelet 200, BUN/creatinine 15/0.97, Rest virus panel negative for COVID, flu.  Strep pneumonia urinary antigen negative Urinalysis clear yellow urine 20 ketones, negative nitrate or leukocytes Blood culture and urine culture sent. CT chest, abdomen and pelvis were obtained.  It showed mild atelectasis/pneumonia in the lung bases.  Rectal fluid level correlating with diarrhea without bowel wall inflammation. Patient was started on IV fluid, broad-spectrum IV antibiotics.   Subjective: Patient was seen and examined this morning.   Not on supplemental oxygen.  Feels good.  He is pending CIR placement but he wants to go home.  His significant other is not at bedside  Assessment and plan: Severe sepsis  Community-acquired pneumonia  Presented with nausea, vomiting, diarrhea, confusion, fever of 100.9, WBC count of 14.2 and lactic acid elevated to 2 Imagings suggestive of bibasilar pneumonia Blood culture and urine culture sent. We will complete 5-day course of IV Rocephin, azithromycin No recurrence of fever.   WBC count and lactic acid  level normalized. Recent Labs  Lab 12/08/21 0438 12/08/21 0638 12/09/21 0457  WBC 14.2*  --  6.1  LATICACIDVEN 2.0* 0.9  --    Acute respiratory failure with hypoxia  Initially required up to 3 L oxygen by nasal cannula.  Gradually wean down.  Currently on supplemental oxygen.  Acute metabolic encephalopathy Patient was confused initially probably due to sepsis.  Mental status improved  Diarrhea Likely because of viral syndrome.  Improved   History of CVA/HLD Continue aspirin and atorvastatin.   Thiamine deficiency Continue thiamine supplementation.   GERD (gastroesophageal reflux disease) Continue PPI.  Anxiety/depression Celexa 20 mg daily, Xanax 0.5 mg at bedtime as needed  Constipation Dulcolax as needed  Impaired mobility Fall Compression fracture of the lumbar vertebrae PT eval obtained.  CIR recommended. Continue pain management   Goals of care   Code Status: Full Code    Mobility: PT eval obtained.  Skin assessment:     Nutritional status:  Body mass index is 21.04 kg/m.  Nutrition Problem: Moderate Malnutrition Etiology: chronic illness (CVA) Signs/Symptoms: moderate fat depletion, moderate muscle depletion     Diet:  Diet Order             Diet Heart Room service appropriate? Yes; Fluid consistency: Thin  Diet effective now                   DVT prophylaxis:  SCDs Start: 12/08/21 2355   Antimicrobials: IV Rocephin, azithromycin Fluid: None Consultants: None Family Communication: Significant other at bedside  Status is: Inpatient  Continue in-hospital care because: Pending CIR Level of care: Progressive   Dispo: The patient is from:  Home              Anticipated d/c is to: Pending CIR              Patient currently is medically stable to d/c.   Difficult to place patient No     Infusions:   azithromycin 500 mg (12/11/21 0954)   cefTRIAXone (ROCEPHIN)  IV 2 g (12/11/21 1334)    Scheduled Meds:  aspirin EC  81  mg Oral Daily   atorvastatin  40 mg Oral Daily   citalopram  20 mg Oral Daily   guaiFENesin  600 mg Oral BID   lactose free nutrition  237 mL Oral TID WC   multivitamin with minerals  1 tablet Oral Daily   pantoprazole  40 mg Oral Daily   thiamine  100 mg Oral Daily    PRN meds: acetaminophen **OR** acetaminophen, bisacodyl, mouth rinse, oxyCODONE-acetaminophen   Antimicrobials: Anti-infectives (From admission, onward)    Start     Dose/Rate Route Frequency Ordered Stop   12/08/21 1200  cefTRIAXone (ROCEPHIN) 2 g in sodium chloride 0.9 % 100 mL IVPB        2 g 200 mL/hr over 30 Minutes Intravenous Every 24 hours 12/08/21 0928 12/13/21 1159   12/08/21 1000  azithromycin (ZITHROMAX) 500 mg in sodium chloride 0.9 % 250 mL IVPB        500 mg 250 mL/hr over 60 Minutes Intravenous Every 24 hours 12/08/21 0928 12/13/21 0959   12/08/21 0445  ceFEPIme (MAXIPIME) 2 g in sodium chloride 0.9 % 100 mL IVPB        2 g 200 mL/hr over 30 Minutes Intravenous  Once 12/08/21 0440 12/08/21 0635   12/08/21 0445  metroNIDAZOLE (FLAGYL) IVPB 500 mg        500 mg 100 mL/hr over 60 Minutes Intravenous  Once 12/08/21 0440 12/08/21 0715   12/08/21 0445  vancomycin (VANCOCIN) IVPB 1000 mg/200 mL premix        1,000 mg 200 mL/hr over 60 Minutes Intravenous  Once 12/08/21 0440 12/08/21 0715       Objective: Vitals:   12/11/21 0531 12/11/21 1316  BP: 112/70 124/78  Pulse: 97 87  Resp: 18 18  Temp: 98.5 F (36.9 C) 98.4 F (36.9 C)  SpO2: 95% 96%    Intake/Output Summary (Last 24 hours) at 12/11/2021 1525 Last data filed at 12/11/2021 0500 Gross per 24 hour  Intake 100 ml  Output 1650 ml  Net -1550 ml   Filed Weights   12/08/21 1644 12/09/21 0612 12/11/21 0500  Weight: 70.2 kg 68.7 kg 66.5 kg   Weight change:  Body mass index is 21.04 kg/m.   Physical Exam: General exam: Pleasant, elderly Caucasian male.  Not in physical distress.  Not in pain at rest. Skin: No rashes, lesions or  ulcers. HEENT: Atraumatic, normocephalic, no obvious bleeding Lungs: Clear to auscultation bilaterally.  Coughs on deep breathing CVS: Regular rate and rhythm, no murmur GI/Abd soft, nontender, nondistended, bowels are present CNS: Alert, awake, oriented x3 Psychiatry: Mood appropriate Extremities: No pedal edema, no calf tenderness  Data Review: I have personally reviewed the laboratory data and studies available.  F/u labs ordered Unresulted Labs (From admission, onward)     Start     Ordered   12/08/21 0929  Expectorated Sputum Assessment w Gram Stain, Rflx to Resp Cult  Once,   R        12/08/21 0928   12/08/21 0441  SARS  Coronavirus 2 by RT PCR (hospital order, performed in [facility] hospital lab) *cepheid single result test* Anterior Nasal Swab  (Tier 2 - Symptomatic/Asymptomatic)  Once,   URGENT       Question:  Patient immune status  Answer:  Normal   12/08/21 0440            Signed, Terrilee Croak, MD [facility] 12/11/2021

## 2021-12-11 NOTE — Progress Notes (Signed)
Inpatient Rehab Admissions Coordinator:  Consult received. Would appreciate an OT evaluation to help determine if pt is an appropriate candidate for possible CIR admission. Will continue to follow.   Gayland Curry, [place], [place] Admissions Coordinator [phone]

## 2021-12-11 NOTE — Plan of Care (Signed)
  Problem: Education: Goal: Knowledge of disease or condition will improve Outcome: Progressing   Problem: Coping: Goal: Will verbalize positive feelings about self Outcome: Progressing Goal: Will identify appropriate support needs Outcome: Progressing   Problem: Safety: Goal: Ability to remain free from injury will improve Outcome: Progressing

## 2021-12-12 ENCOUNTER — Inpatient Hospital Stay: Payer: Medicare Other | Admitting: Internal Medicine

## 2021-12-12 DIAGNOSIS — A419 Sepsis, unspecified organism: Secondary | ICD-10-CM | POA: Diagnosis not present

## 2021-12-12 DIAGNOSIS — R6520 Severe sepsis without septic shock: Secondary | ICD-10-CM | POA: Diagnosis not present

## 2021-12-12 NOTE — Progress Notes (Signed)
Inpatient Rehab Admissions:  Inpatient Rehab Consult received.  I spoke with patient on the telephone for rehabilitation assessment and to discuss goals and expectations of an inpatient rehab admission.  Pt is unsure about pursuing CIR. Pt would like to see how well he does with PT today prior to making a decision. Will continue to follow.  Signed: Gayland Curry, [place], [place] Admissions Coordinator [phone]

## 2021-12-12 NOTE — Progress Notes (Signed)
PROGRESS NOTE  CORAL SOLER  DOB: [birth year]  PCP: Hoyt Koch, MD XBJ:[id number]  DOA: 12/08/2021  LOS: 4 days  Hospital Day: 5  Brief narrative: SEITH AIKEY is a 86 y.o. male with PMH significant for right knee acute meniscal tears, osteoarthritis, urticaria, IBS, mild sleep apnea not on CPAP, neuromuscular system disorder, spinal stenosis, GERD, chronic fatigue syndrome, alcohol abuse, thiamine deficiency who was recently hospitalized 10/23-10/29 for acute CVA after which she was in inpatient rehab for a week and was discharged to home.   11/16, patient presented to the ED with nausea, vomiting, diarrhea, fever and mild confusion.  In the ED, he had a fever of 103.9, heart rate elevated to 130s, blood pressure fluctuating, as low as 90s, O2 sat 88% on room air. Labs with WBC count 14.2, lactic acid 2, hemoglobin 14.9, platelet 200, BUN/creatinine 15/0.97, Rest virus panel negative for COVID, flu.  Strep pneumonia urinary antigen negative Urinalysis clear yellow urine 20 ketones, negative nitrate or leukocytes Blood culture and urine culture sent. CT chest, abdomen and pelvis were obtained.  It showed mild atelectasis/pneumonia in the lung bases.  Rectal fluid level correlating with diarrhea without bowel wall inflammation. Patient was started on IV fluid, broad-spectrum IV antibiotics.   Subjective: Patient was seen and examined this morning.   Not on supplemental oxygen.  Not in distress. I spoke with patient's significant other Nelda Bucks this morning  Assessment and plan: Severe sepsis  Community-acquired pneumonia  Presented with nausea, vomiting, diarrhea, confusion, fever of 100.9, WBC count of 14.2 and lactic acid elevated to 2 Imagings suggestive of bibasilar pneumonia Blood culture and urine culture did not show any growth Continue 5-day course of IV Rocephin, azithromycin No recurrence of fever.   WBC count and lactic acid level  normalized. Recent Labs  Lab 12/08/21 0438 12/08/21 0638 12/09/21 0457  WBC 14.2*  --  6.1  LATICACIDVEN 2.0* 0.9  --     Acute respiratory failure with hypoxia  Initially required up to 3 L oxygen by nasal cannula.  Gradually wean down.  Currently on supplemental oxygen.  Acute metabolic encephalopathy Patient was confused initially probably due to sepsis.  Mental status improved  Diarrhea Likely because of viral syndrome.  Improved   History of CVA/HLD Continue aspirin and atorvastatin.   Thiamine deficiency Continue thiamine supplementation.   GERD (gastroesophageal reflux disease) Continue PPI.  Anxiety/depression Celexa 20 mg daily, Xanax 0.5 mg at bedtime as needed  Constipation Dulcolax as needed  Impaired mobility Fall Compression fracture of the lumbar vertebrae PT eval obtained.  CIR recommended. Continue pain management   Goals of care   Code Status: Full Code    Mobility: PT eval obtained.  Skin assessment:     Nutritional status:  Body mass index is 21.04 kg/m.  Nutrition Problem: Moderate Malnutrition Etiology: chronic illness (CVA) Signs/Symptoms: moderate fat depletion, moderate muscle depletion     Diet:  Diet Order             Diet Heart Room service appropriate? Yes; Fluid consistency: Thin  Diet effective now                   DVT prophylaxis:  SCDs Start: 12/08/21 3086   Antimicrobials: IV Rocephin, azithromycin Fluid: None Consultants: None Family Communication: Significant other at bedside  Status is: Inpatient  Continue in-hospital care because: Pending CIR Level of care: Progressive   Dispo: The patient is from: Home  Anticipated d/c is to: Pending CIR              Patient currently is medically stable to d/c.   Difficult to place patient No     Infusions:     Scheduled Meds:  aspirin EC  81 mg Oral Daily   atorvastatin  40 mg Oral Daily   citalopram  20 mg Oral Daily    guaiFENesin  600 mg Oral BID   lactose free nutrition  237 mL Oral TID WC   multivitamin with minerals  1 tablet Oral Daily   pantoprazole  40 mg Oral Daily   thiamine  100 mg Oral Daily    PRN meds: acetaminophen **OR** acetaminophen, bisacodyl, mouth rinse, oxyCODONE-acetaminophen   Antimicrobials: Anti-infectives (From admission, onward)    Start     Dose/Rate Route Frequency Ordered Stop   12/08/21 1200  cefTRIAXone (ROCEPHIN) 2 g in sodium chloride 0.9 % 100 mL IVPB        2 g 200 mL/hr over 30 Minutes Intravenous Every 24 hours 12/08/21 0928 12/12/21 1300   12/08/21 1000  azithromycin (ZITHROMAX) 500 mg in sodium chloride 0.9 % 250 mL IVPB        500 mg 250 mL/hr over 60 Minutes Intravenous Every 24 hours 12/08/21 0928 12/12/21 1010   12/08/21 0445  ceFEPIme (MAXIPIME) 2 g in sodium chloride 0.9 % 100 mL IVPB        2 g 200 mL/hr over 30 Minutes Intravenous  Once 12/08/21 0440 12/08/21 0635   12/08/21 0445  metroNIDAZOLE (FLAGYL) IVPB 500 mg        500 mg 100 mL/hr over 60 Minutes Intravenous  Once 12/08/21 0440 12/08/21 0715   12/08/21 0445  vancomycin (VANCOCIN) IVPB 1000 mg/200 mL premix        1,000 mg 200 mL/hr over 60 Minutes Intravenous  Once 12/08/21 0440 12/08/21 0715       Objective: Vitals:   12/12/21 0654 12/12/21 1326  BP: 128/85 117/69  Pulse: 88 82  Resp: 20 18  Temp: 98.5 F (36.9 C) 98.3 F (36.8 C)  SpO2: 94% 98%    Intake/Output Summary (Last 24 hours) at 12/12/2021 1456 Last data filed at 12/12/2021 4315 Gross per 24 hour  Intake --  Output 1350 ml  Net -1350 ml    Filed Weights   12/08/21 1644 12/09/21 0612 12/11/21 0500  Weight: 70.2 kg 68.7 kg 66.5 kg   Weight change:  Body mass index is 21.04 kg/m.   Physical Exam: General exam: Pleasant, elderly Caucasian male.  Not in physical distress skin: No rashes, lesions or ulcers. HEENT: Atraumatic, normocephalic, no obvious bleeding Lungs: Clear to auscultation bilaterally.    CVS: Regular rate and rhythm, no murmur GI/Abd soft, nontender, nondistended, bowels are present CNS: Alert, awake, oriented x3 Psychiatry: Mood appropriate Extremities: No pedal edema, no calf tenderness  Data Review: I have personally reviewed the laboratory data and studies available.  F/u labs ordered Unresulted Labs (From admission, onward)     Start     Ordered   12/08/21 0929  Expectorated Sputum Assessment w Gram Stain, Rflx to Resp Cult  Once,   R        12/08/21 0928   12/08/21 0441  SARS Coronavirus 2 by RT PCR (hospital order, performed in [facility] hospital lab) *cepheid single result test* Anterior Nasal Swab  (Tier 2 - Symptomatic/Asymptomatic)  Once,   URGENT       Question:  Patient  immune status  Answer:  Normal   12/08/21 0440            Signed, Terrilee Croak, MD [facility] 12/12/2021

## 2021-12-12 NOTE — Evaluation (Signed)
Occupational Therapy Evaluation Patient Details Name: Jose [name] MRN: [id number] DOB: [birth year] Today's Date: 12/12/2021   History of Present Illness Jose [name] is a 86 y.o. male admitted 11/16 with nausea, vomiting, diarrhea, fever and mild confusion. PMH significant for recent L3 compression fx requiring TLSO, right knee acute meniscal tears, osteoarthritis, urticaria, IBS, mild sleep apnea not on CPAP, neuromuscular system disorder, spinal stenosis, GERD, chronic fatigue syndrome, alcohol abuse, thiamine deficiency who was recently hospitalized 10/23-10/29 for acute CVA after which he was in inpatient rehab for a week and was discharged to home.   Clinical Impression   Pt prior to CVA in late Oct was independent in ADL and mobility, going to gym 5x a week etc. When he got home, he reportedly was not bathing and participating in person hygiene enough. He has been having limited ambulation with RW with 2 fall resulting in L3 fx (need to confirm this - no brace present). Today he present with deficits in safety awareness, balance, activity tolerance, cognition, and ability to perform ADL. He is min to mod A for mobility with RW with 2 LOB that required OT intervention with gait belt to prevent fall. He is max A for LB dressing - but also proceeded to pull knees up to chest while supine to "stretch his back" Able to perform toilet transfer, required max A in standing for rear peri care, and min guard for standing grooming at sink utilizing the sink for balance. OT will continue to follow acutely.      Recommendations for follow up therapy are one component of a multi-disciplinary discharge planning process, led by the attending physician.  Recommendations may be updated based on patient status, additional functional criteria and insurance authorization.   Follow Up Recommendations  Acute inpatient rehab (3hours/day)     Assistance Recommended at Discharge Frequent or constant  Supervision/Assistance  Patient can return home with the following A lot of help with walking and/or transfers;A lot of help with bathing/dressing/bathroom;Assistance with cooking/housework;Direct supervision/assist for medications management;Direct supervision/assist for financial management;Assist for transportation;Help with stairs or ramp for entrance    Functional Status Assessment  Patient has had a recent decline in their functional status and demonstrates the ability to make significant improvements in function in a reasonable and predictable amount of time.  Equipment Recommendations  Other (comment) (defer to next venue)    Recommendations for Other Services Rehab consult     Precautions / Restrictions Precautions Precautions: Fall;Back Precaution Comments: fell ~12/02/21 (presented to outpt MD 12/02/21 with back pain, L3 compression fx per imaging); instructed pt in log roll Required Braces or Orthoses: Spinal Brace Spinal Brace: Thoracolumbosacral orthotic (brace is at home, not hospital) Restrictions Weight Bearing Restrictions: No      Mobility Bed Mobility Overal bed mobility: Needs Assistance Bed Mobility: Rolling, Sidelying to Sit, Sit to Sidelying Rolling: Min assist Sidelying to sit: Mod assist     Sit to sidelying: Min guard General bed mobility comments: re-educated for log roll technique,  assist for roll and trunk elevation from side pt c/o back pain    Transfers Overall transfer level: Needs assistance Equipment used: Rolling walker (2 wheels) Transfers: Sit to/from Stand Sit to Stand: Min assist           General transfer comment: min A to power up, VCs hand placement      Balance Overall balance assessment: Needs assistance Sitting-balance support: Feet supported, No upper extremity supported Sitting balance-Leahy Scale: Fair Sitting balance - Comments:  posterior loss of balance with dynamic LE activities   Standing balance support:  During functional activity, Reliant on assistive device for balance, Bilateral upper extremity supported, No upper extremity supported Standing balance-Leahy Scale: Poor Standing balance comment: pt leans on sink during grooming, otherwise dependent on RW for balance                           ADL either performed or assessed with clinical judgement   ADL Overall ADL's : Needs assistance/impaired Eating/Feeding: Bed level;Set up   Grooming: Minimal assistance;Wash/dry hands;Standing Grooming Details (indicate cue type and reason): leans against sink for balance Upper Body Bathing: Moderate assistance Upper Body Bathing Details (indicate cue type and reason): for back Lower Body Bathing: Minimal assistance;Sitting/lateral leans Lower Body Bathing Details (indicate cue type and reason): from sitting can bend over and touch his toes. Questioned back precautions and comfort. reports decreased pain in full bend "because I don't have any padding in my back bones anymore" Upper Body Dressing : Minimal assistance;Sitting Upper Body Dressing Details (indicate cue type and reason): don extra gown Lower Body Dressing: Maximal assistance;Bed level Lower Body Dressing Details (indicate cue type and reason): donning socks Toilet Transfer: Moderate assistance;Ambulation;Rolling walker (2 wheels) Toilet Transfer Details (indicate cue type and reason): LOB x2 requiring therapist assist to correct Toileting- Clothing Manipulation and Hygiene: Maximal assistance;Sit to/from stand Toileting - Clothing Manipulation Details (indicate cue type and reason): attempted from seated position, unable to reach     Functional mobility during ADLs: Moderate assistance;Rolling walker (2 wheels) General ADL Comments: no safety awareness, no regard for back precautions     Vision Baseline Vision/History: 1 Wears glasses Ability to See in Adequate Light: 0 Adequate Patient Visual Report: No change from  baseline Vision Assessment?: No apparent visual deficits     Perception     Praxis      Pertinent Vitals/Pain Pain Assessment Pain Assessment: Faces Faces Pain Scale: Hurts even more Pain Location: low back Pain Descriptors / Indicators: Grimacing, Guarding, Moaning Pain Intervention(s): Monitored during session, Repositioned     Hand Dominance Right   Extremity/Trunk Assessment Upper Extremity Assessment Upper Extremity Assessment: Generalized weakness (reports hx of R shoulder pain/deficits)   Lower Extremity Assessment Lower Extremity Assessment: Generalized weakness   Cervical / Trunk Assessment Cervical / Trunk Assessment: Other exceptions;Kyphotic Cervical / Trunk Exceptions: L3 fx from fall?   Communication Communication Communication: No difficulties   Cognition Arousal/Alertness: Awake/alert Behavior During Therapy: Impulsive Overall Cognitive Status: Impaired/Different from baseline Area of Impairment: Following commands, Safety/judgement, Memory, Awareness, Problem solving                     Memory: Decreased short-term memory, Decreased recall of precautions Following Commands: Follows one step commands consistently Safety/Judgement: Decreased awareness of safety, Decreased awareness of deficits Awareness: Intellectual Problem Solving: Requires verbal cues, Difficulty sequencing General Comments: "I am a little wobbly"  no brace present, Pt impulsive stating "I will take this IV out myself" Pt with decreased safety awareness during mobility, ADL, requiring assist, but no awareness of how much assist he needs.     General Comments       Exercises     Shoulder Instructions      Home Living Family/patient expects to be discharged to:: Private residence Living Arrangements: Alone Available Help at Discharge: Friend(s);Available PRN/intermittently Type of Home: House Home Access: Level entry Entrance Stairs-Number of Steps: 1 (threshold)  Home Layout: Two level;Able to live on main level with bedroom/bathroom Alternate Level Stairs-Number of Steps: 12 steps to access basement Alternate Level Stairs-Rails: Right;Left Bathroom Shower/Tub: Tub/shower unit;Walk-in shower   Bathroom Toilet: Standard     Home Equipment: Adaptive equipment;Rolling Environmental consultant (2 wheels);Cane - single point Adaptive Equipment: Long-handled sponge Additional Comments: Prior to CVA, pt was active, worked out at gym 5x/week; Since DC home from [employer] has been mostly in bed, refusing to bathe/brush teeth. Significant other is not able to provide 24/7 assist.  Lives With: Alone    Prior Functioning/Environment Prior Level of Function : Independent/Modified Independent;Driving;Working/employed             Mobility Comments: after DC from [facility] 11/28/21 was using a RW, no AD prior to CVA in October ADLs Comments: friend reports he refused to bathe/brush teeth after DC home from [facility] 11/28/21        OT Problem List: Decreased activity tolerance;Impaired balance (sitting and/or standing);Decreased cognition;Decreased safety awareness;Decreased knowledge of use of DME or AE;Pain      OT Treatment/Interventions: Self-care/ADL training;Therapeutic activities;Cognitive remediation/compensation;Neuromuscular education;DME and/or AE instruction;Patient/family education;Balance training    OT Goals(Current goals can be found in the care plan section) Acute Rehab OT Goals Patient Stated Goal: decrease back pain OT Goal Formulation: With patient Time For Goal Achievement: 12/26/21 Potential to Achieve Goals: Good ADL Goals Pt Will Perform Grooming: with supervision;standing Pt Will Perform Upper Body Dressing: with modified independence;sitting Pt Will Perform Lower Body Dressing: with modified independence;sit to/from stand Pt Will Transfer to Toilet: with modified independence;ambulating Pt Will Perform Toileting - Clothing Manipulation and hygiene:  with modified independence;sit to/from stand  OT Frequency: Min 2X/week    Co-evaluation              AM-PAC OT "6 Clicks" Daily Activity     Outcome Measure Help from another person eating meals?: None Help from another person taking care of personal grooming?: A Little Help from another person toileting, which includes using toliet, bedpan, or urinal?: A Lot Help from another person bathing (including washing, rinsing, drying)?: A Lot Help from another person to put on and taking off regular upper body clothing?: A Little Help from another person to put on and taking off regular lower body clothing?: A Lot 6 Click Score: 16   End of Session Equipment Utilized During Treatment: Gait belt;Rolling walker (2 wheels) Nurse Communication: Mobility status (bowel movement, Pt threatening to take out IV)  Activity Tolerance: Patient tolerated treatment well Patient left: in bed;with call bell/phone within reach;with bed alarm set  OT Visit Diagnosis: Unsteadiness on feet (R26.81);Other symptoms and signs involving cognitive function;Pain;Muscle weakness (generalized) (M62.81) Pain - Right/Left: Right Pain - part of body: Shoulder (lower back)                Time: 2458-0998 OT Time Calculation (min): 35 min Charges:  OT General Charges $OT Visit: 1 Visit OT Evaluation $OT Eval Moderate Complexity: 1 Mod OT Treatments $Self Care/Home Management : 8-22 mins  Jesse Sans OTR/L Acute Rehabilitation Services Office: [place] 12/12/2021, 10:17 AM

## 2021-12-12 NOTE — Progress Notes (Signed)
Physical Therapy Treatment Patient Details Name: Jose [name] MRN: [id number] DOB: 1935/08/19 Today's Date: 12/12/2021   History of Present Illness Jose [name] is a 86 y.o. male admitted 11/16 with nausea, vomiting, diarrhea, fever and mild confusion. PMH significant for recent L3 compression fx requiring TLSO, right knee acute meniscal tears, osteoarthritis, urticaria, IBS, mild sleep apnea not on CPAP, neuromuscular system disorder, spinal stenosis, GERD, chronic fatigue syndrome, alcohol abuse, thiamine deficiency who was recently hospitalized 10/23-10/29 for acute CVA after which he was in inpatient rehab for a week and was discharged to home.    PT Comments    Pt assisted with ambulating in hallway and performed a couple balance activities to challenge balance.  Pt not able to perform challenges to balance without good UE support.  Pt presents with poor safety awareness and decreased insight into deficits.  Continue to recommend AIR if pt agreeable.  Would recommend 24/7 supervision upon d/c as pt presents as HIGH fall risk.    Recommendations for follow up therapy are one component of a multi-disciplinary discharge planning process, led by the attending physician.  Recommendations may be updated based on patient status, additional functional criteria and insurance authorization.  Follow Up Recommendations  Acute inpatient rehab (3hours/day) Can patient physically be transported by private vehicle: Yes   Assistance Recommended at Discharge Frequent or constant Supervision/Assistance  Patient can return home with the following A little help with walking and/or transfers;A little help with bathing/dressing/bathroom;Assistance with cooking/housework;Assist for transportation;Help with stairs or ramp for entrance;Direct supervision/assist for financial management;Direct supervision/assist for medications management   Equipment Recommendations  None recommended by PT     Recommendations for Other Services       Precautions / Restrictions Precautions Precautions: Fall;Back Precaution Comments: fell ~12/02/21 (presented to outpt MD 12/02/21 with back pain, L3 compression fx per imaging)     Mobility  Bed Mobility Overal bed mobility: Needs Assistance Bed Mobility: Supine to Sit, Sit to Sidelying   Sidelying to sit: Min assist   Sit to supine: Mod assist, HOB elevated   General bed mobility comments: pt did not recall log roll technique from earlier and performing supine to sit so assisted with trunk upright; returned to bed in more sidelying position but required assist for LEs    Transfers Overall transfer level: Needs assistance Equipment used: Rolling walker (2 wheels) Transfers: Sit to/from Stand Sit to Stand: Min assist           General transfer comment: min assist to rise and steady due to posterior bias    Ambulation/Gait Ambulation/Gait assistance: Min assist Gait Distance (Feet): 120 Feet Assistive device: Rolling walker (2 wheels) Gait Pattern/deviations: Step-through pattern, Decreased stride length Gait velocity: decreased     General Gait Details: occasional min assist for stabilizing   Stairs             Wheelchair Mobility    Modified Rankin (Stroke Patients Only)       Balance Overall balance assessment: Needs assistance             Standing balance comment: Pt requiring UE support or physical assist to perform any challenges to balance such as tandem, SLS, eyes closed (reliant on RW with firm grip for support/stability, requiring physical assist if releasing hold on RW)                            Cognition Arousal/Alertness: Awake/alert Behavior During Therapy: Impulsive  Overall Cognitive Status: Impaired/Different from baseline Area of Impairment: Following commands, Safety/judgement, Memory, Awareness, Problem solving                     Memory: Decreased short-term  memory, Decreased recall of precautions Following Commands: Follows one step commands consistently Safety/Judgement: Decreased awareness of safety, Decreased awareness of deficits Awareness: Intellectual Problem Solving: Requires verbal cues, Difficulty sequencing          Exercises      General Comments        Pertinent Vitals/Pain Pain Assessment Pain Assessment: Faces Faces Pain Scale: Hurts little more Pain Location: low back Pain Descriptors / Indicators: Grimacing, Guarding Pain Intervention(s): Repositioned, Monitored during session    Home Living                          Prior Function            PT Goals (current goals can now be found in the care plan section) Progress towards PT goals: Progressing toward goals    Frequency    Min 3X/week      PT Plan Current plan remains appropriate    Co-evaluation              AM-PAC PT "6 Clicks" Mobility   Outcome Measure  Help needed turning from your back to your side while in a flat bed without using bedrails?: A Little Help needed moving from lying on your back to sitting on the side of a flat bed without using bedrails?: A Little Help needed moving to and from a bed to a chair (including a wheelchair)?: A Little Help needed standing up from a chair using your arms (e.g., wheelchair or bedside chair)?: A Little Help needed to walk in hospital room?: A Little Help needed climbing 3-5 steps with a railing? : A Lot 6 Click Score: 17    End of Session Equipment Utilized During Treatment: Gait belt Activity Tolerance: Patient tolerated treatment well Patient left: in bed;with call bell/phone within reach;with bed alarm set   PT Visit Diagnosis: Difficulty in walking, not elsewhere classified (R26.2);Unsteadiness on feet (R26.81)     Time: 3614-4315 PT Time Calculation (min) (ACUTE ONLY): 17 min  Charges:  $Gait Training: 8-22 mins                     Jannette Spanner PT, DPT Physical  Therapist Acute Rehabilitation Services Preferred contact method: Secure Chat Weekend Pager Only: [phone] Office: [place] 12/12/2021, 4:08 PM

## 2021-12-13 DIAGNOSIS — A419 Sepsis, unspecified organism: Secondary | ICD-10-CM | POA: Diagnosis not present

## 2021-12-13 DIAGNOSIS — R6520 Severe sepsis without septic shock: Secondary | ICD-10-CM | POA: Diagnosis not present

## 2021-12-13 LAB — CULTURE, BLOOD (ROUTINE X 2): Culture: NO GROWTH

## 2021-12-13 MED ORDER — GUAIFENESIN ER 600 MG PO TB12: 600.0000 mg | ORAL_TABLET | Freq: Two times a day (BID) | ORAL | 0 refills | Status: AC

## 2021-12-13 NOTE — Discharge Summary (Signed)
Physician Discharge Summary  Jose [name] LNL:[id number] DOB: [birth year] DOA: 12/08/2021  PCP: Hoyt Koch, MD  Admit date: 12/08/2021 Discharge date: 12/13/2021  Admitted From: Home Discharge disposition: Home with home health PT  Recommendations at discharge:  Encourage ambulation, participation with PT at home    Brief narrative: Jose [name] is a 86 y.o. male with PMH significant for right knee acute meniscal tears, osteoarthritis, urticaria, IBS, mild sleep apnea not on CPAP, neuromuscular system disorder, spinal stenosis, GERD, chronic fatigue syndrome, alcohol abuse, thiamine deficiency who was recently hospitalized 10/23-10/29 for acute CVA after which she was in inpatient rehab for a week and was discharged to home.   11/16, patient presented to the ED with nausea, vomiting, diarrhea, fever and mild confusion.  In the ED, he had a fever of 103.9, heart rate elevated to 130s, blood pressure fluctuating, as low as 90s, O2 sat 88% on room air. Labs with WBC count 14.2, lactic acid 2, hemoglobin 14.9, platelet 200, BUN/creatinine 15/0.97, Rest virus panel negative for COVID, flu.  Strep pneumonia urinary antigen negative Urinalysis clear yellow urine 20 ketones, negative nitrate or leukocytes Blood culture and urine culture sent. CT chest, abdomen and pelvis were obtained.  It showed mild atelectasis/pneumonia in the lung bases.  Rectal fluid level correlating with diarrhea without bowel wall inflammation. Patient was started on IV fluid, broad-spectrum IV antibiotics.   Subjective: Patient was seen and examined this morning.  Lying on bed.  Not in distress.  Patient is refusing to go to rehab. Wants to go home with home health PT.  Hospital course: Severe sepsis  Community-acquired pneumonia  Presented with nausea, vomiting, diarrhea, confusion, fever of 100.9, WBC count of 14.2 and lactic acid elevated to 2 Imagings suggestive of bibasilar  pneumonia Blood culture and urine culture did not show any growth Completed 5-day course of IV Rocephin, azithromycin No recurrence of fever.   WBC count and lactic acid level normalized. Recent Labs  Lab 12/08/21 0438 12/08/21 0638 12/09/21 0457  WBC 14.2*  --  6.1  LATICACIDVEN 2.0* 0.9  --    Acute respiratory failure with hypoxia  Initially required up to 3 L oxygen by nasal cannula.  Gradually wean down.  Currently not on supplemental oxygen.  Acute metabolic encephalopathy Patient was confused initially probably due to sepsis.  Mental status improved  Diarrhea Likely because of viral syndrome.  Improved   History of CVA/HLD Continue aspirin and atorvastatin.   Thiamine deficiency Continue thiamine supplementation.   GERD (gastroesophageal reflux disease) Continue PPI.  Anxiety/depression Celexa 20 mg daily, Xanax 0.5 mg at bedtime as needed  Constipation Dulcolax as needed  Impaired mobility Fall Compression fracture of the lumbar vertebrae PT eval obtained.  CIR recommended. Continue pain management  Wounds:  -    Discharge Exam:   Vitals:   12/12/21 0654 12/12/21 1326 12/12/21 2056 12/13/21 0631  BP: 128/85 117/69 (!) 141/86 (!) 142/90  Pulse: 88 82 82 91  Resp: '20 18 20 20  '$ Temp: 98.5 F (36.9 C) 98.3 F (36.8 C) 98.1 F (36.7 C) 98 F (36.7 C)  TempSrc: Oral Oral Oral Oral  SpO2: 94% 98% 95% 93%  Weight:    66.4 kg  Height:        Body mass index is 21 kg/m.  General exam: Pleasant, elderly Caucasian male.  Not in physical distress skin: No rashes, lesions or ulcers. HEENT: Atraumatic, normocephalic, no obvious bleeding Lungs: Clear to auscultation bilaterally.  CVS: Regular rate and rhythm, no murmur GI/Abd soft, nontender, nondistended, bowels are present CNS: Alert, awake, oriented x3 Psychiatry: Mood appropriate Extremities: No pedal edema, no calf tenderness  Follow ups:    Follow-up [facility].    Why: [facility]/occupational therapy. Chery rep-[phone] Contact information: [address] [place] [address] [phone]         Hoyt Koch, MD Follow up.   Specialty: Internal Medicine Contact information: [place] [address] [phone]                 Discharge Instructions:   Discharge Instructions     Call MD for:  difficulty breathing, headache or visual disturbances   Complete by: As directed    Call MD for:  extreme fatigue   Complete by: As directed    Call MD for:  hives   Complete by: As directed    Call MD for:  persistant dizziness or light-headedness   Complete by: As directed    Call MD for:  persistant nausea and vomiting   Complete by: As directed    Call MD for:  severe uncontrolled pain   Complete by: As directed    Call MD for:  temperature >100.4   Complete by: As directed    Diet general   Complete by: As directed    Discharge instructions   Complete by: As directed    Recommendations at discharge:   Encourage ambulation, participation with PT at home   General discharge instructions: Follow with Primary MD Hoyt Koch, MD in 7 days  Please request your PCP  to go over your hospital tests, procedures, radiology results at the follow up. Please get your medicines reviewed and adjusted.  Your PCP may decide to repeat certain labs or tests as needed. Do not drive, operate heavy machinery, perform activities at heights, swimming or participation in water activities or provide baby sitting services if your were admitted for syncope or siezures until you have seen by Primary MD or a Neurologist and advised to do so again. [place] Controlled Substance Reporting System database was reviewed. Do not drive, operate heavy machinery, perform activities at heights, swim, participate in water activities or provide baby-sitting services while on medications for pain, sleep and mood  until your outpatient physician has reevaluated you and advised to do so again.  You are strongly recommended to comply with the dose, frequency and duration of prescribed medications. Activity: As tolerated with Full fall precautions use walker/cane & assistance as needed Avoid using any recreational substances like cigarette, tobacco, alcohol, or non-prescribed drug. If you experience worsening of your admission symptoms, develop shortness of breath, life threatening emergency, suicidal or homicidal thoughts you must seek medical attention immediately by calling 911 or calling your MD immediately  if symptoms less severe. You must read complete instructions/literature along with all the possible adverse reactions/side effects for all the medicines you take and that have been prescribed to you. Take any new medicine only after you have completely understood and accepted all the possible adverse reactions/side effects.  Wear Seat belts while driving. You were cared for by a hospitalist during your hospital stay. If you have any questions about your discharge medications or the care you received while you were in the hospital after you are discharged, you can call the unit and ask to speak with the hospitalist or the covering physician. Once you  are discharged, your primary care physician will handle any further medical issues. Please note that NO REFILLS for any discharge medications will be authorized once you are discharged, as it is imperative that you return to your primary care physician (or establish a relationship with a primary care physician if you do not have one).   Increase activity slowly   Complete by: As directed        Discharge Medications:   Allergies as of 12/13/2021   No Known Allergies      Medication List     STOP taking these medications    oxybutynin 10 MG 24 hr tablet Commonly known as: Ditropan XL   traZODone 100 MG tablet Commonly known as: DESYREL        TAKE these medications    acetaminophen 325 MG tablet Commonly known as: TYLENOL Take 1-2 tablets (325-650 mg total) by mouth every 6 (six) hours as needed for mild pain.   ALPRAZolam 0.5 MG tablet Commonly known as: XANAX Take 0.5 mg by mouth at bedtime as needed for anxiety.   ascorbic acid 500 MG tablet Commonly known as: VITAMIN C Take 1 tablet (500 mg total) by mouth daily.   aspirin 81 MG chewable tablet Chew 1 tablet (81 mg total) by mouth daily.   atorvastatin 40 MG tablet Commonly known as: LIPITOR Take 1 tablet (40 mg total) by mouth daily.   bisacodyl 5 MG EC tablet Commonly known as: DULCOLAX Take 1 tablet (5 mg total) by mouth daily as needed for moderate constipation.   citalopram 20 MG tablet Commonly known as: CELEXA Take 1 tablet (20 mg total) by mouth daily.   docusate sodium 100 MG capsule Commonly known as: COLACE Take 1 capsule (100 mg total) by mouth 2 (two) times daily as needed for mild constipation.   guaiFENesin 600 MG 12 hr tablet Commonly known as: MUCINEX Take 1 tablet (600 mg total) by mouth 2 (two) times daily for 7 days.   multivitamin with minerals Tabs tablet Take 1 tablet by mouth daily.   omeprazole 20 MG capsule Commonly known as: PRILOSEC Take 1 capsule (20 mg total) by mouth every morning.   thiamine 100 MG tablet Commonly known as: Vitamin B-1 Take 1 tablet (100 mg total) by mouth daily.         The results of significant diagnostics from this hospitalization (including imaging, microbiology, ancillary and laboratory) are listed below for reference.    Procedures and Diagnostic Studies:   CT ABDOMEN PELVIS W CONTRAST  Result Date: 12/08/2021 CLINICAL DATA:  Abnormal x-ray/lung nodule. Nausea, vomiting, and diarrhea since early morning. Ethanol withdrawal. EXAM: CT CHEST, ABDOMEN, AND PELVIS WITH CONTRAST TECHNIQUE: Multidetector CT imaging of the chest, abdomen and pelvis was performed following the standard  protocol during bolus administration of intravenous contrast. RADIATION DOSE REDUCTION: This exam was performed according to the departmental dose-optimization program which includes automated exposure control, adjustment of the mA and/or kV according to patient size and/or use of iterative reconstruction technique. CONTRAST:  132m OMNIPAQUE IOHEXOL 300 MG/ML  SOLN COMPARISON:  None Available. FINDINGS: CT CHEST FINDINGS Cardiovascular: Normal heart size. No pericardial effusion. Atheromatous calcification of the aorta and coronaries. No acute vascular finding Mediastinum/Nodes: No hematoma or adenopathy Lungs/Pleura: Dependent pulmonary opacity which could be atelectasis or pneumonia. Mild subpleural reticulation which could be atelectasis or mild interstitial lung disease, no honeycombing. Small pleural based calcification in the upper and posterior right chest; no generalized calcified pleural plaques. There is some  pleural based thickening along the lateral left chest wall which is non worrisome fat density on 2:27 Musculoskeletal: Generalized thoracic spine degeneration. Notably severe right glenohumeral osteoarthritis. No acute finding. CT ABDOMEN PELVIS FINDINGS Hepatobiliary: No focal liver abnormality.No evidence of biliary obstruction or stone. Pancreas: Unremarkable. Spleen: Unremarkable. Adrenals/Urinary Tract: Negative adrenals. No hydronephrosis or stone. Unremarkable bladder. Stomach/Bowel: No obstruction. No visible bowel inflammation. High-density in the stomach and rectum attributed to ingested material. Colonic diverticulosis. Vascular/Lymphatic: No acute vascular abnormality. Extensive atheromatous calcification. No mass or adenopathy. Reproductive:No pathologic findings. Other: No ascites or pneumoperitoneum. Musculoskeletal: Advanced lumbar spine degeneration with mild scoliosis. Superior endplate fracture with mild height loss at L3, favored subacute. IMPRESSION: 1. Atelectasis or pneumonia  in the dependent lungs, mild. 2. Rectal fluid level correlating with history of diarrhea. No visible bowel wall inflammation. 3. L3 superior endplate fracture with mild height loss, favor subacute timing. 4. Atherosclerosis including the coronary arteries. Electronically Signed   By: Jorje Guild M.D.   On: 12/08/2021 06:34   CT Chest W Contrast  Result Date: 12/08/2021 CLINICAL DATA:  Abnormal x-ray/lung nodule. Nausea, vomiting, and diarrhea since early morning. Ethanol withdrawal. EXAM: CT CHEST, ABDOMEN, AND PELVIS WITH CONTRAST TECHNIQUE: Multidetector CT imaging of the chest, abdomen and pelvis was performed following the standard protocol during bolus administration of intravenous contrast. RADIATION DOSE REDUCTION: This exam was performed according to the departmental dose-optimization program which includes automated exposure control, adjustment of the mA and/or kV according to patient size and/or use of iterative reconstruction technique. CONTRAST:  159m OMNIPAQUE IOHEXOL 300 MG/ML  SOLN COMPARISON:  None Available. FINDINGS: CT CHEST FINDINGS Cardiovascular: Normal heart size. No pericardial effusion. Atheromatous calcification of the aorta and coronaries. No acute vascular finding Mediastinum/Nodes: No hematoma or adenopathy Lungs/Pleura: Dependent pulmonary opacity which could be atelectasis or pneumonia. Mild subpleural reticulation which could be atelectasis or mild interstitial lung disease, no honeycombing. Small pleural based calcification in the upper and posterior right chest; no generalized calcified pleural plaques. There is some pleural based thickening along the lateral left chest wall which is non worrisome fat density on 2:27 Musculoskeletal: Generalized thoracic spine degeneration. Notably severe right glenohumeral osteoarthritis. No acute finding. CT ABDOMEN PELVIS FINDINGS Hepatobiliary: No focal liver abnormality.No evidence of biliary obstruction or stone. Pancreas:  Unremarkable. Spleen: Unremarkable. Adrenals/Urinary Tract: Negative adrenals. No hydronephrosis or stone. Unremarkable bladder. Stomach/Bowel: No obstruction. No visible bowel inflammation. High-density in the stomach and rectum attributed to ingested material. Colonic diverticulosis. Vascular/Lymphatic: No acute vascular abnormality. Extensive atheromatous calcification. No mass or adenopathy. Reproductive:No pathologic findings. Other: No ascites or pneumoperitoneum. Musculoskeletal: Advanced lumbar spine degeneration with mild scoliosis. Superior endplate fracture with mild height loss at L3, favored subacute. IMPRESSION: 1. Atelectasis or pneumonia in the dependent lungs, mild. 2. Rectal fluid level correlating with history of diarrhea. No visible bowel wall inflammation. 3. L3 superior endplate fracture with mild height loss, favor subacute timing. 4. Atherosclerosis including the coronary arteries. Electronically Signed   By: JJorje GuildM.D.   On: 12/08/2021 06:34   DG Chest 2 View  Result Date: 12/08/2021 CLINICAL DATA:  Suspected sepsis. EXAM: CHEST - 2 VIEW COMPARISON:  Portable chest 11/14/2021 FINDINGS: Provided are AP and lateral views. There are multiple overlying monitor wires. The heart is slightly enlarged. There is mild perihilar vascular congestion. There is mild interstitial edema in the lung bases and trace pleural effusions. There is a low inspiration. There are patchy hazy opacities medially in the lower lung fields which could  be atelectasis, ground-glass edema, pneumonia or combination. The upper lung zones are clear of focal infiltrate. The mediastinum is normally outlined. Osteopenia and mild thoracic dextroscoliosis. IMPRESSION: 1. Mild cardiomegaly, vascular congestion and mild interstitial edema. 2. Patchy hazy opacities medially in the lower lung fields which could be atelectasis, ground-glass edema, pneumonia or combination. 3. Clinical correlation and radiographic follow-up  recommended. Electronically Signed   By: Telford Nab M.D.   On: 12/08/2021 05:36     Labs:   Basic Metabolic Panel: Recent Labs  Lab 12/08/21 0438 12/09/21 0457  NA 136 139  K 3.7 3.6  CL 97* 107  CO2 27 27  GLUCOSE 125* 89  BUN 15 11  CREATININE 0.97 0.80  CALCIUM 10.0 9.1   GFR Estimated Creatinine Clearance: 64.6 mL/min (by C-G formula based on SCr of 0.8 mg/dL). Liver Function Tests: Recent Labs  Lab 12/08/21 0438 12/09/21 0457  AST 26 17  ALT 24 16  ALKPHOS 80 51  BILITOT 0.8 0.6  PROT 7.7 5.5*  ALBUMIN 3.9 2.7*   No results for input(s): "LIPASE", "AMYLASE" in the last 168 hours. No results for input(s): "AMMONIA" in the last 168 hours. Coagulation profile Recent Labs  Lab 12/08/21 0438  INR 1.1    CBC: Recent Labs  Lab 12/08/21 0438 12/09/21 0457  WBC 14.2* 6.1  NEUTROABS 12.6*  --   HGB 14.9 11.2*  HCT 45.4 34.2*  MCV 90.4 91.7  PLT 200 146*   Cardiac Enzymes: No results for input(s): "CKTOTAL", "CKMB", "CKMBINDEX", "TROPONINI" in the last 168 hours. BNP: Invalid input(s): "POCBNP" CBG: No results for input(s): "GLUCAP" in the last 168 hours. D-Dimer No results for input(s): "DDIMER" in the last 72 hours. Hgb A1c No results for input(s): "HGBA1C" in the last 72 hours. Lipid Profile No results for input(s): "CHOL", "HDL", "LDLCALC", "TRIG", "CHOLHDL", "LDLDIRECT" in the last 72 hours. Thyroid function studies No results for input(s): "TSH", "T4TOTAL", "T3FREE", "THYROIDAB" in the last 72 hours.  Invalid input(s): "FREET3" Anemia work up No results for input(s): "VITAMINB12", "FOLATE", "FERRITIN", "TIBC", "IRON", "RETICCTPCT" in the last 72 hours. Microbiology Recent Results (from the past 240 hour(s))  Culture, blood (Routine x 2)     Status: None   Collection Time: 12/08/21  4:38 AM   Specimen: BLOOD RIGHT ARM  Result Value Ref Range Status   Specimen Description   Final    BLOOD RIGHT ARM Performed at [facility] [address] [place], [place] [address]    Special Requests   Final    BOTTLES DRAWN AEROBIC AND ANAEROBIC Blood Culture results may not be optimal due to an excessive volume of blood received in culture bottles Performed at [facility] [address], [place], [place] [address]    Culture   Final    NO GROWTH 5 DAYS Performed at [facility], [place] [address], [place], [place] [address]    Report Status 12/13/2021 FINAL  Final  Resp Panel by RT-PCR (Flu A&B, Covid) Anterior Nasal Swab     Status: None   Collection Time: 12/08/21  4:40 AM   Specimen: Anterior Nasal Swab  Result Value Ref Range Status   SARS Coronavirus 2 by RT PCR NEGATIVE NEGATIVE Final    Comment: (NOTE) SARS-CoV-2 target nucleic acids are NOT DETECTED.  The SARS-CoV-2 RNA is generally detectable in upper respiratory specimens during the acute phase of infection. The lowest concentration of SARS-CoV-2 viral copies this assay can detect is  138 copies/mL. A negative result does not preclude SARS-Cov-2 infection and should not be used as the sole basis for treatment or other patient management decisions. A negative result may occur with  improper specimen collection/handling, submission of specimen other than nasopharyngeal swab, presence of viral mutation(s) within the areas targeted by this assay, and inadequate number of viral copies(<138 copies/mL). A negative result must be combined with clinical observations, patient history, and epidemiological information. The expected result is Negative.  Fact Sheet for Patients:  EntrepreneurPulse.com.au  Fact Sheet for Healthcare Providers:  IncredibleEmployment.be  This test is no t yet approved or cleared by the [place] FDA and  has been authorized for detection and/or diagnosis of SARS-CoV-2 by FDA under an Emergency Use Authorization (EUA). This EUA will remain  in effect (meaning  this test can be used) for the duration of the COVID-19 declaration under Section 564(b)(1) of the Act, 21 U.S.C.section 360bbb-3(b)(1), unless the authorization is terminated  or revoked sooner.       Influenza A by PCR NEGATIVE NEGATIVE Final   Influenza B by PCR NEGATIVE NEGATIVE Final    Comment: (NOTE) The Xpert Xpress SARS-CoV-2/FLU/RSV plus assay is intended as an aid in the diagnosis of influenza from Nasopharyngeal swab specimens and should not be used as a sole basis for treatment. Nasal washings and aspirates are unacceptable for Xpert Xpress SARS-CoV-2/FLU/RSV testing.  Fact Sheet for Patients: EntrepreneurPulse.com.au  Fact Sheet for Healthcare Providers: IncredibleEmployment.be  This test is not yet approved or cleared by the [place] FDA and has been authorized for detection and/or diagnosis of SARS-CoV-2 by FDA under an Emergency Use Authorization (EUA). This EUA will remain in effect (meaning this test can be used) for the duration of the COVID-19 declaration under Section 564(b)(1) of the Act, 21 U.S.C. section 360bbb-3(b)(1), unless the authorization is terminated or revoked.  Performed at [facility], [place] [address] [place], [place] [address]   Culture, blood (Routine x 2)     Status: Abnormal   Collection Time: 12/08/21  4:53 AM   Specimen: BLOOD  Result Value Ref Range Status   Specimen Description   Final    BLOOD LEFT ANTECUBITAL Performed at [facility] [address], [place], [place] [address]    Special Requests   Final    BOTTLES DRAWN AEROBIC AND ANAEROBIC Blood Culture adequate volume Performed at [facility] [address] [place], [place] [address]    Culture  Setup Time   Final    GRAM POSITIVE COCCI IN CLUSTERS AEROBIC BOTTLE ONLY CRITICAL RESULT CALLED TO, READ BACK BY AND VERIFIED WITH: PHARMD ANH PHAM ON 12/09/21 @ 1718 BY DRT     Culture (A)  Final    STAPHYLOCOCCUS CAPITIS THE SIGNIFICANCE OF ISOLATING THIS ORGANISM FROM A SINGLE SET OF BLOOD CULTURES WHEN MULTIPLE SETS ARE DRAWN IS UNCERTAIN. PLEASE NOTIFY THE MICROBIOLOGY DEPARTMENT WITHIN ONE WEEK IF SPECIATION AND SENSITIVITIES ARE REQUIRED. Performed at [facility], [place] [address] [place], [place] [address]    Report Status 12/10/2021 FINAL  Final  Blood Culture ID Panel (Reflexed)     Status: Abnormal   Collection Time: 12/08/21  4:53 AM  Result Value Ref Range Status   Enterococcus faecalis NOT DETECTED NOT DETECTED Final   Enterococcus Faecium NOT DETECTED NOT DETECTED Final   Listeria monocytogenes NOT DETECTED NOT DETECTED Final   Staphylococcus species DETECTED (A) NOT DETECTED Final    Comment: CRITICAL RESULT CALLED  TO, READ BACK BY AND VERIFIED WITH: PHARMD ANH PHAM ON 12/09/21 @ 1718 BY DRT    Staphylococcus aureus (BCID) NOT DETECTED NOT DETECTED Final   Staphylococcus epidermidis NOT DETECTED NOT DETECTED Final   Staphylococcus lugdunensis NOT DETECTED NOT DETECTED Final   Streptococcus species NOT DETECTED NOT DETECTED Final   Streptococcus agalactiae NOT DETECTED NOT DETECTED Final   Streptococcus pneumoniae NOT DETECTED NOT DETECTED Final   Streptococcus pyogenes NOT DETECTED NOT DETECTED Final   A.calcoaceticus-baumannii NOT DETECTED NOT DETECTED Final   Bacteroides fragilis NOT DETECTED NOT DETECTED Final   Enterobacterales NOT DETECTED NOT DETECTED Final   Enterobacter cloacae complex NOT DETECTED NOT DETECTED Final   Escherichia coli NOT DETECTED NOT DETECTED Final   Klebsiella aerogenes NOT DETECTED NOT DETECTED Final   Klebsiella oxytoca NOT DETECTED NOT DETECTED Final   Klebsiella pneumoniae NOT DETECTED NOT DETECTED Final   Proteus species NOT DETECTED NOT DETECTED Final   Salmonella species NOT DETECTED NOT DETECTED Final   Serratia marcescens NOT DETECTED NOT DETECTED Final   Haemophilus influenzae NOT DETECTED NOT  DETECTED Final   Neisseria meningitidis NOT DETECTED NOT DETECTED Final   Pseudomonas aeruginosa NOT DETECTED NOT DETECTED Final   Stenotrophomonas maltophilia NOT DETECTED NOT DETECTED Final   Candida albicans NOT DETECTED NOT DETECTED Final   Candida auris NOT DETECTED NOT DETECTED Final   Candida glabrata NOT DETECTED NOT DETECTED Final   Candida krusei NOT DETECTED NOT DETECTED Final   Candida parapsilosis NOT DETECTED NOT DETECTED Final   Candida tropicalis NOT DETECTED NOT DETECTED Final   Cryptococcus neoformans/gattii NOT DETECTED NOT DETECTED Final    Comment: Performed at [facility], [address] [place], [place] [address]  Urine Culture     Status: None   Collection Time: 12/08/21  5:00 AM   Specimen: In/Out Cath Urine  Result Value Ref Range Status   Specimen Description   Final    IN/OUT CATH URINE Performed at [facility] [address], [place], [place] [address]    Special Requests   Final    Normal Performed at [facility], [place] [address], [place], [place] [address]    Culture   Final    NO GROWTH Performed at [facility], [place] [address] [place], [place] [address]    Report Status 12/09/2021 FINAL  Final    Time coordinating discharge: 35 minutes  Signed: [place] [place]  [facility] 12/13/2021, 11:26 AM
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
## 2021-12-13 NOTE — Care Management Important Message (Signed)
Important Message  Patient Details IM Letter given Name: Jose [name] MRN: [id number] Date of Birth: Feb 21, 1935   Medicare Important Message Given:  Yes     Kerin Salen 12/13/2021, 11:22 AM

## 2021-12-13 NOTE — TOC Transition Note (Addendum)
Transition of Care [facility]) - CM/SW Discharge Note   Patient Details  Name: Jose [name] MRN: [id number] Date of Birth: 06-08-1935  Transition of Care [facility]) CM/SW Contact:  Dessa Phi, RN Phone Number: 12/13/2021, 11:16 AM   Clinical Narrative:  Patient decline CIR-patient agree to home w/HHPT/OT-no preference. [facility] HHPT/OT rep Malachy Mood. Has own transport home.No further CM needs. -12:52p-added [place] aide to [facility].     Final next level of care: [place] Barriers to Discharge: No Barriers Identified   Patient Goals and CMS Choice Patient states their goals for this hospitalization and ongoing recovery are::  (Home) CMS Medicare.gov Compare Post Acute Care list provided to:: Patient Choice offered to / list presented to : Patient  Discharge Placement                       Discharge Plan and Services   Discharge Planning Services: CM Consult Post Acute Care Choice: Home Health                    HH Arranged: PT, OT [facility] Agency: [place] Date [place]: 12/13/21 Time HH Agency Contacted: 73 Representative spoke with at [place]: Malachy Mood  Social Determinants of Health ([place]) Interventions     Readmission Risk Interventions     No data to display

## 2021-12-13 NOTE — Plan of Care (Signed)
  Problem: Education: Goal: Knowledge of General Education information will improve Description Including pain rating scale, medication(s)/side effects and non-pharmacologic comfort measures Outcome: Progressing   Problem: Health Behavior/Discharge Planning: Goal: Ability to manage health-related needs will improve Outcome: Progressing   

## 2021-12-14 ENCOUNTER — Telehealth: Payer: Self-pay

## 2021-12-14 NOTE — Telephone Encounter (Signed)
Transition Care Management Follow-up Telephone Call Date of discharge and from where: 12/13/21; [facility]  How have you been since you were released from the hospital? Stable no additional falls  Any questions or concerns? No  Items Reviewed: Did the pt receive and understand the discharge instructions provided? Yes  Medications obtained and verified? Yes  Other?  N/a  Any new allergies since your discharge? No  Dietary orders reviewed? Yes Do you have support at home? Yes   Home Care and Equipment/Supplies: Were home health services ordered? yes If so, what is the name of the agency? [facility] ([place] [phone])  Has the agency set up a time to come to the patient's home? yes Were any new equipment or medical supplies ordered?  No What is the name of the medical supply agency? N/a Were you able to get the supplies/equipment? not applicable Do you have any questions related to the use of the equipment or supplies? No  Functional Questionnaire: (I = Independent and D = Dependent) ADLs: D  Bathing/Dressing- I  Meal Prep- I  Eating- I  Maintaining continence- I  Transferring/Ambulation- I  Managing Meds- I with limited assistance   Follow up appointments reviewed:  PCP Hospital f/u appt confirmed? Yes  Scheduled to see Dr. Sharlet Salina on 12/19/21 @ 3:20. [facility] f/u appt confirmed?  N/a    Are transportation arrangements needed? No  If their condition worsens, is the pt aware to call PCP or go to the Emergency Dept.? Yes Was the patient provided with contact information for the PCP's office or ED? Yes Was to pt encouraged to call back with questions or concerns? Yes

## 2021-12-19 ENCOUNTER — Encounter: Payer: Self-pay | Admitting: Internal Medicine

## 2021-12-19 ENCOUNTER — Ambulatory Visit (INDEPENDENT_AMBULATORY_CARE_PROVIDER_SITE_OTHER): Payer: Medicare Other | Admitting: Internal Medicine

## 2021-12-19 VITALS — BP 100/68 | HR 41 | Temp 98.6°F | Ht 70.0 in | Wt 147.0 lb

## 2021-12-19 DIAGNOSIS — F1010 Alcohol abuse, uncomplicated: Secondary | ICD-10-CM

## 2021-12-19 DIAGNOSIS — I639 Cerebral infarction, unspecified: Secondary | ICD-10-CM

## 2021-12-19 DIAGNOSIS — R270 Ataxia, unspecified: Secondary | ICD-10-CM | POA: Diagnosis not present

## 2021-12-19 DIAGNOSIS — Z8673 Personal history of transient ischemic attack (TIA), and cerebral infarction without residual deficits: Secondary | ICD-10-CM

## 2021-12-19 DIAGNOSIS — M47816 Spondylosis without myelopathy or radiculopathy, lumbar region: Secondary | ICD-10-CM

## 2021-12-19 DIAGNOSIS — J189 Pneumonia, unspecified organism: Secondary | ICD-10-CM

## 2021-12-19 DIAGNOSIS — E519 Thiamine deficiency, unspecified: Secondary | ICD-10-CM

## 2021-12-19 NOTE — Progress Notes (Unsigned)
   Subjective:   Patient ID: Jose [name], male    DOB: 1935/12/25, 86 y.o.   MRN: [id number]  HPI The patient is an 86 YO man coming in for multiple recent hospital admissions (in for stroke and then in for possible sepsis and possible CAP). He is doing okay. Still feeling weak. Feels he did not have any problems. Neighbor with him today helps to provide history. Without alcohol about 4-6 weeks at this time.   PMH, [place], social history reviewed and updated  Review of Systems  Constitutional:  Positive for activity change, appetite change, fatigue and unexpected weight change.  HENT: Negative.    Eyes: Negative.   Respiratory:  Negative for cough, chest tightness and shortness of breath.   Cardiovascular:  Negative for chest pain, palpitations and leg swelling.  Gastrointestinal:  Negative for abdominal distention, abdominal pain, constipation, diarrhea, nausea and vomiting.  Musculoskeletal:  Positive for myalgias.  Skin: Negative.   Neurological:  Positive for weakness.  Psychiatric/Behavioral:  Positive for decreased concentration and dysphoric mood.     Objective:  Physical Exam Constitutional:      Appearance: He is well-developed. He is ill-appearing.  HENT:     Head: Normocephalic and atraumatic.  Cardiovascular:     Rate and Rhythm: Normal rate and regular rhythm.  Pulmonary:     Effort: Pulmonary effort is normal. No respiratory distress.     Breath sounds: Normal breath sounds. No wheezing or rales.  Abdominal:     General: Bowel sounds are normal. There is no distension.     Palpations: Abdomen is soft.     Tenderness: There is no abdominal tenderness. There is no rebound.  Musculoskeletal:     Cervical back: Normal range of motion.  Skin:    General: Skin is warm and dry.  Neurological:     Mental Status: He is alert and oriented to person, place, and time.     Coordination: Coordination abnormal.     Comments: [place]     Vitals:   12/19/21 1520   BP: 100/68  Pulse: (!) 41  Temp: 98.6 F (37 C)  TempSrc: Oral  SpO2: 94%  Weight: 147 lb (66.7 kg)  Height: '5\' 10"'$  (1.778 m)    Assessment & Plan:  Visit time 35 minutes in face to face communication with patient and coordination of care, additional 15 minutes spent in record review, coordination or care, ordering tests, communicating/referring to other healthcare professionals, documenting in medical records all on the same day of the visit for total time 50 minutes spent on the visit.

## 2021-12-19 NOTE — Patient Instructions (Signed)
Make sure to do the nose spray calcitonin daily as this will help the back.

## 2021-12-21 NOTE — Assessment & Plan Note (Signed)
With subacute compression fracture on imaging from hospital. He is using calcitonin nasal spray and he is not sure if he is taking. Encouraged him to use this daily alternating nostrils for 1-2 months to help encourage healing.

## 2021-12-21 NOTE — Assessment & Plan Note (Signed)
Gait is weak but overall stable from prior. He is encouraged to stay away from alcohol and work with home health to build back some strength.

## 2021-12-21 NOTE — Assessment & Plan Note (Signed)
He was treated for this and is recovered. It is unclear if he clinically had this or not.

## 2021-12-21 NOTE — Assessment & Plan Note (Signed)
He is taking supplementation at this time.

## 2021-12-21 NOTE — Assessment & Plan Note (Signed)
Another recent CVA and he does not have clear residual symptoms although balance is impaired. He is taking lipitor 40 mg daily and aspirin 81 mg daily and encouraged to continue.

## 2021-12-21 NOTE — Assessment & Plan Note (Signed)
He is sober about 4-6 weeks. Continued encouragement of lifelong cessation. He does have changes associated with alcohol use on brain imaging which likely impacts his balance.

## 2021-12-28 ENCOUNTER — Telehealth: Payer: Self-pay | Admitting: Internal Medicine

## 2021-12-28 NOTE — Telephone Encounter (Signed)
Patients home health agency went to see him to day and patient has started back drinking - this was confimed by his wife.. His gait is more shuffled.  Home Health Agency:  [facility]  Name:  Jose [name]  Phone #  [phone]

## 2021-12-29 NOTE — Telephone Encounter (Signed)
Is this just fyi? Also he does not have a wife so likely that was his neighbor who confirmed.

## 2022-01-04 ENCOUNTER — Telehealth: Payer: Self-pay | Admitting: Internal Medicine

## 2022-01-04 ENCOUNTER — Other Ambulatory Visit: Payer: Self-pay

## 2022-01-04 MED ORDER — CITALOPRAM HYDROBROMIDE 20 MG PO TABS: 20.0000 mg | ORAL_TABLET | Freq: Every day | ORAL | 0 refills | Status: DC

## 2022-01-04 MED ORDER — ATORVASTATIN CALCIUM 40 MG PO TABS: 40.0000 mg | ORAL_TABLET | Freq: Every day | ORAL | 0 refills | Status: DC

## 2022-01-04 NOTE — Telephone Encounter (Signed)
..  Caller & Relationship to patient:  Nelda Bucks - friend   Call back number:  [phone]   Date of last office visit:12/21/2021   Date of next office visit:   Medication(s) to be refilled:  Lipitor and Celexa        Preferred Pharmacy: [facility] [address]

## 2022-02-08 ENCOUNTER — Telehealth: Payer: Self-pay | Admitting: Internal Medicine

## 2022-02-08 NOTE — Telephone Encounter (Signed)
Left message for patient to call back to schedule Medicare Annual Wellness Visit.  No hx of AWV eligible as of 09/24/19  Please schedule at anytime with [facility] if patient calls the office back.    30 minute appointment.  Any questions, please call me at [phone]

## 2022-02-17 ENCOUNTER — Encounter: Payer: Self-pay | Admitting: Internal Medicine

## 2022-02-17 ENCOUNTER — Ambulatory Visit (INDEPENDENT_AMBULATORY_CARE_PROVIDER_SITE_OTHER): Payer: Medicare Other | Admitting: Internal Medicine

## 2022-02-17 VITALS — BP 120/88 | HR 92 | Temp 97.8°F | Ht 70.0 in | Wt 148.2 lb

## 2022-02-17 DIAGNOSIS — G9332 Myalgic encephalomyelitis/chronic fatigue syndrome: Secondary | ICD-10-CM | POA: Diagnosis not present

## 2022-02-17 DIAGNOSIS — R270 Ataxia, unspecified: Secondary | ICD-10-CM

## 2022-02-17 DIAGNOSIS — F5101 Primary insomnia: Secondary | ICD-10-CM

## 2022-02-17 MED ORDER — METHYLPHENIDATE HCL 5 MG PO TABS: 5.0000 mg | ORAL_TABLET | Freq: Three times a day (TID) | ORAL | 0 refills | Status: DC | PRN

## 2022-02-17 MED ORDER — ALPRAZOLAM 0.5 MG PO TABS: 0.5000 mg | ORAL_TABLET | Freq: Three times a day (TID) | ORAL | 5 refills | Status: DC | PRN

## 2022-02-17 NOTE — Progress Notes (Signed)
   Subjective:   Patient ID: Jose [name], male    DOB: 05-Apr-1935, 87 y.o.   MRN: [id number]  HPI The patient is an 87 YO man coming in for fatigue. Also wants [place] to take over care of chronic fatigue and insomnia. DOB: 05-Apr-1935, 87 y.o.  Review of Systems  Constitutional:  Positive for activity change and appetite change.  HENT: Negative.    Eyes: Negative.   Respiratory:  Negative for cough, chest tightness and shortness of breath.   Cardiovascular:  Negative for chest pain, palpitations and leg swelling.  Gastrointestinal:  Negative for abdominal distention, abdominal pain, constipation, diarrhea, nausea and vomiting.  Musculoskeletal:  Positive for myalgias.  Skin: Negative.   Neurological:  Positive for weakness.  Psychiatric/Behavioral: Negative.      Objective:  Physical Exam Constitutional:      Appearance: He is well-developed. He is ill-appearing.     Comments: Chronically ill appearing  HENT:     Head: Normocephalic and atraumatic.  Cardiovascular:     Rate and Rhythm: Normal rate and regular rhythm.  Pulmonary:     Effort: Pulmonary effort is normal. No respiratory distress.     Breath sounds: Normal breath sounds. No wheezing or rales.  Abdominal:     General: Bowel sounds are normal. There is no distension.     Palpations: Abdomen is soft.     Tenderness: There is no abdominal tenderness. There is no rebound.  Musculoskeletal:     Cervical back: Normal range of motion.  Skin:    General: Skin is warm and dry.  Neurological:     Mental Status: He is alert and oriented to person, place, and time.     Coordination: Coordination normal.     Vitals:   02/17/22 1547  BP: 120/88  Pulse: 92  Temp: 97.8 F (36.6 C)  TempSrc: Oral  SpO2: 98%  Weight: 148 lb 3.2 oz (67.2 kg)  Height: '5\' 10"'$  (1.778 m)    Assessment & Plan:  Visit time 25 minutes in face to face communication with patient and coordination of care, additional 10 minutes spent in record review, coordination  or care, ordering tests, communicating/referring to other healthcare professionals, documenting in medical records all on the same day of the visit for total time 35 minutes spent on the visit.

## 2022-02-17 NOTE — Assessment & Plan Note (Signed)
Using alprazolam 1.5 mg qhs to sleep and rx done. Checked database and appropriate. Needs follow up in 3 months.

## 2022-02-17 NOTE — Assessment & Plan Note (Signed)
Asked patient how he takes methylphenidate 5-10 mg TID and max 6 per day on average day. Counseled that I will not prescribe more than he is taking and we would expect him to not take more than prescribed. Rx methylphenidate 5-10 mg TID #180 no refills.

## 2022-02-17 NOTE — Patient Instructions (Addendum)
I do recommend to stay off alcohol lifelong.   We have sent in the methylphenidate and the alprazolam

## 2022-02-17 NOTE — Assessment & Plan Note (Addendum)
Suspect alcohol overuse has contributed over his lifetime and counseled that he should follow up with hematology and neurology and stay off alcohol as this will worsen his problems. I did clarify that this will not likely improve his balance as damage is permanent and non-reversible from alcohol damage.

## 2022-03-21 ENCOUNTER — Telehealth: Payer: Self-pay | Admitting: Internal Medicine

## 2022-03-21 NOTE — Telephone Encounter (Signed)
Caller & Relationship to patient: self   Call back [phone]   Date of last office visit:  02/17/2022   Date of next office visit:   Medication(s) to be refilled:  Alprazalam and methylphenidate        Preferred Pharmacy:  [facility] on [address]

## 2022-03-21 NOTE — Telephone Encounter (Signed)
Alprazolam needs to call pharmacy for refill. Per [place] database he has not filled my rx for methylphenidate and needs to contact pharmacy as well.

## 2022-03-22 NOTE — Telephone Encounter (Signed)
Called and LVM for patient to reach out to his pharmacy to have this refilled.

## 2022-04-19 ENCOUNTER — Telehealth: Payer: Self-pay | Admitting: Internal Medicine

## 2022-04-19 NOTE — Telephone Encounter (Signed)
Prescription Request  04/19/2022  LOV: 02/17/2022  What is the name of the medication or equipment? ALPRAZolam (XANAX) 0.5 MG tablet  methylphenidate (RITALIN) 5 MG tablet   Have you contacted your pharmacy to request a refill? No   Which pharmacy would you like this sent to?  [facility], [place] - [id number] [place] [place] [address] Phone: [phone] Fax: [phone]   Patient notified that their request is being sent to the clinical staff for review and that they should receive a response within 2 business days.   Please advise at [facility] [phone]

## 2022-04-20 MED ORDER — METHYLPHENIDATE HCL 5 MG PO TABS: 5.0000 mg | ORAL_TABLET | Freq: Three times a day (TID) | ORAL | 0 refills | Status: DC | PRN

## 2022-04-20 NOTE — Telephone Encounter (Signed)
Methylphenidate sent in, alprazolam has refills at pharmacy contact them for refills

## 2022-04-20 NOTE — Telephone Encounter (Signed)
Patient called patient and informed him of provider response

## 2022-04-20 NOTE — Telephone Encounter (Signed)
Called patient and left message for patient to call office in reference to previous message

## 2022-05-17 ENCOUNTER — Telehealth: Payer: Self-pay | Admitting: Internal Medicine

## 2022-05-17 NOTE — Telephone Encounter (Signed)
Prescription Request  05/17/2022  LOV: 02/17/2022  What is the name of the medication or equipment? Methylphenidate  Have you contacted your pharmacy to request a refill? Yes   Which pharmacy would you like this sent to?  [facility] [address], [place] - [address] [place] [address] Phone: [phone] Fax: [phone]  Patient notified that their request is being sent to the clinical staff for review and that they should receive a response within 2 business days.   Please advise at Mobile There is no such number on file (mobile).

## 2022-05-19 ENCOUNTER — Telehealth: Payer: Self-pay | Admitting: Internal Medicine

## 2022-05-19 NOTE — Telephone Encounter (Signed)
Duplicative please see prior needs visit

## 2022-05-19 NOTE — Telephone Encounter (Signed)
Prescription Request  05/19/2022  LOV: 02/17/2022  What is the name of the medication or equipment? methylphenidate (RITALIN) 5 MG tablet [   Have you contacted your pharmacy to request a refill? No   Which pharmacy would you like this sent to?  Patient notified that their request is being sent to the clinical staff for review and that they should receive a response within 2 business days.    Please advise at Mobile There is no such number on file (mobile).  336--

## 2022-05-19 NOTE — Telephone Encounter (Signed)
He was told visit every 3 months so is due for visit for refill of this medication schedule thanks

## 2022-05-22 NOTE — Telephone Encounter (Signed)
LVM for pt to call back to make appt or medication refill

## 2022-05-23 NOTE — Telephone Encounter (Signed)
Pt has appt 05/26/22./lmb

## 2022-05-26 ENCOUNTER — Ambulatory Visit (INDEPENDENT_AMBULATORY_CARE_PROVIDER_SITE_OTHER): Payer: Medicare Other | Admitting: Internal Medicine

## 2022-05-26 ENCOUNTER — Encounter: Payer: Self-pay | Admitting: Internal Medicine

## 2022-05-26 VITALS — BP 120/70 | HR 91 | Temp 98.0°F | Ht 70.0 in | Wt 157.0 lb

## 2022-05-26 DIAGNOSIS — E782 Mixed hyperlipidemia: Secondary | ICD-10-CM

## 2022-05-26 DIAGNOSIS — F5101 Primary insomnia: Secondary | ICD-10-CM

## 2022-05-26 DIAGNOSIS — G9332 Myalgic encephalomyelitis/chronic fatigue syndrome: Secondary | ICD-10-CM

## 2022-05-26 DIAGNOSIS — F1010 Alcohol abuse, uncomplicated: Secondary | ICD-10-CM | POA: Diagnosis not present

## 2022-05-26 DIAGNOSIS — M5136 Other intervertebral disc degeneration, lumbar region: Secondary | ICD-10-CM

## 2022-05-26 LAB — COMPREHENSIVE METABOLIC PANEL
ALT: 14 U/L (ref 0–53)
AST: 17 U/L (ref 0–37)
Albumin: 4 g/dL (ref 3.5–5.2)
Alkaline Phosphatase: 68 U/L (ref 39–117)
BUN: 23 mg/dL (ref 6–23)
CO2: 33 mEq/L — ABNORMAL HIGH (ref 19–32)
Calcium: 9.9 mg/dL (ref 8.4–10.5)
Chloride: 101 mEq/L (ref 96–112)
Creatinine, Ser: 1 mg/dL (ref 0.40–1.50)
GFR: 68.84 mL/min (ref >60.00)
Glucose, Bld: 98 mg/dL (ref 70–99)
Potassium: 4.5 mEq/L (ref 3.5–5.1)
Sodium: 140 mEq/L (ref 135–145)
Total Bilirubin: 0.5 mg/dL (ref 0.2–1.2)
Total Protein: 6.8 g/dL (ref 6.0–8.3)

## 2022-05-26 LAB — CBC
HCT: 43.3 % (ref 39.0–52.0)
Hemoglobin: 14.3 g/dL (ref 13.0–17.0)
MCHC: 33.1 g/dL (ref 30.0–36.0)
MCV: 89.5 fl (ref 78.0–100.0)
Platelets: 199 10*3/uL (ref 150.0–400.0)
RBC: 4.84 Mil/uL (ref 4.22–5.81)
RDW: 15.4 % (ref 11.5–15.5)
WBC: 6.3 10*3/uL (ref 4.0–10.5)

## 2022-05-26 LAB — LIPID PANEL
Cholesterol: 187 mg/dL (ref 0–200)
HDL: 69.2 mg/dL (ref >39.00)
LDL Cholesterol: 86 mg/dL (ref 0–99)
NonHDL: 117.74
Total CHOL/HDL Ratio: 3
Triglycerides: 158 mg/dL — ABNORMAL HIGH (ref 0.0–149.0)
VLDL: 31.6 mg/dL (ref 0.0–40.0)

## 2022-05-26 MED ORDER — CELECOXIB 200 MG PO CAPS: 200.0000 mg | ORAL_CAPSULE | Freq: Every day | ORAL | 1 refills | Status: DC

## 2022-05-26 MED ORDER — METHYLPHENIDATE HCL 5 MG PO TABS: 5.0000 mg | ORAL_TABLET | Freq: Three times a day (TID) | ORAL | 0 refills | Status: DC | PRN

## 2022-05-26 NOTE — Patient Instructions (Addendum)
We have sent in the refill of the medicine methylphenidate and the celebrex for you.

## 2022-05-26 NOTE — Progress Notes (Signed)
   Subjective:   Patient ID: Jose [name], male    DOB: 1935-02-26, 87 y.o.   MRN: [id number]  Medication Refill Associated symptoms include weakness. Pertinent negatives include no abdominal pain, chest pain, coughing, nausea or vomiting.   The patient is an 87 YO man coming in for follow up. Is currently using alcohol he states a drink here and there. Would like increase in methylphenidate as he used to get 200 so he would have spare.   Review of Systems  Constitutional: Negative.   HENT: Negative.    Eyes: Negative.   Respiratory:  Negative for cough, chest tightness and shortness of breath.   Cardiovascular:  Negative for chest pain, palpitations and leg swelling.  Gastrointestinal:  Negative for abdominal distention, abdominal pain, constipation, diarrhea, nausea and vomiting.  Musculoskeletal: Negative.   Skin: Negative.   Neurological:  Positive for weakness.  Psychiatric/Behavioral: Negative.      Objective:  Physical Exam Constitutional:      Appearance: He is well-developed.  HENT:     Head: Normocephalic and atraumatic.  Cardiovascular:     Rate and Rhythm: Normal rate and regular rhythm.  Pulmonary:     Effort: Pulmonary effort is normal. No respiratory distress.     Breath sounds: Normal breath sounds. No wheezing or rales.  Abdominal:     General: Bowel sounds are normal. There is no distension.     Palpations: Abdomen is soft.     Tenderness: There is no abdominal tenderness. There is no rebound.  Musculoskeletal:     Cervical back: Normal range of motion.  Skin:    General: Skin is warm and dry.  Neurological:     Mental Status: He is alert and oriented to person, place, and time.     Coordination: Coordination normal.     Vitals:   05/26/22 1435  BP: 120/70  Pulse: 91  Temp: 98 F (36.7 C)  TempSrc: Oral  SpO2: 98%  Weight: 157 lb (71.2 kg)  Height: 5\' 10"  (1.778 m)    Assessment & Plan:

## 2022-05-26 NOTE — Assessment & Plan Note (Signed)
Taking alprazolam 1.5 mg qhs sleep and checking UDS today as prior was lost.

## 2022-05-26 NOTE — Assessment & Plan Note (Signed)
He admits to here and there drinking quantity unspecified. He is advised that given brain findings and balance findings I recommend medically to never drink alcohol again.

## 2022-05-26 NOTE — Assessment & Plan Note (Signed)
Needs rx for celebrex 200 mg daily which he uses rarely. Rx done today. Counseled about risk for stomach irritation.

## 2022-05-26 NOTE — Assessment & Plan Note (Signed)
Checking lipid panel today. Adjust as needed.

## 2022-05-26 NOTE — Assessment & Plan Note (Addendum)
Advised that his contract is for 180 per month methylphenidate as this is what he told me his was taking. It is not ethical to prescribe more than I know he uses so he can stockpile. Refilled for 180 methylphenidate. Reminded that our contract for controlled substances includes visit every 3 months so if he does not schedule this he may have delays in his prescription refills. Due for UDS today as prior was lost.

## 2022-06-22 ENCOUNTER — Telehealth: Payer: Self-pay

## 2022-06-22 NOTE — Telephone Encounter (Signed)
Pt has called requesting a rx refill for methylphenidate (RITALIN) 5 MG tablet

## 2022-06-23 MED ORDER — METHYLPHENIDATE HCL 5 MG PO TABS: 5.0000 mg | ORAL_TABLET | Freq: Three times a day (TID) | ORAL | 0 refills | Status: DC | PRN

## 2022-06-23 NOTE — Addendum Note (Signed)
Addended by: Hillard Danker A on: 06/23/2022 04:22 PM   Modules accepted: Orders

## 2022-06-23 NOTE — Telephone Encounter (Signed)
Sent in

## 2022-07-17 ENCOUNTER — Encounter: Payer: Self-pay | Admitting: Internal Medicine

## 2022-07-17 ENCOUNTER — Ambulatory Visit (INDEPENDENT_AMBULATORY_CARE_PROVIDER_SITE_OTHER): Payer: Medicare Other | Admitting: Internal Medicine

## 2022-07-17 VITALS — BP 112/80 | HR 78 | Temp 98.0°F | Ht 70.0 in | Wt 152.0 lb

## 2022-07-17 DIAGNOSIS — G9332 Myalgic encephalomyelitis/chronic fatigue syndrome: Secondary | ICD-10-CM

## 2022-07-17 DIAGNOSIS — R531 Weakness: Secondary | ICD-10-CM

## 2022-07-17 DIAGNOSIS — F1010 Alcohol abuse, uncomplicated: Secondary | ICD-10-CM

## 2022-07-17 DIAGNOSIS — E519 Thiamine deficiency, unspecified: Secondary | ICD-10-CM | POA: Diagnosis not present

## 2022-07-17 MED ORDER — CITALOPRAM HYDROBROMIDE 20 MG PO TABS: 20.0000 mg | ORAL_TABLET | Freq: Every day | ORAL | 0 refills | Status: DC

## 2022-07-17 MED ORDER — METHYLPHENIDATE HCL 5 MG PO TABS: 5.0000 mg | ORAL_TABLET | Freq: Three times a day (TID) | ORAL | 0 refills | Status: DC | PRN

## 2022-07-17 NOTE — Patient Instructions (Addendum)
You can fill the medicine methylphenidate on July 2nd.  We have sent in the celexa (citalopram) to start taking daily for mood. Come back in about 1 month so we can adjust the mood as needed.

## 2022-07-17 NOTE — Progress Notes (Unsigned)
   Subjective:   Patient ID: Jose [name], male    DOB: [birth year], 87 y.o.   MRN: [id number]  HPI The patient is an 87 YO man coming in for 2 weeks of feeling poorly. Overall not doing well. Was vomiting and abdominal pain. This is now resolved and mild constipation took medication and this helped. Some weakness which is improving gradually. He admits to no alcohol for months today. Last visit 1 month ago stated drinking "here and there". He is very concerned about his ritalin the pharmacy did not have enough for full fill. He was shorted about 20 pills and is out or almost out currently.   Review of Systems  Constitutional:  Positive for activity change, appetite change and fatigue.  HENT: Negative.    Eyes: Negative.   Respiratory:  Negative for cough, chest tightness and shortness of breath.   Cardiovascular:  Negative for chest pain, palpitations and leg swelling.  Gastrointestinal:  Positive for abdominal pain and vomiting. Negative for abdominal distention, constipation, diarrhea and nausea.  Musculoskeletal: Negative.   Skin: Negative.   Neurological:  Positive for weakness.  Psychiatric/Behavioral: Negative.      Objective:  Physical Exam Constitutional:      Appearance: He is well-developed.     Comments: Chronically ill appearing  HENT:     Head: Normocephalic and atraumatic.  Cardiovascular:     Rate and Rhythm: Normal rate and regular rhythm.  Pulmonary:     Effort: Pulmonary effort is normal. No respiratory distress.     Breath sounds: Normal breath sounds. No wheezing or rales.  Abdominal:     General: Bowel sounds are normal. There is no distension.     Palpations: Abdomen is soft.     Tenderness: There is no abdominal tenderness. There is no rebound.  Musculoskeletal:     Cervical back: Normal range of motion.  Skin:    General: Skin is warm and dry.  Neurological:     Mental Status: He is alert and oriented to person, place, and time.      Coordination: Coordination normal.     Vitals:   07/17/22 1450  BP: 112/80  Pulse: 78  Temp: 98 F (36.7 C)  TempSrc: Oral  SpO2: 97%  Weight: 152 lb (68.9 kg)  Height: 5\' 10"  (1.778 m)    Assessment & Plan:

## 2022-07-18 NOTE — Assessment & Plan Note (Signed)
Overall stable and he is using ritalin 10 mg 6 pills daily. He is upset about the pharmacy not having enough to fill and wanting more pills to make up this difference. I explained multiple times during visit that he will just fill 4 days early next fill to compensate. He then asks me to increase his number of pills to make up for this and I explained we do not change his sig based on this and I do not want him to increase number of pills. Refill done for ritalin #180 to be filled 07/25/22.

## 2022-07-18 NOTE — Assessment & Plan Note (Signed)
With memory changes and those are slightly worsening. It is unclear what living situation is like. Neighbor states he is not engaging in life as much and sleeps a lot. I suspect this may be related to prior or ongoing alcohol usage and prior stroke and thiamine deficiency wernicke type symptoms.

## 2022-07-18 NOTE — Assessment & Plan Note (Signed)
Unclear recent usage. With neighbor present today he states no alcohol for months. Previous visit alone he stated he was still drinking some. It is possible he is having some withdrawal recently as he has had similar symptoms with same in the past. Offered lab testing and he declines.

## 2022-07-18 NOTE — Assessment & Plan Note (Signed)
He had worsening recently and unclear etiology. He does not really want work up today. Unclear if this is related to alcohol usage. He has chronic balance issues likely related to alcohol use.

## 2022-07-25 ENCOUNTER — Telehealth: Payer: Self-pay | Admitting: Internal Medicine

## 2022-07-25 NOTE — Telephone Encounter (Signed)
Prescription has been called in 

## 2022-07-25 NOTE — Telephone Encounter (Signed)
Prescription Request  07/25/2022  LOV: 07/17/2022  What is the name of the medication or equipment? methylphenidate  Have you contacted your pharmacy to request a refill? Yes   Which pharmacy would you like this sent to?  [facility] [address], [place] - [address] [place] [address] Phone: [phone] Fax: [phone]     Patient notified that their request is being sent to the clinical staff for review and that they should receive a response within 2 business days.   Please advise at Mobile There is no such number on file (mobile).

## 2022-08-21 ENCOUNTER — Ambulatory Visit: Payer: Medicare Other | Admitting: Internal Medicine

## 2022-08-21 ENCOUNTER — Encounter: Payer: Self-pay | Admitting: Internal Medicine

## 2022-08-21 VITALS — BP 120/80 | HR 89 | Temp 98.1°F | Ht 70.0 in | Wt 148.0 lb

## 2022-08-21 DIAGNOSIS — F5101 Primary insomnia: Secondary | ICD-10-CM

## 2022-08-21 DIAGNOSIS — G9332 Myalgic encephalomyelitis/chronic fatigue syndrome: Secondary | ICD-10-CM

## 2022-08-21 MED ORDER — METHYLPHENIDATE HCL 5 MG PO TABS: 5.0000 mg | ORAL_TABLET | Freq: Three times a day (TID) | ORAL | 0 refills | Status: DC | PRN

## 2022-08-21 MED ORDER — ALPRAZOLAM 0.5 MG PO TABS: 0.5000 mg | ORAL_TABLET | Freq: Three times a day (TID) | ORAL | 5 refills | Status: DC | PRN

## 2022-08-21 NOTE — Progress Notes (Signed)
   Subjective:   Patient ID: Jose [name], male    DOB: [birth year], 87 y.o.   MRN: [id number]  HPI The patient is an 87 YO man coming in for f/u celexa did take for 19 days but made him more tired and falling asleep in the evening. Stopped taking it.   Review of Systems  Constitutional:  Positive for fatigue.  HENT: Negative.    Eyes: Negative.   Respiratory:  Negative for cough, chest tightness and shortness of breath.   Cardiovascular:  Negative for chest pain, palpitations and leg swelling.  Gastrointestinal:  Negative for abdominal distention, abdominal pain, constipation, diarrhea, nausea and vomiting.  Musculoskeletal: Negative.   Skin: Negative.   Neurological:  Positive for weakness.  Psychiatric/Behavioral: Negative.      Objective:  Physical Exam Constitutional:      Appearance: He is well-developed.  HENT:     Head: Normocephalic and atraumatic.  Cardiovascular:     Rate and Rhythm: Normal rate and regular rhythm.  Pulmonary:     Effort: Pulmonary effort is normal. No respiratory distress.     Breath sounds: Normal breath sounds. No wheezing or rales.  Abdominal:     General: Bowel sounds are normal. There is no distension.     Palpations: Abdomen is soft.     Tenderness: There is no abdominal tenderness. There is no rebound.  Musculoskeletal:     Cervical back: Normal range of motion.  Skin:    General: Skin is warm and dry.  Neurological:     Mental Status: He is alert and oriented to person, place, and time.     Coordination: Coordination normal.     Vitals:   08/21/22 1331  BP: 120/80  Pulse: 89  Temp: 98.1 F (36.7 C)  TempSrc: Oral  SpO2: 90%  Weight: 148 lb (67.1 kg)  Height: 5\' 10"  (1.778 m)    Assessment & Plan:

## 2022-08-21 NOTE — Patient Instructions (Signed)
We have sent in the refills today. ° ° ° ° °

## 2022-08-23 DIAGNOSIS — M179 Osteoarthritis of knee, unspecified: Secondary | ICD-10-CM | POA: Insufficient documentation

## 2022-08-23 DIAGNOSIS — M25511 Pain in right shoulder: Secondary | ICD-10-CM | POA: Insufficient documentation

## 2022-08-23 DIAGNOSIS — M25561 Pain in right knee: Secondary | ICD-10-CM | POA: Insufficient documentation

## 2022-08-24 ENCOUNTER — Telehealth: Payer: Self-pay | Admitting: Internal Medicine

## 2022-08-24 NOTE — Telephone Encounter (Signed)
We have received Surgical Clearance PW in the faxes and it has been placed in the provider's box.  Please fax to: [phone]

## 2022-08-25 NOTE — Assessment & Plan Note (Signed)
Uses alprazolam 0.5-1.5 mg at bedtime to sleep and refill not due today. Follow up 3-6 months. Continue at same dosing and he is satisfied with control currently.

## 2022-08-25 NOTE — Assessment & Plan Note (Signed)
Did try celexa 20 mg daily and did have side effects. He took for about 2-3 weeks and then stopped. He denies noticing any positive effects. He declines to try anything else. He is taking ritalin 10-20 mg TID and refilled at visit. Advised he should follow up in 3-6 months okay with a 6 month follow up as it is a struggle for him to get to office.

## 2022-08-28 ENCOUNTER — Ambulatory Visit: Payer: Medicare Other | Admitting: Internal Medicine

## 2022-08-30 NOTE — Telephone Encounter (Signed)
Placed inside Dr Sharlet Salina office box

## 2022-09-26 NOTE — H&P (Signed)
Patient's anticipated LOS is less than 2 midnights, meeting these requirements: - Younger than 48 - Lives within 1 hour of care - Has a competent adult at home to recover with post-op recover - NO history of  - Chronic pain requiring opiods  - Diabetes  - Coronary Artery Disease  - Heart failure  - Heart attack  - Stroke  - DVT/VTE  - Cardiac arrhythmia  - Respiratory Failure/COPD  - Renal failure  - Anemia  - Advanced Liver disease     Jose [name] is an 87 y.o. male.    Chief Complaint: right shoulder pain  HPI: Pt is a 87 y.o. male complaining of right shoulder pain for multiple years. Pain had continually increased since the beginning. X-rays in the clinic show end-stage arthritic changes of the right shoulder. Pt has tried various conservative treatments which have failed to alleviate their symptoms, including injections and therapy. Various options are discussed with the patient. Risks, benefits and expectations were discussed with the patient. Patient understand the risks, benefits and expectations and wishes to proceed with surgery.   PCP:  Myrlene Broker, MD  D/C Plans: Home  PMH: Past Medical History:  Diagnosis Date   Acute meniscal tear of knee    RIGHT KNEE   Arthritis    CAP (community acquired pneumonia) 12/08/2021   GERD (gastroesophageal reflux disease) 11/19/2020   Hives    PER PT UNKNOWN WHY   Hyperlipidemia 12/08/2021   IBS (irritable bowel syndrome)    Mild sleep apnea    PER STUDY  11-30-2010--  NO CPAP   Neuromuscular disorder (HCC)    Spinal stenosis     PSH: Past Surgical History:  Procedure Laterality Date   APPENDECTOMY     KNEE ARTHROSCOPY WITH LATERAL MENISECTOMY Right 04/12/2012   Procedure: RIGHT KNEE ARTHROSCOPY WITH PARTIAL MEDIAL AND LATERAL MENISECTOMY;  Surgeon: Drucilla Schmidt, MD;  Location: [facility];  Service: Orthopedics;  Laterality: Right;   SHOULDER ARTHROSCOPY WITH ROTATOR CUFF  REPAIR AND SUBACROMIAL DECOMPRESSION  12-21-1999  &  01-28-2004    Social History:  reports that he has quit smoking. His smoking use included cigarettes. He has never used smokeless tobacco. He reports current alcohol use. He reports that he does not use drugs. BMI: Estimated body mass index is 21.24 kg/m as calculated from the following:   Height as of 08/21/22: 5\' 10"  (1.778 m).   Weight as of 08/21/22: 67.1 kg.  Lab Results  Component Value Date   ALBUMIN 4.0 05/26/2022   Diabetes: Patient does not have a diagnosis of diabetes. Lab Results  Component Value Date   HGBA1C 5.3 11/15/2021     Smoking Status:      Allergies:  No Known Allergies  Medications: No current facility-administered medications for this encounter.   Current Outpatient Medications  Medication Sig Dispense Refill   ALPRAZolam (XANAX) 0.5 MG tablet Take 1 tablet (0.5 mg total) by mouth 3 (three) times daily as needed for anxiety. (Patient taking differently: Take 1-1.5 mg by mouth at bedtime.) 90 tablet 5   ascorbic acid (VITAMIN C) 500 MG tablet Take 1 tablet (500 mg total) by mouth daily. 30 tablet 0   bisacodyl (DULCOLAX) 5 MG EC tablet Take 1 tablet (5 mg total) by mouth daily as needed for moderate constipation. 30 tablet 0   caffeine 200 MG TABS tablet Take 200 mg by mouth daily as needed (Stay awake).     calcium carbonate (TUMS EX)  750 MG chewable tablet Chew 3 tablets by mouth at bedtime.     celecoxib (CELEBREX) 200 MG capsule Take 1 capsule (200 mg total) by mouth daily. 90 capsule 1   HYDROCORTISONE EX Apply 1 Application topically daily at 12 noon. 10     methylphenidate (RITALIN) 5 MG tablet Take 1-2 tablets (5-10 mg total) by mouth 3 (three) times daily as needed. (Patient taking differently: Take 5-10 mg by mouth See admin instructions. Take 10 mg in the morning and at middle of the day and 5 mg at 1800) 180 tablet 0   Multiple Vitamin (MULTIVITAMIN WITH MINERALS) TABS tablet Take 1 tablet by  mouth daily.     OVER THE COUNTER MEDICATION Take 2.5 mLs by mouth daily as needed (Stomach). Baking Soda     Oxymetazoline HCl (NASAL SPRAY NA) Place 1 spray into the nose every other day.      No results found for this or any previous visit (from the past 48 hour(s)). No results found.  ROS: Pain with rom of the right upper extremity  Physical Exam: Alert and oriented 87 y.o. male in no acute distress Cranial nerves 2-12 intact Cervical spine: full rom with no tenderness, nv intact distally Chest: active breath sounds bilaterally, no wheeze rhonchi or rales Heart: regular rate and rhythm, no murmur Abd: non tender non distended with active bowel sounds Hip is stable with rom  Right shoulder painful and weak rom Nv intact distally No rashes or edema   Assessment/Plan Assessment: right shoulder cuff arthropathy  Plan:  Patient will undergo a right reverse total shoulder by Dr. Ranell Patrick at [place] Risks benefits and expectations were discussed with the patient. Patient understand risks, benefits and expectations and wishes to proceed. Preoperative templating of the joint replacement has been completed, documented, and submitted to the Operating Room personnel in order to optimize intra-operative equipment management.   Alphonsa Overall PA-C, MPAS [facility] is now [employer] [address], [address], [place], [place] [address] Phone: [phone] [email or web address] Facebook  [employer]

## 2022-09-27 ENCOUNTER — Telehealth: Payer: Self-pay | Admitting: Internal Medicine

## 2022-09-27 NOTE — Telephone Encounter (Signed)
Please send in refill for patient

## 2022-09-27 NOTE — Telephone Encounter (Signed)
Prescription Request  09/27/2022  LOV: 08/21/2022  What is the name of the medication or equipment? methylphenidate  Have you contacted your pharmacy to request a refill? Yes   Which pharmacy would you like this sent to?  [facility] [address], [place] - [address] [place] [address] Phone: [phone] Fax: [phone]    Patient notified that their request is being sent to the clinical staff for review and that they should receive a response within 2 business days.   Please advise at Mobile There is no such number on file (mobile).

## 2022-09-28 MED ORDER — METHYLPHENIDATE HCL 5 MG PO TABS: 5.0000 mg | ORAL_TABLET | Freq: Three times a day (TID) | ORAL | 0 refills | Status: DC | PRN

## 2022-09-28 NOTE — Telephone Encounter (Signed)
Sent in refill

## 2022-10-03 NOTE — Progress Notes (Signed)
COVID Vaccine Completed:  Date of COVID positive in last 90 days:  PCP - Hillard Danker, MD Cardiologist -   Medical/cardiac Clearance by Dr. Okey Dupre 08/31/22 on chart Neurology clearance by Dr. Kelly Splinter 08/28/22 on chart  Chest x-ray - 12/08/21 Epic EKG - 12/08/21 Epic abnormal Stress Test -  ECHO - 11/15/21 Epic Cardiac Cath -  Pacemaker/ICD device last checked: Spinal Cord Stimulator:  Bowel Prep -   Sleep Study -  CPAP -   Fasting Blood Sugar -  Checks Blood Sugar _____ times a day  Last dose of GLP1 agonist-  N/A GLP1 instructions:  N/A   Last dose of SGLT-2 inhibitors-  N/A SGLT-2 instructions: N/A   Blood Thinner Instructions:  Time Aspirin Instructions: Last Dose:  Activity level:  Can go up a flight of stairs and perform activities of daily living without stopping and without symptoms of chest pain or shortness of breath.  Able to exercise without symptoms  Unable to go up a flight of stairs without symptoms of     Anesthesia review: cardiomegaly, abn EKG, CVA  Patient denies shortness of breath, fever, cough and chest pain at PAT appointment  Patient verbalized understanding of instructions that were given to them at the PAT appointment. Patient was also instructed that they will need to review over the PAT instructions again at home before surgery.

## 2022-10-04 ENCOUNTER — Encounter (HOSPITAL_COMMUNITY): Payer: Self-pay

## 2022-10-04 ENCOUNTER — Other Ambulatory Visit: Payer: Self-pay

## 2022-10-04 ENCOUNTER — Encounter (HOSPITAL_COMMUNITY)
Admission: RE | Admit: 2022-10-04 | Discharge: 2022-10-04 | Disposition: A | Discharge status: Home / Self Care | Payer: Medicare Other | Source: Ambulatory Visit | Attending: Orthopedic Surgery | Admitting: Orthopedic Surgery

## 2022-10-04 VITALS — BP 107/83 | HR 74 | Temp 98.1°F | Resp 16 | Ht 70.0 in | Wt 151.0 lb

## 2022-10-04 DIAGNOSIS — Z01818 Encounter for other preprocedural examination: Secondary | ICD-10-CM

## 2022-10-04 DIAGNOSIS — Z01812 Encounter for preprocedural laboratory examination: Secondary | ICD-10-CM | POA: Diagnosis present

## 2022-10-04 DIAGNOSIS — F1010 Alcohol abuse, uncomplicated: Secondary | ICD-10-CM | POA: Insufficient documentation

## 2022-10-04 DIAGNOSIS — Z8673 Personal history of transient ischemic attack (TIA), and cerebral infarction without residual deficits: Secondary | ICD-10-CM | POA: Insufficient documentation

## 2022-10-04 HISTORY — DX: Cerebral infarction, unspecified: I63.9

## 2022-10-04 HISTORY — DX: Malignant (primary) neoplasm, unspecified: C80.1

## 2022-10-04 LAB — COMPREHENSIVE METABOLIC PANEL
ALT: 17 U/L (ref 0–44)
AST: 24 U/L (ref 15–41)
Albumin: 4 g/dL (ref 3.5–5.0)
Alkaline Phosphatase: 55 U/L (ref 38–126)
Anion gap: 8 (ref 5–15)
BUN: 22 mg/dL (ref 8–23)
CO2: 29 mmol/L (ref 22–32)
Calcium: 9.7 mg/dL (ref 8.9–10.3)
Chloride: 103 mmol/L (ref 98–111)
Creatinine, Ser: 0.95 mg/dL (ref 0.61–1.24)
GFR, Estimated: >60 mL/min (ref >60)
Glucose, Bld: 102 mg/dL — ABNORMAL HIGH (ref 70–99)
Potassium: 4 mmol/L (ref 3.5–5.1)
Sodium: 140 mmol/L (ref 135–145)
Total Bilirubin: 0.4 mg/dL (ref 0.3–1.2)
Total Protein: 7.1 g/dL (ref 6.5–8.1)

## 2022-10-04 LAB — CBC
HCT: 40.6 % (ref 39.0–52.0)
Hemoglobin: 13 g/dL (ref 13.0–17.0)
MCH: 29 pg (ref 26.0–34.0)
MCHC: 32 g/dL (ref 30.0–36.0)
MCV: 90.6 fL (ref 80.0–100.0)
Platelets: 213 10*3/uL (ref 150–400)
RBC: 4.48 MIL/uL (ref 4.22–5.81)
RDW: 14.4 % (ref 11.5–15.5)
WBC: 5.3 10*3/uL (ref 4.0–10.5)
nRBC: 0 % (ref 0.0–0.2)

## 2022-10-04 LAB — SURGICAL PCR SCREEN
MRSA, PCR: NEGATIVE
Staphylococcus aureus: NEGATIVE

## 2022-10-04 NOTE — Patient Instructions (Addendum)
SURGICAL WAITING ROOM VISITATION  Patients having surgery or a procedure may have no more than 2 support people in the waiting area - these visitors may rotate.    Children under the age of 73 must have an adult with them who is not the patient.  Due to an increase in RSV and influenza rates and associated hospitalizations, children ages 79 and under may not visit patients in [facility] hospitals.  If the patient needs to stay at the hospital during part of their recovery, the visitor guidelines for inpatient rooms apply. Pre-op nurse will coordinate an appropriate time for 1 support person to accompany patient in pre-op.  This support person may not rotate.    Please refer to the [facility] website for the visitor guidelines for Inpatients (after your surgery is over and you are in a regular room).    Your procedure is scheduled on: 10/13/22   Report to [facility] Main Entrance    Report to admitting at 6:00 AM   Call this number if you have problems the morning of surgery [phone]   Do not eat food :After Midnight.   After Midnight you may have the following liquids until 5:30 AM DAY OF SURGERY  Water Non-Citrus Juices (without pulp, NO RED-Apple, White grape, White cranberry) Black Coffee (NO MILK/CREAM OR CREAMERS, sugar ok)  Clear Tea (NO MILK/CREAM OR CREAMERS, sugar ok) regular and decaf                             Plain Jell-O (NO RED)                                           Fruit ices (not with fruit pulp, NO RED)                                     Popsicles (NO RED)                                                               Sports drinks like Gatorade (NO RED)                 The day of surgery:  Drink ONE (1) Pre-Surgery Clear Ensure at 5:30 AM the morning of surgery. Drink in one sitting. Do not sip.  This drink was given to you during your hospital  pre-op appointment visit. Nothing else to drink after completing the  Pre-Surgery Clear  Ensure.          If you have questions, please contact your surgeon's office.   FOLLOW BOWEL PREP AND ANY ADDITIONAL PRE OP INSTRUCTIONS YOU RECEIVED FROM YOUR SURGEON'S OFFICE!!!     Oral Hygiene is also important to reduce your risk of infection.                                    Remember - BRUSH YOUR TEETH THE MORNING OF SURGERY WITH YOUR REGULAR TOOTHPASTE  DENTURES WILL  BE REMOVED PRIOR TO SURGERY PLEASE DO NOT APPLY "Poly grip" OR ADHESIVES!!!   Stop all vitamins and herbal supplements 7 days before surgery.   Take these medicines the morning of surgery with A SIP OF WATER: Nasal spray                              You may not have any metal on your body including jewelry, and body piercing             Do not wear lotions, powders, cologne, or deodorant              Men may shave face and neck.   Do not bring valuables to the hospital. [facility] IS NOT             RESPONSIBLE   FOR VALUABLES.   Contacts, glasses, dentures or bridgework may not be worn into surgery.   Bring small overnight bag day of surgery.   DO NOT BRING YOUR HOME MEDICATIONS TO THE HOSPITAL. PHARMACY WILL DISPENSE MEDICATIONS LISTED ON YOUR MEDICATION LIST TO YOU DURING YOUR ADMISSION IN THE HOSPITAL!              Please read over the following fact sheets you were given: IF YOU HAVE QUESTIONS ABOUT YOUR PRE-OP INSTRUCTIONS PLEASE CALL [name]    If you received a COVID test during your pre-op visit  it is requested that you wear a mask when out in public, stay away from anyone that may not be feeling well and notify your surgeon if you develop symptoms. If you test positive for Covid or have been in contact with anyone that has tested positive in the last 10 days please notify you surgeon.      Pre-operative 5 CHG Bath Instructions   You can play a key role in reducing the risk of infection after surgery. Your skin needs to be as free of germs as possible. You can reduce the number  of germs on your skin by washing with CHG (chlorhexidine gluconate) soap before surgery. CHG is an antiseptic soap that kills germs and continues to kill germs even after washing.   DO NOT use if you have an allergy to chlorhexidine/CHG or antibacterial soaps. If your skin becomes reddened or irritated, stop using the CHG and notify one of our RNs at [phone].   Please shower with the CHG soap starting 4 days before surgery using the following schedule:     Please keep in mind the following:  DO NOT shave, including legs and underarms, starting the day of your first shower.   You may shave your face at any point before/day of surgery.  Place clean sheets on your bed the day you start using CHG soap. Use a clean washcloth (not used since being washed) for each shower. DO NOT sleep with pets once you start using the CHG.   CHG Shower Instructions:  If you choose to wash your hair and private area, wash first with your normal shampoo/soap.  After you use shampoo/soap, rinse your hair and body thoroughly to remove shampoo/soap residue.  Turn the water OFF and apply about 3 tablespoons (45 ml) of CHG soap to a CLEAN washcloth.  Apply CHG soap ONLY FROM YOUR NECK DOWN TO YOUR TOES (washing for 3-5 minutes)  DO NOT use CHG soap on face, private areas, open wounds, or sores.  Pay special attention to the area  where your surgery is being performed.  If you are having back surgery, having someone wash your back for you may be helpful. Wait 2 minutes after CHG soap is applied, then you may rinse off the CHG soap.  Pat dry with a clean towel  Put on clean clothes/pajamas   If you choose to wear lotion, please use ONLY the CHG-compatible lotions on the back of this paper.     Additional instructions for the day of surgery: DO NOT APPLY any lotions, deodorants, cologne, or perfumes.   Put on clean/comfortable clothes.  Brush your teeth.  Ask your nurse before applying any prescription  medications to the skin.      CHG Compatible Lotions   Aveeno Moisturizing lotion  Cetaphil Moisturizing Cream  Cetaphil Moisturizing Lotion  Clairol Herbal Essence Moisturizing Lotion, Dry Skin  Clairol Herbal Essence Moisturizing Lotion, Extra Dry Skin  Clairol Herbal Essence Moisturizing Lotion, Normal Skin  Curel Age Defying Therapeutic Moisturizing Lotion with Alpha Hydroxy  Curel Extreme Care Body Lotion  Curel Soothing Hands Moisturizing Hand Lotion  Curel Therapeutic Moisturizing Cream, Fragrance-Free  Curel Therapeutic Moisturizing Lotion, Fragrance-Free  Curel Therapeutic Moisturizing Lotion, Original Formula  Eucerin Daily Replenishing Lotion  Eucerin Dry Skin Therapy Plus Alpha Hydroxy Crme  Eucerin Dry Skin Therapy Plus Alpha Hydroxy Lotion  Eucerin Original Crme  Eucerin Original Lotion  Eucerin Plus Crme Eucerin Plus Lotion  Eucerin TriLipid Replenishing Lotion  Keri Anti-Bacterial Hand Lotion  Keri Deep Conditioning Original Lotion Dry Skin Formula Softly Scented  Keri Deep Conditioning Original Lotion, Fragrance Free Sensitive Skin Formula  Keri Lotion Fast Absorbing Fragrance Free Sensitive Skin Formula  Keri Lotion Fast Absorbing Softly Scented Dry Skin Formula  Keri Original Lotion  Keri Skin Renewal Lotion Keri Silky Smooth Lotion  Keri Silky Smooth Sensitive Skin Lotion  Nivea Body Creamy Conditioning Oil  Nivea Body Extra Enriched Lotion  Nivea Body Original Lotion  Nivea Body Sheer Moisturizing Lotion Nivea Crme  Nivea Skin Firming Lotion  NutraDerm 30 Skin Lotion  NutraDerm Skin Lotion  NutraDerm Therapeutic Skin Cream  NutraDerm Therapeutic Skin Lotion  ProShield Protective Hand Cream  Provon moisturizing lotion   Incentive Spirometer  An incentive spirometer is a tool that can help keep your lungs clear and active. This tool measures how well you are filling your lungs with each breath. Taking long deep breaths may help reverse or  decrease the chance of developing breathing (pulmonary) problems (especially infection) following: A long period of time when you are unable to move or be active. BEFORE THE PROCEDURE  If the spirometer includes an indicator to show your best effort, your nurse or respiratory therapist will set it to a desired goal. If possible, sit up straight or lean slightly forward. Try not to slouch. Hold the incentive spirometer in an upright position. INSTRUCTIONS FOR USE  Sit on the edge of your bed if possible, or sit up as far as you can in bed or on a chair. Hold the incentive spirometer in an upright position. Breathe out normally. Place the mouthpiece in your mouth and seal your lips tightly around it. Breathe in slowly and as deeply as possible, raising the piston or the ball toward the top of the column. Hold your breath for 3-5 seconds or for as long as possible. Allow the piston or ball to fall to the bottom of the column. Remove the mouthpiece from your mouth and breathe out normally. Rest for a few seconds and repeat Steps 1  through 7 at least 10 times every 1-2 hours when you are awake. Take your time and take a few normal breaths between deep breaths. The spirometer may include an indicator to show your best effort. Use the indicator as a goal to work toward during each repetition. After each set of 10 deep breaths, practice coughing to be sure your lungs are clear. If you have an incision (the cut made at the time of surgery), support your incision when coughing by placing a pillow or rolled up towels firmly against it. Once you are able to get out of bed, walk around indoors and cough well. You may stop using the incentive spirometer when instructed by your caregiver.  RISKS AND COMPLICATIONS Take your time so you do not get dizzy or light-headed. If you are in pain, you may need to take or ask for pain medication before doing incentive spirometry. It is harder to take a deep breath if you  are having pain. AFTER USE Rest and breathe slowly and easily. It can be helpful to keep track of a log of your progress. Your caregiver can provide you with a simple table to help with this. If you are using the spirometer at home, follow these instructions: SEEK MEDICAL CARE IF:  You are having difficultly using the spirometer. You have trouble using the spirometer as often as instructed. Your pain medication is not giving enough relief while using the spirometer. You develop fever of 100.5 F (38.1 C) or higher. SEEK IMMEDIATE MEDICAL CARE IF:  You cough up bloody sputum that had not been present before. You develop fever of 102 F (38.9 C) or greater. You develop worsening pain at or near the incision site. MAKE SURE YOU:  Understand these instructions. Will watch your condition. Will get help right away if you are not doing well or get worse. Document Released: 05/22/2006 Document Revised: 04/03/2011 Document Reviewed: 07/23/2006 ExitCare Patient Information 2014 [place] [employer].   ________________________________________________________________________ [facility]- Preparing for Total Shoulder Arthroplasty    Before surgery, you can play an important role. Because skin is not sterile, your skin needs to be as free of germs as possible. You can reduce the number of germs on your skin by using the following products. Benzoyl Peroxide Gel Reduces the number of germs present on the skin Applied twice a day to shoulder area starting two days before surgery    ==================================================================  Please follow these instructions carefully:  BENZOYL PEROXIDE 5% GEL  Please do not use if you have an allergy to benzoyl peroxide.   If your skin becomes reddened/irritated stop using the benzoyl peroxide.  Starting two days before surgery, apply as follows: Apply benzoyl peroxide in the morning and at night. Apply after taking a shower. If you are not  taking a shower clean entire shoulder front, back, and side along with the armpit with a clean wet washcloth.  Place a quarter-sized dollop on your shoulder and rub in thoroughly, making sure to cover the front, back, and side of your shoulder, along with the armpit.   2 days before ____ AM   ____ PM              1 day before ____ AM   ____ PM                         Do this twice a day for two days.  (Last application is the night before surgery, AFTER using  the CHG soap as described below).  Do NOT apply benzoyl peroxide gel on the day of surgery.

## 2022-10-13 ENCOUNTER — Encounter (HOSPITAL_COMMUNITY): Payer: Self-pay | Admitting: Orthopedic Surgery

## 2022-10-13 ENCOUNTER — Ambulatory Visit (HOSPITAL_COMMUNITY): Payer: Medicare Other | Admitting: Physician Assistant

## 2022-10-13 ENCOUNTER — Inpatient Hospital Stay (HOSPITAL_COMMUNITY)
Admission: RE | Admit: 2022-10-13 | Discharge: 2022-10-15 | DRG: 483 | Disposition: A | Discharge status: Home / Self Care | Payer: Medicare Other | Attending: Orthopedic Surgery | Admitting: Orthopedic Surgery

## 2022-10-13 ENCOUNTER — Observation Stay (HOSPITAL_COMMUNITY): Payer: Medicare Other

## 2022-10-13 ENCOUNTER — Other Ambulatory Visit: Payer: Self-pay

## 2022-10-13 ENCOUNTER — Ambulatory Visit (HOSPITAL_COMMUNITY): Payer: Medicare Other | Admitting: Certified Registered"

## 2022-10-13 ENCOUNTER — Encounter (HOSPITAL_COMMUNITY)
Admission: RE | Disposition: A | Discharge status: Home / Self Care | Payer: Self-pay | Source: Home / Self Care | Attending: Orthopedic Surgery

## 2022-10-13 DIAGNOSIS — M19012 Primary osteoarthritis, left shoulder: Secondary | ICD-10-CM

## 2022-10-13 DIAGNOSIS — Z79899 Other long term (current) drug therapy: Secondary | ICD-10-CM

## 2022-10-13 DIAGNOSIS — Z87891 Personal history of nicotine dependence: Secondary | ICD-10-CM

## 2022-10-13 DIAGNOSIS — Z85828 Personal history of other malignant neoplasm of skin: Secondary | ICD-10-CM

## 2022-10-13 DIAGNOSIS — E785 Hyperlipidemia, unspecified: Secondary | ICD-10-CM | POA: Diagnosis not present

## 2022-10-13 DIAGNOSIS — K589 Irritable bowel syndrome without diarrhea: Secondary | ICD-10-CM | POA: Diagnosis present

## 2022-10-13 DIAGNOSIS — M19011 Primary osteoarthritis, right shoulder: Principal | ICD-10-CM | POA: Diagnosis present

## 2022-10-13 DIAGNOSIS — K219 Gastro-esophageal reflux disease without esophagitis: Secondary | ICD-10-CM | POA: Diagnosis present

## 2022-10-13 DIAGNOSIS — Z96611 Presence of right artificial shoulder joint: Principal | ICD-10-CM

## 2022-10-13 DIAGNOSIS — M75101 Unspecified rotator cuff tear or rupture of right shoulder, not specified as traumatic: Secondary | ICD-10-CM | POA: Diagnosis present

## 2022-10-13 HISTORY — PX: REVERSE SHOULDER ARTHROPLASTY: SHX5054

## 2022-10-13 SURGERY — ARTHROPLASTY, SHOULDER, TOTAL, REVERSE
Anesthesia: Regional | Site: Shoulder | Laterality: Right

## 2022-10-13 MED ORDER — FENTANYL CITRATE PF 50 MCG/ML IJ SOSY: 25.0000 ug | PREFILLED_SYRINGE | INTRAMUSCULAR | Status: DC | PRN

## 2022-10-13 MED ORDER — ORAL CARE MOUTH RINSE: 15.0000 mL | Freq: Once | OROMUCOSAL | Status: AC

## 2022-10-13 MED ORDER — CELECOXIB 200 MG PO CAPS: 200.0000 mg | ORAL_CAPSULE | Freq: Every day | ORAL | Status: DC

## 2022-10-13 MED ORDER — BISACODYL 5 MG PO TBEC: 5.0000 mg | DELAYED_RELEASE_TABLET | Freq: Every day | ORAL | Status: DC | PRN

## 2022-10-13 MED ORDER — PHENYLEPHRINE 80 MCG/ML (10ML) SYRINGE FOR IV PUSH (FOR BLOOD PRESSURE SUPPORT)
PREFILLED_SYRINGE | INTRAVENOUS | Status: DC | PRN
  Administered 2022-10-13: 80 ug via INTRAVENOUS

## 2022-10-13 MED ORDER — ACETAMINOPHEN 325 MG PO TABS: 325.0000 mg | ORAL_TABLET | Freq: Four times a day (QID) | ORAL | Status: DC | PRN

## 2022-10-13 MED ORDER — TRANEXAMIC ACID-NACL 1000-0.7 MG/100ML-% IV SOLN
1000.0000 mg | Freq: Once | INTRAVENOUS | Status: AC
  Administered 2022-10-13: 1000 mg via INTRAVENOUS
  Filled 2022-10-13: qty 100

## 2022-10-13 MED ORDER — CEFAZOLIN SODIUM-DEXTROSE 2-4 GM/100ML-% IV SOLN
2.0000 g | Freq: Four times a day (QID) | INTRAVENOUS | Status: AC
  Administered 2022-10-13 – 2022-10-14 (×3): 2 g via INTRAVENOUS
  Filled 2022-10-13 (×3): qty 100

## 2022-10-13 MED ORDER — METHOCARBAMOL 500 MG IVPB - SIMPLE MED: 500.0000 mg | Freq: Four times a day (QID) | INTRAVENOUS | Status: DC | PRN

## 2022-10-13 MED ORDER — DOCUSATE SODIUM 100 MG PO CAPS
100.0000 mg | ORAL_CAPSULE | Freq: Two times a day (BID) | ORAL | Status: DC
  Administered 2022-10-13 – 2022-10-15 (×4): 100 mg via ORAL
  Filled 2022-10-13 (×4): qty 1

## 2022-10-13 MED ORDER — 0.9 % SODIUM CHLORIDE (POUR BTL) OPTIME
TOPICAL | Status: DC | PRN
  Administered 2022-10-13: 1000 mL

## 2022-10-13 MED ORDER — CALCIUM CARBONATE ANTACID 500 MG PO CHEW
3.0000 | CHEWABLE_TABLET | Freq: Every day | ORAL | Status: DC
  Administered 2022-10-13 – 2022-10-14 (×2): 600 mg via ORAL
  Filled 2022-10-13 (×2): qty 3

## 2022-10-13 MED ORDER — TRANEXAMIC ACID-NACL 1000-0.7 MG/100ML-% IV SOLN
1000.0000 mg | INTRAVENOUS | Status: AC
  Administered 2022-10-13: 1000 mg via INTRAVENOUS
  Filled 2022-10-13: qty 100

## 2022-10-13 MED ORDER — METHOCARBAMOL 500 MG PO TABS
500.0000 mg | ORAL_TABLET | Freq: Four times a day (QID) | ORAL | Status: DC | PRN
  Administered 2022-10-14: 500 mg via ORAL
  Filled 2022-10-13: qty 1

## 2022-10-13 MED ORDER — HYDROCORTISONE 0.5 % EX CREA
TOPICAL_CREAM | Freq: Every day | CUTANEOUS | Status: DC
  Filled 2022-10-13: qty 28.35

## 2022-10-13 MED ORDER — PHENOL 1.4 % MT LIQD: 1.0000 | OROMUCOSAL | Status: DC | PRN

## 2022-10-13 MED ORDER — DROPERIDOL 2.5 MG/ML IJ SOLN: 0.6250 mg | Freq: Once | INTRAMUSCULAR | Status: DC | PRN

## 2022-10-13 MED ORDER — PROPOFOL 10 MG/ML IV BOLUS
INTRAVENOUS | Status: DC | PRN
  Administered 2022-10-13: 140 mg via INTRAVENOUS
  Administered 2022-10-13: 60 mg via INTRAVENOUS

## 2022-10-13 MED ORDER — CALCIUM CARBONATE ANTACID 750 MG PO CHEW: 3.0000 | CHEWABLE_TABLET | Freq: Every day | ORAL | Status: DC

## 2022-10-13 MED ORDER — BUPIVACAINE-EPINEPHRINE (PF) 0.25% -1:200000 IJ SOLN
INTRAMUSCULAR | Status: DC | PRN
  Administered 2022-10-13: 10 mL via PERINEURAL

## 2022-10-13 MED ORDER — ROCURONIUM BROMIDE 10 MG/ML (PF) SYRINGE
PREFILLED_SYRINGE | INTRAVENOUS | Status: AC
  Filled 2022-10-13: qty 10

## 2022-10-13 MED ORDER — MORPHINE SULFATE (PF) 2 MG/ML IV SOLN
0.5000 mg | INTRAVENOUS | Status: DC | PRN
  Administered 2022-10-14 (×2): 1 mg via INTRAVENOUS
  Filled 2022-10-13 (×2): qty 1

## 2022-10-13 MED ORDER — SUGAMMADEX SODIUM 200 MG/2ML IV SOLN
INTRAVENOUS | Status: DC | PRN
  Administered 2022-10-13: 140 mg via INTRAVENOUS

## 2022-10-13 MED ORDER — ONDANSETRON HCL 4 MG PO TABS: 4.0000 mg | ORAL_TABLET | Freq: Four times a day (QID) | ORAL | Status: DC | PRN

## 2022-10-13 MED ORDER — HYDROCODONE-ACETAMINOPHEN 5-325 MG PO TABS
1.0000 | ORAL_TABLET | ORAL | Status: DC | PRN
  Administered 2022-10-13: 1 via ORAL

## 2022-10-13 MED ORDER — MIDAZOLAM HCL 2 MG/2ML IJ SOLN: 2.0000 mg | Freq: Once | INTRAMUSCULAR | Status: DC

## 2022-10-13 MED ORDER — ROCURONIUM BROMIDE 10 MG/ML (PF) SYRINGE
PREFILLED_SYRINGE | INTRAVENOUS | Status: DC | PRN
  Administered 2022-10-13: 30 mg via INTRAVENOUS

## 2022-10-13 MED ORDER — DEXAMETHASONE SODIUM PHOSPHATE 10 MG/ML IJ SOLN
INTRAMUSCULAR | Status: DC | PRN
  Administered 2022-10-13: 4 mg via INTRAVENOUS

## 2022-10-13 MED ORDER — HYDROCODONE-ACETAMINOPHEN 5-325 MG PO TABS
ORAL_TABLET | ORAL | Status: AC
  Filled 2022-10-13: qty 1

## 2022-10-13 MED ORDER — VITAMIN C 500 MG PO TABS
500.0000 mg | ORAL_TABLET | Freq: Every day | ORAL | Status: DC
  Administered 2022-10-14 – 2022-10-15 (×2): 500 mg via ORAL
  Filled 2022-10-13 (×2): qty 1

## 2022-10-13 MED ORDER — ONDANSETRON HCL 4 MG/2ML IJ SOLN
INTRAMUSCULAR | Status: DC | PRN
  Administered 2022-10-13: 4 mg via INTRAVENOUS

## 2022-10-13 MED ORDER — PROPOFOL 10 MG/ML IV BOLUS
INTRAVENOUS | Status: AC
  Filled 2022-10-13: qty 20

## 2022-10-13 MED ORDER — NASAL SPRAY 0.05 % NA SOLN: NASAL | Status: DC

## 2022-10-13 MED ORDER — SODIUM CHLORIDE 0.9 % IV SOLN: INTRAVENOUS | Status: DC

## 2022-10-13 MED ORDER — DEXAMETHASONE SODIUM PHOSPHATE 10 MG/ML IJ SOLN
INTRAMUSCULAR | Status: AC
  Filled 2022-10-13: qty 1

## 2022-10-13 MED ORDER — FENTANYL CITRATE PF 50 MCG/ML IJ SOSY
100.0000 ug | PREFILLED_SYRINGE | Freq: Once | INTRAMUSCULAR | Status: AC
  Administered 2022-10-13: 100 ug via INTRAVENOUS
  Filled 2022-10-13: qty 2

## 2022-10-13 MED ORDER — HYDROCODONE-ACETAMINOPHEN 5-325 MG PO TABS: 1.0000 | ORAL_TABLET | Freq: Four times a day (QID) | ORAL | 0 refills | Status: DC | PRN

## 2022-10-13 MED ORDER — ADULT MULTIVITAMIN W/MINERALS CH
1.0000 | ORAL_TABLET | Freq: Every day | ORAL | Status: DC
  Administered 2022-10-14 – 2022-10-15 (×2): 1 via ORAL
  Filled 2022-10-13 (×2): qty 1

## 2022-10-13 MED ORDER — LACTATED RINGERS IV SOLN: INTRAVENOUS | Status: DC

## 2022-10-13 MED ORDER — ONDANSETRON HCL 4 MG/2ML IJ SOLN
4.0000 mg | Freq: Four times a day (QID) | INTRAMUSCULAR | Status: DC | PRN
  Administered 2022-10-14: 4 mg via INTRAVENOUS
  Filled 2022-10-13: qty 2

## 2022-10-13 MED ORDER — PHENYLEPHRINE HCL-NACL 20-0.9 MG/250ML-% IV SOLN
INTRAVENOUS | Status: DC | PRN
Start: 2022-10-13 — End: 2022-10-13
  Administered 2022-10-13: 20 ug/min via INTRAVENOUS

## 2022-10-13 MED ORDER — POLYETHYLENE GLYCOL 3350 17 G PO PACK
17.0000 g | PACK | Freq: Every day | ORAL | Status: DC | PRN
  Administered 2022-10-14: 17 g via ORAL
  Filled 2022-10-13: qty 1

## 2022-10-13 MED ORDER — BUPIVACAINE LIPOSOME 1.3 % IJ SUSP
INTRAMUSCULAR | Status: DC | PRN
Start: 2022-10-13 — End: 2022-10-13
  Administered 2022-10-13: 10 mL

## 2022-10-13 MED ORDER — CAFFEINE 200 MG PO TABS: 200.0000 mg | ORAL_TABLET | Freq: Every day | ORAL | Status: DC | PRN

## 2022-10-13 MED ORDER — ALPRAZOLAM 0.5 MG PO TABS
0.5000 mg | ORAL_TABLET | Freq: Three times a day (TID) | ORAL | Status: DC | PRN
  Administered 2022-10-13 – 2022-10-14 (×2): 0.5 mg via ORAL
  Filled 2022-10-13 (×2): qty 1

## 2022-10-13 MED ORDER — BUPIVACAINE-EPINEPHRINE 0.25% -1:200000 IJ SOLN
INTRAMUSCULAR | Status: AC
  Filled 2022-10-13: qty 1

## 2022-10-13 MED ORDER — METOCLOPRAMIDE HCL 5 MG/ML IJ SOLN: 5.0000 mg | Freq: Three times a day (TID) | INTRAMUSCULAR | Status: DC | PRN

## 2022-10-13 MED ORDER — ONDANSETRON HCL 4 MG/2ML IJ SOLN
INTRAMUSCULAR | Status: AC
  Filled 2022-10-13: qty 2

## 2022-10-13 MED ORDER — CHLORHEXIDINE GLUCONATE 0.12 % MT SOLN
15.0000 mL | Freq: Once | OROMUCOSAL | Status: AC
  Administered 2022-10-13: 15 mL via OROMUCOSAL

## 2022-10-13 MED ORDER — LIDOCAINE 2% (20 MG/ML) 5 ML SYRINGE
INTRAMUSCULAR | Status: DC | PRN
  Administered 2022-10-13: 40 mg via INTRAVENOUS

## 2022-10-13 MED ORDER — CEFAZOLIN SODIUM-DEXTROSE 2-4 GM/100ML-% IV SOLN
2.0000 g | INTRAVENOUS | Status: AC
  Administered 2022-10-13: 2 g via INTRAVENOUS
  Filled 2022-10-13: qty 100

## 2022-10-13 MED ORDER — OXYMETAZOLINE HCL 0.05 % NA SOLN
2.0000 | NASAL | Status: DC
  Filled 2022-10-13: qty 15

## 2022-10-13 MED ORDER — MENTHOL 3 MG MT LOZG: 1.0000 | LOZENGE | OROMUCOSAL | Status: DC | PRN

## 2022-10-13 MED ORDER — BUPIVACAINE HCL (PF) 0.5 % IJ SOLN
INTRAMUSCULAR | Status: DC | PRN
Start: 2022-10-13 — End: 2022-10-13
  Administered 2022-10-13: 10 mL

## 2022-10-13 MED ORDER — METHYLPHENIDATE HCL 5 MG PO TABS: 5.0000 mg | ORAL_TABLET | Freq: Three times a day (TID) | ORAL | Status: DC | PRN

## 2022-10-13 MED ORDER — BISACODYL 10 MG RE SUPP: 10.0000 mg | Freq: Every day | RECTAL | Status: DC | PRN

## 2022-10-13 MED ORDER — METOCLOPRAMIDE HCL 5 MG PO TABS: 5.0000 mg | ORAL_TABLET | Freq: Three times a day (TID) | ORAL | Status: DC | PRN

## 2022-10-13 MED ORDER — STERILE WATER FOR IRRIGATION IR SOLN
Status: DC | PRN
  Administered 2022-10-13: 2000 mL

## 2022-10-13 SURGICAL SUPPLY — 69 items
AID PSTN UNV HD RSTRNT DISP (MISCELLANEOUS) ×1
BAG COUNTER SPONGE SURGICOUNT (BAG) IMPLANT
BAG SPEC THK2 15X12 ZIP CLS (MISCELLANEOUS)
BAG SPNG CNTER NS LX DISP (BAG)
BAG ZIPLOCK 12X15 (MISCELLANEOUS) IMPLANT
BIT DRILL 1.6MX128 (BIT) IMPLANT
BIT DRILL 170X2.5X (BIT) IMPLANT
BIT DRL 170X2.5X (BIT) ×1
BLADE SAG 18X100X1.27 (BLADE) ×1 IMPLANT
COVER BACK TABLE 60X90IN (DRAPES) ×1 IMPLANT
COVER SURGICAL LIGHT HANDLE (MISCELLANEOUS) ×1 IMPLANT
CUP HUMERAL 42 PLUS 3 (Orthopedic Implant) IMPLANT
DRAPE INCISE IOBAN 66X45 STRL (DRAPES) ×1 IMPLANT
DRAPE ORTHO SPLIT 77X108 STRL (DRAPES) ×2
DRAPE SHEET LG 3/4 BI-LAMINATE (DRAPES) ×1 IMPLANT
DRAPE SURG ORHT 6 SPLT 77X108 (DRAPES) ×2 IMPLANT
DRAPE TOP 10253 STERILE (DRAPES) ×1 IMPLANT
DRAPE U-SHAPE 47X51 STRL (DRAPES) ×1 IMPLANT
DRILL 2.5 (BIT) ×1
DRSG ADAPTIC 3X8 NADH LF (GAUZE/BANDAGES/DRESSINGS) ×1 IMPLANT
DURAPREP 26ML APPLICATOR (WOUND CARE) ×1 IMPLANT
ELECT BLADE TIP CTD 4 INCH (ELECTRODE) ×1 IMPLANT
ELECT NDL TIP 2.8 STRL (NEEDLE) ×1 IMPLANT
ELECT NEEDLE TIP 2.8 STRL (NEEDLE) ×1 IMPLANT
ELECT REM PT RETURN 15FT ADLT (MISCELLANEOUS) ×1 IMPLANT
EPIPHYSI RIGHT SZ 2 (Shoulder) ×1 IMPLANT
EPIPHYSIS RIGHT SZ 2 (Shoulder) IMPLANT
FACESHIELD WRAPAROUND (MASK) ×1 IMPLANT
FACESHIELD WRAPAROUND OR TEAM (MASK) ×1 IMPLANT
GAUZE PAD ABD 8X10 STRL (GAUZE/BANDAGES/DRESSINGS) ×1 IMPLANT
GAUZE SPONGE 4X4 12PLY STRL (GAUZE/BANDAGES/DRESSINGS) ×1 IMPLANT
GLENOSPHERE LATRLZD 42 SHLDR (Screw) IMPLANT
GLOVE BIOGEL PI IND STRL 7.5 (GLOVE) ×1 IMPLANT
GLOVE BIOGEL PI IND STRL 8.5 (GLOVE) ×1 IMPLANT
GLOVE ORTHO TXT STRL SZ7.5 (GLOVE) ×1 IMPLANT
GLOVE SURG ORTHO 8.5 STRL (GLOVE) ×1 IMPLANT
GOWN STRL REUS W/ TWL XL LVL3 (GOWN DISPOSABLE) ×2 IMPLANT
GOWN STRL REUS W/TWL XL LVL3 (GOWN DISPOSABLE) ×2
KIT BASIN OR (CUSTOM PROCEDURE TRAY) ×1 IMPLANT
KIT TURNOVER KIT A (KITS) IMPLANT
MANIFOLD NEPTUNE II (INSTRUMENTS) ×1 IMPLANT
METAGLENE DELTA EXTEND (Trauma) IMPLANT
METAGLENE DXTEND (Trauma) ×1 IMPLANT
NDL MAYO CATGUT SZ4 TPR NDL (NEEDLE) IMPLANT
NEEDLE MAYO CATGUT SZ4 (NEEDLE) ×1 IMPLANT
NS IRRIG 1000ML POUR BTL (IV SOLUTION) ×1 IMPLANT
PACK SHOULDER (CUSTOM PROCEDURE TRAY) ×1 IMPLANT
PIN GUIDE 1.2 (PIN) IMPLANT
PIN GUIDE GLENOPHERE 1.5MX300M (PIN) IMPLANT
PIN METAGLENE 2.5 (PIN) IMPLANT
RESTRAINT HEAD UNIVERSAL NS (MISCELLANEOUS) ×1 IMPLANT
SCREW 4.5X18MM (Screw) ×1 IMPLANT
SCREW 4.5X36MM (Screw) IMPLANT
SCREW BN 18X4.5XSTRL SHLDR (Screw) IMPLANT
SCREW LOCK 42 (Screw) IMPLANT
SLING ARM FOAM STRAP LRG (SOFTGOODS) IMPLANT
SPIKE FLUID TRANSFER (MISCELLANEOUS) ×1 IMPLANT
SPONGE T-LAP 4X18 ~~LOC~~+RFID (SPONGE) IMPLANT
STEM 12 HA (Stem) IMPLANT
STRIP CLOSURE SKIN 1/2X4 (GAUZE/BANDAGES/DRESSINGS) ×1 IMPLANT
SUT FIBERWIRE #2 38 T-5 BLUE (SUTURE) ×3
SUT MNCRL AB 4-0 PS2 18 (SUTURE) ×1 IMPLANT
SUT VIC AB 0 CT1 36 (SUTURE) ×1 IMPLANT
SUT VIC AB 0 CT2 27 (SUTURE) ×1 IMPLANT
SUT VIC AB 2-0 CT1 27 (SUTURE) ×1
SUT VIC AB 2-0 CT1 TAPERPNT 27 (SUTURE) ×1 IMPLANT
SUTURE FIBERWR #2 38 T-5 BLUE (SUTURE) ×1 IMPLANT
TAPE CLOTH SURG 4X10 WHT LF (GAUZE/BANDAGES/DRESSINGS) IMPLANT
TOWEL OR 17X26 10 PK STRL BLUE (TOWEL DISPOSABLE) ×1 IMPLANT

## 2022-10-13 NOTE — Interval H&P Note (Signed)
History and Physical Interval Note:  10/13/2022 8:53 AM  Jose [name]  has presented today for surgery, with the diagnosis of Right shoulder osteoarthritis.  The various methods of treatment have been discussed with the patient and family. After consideration of risks, benefits and other options for treatment, the patient has consented to  Procedure(s) with comments: REVERSE SHOULDER ARTHROPLASTY (Right) - interscalene block flip room as a surgical intervention.  The patient's history has been reviewed, patient examined, no change in status, stable for surgery.  I have reviewed the patient's chart and labs.  Questions were answered to the patient's satisfaction.     Verlee Rossetti

## 2022-10-13 NOTE — Anesthesia Preprocedure Evaluation (Signed)
Anesthesia Evaluation  Patient identified by MRN, date of birth, ID band Patient awake    Reviewed: Allergy & Precautions, H&P , NPO status , Patient's Chart, lab work & pertinent test results  Airway Mallampati: II  TM Distance: >3 FB Neck ROM: Full    Dental no notable dental hx.    Pulmonary sleep apnea , former smoker   Pulmonary exam normal breath sounds clear to auscultation       Cardiovascular negative cardio ROS Normal cardiovascular exam Rhythm:Regular Rate:Normal     Neuro/Psych Ataxia Thoracic myelopathy  Neuromuscular disease CVA  negative psych ROS   GI/Hepatic ,GERD  ,,Alcohol abuse   Endo/Other  negative endocrine ROS    Renal/GU negative Renal ROS  negative genitourinary   Musculoskeletal  (+) Arthritis ,    Abdominal   Peds negative pediatric ROS (+)  Hematology negative hematology ROS (+)   Anesthesia Other Findings   Reproductive/Obstetrics negative OB ROS                              Anesthesia Physical Anesthesia Plan  ASA: 3  Anesthesia Plan: General and Regional   Post-op Pain Management:    Induction: Intravenous  PONV Risk Score and Plan: Ondansetron and Dexamethasone  Airway Management Planned: Oral ETT  Additional Equipment:   Intra-op Plan:   Post-operative Plan: Extubation in OR  Informed Consent: I have reviewed the patients History and Physical, chart, labs and discussed the procedure including the risks, benefits and alternatives for the proposed anesthesia with the patient or authorized representative who has indicated his/her understanding and acceptance.     Dental advisory given  Plan Discussed with: CRNA  Anesthesia Plan Comments:          Anesthesia Quick Evaluation

## 2022-10-13 NOTE — Op Note (Signed)
Jose [name], Jose [name] MEDICAL RECORD NO: [id number] ACCOUNT NO: [id number] DATE OF BIRTH: 08-18-1935 FACILITY: [facility] LOCATION: [facility] PHYSICIAN: Almedia Balls. Ranell Patrick, MD  Operative Report   DATE OF PROCEDURE: 10/13/2022  PREOPERATIVE DIAGNOSIS:  Right shoulder end-stage arthritis.  POSTOPERATIVE DIAGNOSIS: 1.  Right shoulder end-stage arthritis. 2.  Right shoulder rotator cuff tear, chronic.  PROCEDURE PERFORMED:  Right reverse shoulder replacement using DePuy Delta Xtend prosthesis with subscapularis repair.  ATTENDING SURGEON:  Almedia Balls. Ranell Patrick, MD  ASSISTANT:  Konrad Felix Dixon, [place], who was scrubbed during the entire procedure, and necessary for satisfactory completion of surgery.  ANESTHESIA:  General anesthesia was used plus interscalene block.  ESTIMATED BLOOD LOSS:  Less than 100 mL.  FLUID REPLACEMENT:  1500 mL crystalloid.  COUNTS:  Instrument counts correct.  COMPLICATIONS:  No complications.  ANTIBIOTICS:  Perioperative antibiotics were given.  INDICATIONS:  The patient is a 87 year old male with worsening right shoulder pain due to progressive arthritis and also rotator cuff tearing.  The patient presents for operative reverse total shoulder arthroplasty to eliminate pain and restore function.   Informed consent obtained.  DESCRIPTION OF PROCEDURE:  After an adequate level of anesthesia was achieved, the patient was positioned in modified beach chair position.  Right shoulder correctly identified and sterile prep and drape performed.  Timeout called, verifying correct  patient, correct site.  We used a standard deltopectoral incision starting at the coracoid process and extending down the anterior humerus with a 10 blade scalpel.  Dissection down through subcutaneous tissues using Bovie.  Cephalic vein was identified  and taken laterally.  Pectoralis was taken medially.  Conjoined tendon were identified and retracted.  We then placed our deep retractors.  I  then tenodesed the biceps in situ with 0 Vicryl figure of eight suture x2.  We then released the subscapularis  subperiosteally off the lesser tuberosity and tagged for repair with #2 FiberWire suture in a modified Mason-Allen suture technique.  We then released some of the remnant of the anterior supraspinatus.  The rest of the supraspinatus and the infraspinatus  were torn.  Teres minor was intact.  We extended the shoulder delivering the humeral head out of the wound.  The humerus was devoid of cartilage.  We entered the proximal humerus with a 6 mm reamer, reaming up to a size 12.  We then placed our 12 mm T  handle guide and resected the head at 20 degrees of retroversion with the oscillating saw.  Next, we removed osteophytes with a rongeur.  We then subluxed the humerus posteriorly.  Once we did that, we placed our deep retractors on the glenoid side.  We  removed both capsule and the labrum protecting the axillary nerve.  We then found our center point for a guide pin placed that low on the glenoid and then reamed to the subchondral bone for the metaglene baseplate.  Next, we did our peripheral hand  reaming with the T-handle reamer.  We then drilled out our central peg hole and then we impacted the HA coated press-fit baseplate into place.  We placed a 42 screw inferiorly, a 36 screw superiorly locking both the screws and then a 18 nonlocked  anteriorly.  Once we had those screws in place and the baseplate secured, we selected a 42+0 eccentric glenosphere and dialed the eccentricity inferiorly, we attached the glenosphere to the baseplate with a screwdriver.  Next, we did a finger sweep to  make sure we had no soft tissue caught  up between the baseplate and the glenosphere.  We then went back to the humeral side and reamed for the 2 right metaphysis.  We then trialed with a 12 stem and the 2 right metaphysis set on the 0 setting and placed  in 20 degrees of retroversion.  We then reduced the  shoulder with a 42+3 polyethylene trial on the humeral tray.  We were happy with our soft tissue balancing and stability.  We felt like the subscap was repairable.  We then removed the trial components,  irrigated thoroughly, placed drill holes in lesser tuberosity and #2 FiberWire suture to assist in repair.  We then used available bone graft and after irrigation, we used impaction grafting technique with the bone graft with the HA coated 12 stem and  the 2 right metaphysis impacted in 20 degrees of retroversion.  With that stem stable, we went ahead and selected the real 42+3 poly placed on the humeral tray, impacted that and reduced the shoulder.  We had appropriate soft tissue tensioning on the  conjoined.  No gapping with inferior pole or external rotation.  Next, we went ahead and irrigated thoroughly and then repaired the subscapularis anatomically back to bone and then did a deltopectoral interval repair with 0 Vicryl suture followed by 2-0  Vicryl for subcutaneous closure and 4-0 Monocryl for skin.  Steri-Strips applied followed by sterile dressing.  The patient tolerated surgery well.   PUS D: 10/13/2022 11:08:39 am T: 10/13/2022 11:27:00 am  JOB: [id number]/ [id number]

## 2022-10-13 NOTE — Discharge Instructions (Signed)
Ice to the shoulder constantly.  Keep the incision covered and clean and dry for one week, then ok to get it wet in the shower.  Do exercise as instructed several times per day.  DO NOT reach behind your back or push up out of a chair with the operative arm.  Use a sling while you are up and around for comfort, may remove while seated.  Keep pillow propped behind the operative elbow.  Follow up with Dr Ranell Patrick in two weeks in the office, call [phone] for appt  Please call Dr Ranell Patrick (cell) [phone] with any questions or concerns

## 2022-10-13 NOTE — Transfer of Care (Signed)
Immediate Anesthesia Transfer of Care Note  Patient: Jose [name]  Procedure(s) Performed: REVERSE SHOULDER ARTHROPLASTY (Right: Shoulder)  Patient Location: PACU  Anesthesia Type:General  Level of Consciousness: awake, alert , and patient cooperative  Airway & Oxygen Therapy: Patient Spontanous Breathing and Patient connected to face mask oxygen  Post-op Assessment: Report given to RN and Post -op Vital signs reviewed and stable  Post vital signs: Reviewed and stable  Last Vitals:  Vitals Value Taken Time  BP 152/90 10/13/22 1106  Temp    Pulse 96 10/13/22 1108  Resp 23 10/13/22 1108  SpO2 100 % 10/13/22 1108  Vitals shown include unfiled device data.  Last Pain:  Vitals:   10/13/22 0830  TempSrc:   PainSc: 0-No pain         Complications: No notable events documented.

## 2022-10-13 NOTE — Progress Notes (Signed)
   10/13/22 1530  TOC Brief Assessment  Insurance and Status Reviewed  Patient has primary care physician Yes  Home environment has been reviewed Lives alone  Prior level of function: Independent at baseline  Prior/Current Home Services No current home services  Social Determinants of Health Reivew SDOH reviewed no interventions necessary  Readmission risk has been reviewed Yes  Transition of care needs no transition of care needs at this time

## 2022-10-13 NOTE — Anesthesia Procedure Notes (Signed)
Procedure Name: Intubation Date/Time: 10/13/2022 9:12 AM  Performed by: Sindy Guadeloupe, CRNAPre-anesthesia Checklist: Patient identified, Emergency Drugs available, Suction available, Patient being monitored and Timeout performed Patient Re-evaluated:Patient Re-evaluated prior to induction Oxygen Delivery Method: Circle system utilized Preoxygenation: Pre-oxygenation with 100% oxygen Induction Type: IV induction Ventilation: Mask ventilation without difficulty Laryngoscope Size: Mac and 4 Grade View: Grade I Tube type: Oral Tube size: 7.5 mm Number of attempts: 1 Airway Equipment and Method: Stylet Placement Confirmation: ETT inserted through vocal cords under direct vision, positive ETCO2 and breath sounds checked- equal and bilateral Secured at: 24 cm Tube secured with: Tape Dental Injury: Teeth and Oropharynx as per pre-operative assessment

## 2022-10-13 NOTE — Anesthesia Procedure Notes (Signed)
Anesthesia Regional Block: Interscalene brachial plexus block   Pre-Anesthetic Checklist: , timeout performed,  Correct Patient, Correct Site, Correct Laterality,  Correct Procedure, Correct Position, site marked,  Risks and benefits discussed,  Surgical consent,  Pre-op evaluation,  At surgeon's request and post-op pain management  Laterality: Right  Prep: chloraprep       Needles:  Injection technique: Single-shot  Needle Type: Echogenic Stimulator Needle     Needle Length: 9cm  Needle Gauge: 21     Additional Needles:   Procedures:,,,, ultrasound used (permanent image in chart),,    Narrative:  Start time: 10/13/2022 8:12 AM End time: 10/13/2022 8:15 AM Injection made incrementally with aspirations every 5 mL.  Performed by: Personally  Anesthesiologist: Dunnigan Nation, MD  Additional Notes: Discussed risks and benefits of the nerve block in detail, including but not limited vascular injury, permanent nerve damage and infection.   Patient tolerated the procedure well. Local anesthetic introduced in an incremental fashion under minimal resistance after negative aspirations. No paresthesias were elicited. After completion of the procedure, no acute issues were identified and patient continued to be monitored by RN.

## 2022-10-13 NOTE — Progress Notes (Signed)
PHARMACIST - PHYSICIAN ORDER COMMUNICATION  CONCERNING: P&T Medication Policy on Herbal Medications  DESCRIPTION:  This patient's order for:  Caffeine  has been noted.  This product(s) is classified as an "herbal" or natural product. Due to a lack of definitive safety studies or FDA approval, nonstandard manufacturing practices, plus the potential risk of unknown drug-drug interactions while on inpatient medications, the Pharmacy and Therapeutics Committee does not permit the use of "herbal" or natural products of this type within [facility].   ACTION TAKEN: The pharmacy department is unable to verify this order at this time and your patient has been informed of this safety policy. Please reevaluate patient's clinical condition at discharge and address if the herbal or natural product(s) should be resumed at that time.   Bellamie Turney S. Merilynn Finland, PharmD, BCPS Clinical Staff Pharmacist [email or web address]

## 2022-10-13 NOTE — Anesthesia Postprocedure Evaluation (Signed)
Anesthesia Post Note  Patient: Jose [name]  Procedure(s) Performed: REVERSE SHOULDER ARTHROPLASTY (Right: Shoulder)     Patient location during evaluation: PACU Anesthesia Type: Regional and General Level of consciousness: awake and alert Pain management: pain level controlled Vital Signs Assessment: post-procedure vital signs reviewed and stable Respiratory status: spontaneous breathing, nonlabored ventilation, respiratory function stable and patient connected to nasal cannula oxygen Cardiovascular status: blood pressure returned to baseline and stable Postop Assessment: no apparent nausea or vomiting Anesthetic complications: no   No notable events documented.  Last Vitals:  Vitals:   10/13/22 1200 10/13/22 1300  BP: (!) 148/89 (!) 142/98  Pulse: 82 87  Resp: 12 17  Temp: 36.6 C   SpO2: 96% 95%    Last Pain:  Vitals:   10/13/22 1300  TempSrc:   PainSc: 6                   [place]

## 2022-10-13 NOTE — Brief Op Note (Signed)
10/13/2022  11:03 AM  PATIENT:  Quinn Plowman Saber  87 y.o. male  PRE-OPERATIVE DIAGNOSIS:  Right shoulder osteoarthritis, end stage  POST-OPERATIVE DIAGNOSIS:  Right shoulder osteoarthritis, end stage, rotator cuff tear  PROCEDURE:  Right Reverse Total Shoulder Replacement with subscap repair, DePuy Delta Xtend  SURGEON:  Surgeons and Role:    Beverely Low, MD - Primary  PHYSICIAN ASSISTANT:   ASSISTANTS: Thea Gist, PA-C   ANESTHESIA:   regional and general  EBL:  70 mL   BLOOD ADMINISTERED:none  DRAINS: none   LOCAL MEDICATIONS USED:  MARCAINE     SPECIMEN:  No Specimen  DISPOSITION OF SPECIMEN:  N/A  COUNTS:  YES  TOURNIQUET:  * No tourniquets in log *  DICTATION: .Other Dictation: Dictation Number [id number]  PLAN OF CARE: Admit for overnight observation  PATIENT DISPOSITION:  PACU - hemodynamically stable.   Delay start of Pharmacological VTE agent (>24hrs) due to surgical blood loss or risk of bleeding: not applicable

## 2022-10-14 DIAGNOSIS — K219 Gastro-esophageal reflux disease without esophagitis: Secondary | ICD-10-CM | POA: Diagnosis present

## 2022-10-14 DIAGNOSIS — E785 Hyperlipidemia, unspecified: Secondary | ICD-10-CM | POA: Diagnosis present

## 2022-10-14 DIAGNOSIS — Z79899 Other long term (current) drug therapy: Secondary | ICD-10-CM | POA: Diagnosis not present

## 2022-10-14 DIAGNOSIS — Z85828 Personal history of other malignant neoplasm of skin: Secondary | ICD-10-CM | POA: Diagnosis not present

## 2022-10-14 DIAGNOSIS — M19011 Primary osteoarthritis, right shoulder: Secondary | ICD-10-CM | POA: Diagnosis present

## 2022-10-14 DIAGNOSIS — Z87891 Personal history of nicotine dependence: Secondary | ICD-10-CM | POA: Diagnosis not present

## 2022-10-14 DIAGNOSIS — K589 Irritable bowel syndrome without diarrhea: Secondary | ICD-10-CM | POA: Diagnosis present

## 2022-10-14 DIAGNOSIS — M75101 Unspecified rotator cuff tear or rupture of right shoulder, not specified as traumatic: Secondary | ICD-10-CM | POA: Diagnosis present

## 2022-10-14 LAB — BASIC METABOLIC PANEL
Anion gap: 8 (ref 5–15)
BUN: 14 mg/dL (ref 8–23)
CO2: 27 mmol/L (ref 22–32)
Calcium: 9.3 mg/dL (ref 8.9–10.3)
Chloride: 100 mmol/L (ref 98–111)
Creatinine, Ser: 0.9 mg/dL (ref 0.61–1.24)
GFR, Estimated: >60 mL/min (ref >60)
Glucose, Bld: 143 mg/dL — ABNORMAL HIGH (ref 70–99)
Potassium: 3.8 mmol/L (ref 3.5–5.1)
Sodium: 135 mmol/L (ref 135–145)

## 2022-10-14 LAB — CBC
HCT: 42.8 % (ref 39.0–52.0)
Hemoglobin: 13.7 g/dL (ref 13.0–17.0)
MCH: 28.1 pg (ref 26.0–34.0)
MCHC: 32 g/dL (ref 30.0–36.0)
MCV: 87.9 fL (ref 80.0–100.0)
Platelets: 184 10*3/uL (ref 150–400)
RBC: 4.87 MIL/uL (ref 4.22–5.81)
RDW: 14.8 % (ref 11.5–15.5)
WBC: 14.4 10*3/uL — ABNORMAL HIGH (ref 4.0–10.5)
nRBC: 0 % (ref 0.0–0.2)

## 2022-10-14 MED ORDER — OXYCODONE HCL 5 MG PO TABS: 5.0000 mg | ORAL_TABLET | ORAL | Status: DC | PRN

## 2022-10-14 MED ORDER — KETOROLAC TROMETHAMINE 15 MG/ML IJ SOLN
15.0000 mg | Freq: Four times a day (QID) | INTRAMUSCULAR | Status: AC
  Administered 2022-10-14 – 2022-10-15 (×5): 15 mg via INTRAVENOUS
  Filled 2022-10-14 (×5): qty 1

## 2022-10-14 NOTE — Progress Notes (Signed)
   Subjective:  Jose [name] is a 87 y.o. male, 1 Day Post-Op   s/p Procedure(s): REVERSE SHOULDER ARTHROPLASTY   Patient reports pain as moderate to severe.  He notes severe right shoulder pain. A little disoriented. Denies n/t, denies fever or chills. Passing gas and urinating. Not ready to go home today.   Objective:   VITALS:   Vitals:   10/13/22 1642 10/13/22 2225 10/14/22 0158 10/14/22 0556  BP: 124/66 (!) 154/77 135/73 (!) 147/57  Pulse: 83 91 99 68  Resp: 16 18 18 16   Temp: 98.3 F (36.8 C) 98 F (36.7 C) 99.7 F (37.6 C) 99.2 F (37.3 C)  TempSrc:  Oral Oral Oral  SpO2: 95% 96% 92% 92%  Weight:      Height:       Constitutional: General Appearance: healthy-appearing, well-nourished, and well-developed. Level of Distress: no acute distress. Eyes: Lens (normal) clear: both eyes. Head: Head: normocephalic and atraumatic. Lungs: Respiratory effort: no dyspnea. Skin: Inspection and palpation: no rash, lesions Neurologic: Cranial Nerves: grossly intact. Sensation: grossly intact. Psychiatric: Insight: good judgement and insight. Mental Status: normal mood and affect and active and alert. Orientation: to time,person. Place when given context clues  RUE:  Neurovascular intact Sensation intact distally Intact pulses distally Dorsiflexion/Plantar flexion intact Incision: dressing C/D/I Compartment soft Aquacell clean, sling present able to move digits and elbow.   Lab Results  Component Value Date   WBC 5.3 10/04/2022   HGB 13.0 10/04/2022   HCT 40.6 10/04/2022   MCV 90.6 10/04/2022   PLT 213 10/04/2022   BMET    Component Value Date/Time   NA 140 10/04/2022 1451   K 4.0 10/04/2022 1451   CL 103 10/04/2022 1451   CO2 29 10/04/2022 1451   GLUCOSE 102 (H) 10/04/2022 1451   BUN 22 10/04/2022 1451   CREATININE 0.95 10/04/2022 1451   CALCIUM 9.7 10/04/2022 1451   GFRNONAA >60 10/04/2022 1451     Assessment/Plan: 1 Day Post-Op   Principal  Problem:   S/P shoulder replacement, right   Advance diet Up with therapy  Slightly disoriented, changed PRN medication to oxycodone and started toradol. Stopped celebrex due to toradol. Labs ordered.   Dispo: D/c once under better pain control and patient ready , hopefully tomorrow   Weightbearing Status: NWB RUE  in sling, ok to move elbow and digits.  DVT Prophylaxis: SCD ankle pumps   Arbie Cookey 10/14/2022, 7:51 AM  Dion Saucier PA-C  Physician Assistant with Dr. Rebekah [place]

## 2022-10-14 NOTE — Evaluation (Addendum)
Occupational Therapy Evaluation Patient Details Name: Jose [name] MRN: [id number] DOB: July 25, 1935 Today's Date: 10/14/2022   History of Present Illness Jose [name] or "Jose [name]" is a 87 y.o. male admitted for elective RT rTSA to his dominant UE under Dr. Ranell Patrick.  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   Clinical Impression   Pt is s/p reverse shoulder replacement without functional use of RT dominant upper extremity secondary to effects of surgery and interscalene block and shoulder precautions. Therapist provided education and instruction to patient and Sig. Other in regards to exercises, precautions, positioning, donning upper extremity clothing and bathing while maintaining shoulder precautions, ice and edema management and donning/doffing sling. Patient and S.O  verbalized understanding and demonstrated as needed, however pt would benefit from reinforcement. Patient did not work on getting dressed today due to IV, but was provided with instruction on compensatory strategies to perform ADLs per handout and OT demonstrating. Patient limited by decreased ROM in RT shoulder so therefore will need some form of assistance at home. Patient and S.O. verbalized and/or demonstrated understanding with help, and are agreeable to [facility] PT and OT until pt shifts to outpatient PT for shoulder rehab per Dr. Ranell Patrick. Patient to follow up with MD for further therapy needs.   During this evaluation, patient was limited by post-op pain, standing and sitting balance deficits and mild cognitive deficits with decreased safety awareness and decreased memory for precautions, all of which has the potential to impact patient's safety and independence during  functional mobility, as well as performance for ADLs.  Patient lives alone but his S.O, Dewayne Hatch will be able to provide 24/7 supervision and assistance as pt recovers.  Patient demonstrates good rehab potential, and should benefit from continued skilled occupational therapy services while in acute care to maximize safety, independence and quality of life at home.  Continued occupational therapy services at home are recommended.  ?        If plan is discharge home, recommend the following: A little help with walking and/or transfers;A little help with bathing/dressing/bathroom;Assist for transportation;Assistance with cooking/housework;Direct supervision/assist for medications management    Functional Status Assessment  Patient has had a recent decline in their functional status and demonstrates the ability to make significant improvements in function in a reasonable and predictable amount of time.  Equipment Recommendations  Other (comment) (long handled adaptive equipment with training)    Recommendations for Other Services PT consult     Precautions / Restrictions Precautions Precautions: Shoulder;Fall Shoulder Interventions: Shoulder sling/immobilizer;At all times;Off for dressing/bathing/exercises Precaution Booklet Issued: Yes (comment) Required Braces or Orthoses: Sling Restrictions Weight Bearing Restrictions: Yes RUE Weight Bearing: Non weight bearing Other Position/Activity Restrictions: Sling at all times except ADL/exercise Yes.  Non weight bearing Yes.  AROM elbow, wrist and hand to tolerance Yes.  PROM of shoulder No.  AROM of shoulder No      Mobility Bed Mobility Overal bed mobility: Needs Assistance Bed Mobility: Supine to Sit     Supine to sit: Mod assist     General bed mobility comments: Pt required heavy Mod Assist at trunk. Pt reported that he tried to sit EOB on his own earlier and was unable.  recommended to Pt and S.O. that a recliner (motoroized,  non-rocking) would be helpful at home.  Transfers Overall transfer level: Needs assistance Equipment used: 1 person hand held assist Transfers: Sit to/from Stand, Bed to chair/wheelchair/BSC Sit to Stand: Min assist           General transfer comment: Despite cues to get closer to recliner, pt sat too early with fall risk. Pt required Min As for safety and educated on proper sequencing before sitting to increase safety.      Balance Overall balance assessment: Needs assistance Sitting-balance support: Single extremity supported Sitting balance-Leahy Scale: Fair     Standing balance support: Single extremity supported Standing balance-Leahy Scale: Poor                             ADL either performed or assessed with clinical judgement   ADL Overall ADL's : Needs assistance/impaired Eating/Feeding: Set up;Bed level   Grooming: Minimal assistance;Sitting   Upper Body Bathing: Moderate assistance;Sitting;With caregiver independent assisting;Adhering to UE precautions   Lower Body Bathing: Minimal assistance;Moderate assistance;Sitting/lateral leans;Cueing for compensatory techniques;With caregiver independent assisting   Upper Body Dressing : Minimal assistance;Moderate assistance;Adhering to UE precautions;With caregiver independent assisting   Lower Body Dressing: Minimal assistance;Moderate assistance;With caregiver independent assisting   Toilet Transfer: Minimal assistance;Ambulation   Toileting- Clothing Manipulation and Hygiene: Minimal assistance;Sitting/lateral lean;Sit to/from stand       Functional mobility during ADLs: Minimal assistance;Cueing for sequencing;Cueing for safety       Vision Baseline Vision/History: 1 Wears glasses Patient Visual Report: No change from baseline       Perception         Praxis         Pertinent Vitals/Pain Pain Assessment Pain Assessment: No/denies pain Pain Intervention(s): Monitored during  session     Extremity/Trunk Assessment Upper Extremity Assessment Upper Extremity Assessment: Right hand dominant;RUE deficits/detail (LUE WFL) RUE Deficits / Details: Able to actively move hand, wrist and forearm. RUE: Unable to fully assess due to immobilization       Cervical / Trunk Assessment Cervical / Trunk Assessment: Kyphotic   Communication Communication Communication: No apparent difficulties   Cognition Arousal: Alert Behavior During Therapy: WFL for tasks assessed/performed Overall Cognitive Status: Within Functional Limits for tasks assessed Area of Impairment: Safety/judgement, Awareness, Memory                     Memory: Decreased short-term memory (Per pt's own report.)   Safety/Judgement: Decreased awareness of deficits Awareness: Emergent         General Comments       Exercises Other Exercises Other Exercises: Pt and S.O. educated on all hand/wrist/forearm/elbow AROm exercises with handout provided. Pt demonstrated understanding.   Shoulder Instructions Shoulder Instructions Donning/doffing shirt without moving shoulder: Moderate assistance Method for sponge bathing under operated UE: Moderate assistance;Caregiver independent with task Donning/doffing sling/immobilizer: Moderate assistance;Caregiver independent with task Correct positioning of sling/immobilizer: Supervision/safety;Minimal assistance;Caregiver independent with task ROM for elbow, wrist and digits of operated UE: Supervision/safety;Caregiver independent with task Sling wearing schedule (on at all times/off for ADL's): Supervision/safety;Caregiver independent with task Proper positioning of operated UE when showering: Min-guard;Caregiver independent with task Dressing change: Maximal assistance Positioning of UE while sleeping: Minimal assistance;Supervision/safety;Caregiver independent with task    Home Living Family/patient expects to be discharged to:: Private  residence Living Arrangements: Alone Available Help at Discharge: Family;Available 24 hours/day (Until pt safe to leave alone for periods of time) Type of Home: House Home Access: Level entry     Home  Layout: Two level;Able to live on main level with bedroom/bathroom Alternate Level Stairs-Number of Steps: 12 steps to access basement Alternate Level Stairs-Rails: Right;Left Bathroom Shower/Tub: Walk-in shower         Home Equipment: Adaptive equipment;Rolling Environmental consultant (2 wheels);Cane - single point Adaptive Equipment: Long-handled sponge        Prior Functioning/Environment Prior Level of Function : Independent/Modified Independent;Driving               ADLs Comments: Pt reports that he can complete his ADLs but with more difficult since gong to Rehab ~10 months ago.        OT Problem List: Decreased strength;Decreased safety awareness;Decreased range of motion;Impaired UE functional use;Decreased knowledge of use of DME or AE;Impaired balance (sitting and/or standing);Decreased knowledge of precautions;Pain      OT Treatment/Interventions: Self-care/ADL training;Therapeutic activities;Cognitive remediation/compensation;Therapeutic exercise;Patient/family education;Balance training;DME and/or AE instruction    OT Goals(Current goals can be found in the care plan section) Acute Rehab OT Goals Patient Stated Goal: "What can I get out of here?" OT Goal Formulation: With patient/family Time For Goal Achievement: 10/28/22 Potential to Achieve Goals: Good ADL Goals Pt Will Perform Grooming: with supervision;with set-up;standing Pt Will Transfer to Toilet: with supervision;ambulating (SPC vs LRAD) Pt Will Perform Toileting - Clothing Manipulation and hygiene: with adaptive equipment;with modified independence;sitting/lateral leans;sit to/from stand Pt/caregiver will Perform Home Exercise Program: Right Upper extremity;With written HEP provided;With Supervision Additional ADL  Goal #1: Pt will demonstrate UE/LE dressing, donning/doffing of sling, correct positioning of RT UE, and compensatory strategies for LT and RT axilla hygiene, as well as use of Ice, while correctly following all shoulder post-op precautions/restrictions.  OT Frequency: Min 1X/week    Co-evaluation              AM-PAC OT "6 Clicks" Daily Activity     Outcome Measure Help from another person eating meals?: A Little Help from another person taking care of personal grooming?: A Little Help from another person toileting, which includes using toliet, bedpan, or urinal?: A Little Help from another person bathing (including washing, rinsing, drying)?: A Lot Help from another person to put on and taking off regular upper body clothing?: A Lot Help from another person to put on and taking off regular lower body clothing?: A Lot 6 Click Score: 15   End of Session Equipment Utilized During Treatment: Gait belt Nurse Communication: Mobility status  Activity Tolerance: Patient tolerated treatment well Patient left: in chair;with call bell/phone within reach;with chair alarm set;with nursing/sitter in room;with family/visitor present  OT Visit Diagnosis: Unsteadiness on feet (R26.81);Pain;Muscle weakness (generalized) (M62.81);Other symptoms and signs involving cognitive function Pain - Right/Left: Right Pain - part of body: Shoulder;Arm                Time: 1255-1349 OT Time Calculation (min): 54 min Charges:  OT General Charges $OT Visit: 1 Visit OT Evaluation $OT Eval Low Complexity: 1 Low OT Treatments $Self Care/Home Management : 23-37 mins $Therapeutic Activity: 8-22 mins  Victorino Dike, OT Acute Rehab Services Office: [phone] 10/14/2022  Theodoro Clock 10/14/2022, 2:16 PM

## 2022-10-14 NOTE — TOC Progression Note (Signed)
Transition of Care [facility]) - Progression Note    Patient Details  Name: Jose [name] MRN: [id number] Date of Birth: 13-Feb-1935  [facility]) CM/SW Contact  Georgie Chard, LCSW Phone Number: 10/14/2022, 3:43 PM  Clinical Narrative:    CSW spoke to patient's wife about MOON. Wife stated that she understands and gave a verbal for the husband/Patient. At this time this CSW has put the paper work in the patients chart please add to DC papers when DC.      Barriers to Discharge: No Barriers Identified  Expected Discharge Plan and Services                                               Social Determinants of Health (SDOH) Interventions SDOH Screenings   Food Insecurity: No Food Insecurity (10/13/2022)  Housing: Low Risk  (10/13/2022)  Transportation Needs: No Transportation Needs (10/13/2022)  Utilities: Not At Risk (10/13/2022)  Depression (PHQ2-9): Low Risk  (08/21/2022)  Tobacco Use: Medium Risk (10/13/2022)    Readmission Risk Interventions     No data to display

## 2022-10-15 NOTE — Progress Notes (Signed)
   Subjective: 2 Days Post-Op Procedure(s) (LRB): REVERSE SHOULDER ARTHROPLASTY (Right) Patient reports pain as mild.   Patient seen in rounds for Dr. Ranell Patrick. Patient is doing well this morning. States he is feeling much better today and is ready to go home. No issues overnight.  Plan is to go Home after hospital stay.  Objective: Vital signs in last 24 hours: Temp:  [97.5 F (36.4 C)-98.3 F (36.8 C)] 97.5 F (36.4 C) (09/22 0620) Pulse Rate:  [84-104] 104 (09/22 0620) Resp:  [15-20] 20 (09/22 0620) BP: (114-118)/(70-78) 115/78 (09/22 0620) SpO2:  [90 %-95 %] 90 % (09/22 0620)  Intake/Output from previous day:  Intake/Output Summary (Last 24 hours) at 10/15/2022 1111 Last data filed at 10/14/2022 2159 Gross per 24 hour  Intake 570.83 ml  Output 450 ml  Net 120.83 ml    Intake/Output this shift: No intake/output data recorded.  Labs: Recent Labs    10/14/22 0939  HGB 13.7   Recent Labs    10/14/22 0939  WBC 14.4*  RBC 4.87  HCT 42.8  PLT 184   Recent Labs    10/14/22 0939  NA 135  K 3.8  CL 100  CO2 27  BUN 14  CREATININE 0.90  GLUCOSE 143*  CALCIUM 9.3   No results for input(s): "LABPT", "INR" in the last 72 hours.  Exam: General - Patient is Alert and Oriented Extremity - Neurologically intact Neurovascular intact Sensation intact distally Full range of motion right hand and wrist. Able to make fist. Dressing/Incision - clean, dry, no drainage   Past Medical History:  Diagnosis Date   Acute meniscal tear of knee    RIGHT KNEE   Arthritis    Cancer (HCC)    skin   CAP (community acquired pneumonia) 12/08/2021   GERD (gastroesophageal reflux disease) 11/19/2020   Hives    PER PT UNKNOWN WHY   Hyperlipidemia 12/08/2021   IBS (irritable bowel syndrome)    Mild sleep apnea    PER STUDY  11-30-2010--  NO CPAP   Neuromuscular disorder (HCC)    Spinal stenosis    Stroke (HCC)    unsure per pt    Assessment/Plan: 2 Days Post-Op  Procedure(s) (LRB): REVERSE SHOULDER ARTHROPLASTY (Right) Principal Problem:   S/P shoulder replacement, right  Estimated body mass index is 21.67 kg/m as calculated from the following:   Height as of this encounter: 5\' 10"  (1.778 m).   Weight as of this encounter: 68.5 kg.  Discharge to home once cleared with PT F/u with Dr. Ranell Patrick in 2 weeks  Arther Abbott, PA-C Orthopedic Surgery [phone] 10/15/2022, 11:11 AM

## 2022-10-15 NOTE — Plan of Care (Signed)
  Problem: Education: Goal: Knowledge of disease or condition will improve Outcome: Progressing Goal: Knowledge of secondary prevention will improve (MUST DOCUMENT ALL) Outcome: Progressing Goal: Knowledge of patient specific risk factors will improve Loraine Leriche N/A or DELETE if not current risk factor) Outcome: Progressing   Problem: Coping: Goal: Will verbalize positive feelings about self Outcome: Progressing Goal: Will identify appropriate support needs Outcome: Progressing   Problem: Pain Management: Goal: Pain level will decrease with appropriate interventions Outcome: Progressing   Problem: Activity: Goal: Risk for activity intolerance will decrease Outcome: Progressing   Problem: Skin Integrity: Goal: Risk for impaired skin integrity will decrease Outcome: Progressing

## 2022-10-15 NOTE — Evaluation (Signed)
Physical Therapy Evaluation Patient Details Name: Jose [name] MRN: [id number] DOB: 1935-10-11 Today's Date: 10/15/2022  History of Present Illness  87 yo male s/p R shoulder replacement 10/13/22. Hx of L3 compression fx, spinal stenosis, chronic fatigue syndrome, ETOH abuse, R CVA 2023, OA  Clinical Impression  On eval, pt required Mod A for safe mobility. He walked ~20 feet with use of a RW (pt holding on to walker with 1 hand since R UE is in sling). Pt denies shoulder pain but reports chronic neck pain during session. He is participatory, ready to mobilize, but has poor insight into current condition/mobility state. He is at high risk for falls when mobilizing. He has poor safety awareness.  He requires cues for adherence to shoulder precautions. He becomes easily agitated but can be redirected easily as well. At this time, PT recommendation is for ST SNF rehab. Per chart review and per pt preference, plan is for home. Pt has not demonstrated ability to safely d/c home on today. Will continue to follow and progress activity as safely able/pt tolerates.          If plan is discharge home, recommend the following: A lot of help with walking and/or transfers;Assistance with cooking/housework;Assist for transportation;Help with stairs or ramp for entrance;A lot of help with bathing/dressing/bathroom   Can travel by private vehicle        Equipment Recommendations None recommended by PT  Recommendations for Other Services  OT consult    Functional Status Assessment Patient has had a recent decline in their functional status and demonstrates the ability to make significant improvements in function in a reasonable and predictable amount of time.     Precautions / Restrictions Precautions Precautions: Fall;Shoulder Shoulder Interventions: Shoulder sling/immobilizer;At all times;Off for dressing/bathing/exercises Required Braces or Orthoses: Sling Restrictions Weight Bearing  Restrictions: Yes RUE Weight Bearing: Non weight bearing Other Position/Activity Restrictions: Sling at all times except ADL/exercise Yes.  Non weight bearing Yes.  AROM elbow, wrist and hand to tolerance Yes.  PROM of shoulder No.  AROM of shoulder No      Mobility  Bed Mobility Overal bed mobility: Needs Assistance Bed Mobility: Supine to Sit     Supine to sit: Mod assist     General bed mobility comments: Pt attempted to use bedrail. Increased time.Cues provided. Poor sitting balance with repeated LOB posteriorly requiring external assist and/or cues to correct.    Transfers Overall transfer level: Needs assistance Equipment used: Rolling walker (2 wheels) Transfers: Sit to/from Stand Sit to Stand: Min assist, From elevated surface           General transfer comment: Poor anterior weightshift with backs of bilateral LEs braced against bed for stabilization. When cued to step away from bed, repeated LOB posteriorly (x 2 sitting abruptly back onto bed). Cues for safety, technique, hand placement. Assist to rise, steady, control descent. Fall risk.    Ambulation/Gait Ambulation/Gait assistance: Min assist Gait Distance (Feet): 20 Feet Assistive device: Rolling walker (2 wheels) Gait Pattern/deviations: Step-through pattern, Decreased stride length, Trunk flexed       General Gait Details: Cues for safety, technqiue, RW proximity, posture, pacing/speed. Had pt hold on to one side of walker while therapist held onto/pushed opposite side. Pt really needs 2 hand support for stability/safety but is limited by sling on R UE. Assist to stabilize pt and mobilize RW for short distance ambulation. Pt with flexed knee and trunk posture intermittently Fall risk.  Stairs  Wheelchair Mobility     Tilt Bed    Modified Rankin (Stroke Patients Only)       Balance Overall balance assessment: Needs assistance, History of Falls   Sitting balance-Leahy Scale: Poor      Standing balance support: Bilateral upper extremity supported Standing balance-Leahy Scale: Poor                               Pertinent Vitals/Pain Pain Assessment Pain Assessment: Faces Faces Pain Scale: Hurts even more Pain Location: neck, denies R shoulder pain Pain Intervention(s): Limited activity within patient's tolerance, Monitored during session, Repositioned    Home Living Family/patient expects to be discharged to:: Private residence Living Arrangements: Alone Available Help at Discharge: Family;Available 24 hours/day Type of Home: House Home Access: Level entry     Alternate Level Stairs-Number of Steps: 12 steps to access basement Home Layout: Two level;Able to live on main level with bedroom/bathroom Home Equipment: Adaptive equipment;Rolling Walker (2 wheels);Gilmer Mor - single point      Prior Function Prior Level of Function : Independent/Modified Independent             Mobility Comments: after DC from CIR 11/28/21 was using a RW, no AD prior to CVA in October ADLs Comments: Pt reports that he can complete his ADLs but with more difficult since gong to Rehab ~10 months ago.     Extremity/Trunk Assessment   Upper Extremity Assessment Upper Extremity Assessment: Defer to OT evaluation    Lower Extremity Assessment Lower Extremity Assessment: Generalized weakness    Cervical / Trunk Assessment Cervical / Trunk Assessment: Kyphotic  Communication   Communication Communication: No apparent difficulties Cueing Techniques: Verbal cues  Cognition Arousal: Alert Behavior During Therapy: WFL for tasks assessed/performed Overall Cognitive Status: No family/caregiver present to determine baseline cognitive functioning Area of Impairment: Safety/judgement, Awareness, Memory, Problem solving                     Memory: Decreased short-term memory   Safety/Judgement: Decreased awareness of deficits   Problem Solving: Requires verbal  cues General Comments: Poor insight into current condition/mobility status. Only focused on going home.        General Comments      Exercises     Assessment/Plan    PT Assessment Patient needs continued PT services  PT Problem List Pain;Decreased activity tolerance;Decreased range of motion;Decreased balance;Decreased mobility;Decreased knowledge of use of DME       PT Treatment Interventions DME instruction;Gait training;Balance training;Functional mobility training;Therapeutic activities;Patient/family education    PT Goals (Current goals can be found in the Care Plan section)  Acute Rehab PT Goals Patient Stated Goal: home PT Goal Formulation: With patient Time For Goal Achievement: 10/29/22 Potential to Achieve Goals: Good    Frequency Min 1X/week     Co-evaluation               AM-PAC PT "6 Clicks" Mobility  Outcome Measure Help needed turning from your back to your side while in a flat bed without using bedrails?: A Lot Help needed moving from lying on your back to sitting on the side of a flat bed without using bedrails?: A Lot Help needed moving to and from a bed to a chair (including a wheelchair)?: A Lot Help needed standing up from a chair using your arms (e.g., wheelchair or bedside chair)?: A Lot Help needed to walk in hospital room?: A Lot  Help needed climbing 3-5 steps with a railing? : Total 6 Click Score: 11    End of Session Equipment Utilized During Treatment: Gait belt (R UE sling) Activity Tolerance: Patient tolerated treatment well;Patient limited by pain Patient left: in chair;with call bell/phone within reach;with chair alarm set   PT Visit Diagnosis: History of falling (Z91.81);Other abnormalities of gait and mobility (R26.89);Difficulty in walking, not elsewhere classified (R26.2) Pain - part of body:  (neck)    Time: 1003-1030 PT Time Calculation (min) (ACUTE ONLY): 27 min   Charges:   PT Evaluation $PT Eval Low Complexity: 1  Low PT Treatments $Gait Training: 8-22 mins PT General Charges $$ ACUTE PT VISIT: 1 Visit            Faye Ramsay, PT Acute Rehabilitation  Office: [phone]

## 2022-10-16 ENCOUNTER — Encounter (HOSPITAL_COMMUNITY): Payer: Self-pay | Admitting: Orthopedic Surgery

## 2022-10-24 NOTE — Discharge Summary (Signed)
Patient ID: Jose [name] MRN: [id number] DOB/AGE: June 24, 1935 87 y.o.  Admit date: 10/13/2022 Discharge date: 10/15/2022  Admission Diagnoses:  Principal Problem:   S/P shoulder replacement, right   Discharge Diagnoses:  Same  Past Medical History:  Diagnosis Date   Acute meniscal tear of knee    RIGHT KNEE   Arthritis    Cancer (HCC)    skin   CAP (community acquired pneumonia) 12/08/2021   GERD (gastroesophageal reflux disease) 11/19/2020   Hives    PER PT UNKNOWN WHY   Hyperlipidemia 12/08/2021   IBS (irritable bowel syndrome)    Mild sleep apnea    PER STUDY  11-30-2010--  NO CPAP   Neuromuscular disorder (HCC)    Spinal stenosis    Stroke [facility])    unsure per pt    Surgeries: Procedure(s): REVERSE SHOULDER ARTHROPLASTY on 10/13/2022   Consultants:   Discharged Condition: Improved  Hospital Course: Jose [name] is an 87 y.o. male who was admitted 10/13/2022 for operative treatment ofS/P shoulder replacement, right. Patient has severe unremitting pain that affects sleep, daily activities, and work/hobbies. After pre-op clearance the patient was taken to the operating room on 10/13/2022 and underwent  Procedure(s): REVERSE SHOULDER ARTHROPLASTY.    Patient was given perioperative antibiotics:  Anti-infectives (From admission, onward)    Start     Dose/Rate Route Frequency Ordered Stop   10/13/22 1515  ceFAZolin (ANCEF) IVPB 2g/100 mL premix        2 g 200 mL/hr over 30 Minutes Intravenous Every 6 hours 10/13/22 1433 10/14/22 0233   10/13/22 0645  ceFAZolin (ANCEF) IVPB 2g/100 mL premix        2 g 200 mL/hr over 30 Minutes Intravenous On call to O.R. 10/13/22 7371 10/13/22 0626        Patient was given sequential compression devices, early ambulation, and chemoprophylaxis to prevent DVT.  Patient benefited maximally from hospital stay and there were no complications.    Recent vital signs: No data found.   Recent laboratory studies: No  results for input(s): "WBC", "HGB", "HCT", "PLT", "NA", "K", "CL", "CO2", "BUN", "CREATININE", "GLUCOSE", "INR", "CALCIUM" in the last 72 hours.  Invalid input(s): "PT", "2"   Discharge Medications:   Allergies as of 10/15/2022   No Known Allergies      Medication List     TAKE these medications    ALPRAZolam 0.5 MG tablet Commonly known as: XANAX Take 1 tablet (0.5 mg total) by mouth 3 (three) times daily as needed for anxiety. What changed:  how much to take when to take this   ascorbic acid 500 MG tablet Commonly known as: VITAMIN C Take 1 tablet (500 mg total) by mouth daily.   bisacodyl 5 MG EC tablet Commonly known as: DULCOLAX Take 1 tablet (5 mg total) by mouth daily as needed for moderate constipation.   caffeine 200 MG Tabs tablet Take 200 mg by mouth daily as needed (Stay awake).   calcium carbonate 750 MG chewable tablet Commonly known as: TUMS EX Chew 3 tablets by mouth at bedtime.   celecoxib 200 MG capsule Commonly known as: CeleBREX Take 1 capsule (200 mg total) by mouth daily.   HYDROcodone-acetaminophen 5-325 MG tablet Commonly known as: Norco Take 1 tablet by mouth every 6 (six) hours as needed for moderate pain or severe pain.   HYDROCORTISONE EX Apply 1 Application topically daily at 12 noon. 10   methylphenidate 5 MG tablet Commonly known as: Ritalin Take 1-2 tablets (5-10  mg total) by mouth 3 (three) times daily as needed.   multivitamin with minerals Tabs tablet Take 1 tablet by mouth daily.   NASAL SPRAY NA Place 1 spray into the nose every other day.   OVER THE COUNTER MEDICATION Take 2.5 mLs by mouth daily as needed (Stomach). Baking Soda        Diagnostic Studies: DG Shoulder Right Port  Result Date: 10/13/2022 CLINICAL DATA:  Status post right shoulder replacement. EXAM: RIGHT SHOULDER - 1 VIEW COMPARISON:  None Available. FINDINGS: Reverse right shoulder arthroplasty in expected alignment. No periprosthetic lucency or  fracture. Recent postsurgical change includes air and edema in the joint space and soft tissues. IMPRESSION: Reverse right shoulder arthroplasty without immediate postoperative complication. Electronically Signed   By: Narda Rutherford M.D.   On: 10/13/2022 14:20    Disposition: Discharge disposition: 01-Home or Self Care          Follow-up Information     Beverely Low, MD. Call in 2 week(s).   Specialty: Orthopedic Surgery Why: call [phone] for follow up appt Contact information: [address] [place] [address] [place] [address] [phone]                  Signed: Arther Abbott 10/24/2022, 7:57 AM

## 2022-11-02 ENCOUNTER — Other Ambulatory Visit: Payer: Self-pay

## 2022-11-02 ENCOUNTER — Telehealth: Payer: Self-pay | Admitting: Internal Medicine

## 2022-11-02 NOTE — Telephone Encounter (Signed)
Next OV is 11/06/2022.   Prescription Request  11/02/2022  LOV: 08/21/2022  What is the name of the medication or equipment?  methylphenidate (RITALIN) 5 MG tablet  Have you contacted your pharmacy to request a refill? No   Which pharmacy would you like this sent to?  [facility] [address] [place] - [address] [place] [address] Phone: [phone] Fax: [phone]    Patient notified that their request is being sent to the clinical staff for review and that they should receive a response within 2 business days.   Please advise at [facility] [phone]

## 2022-11-06 ENCOUNTER — Encounter: Payer: Self-pay | Admitting: Internal Medicine

## 2022-11-06 ENCOUNTER — Ambulatory Visit: Payer: Medicare Other | Admitting: Internal Medicine

## 2022-11-06 VITALS — BP 118/62 | HR 96 | Temp 97.8°F | Ht 70.0 in | Wt 143.0 lb

## 2022-11-06 DIAGNOSIS — E519 Thiamine deficiency, unspecified: Secondary | ICD-10-CM | POA: Diagnosis not present

## 2022-11-06 DIAGNOSIS — F1010 Alcohol abuse, uncomplicated: Secondary | ICD-10-CM

## 2022-11-06 DIAGNOSIS — E559 Vitamin D deficiency, unspecified: Secondary | ICD-10-CM | POA: Diagnosis not present

## 2022-11-06 DIAGNOSIS — G9332 Myalgic encephalomyelitis/chronic fatigue syndrome: Secondary | ICD-10-CM

## 2022-11-06 DIAGNOSIS — E440 Moderate protein-calorie malnutrition: Secondary | ICD-10-CM

## 2022-11-06 DIAGNOSIS — R531 Weakness: Secondary | ICD-10-CM

## 2022-11-06 LAB — CBC
HCT: 37.4 % — ABNORMAL LOW (ref 39.0–52.0)
Hemoglobin: 11.9 g/dL — ABNORMAL LOW (ref 13.0–17.0)
MCHC: 31.9 g/dL (ref 30.0–36.0)
MCV: 85.3 fL (ref 78.0–100.0)
Platelets: 293 10*3/uL (ref 150.0–400.0)
RBC: 4.39 Mil/uL (ref 4.22–5.81)
RDW: 15.4 % (ref 11.5–15.5)
WBC: 5.2 10*3/uL (ref 4.0–10.5)

## 2022-11-06 LAB — COMPREHENSIVE METABOLIC PANEL
ALT: 11 U/L (ref 0–53)
AST: 17 U/L (ref 0–37)
Albumin: 4 g/dL (ref 3.5–5.2)
Alkaline Phosphatase: 79 U/L (ref 39–117)
BUN: 18 mg/dL (ref 6–23)
CO2: 28 meq/L (ref 19–32)
Calcium: 9.9 mg/dL (ref 8.4–10.5)
Chloride: 103 meq/L (ref 96–112)
Creatinine, Ser: 0.97 mg/dL (ref 0.40–1.50)
GFR: 71.17 mL/min (ref >60.00)
Glucose, Bld: 108 mg/dL — ABNORMAL HIGH (ref 70–99)
Potassium: 3.8 meq/L (ref 3.5–5.1)
Sodium: 139 meq/L (ref 135–145)
Total Bilirubin: 0.3 mg/dL (ref 0.2–1.2)
Total Protein: 7.2 g/dL (ref 6.0–8.3)

## 2022-11-06 LAB — VITAMIN B12: Vitamin B-12: 605 pg/mL (ref 211–911)

## 2022-11-06 LAB — VITAMIN D 25 HYDROXY (VIT D DEFICIENCY, FRACTURES): VITD: 46.56 ng/mL (ref 30.00–100.00)

## 2022-11-06 LAB — TSH: TSH: 3.62 u[IU]/mL (ref 0.35–5.50)

## 2022-11-06 MED ORDER — DULOXETINE HCL 30 MG PO CPEP: 30.0000 mg | ORAL_CAPSULE | Freq: Every day | ORAL | 3 refills | Status: DC

## 2022-11-06 MED ORDER — CELECOXIB 200 MG PO CAPS: 200.0000 mg | ORAL_CAPSULE | Freq: Every day | ORAL | 1 refills | Status: DC

## 2022-11-06 MED ORDER — METHYLPHENIDATE HCL 5 MG PO TABS: 5.0000 mg | ORAL_TABLET | Freq: Three times a day (TID) | ORAL | 0 refills | Status: DC | PRN

## 2022-11-06 NOTE — Patient Instructions (Signed)
We will check the labs today.   We have sent in the duloxetine to take 1 pill daily. Let us know in 3-4 weeks how this is going.

## 2022-11-06 NOTE — Progress Notes (Unsigned)
Subjective:   Patient ID: Jose [name], male    DOB: Jun 13, 1935, 87 y.o.   MRN: [id number]  HPI The patient is an 87 YO man coming in for concerns about decline in health since shoulder replacement surgery. He felt immediately after surgery weakness, poor appetite tiredness and is not improving. He has done some home health PT which he was not complimentary of. He was then called by [facility] to start shoulder pt at their office and seemed to not have heard about this before. He recalls being told prior to surgery that he would be fine in 3 weeks and not need PT. He is down weight since surgery due to poor appetite.   Review of Systems  Constitutional:  Positive for activity change, appetite change, fatigue and unexpected weight change.  HENT: Negative.    Eyes: Negative.   Respiratory:  Negative for cough, chest tightness and shortness of breath.   Cardiovascular:  Negative for chest pain, palpitations and leg swelling.  Gastrointestinal:  Negative for abdominal distention, abdominal pain, constipation, diarrhea, nausea and vomiting.  Musculoskeletal: Negative.   Skin: Negative.   Neurological:  Positive for weakness.  Psychiatric/Behavioral: Negative.      Objective:  Physical Exam Constitutional:      Appearance: He is well-developed.  HENT:     Head: Normocephalic and atraumatic.  Cardiovascular:     Rate and Rhythm: Normal rate and regular rhythm.  Pulmonary:     Effort: Pulmonary effort is normal. No respiratory distress.     Breath sounds: Normal breath sounds. No wheezing or rales.  Abdominal:     General: Bowel sounds are normal. There is no distension.     Palpations: Abdomen is soft.     Tenderness: There is no abdominal tenderness. There is no rebound.  Musculoskeletal:     Cervical back: Normal range of motion.  Skin:    General: Skin is warm and dry.  Neurological:     Mental Status: He is alert and oriented to person, place, and time.      Coordination: Coordination normal.     Comments: Walking with cane fairly steady slow gait     Vitals:   11/06/22 1328  BP: 118/62  Pulse: 96  Temp: 97.8 F (36.6 C)  TempSrc: Oral  SpO2: 98%  Weight: 143 lb (64.9 kg)  Height: 5\' 10"  (1.778 m)    Assessment & Plan:  Visit time 25 minutes in face to face communication with patient and coordination of care, additional 5 minutes spent in record review, coordination or care, ordering tests, communicating/referring to other healthcare professionals, documenting in medical records all on the same day of the visit for total time 30 minutes spent on the visit.

## 2022-11-07 NOTE — Assessment & Plan Note (Signed)
Seems to be worse lately and he has concerns about this. We discussed how anesthesia can cause some decline in symptoms and mental status. He agrees to try cymbalta 30 mg daily new rx done.

## 2022-11-07 NOTE — Assessment & Plan Note (Signed)
Encouraged to continue drinking protein drinks while appetite is poor. Activity level is extremely low at this time which likely contributes to poor appetite.

## 2022-11-07 NOTE — Assessment & Plan Note (Signed)
Worsening weakness after surgery and we did discuss change in function can happen after anesthesia. Checking CMP, CBC, vitamin B1, B12, D, TSH. Adjust as needed. Encouraged to go to PT to help with recovery.

## 2022-11-07 NOTE — Assessment & Plan Note (Signed)
Due to new weakness and fatigue checking thiamine levels.

## 2022-11-07 NOTE — Assessment & Plan Note (Signed)
With memory impairment and he seems to have some expectations after surgery that are not typical including being good after 3 weeks and no PT. I informed him after shoulder replacement it is often 3-6 months for recovery and typically PT is needed. He is stable with alcohol usage.

## 2022-11-08 DIAGNOSIS — M25611 Stiffness of right shoulder, not elsewhere classified: Secondary | ICD-10-CM | POA: Insufficient documentation

## 2022-11-08 DIAGNOSIS — M6281 Muscle weakness (generalized): Secondary | ICD-10-CM | POA: Insufficient documentation

## 2022-11-09 LAB — VITAMIN B1: Vitamin B1 (Thiamine): 9 nmol/L (ref 8–30)

## 2022-11-28 DIAGNOSIS — K219 Gastro-esophageal reflux disease without esophagitis: Secondary | ICD-10-CM | POA: Diagnosis not present

## 2022-11-28 DIAGNOSIS — Z96611 Presence of right artificial shoulder joint: Secondary | ICD-10-CM

## 2022-11-28 DIAGNOSIS — Z791 Long term (current) use of non-steroidal anti-inflammatories (NSAID): Secondary | ICD-10-CM

## 2022-11-28 DIAGNOSIS — G4730 Sleep apnea, unspecified: Secondary | ICD-10-CM

## 2022-11-28 DIAGNOSIS — E785 Hyperlipidemia, unspecified: Secondary | ICD-10-CM | POA: Diagnosis not present

## 2022-11-28 DIAGNOSIS — Z471 Aftercare following joint replacement surgery: Secondary | ICD-10-CM | POA: Diagnosis not present

## 2022-11-28 DIAGNOSIS — Z87891 Personal history of nicotine dependence: Secondary | ICD-10-CM

## 2022-11-28 DIAGNOSIS — K589 Irritable bowel syndrome without diarrhea: Secondary | ICD-10-CM | POA: Diagnosis not present

## 2022-11-28 DIAGNOSIS — M4800 Spinal stenosis, site unspecified: Secondary | ICD-10-CM

## 2022-11-29 DIAGNOSIS — Z87891 Personal history of nicotine dependence: Secondary | ICD-10-CM

## 2022-11-29 DIAGNOSIS — Z471 Aftercare following joint replacement surgery: Secondary | ICD-10-CM | POA: Diagnosis not present

## 2022-11-29 DIAGNOSIS — Z96611 Presence of right artificial shoulder joint: Secondary | ICD-10-CM

## 2022-11-29 DIAGNOSIS — E785 Hyperlipidemia, unspecified: Secondary | ICD-10-CM | POA: Diagnosis not present

## 2022-11-29 DIAGNOSIS — K219 Gastro-esophageal reflux disease without esophagitis: Secondary | ICD-10-CM | POA: Diagnosis not present

## 2022-11-29 DIAGNOSIS — M4800 Spinal stenosis, site unspecified: Secondary | ICD-10-CM

## 2022-11-29 DIAGNOSIS — K589 Irritable bowel syndrome without diarrhea: Secondary | ICD-10-CM | POA: Diagnosis not present

## 2022-11-29 DIAGNOSIS — Z791 Long term (current) use of non-steroidal anti-inflammatories (NSAID): Secondary | ICD-10-CM

## 2022-11-29 DIAGNOSIS — G4730 Sleep apnea, unspecified: Secondary | ICD-10-CM

## 2022-12-07 ENCOUNTER — Telehealth: Payer: Self-pay | Admitting: Internal Medicine

## 2022-12-07 ENCOUNTER — Other Ambulatory Visit: Payer: Self-pay

## 2022-12-07 NOTE — Telephone Encounter (Signed)
Prescription Request  12/07/2022  LOV: 11/06/2022  What is the name of the medication or equipment? methylphenidate (RITALIN) 5 MG tablet   Have you contacted your pharmacy to request a refill? No   Which pharmacy would you like this sent to?  [facility] [address] [place] - [address] [place] [address] Phone: [phone] Fax: [phone]    Patient notified that their request is being sent to the clinical staff for review and that they should receive a response within 2 business days.   Please advise at [facility] [phone]

## 2022-12-08 MED ORDER — METHYLPHENIDATE HCL 5 MG PO TABS: 5.0000 mg | ORAL_TABLET | Freq: Three times a day (TID) | ORAL | 0 refills | Status: DC | PRN

## 2022-12-08 NOTE — Telephone Encounter (Signed)
Sent in

## 2023-01-04 ENCOUNTER — Telehealth: Payer: Self-pay | Admitting: Internal Medicine

## 2023-01-04 ENCOUNTER — Other Ambulatory Visit: Payer: Self-pay

## 2023-01-04 NOTE — Telephone Encounter (Signed)
Prescription Request  01/04/2023  LOV: 11/06/2022  What is the name of the medication or equipment? methylphenidate  Have you contacted your pharmacy to request a refill? Yes   Which pharmacy would you like this sent to?  [facility] [address] [place] - [address] [place] [address] Phone: [phone] Fax: [phone]    Patient notified that their request is being sent to the clinical staff for review and that they should receive a response within 2 business days.   Please advise at Mobile There is no such number on file (mobile).

## 2023-01-05 MED ORDER — METHYLPHENIDATE HCL 5 MG PO TABS: 5.0000 mg | ORAL_TABLET | Freq: Three times a day (TID) | ORAL | 0 refills | Status: DC | PRN

## 2023-01-05 NOTE — Telephone Encounter (Signed)
Sent in refill

## 2023-02-07 ENCOUNTER — Telehealth: Payer: Self-pay

## 2023-02-07 NOTE — Telephone Encounter (Signed)
 Copied from CRM [id number]. Topic: Clinical - Medication Refill >> Feb 07, 2023  1:48 PM Tiffany H wrote: Most Recent Primary Care Visit:  Provider: Bambi Lever A  Department: [facility]  Visit Type: OFFICE VISIT  Date: 11/06/2022  Medication: methylphenidate  (RITALIN ) 5 MG tablet  Patient advised that he took his last pill today.   Has the patient contacted their pharmacy? Yes (Agent: If no, request that the patient contact the pharmacy for the refill. If patient does not wish to contact the pharmacy document the reason why and proceed with request.) (Agent: If yes, when and what did the pharmacy advise?)  Is this the correct pharmacy for this prescription? Yes If no, delete pharmacy and type the correct one.  This is the patient's preferred pharmacy:   [facility] [address] [place] - [address] [place]  [address] Phone: [phone] Fax: [phone]   Has the prescription been filled recently? No.   Is the patient out of the medication? Yes  Has the patient been seen for an appointment in the last year OR does the patient have an upcoming appointment? Yes  Can we respond through MyChart? No  Agent: Please be advised that Rx refills may take up to 3 business days. We ask that you follow-up with your pharmacy.

## 2023-02-07 NOTE — Telephone Encounter (Signed)
 Please send in refill for patient

## 2023-02-08 MED ORDER — METHYLPHENIDATE HCL 5 MG PO TABS: 5.0000 mg | ORAL_TABLET | Freq: Three times a day (TID) | ORAL | 0 refills | Status: DC | PRN

## 2023-02-08 NOTE — Telephone Encounter (Signed)
Have sent in, for future we do recommend giving [place] 3 days notice to ensure he does not run out of meds

## 2023-02-08 NOTE — Addendum Note (Signed)
Addended by: Hillard Danker A on: 02/08/2023 11:44 AM   Modules accepted: Orders

## 2023-02-08 NOTE — Telephone Encounter (Signed)
Informed patient

## 2023-02-14 ENCOUNTER — Other Ambulatory Visit (HOSPITAL_COMMUNITY): Payer: Self-pay

## 2023-02-14 ENCOUNTER — Telehealth: Payer: Self-pay | Admitting: Pharmacy Technician

## 2023-02-14 NOTE — Telephone Encounter (Signed)
Pharmacy Patient Advocate Encounter   Received notification from CoverMyMeds that prior authorization for Methylphenidate HCl 5MG  tablets is required/requested.   Insurance verification completed.   The patient is insured through  [facility]  .   Per test claim: PA required; PA submitted to above mentioned insurance via CoverMyMeds Key/confirmation #/EOC [id number] Status is pending

## 2023-02-14 NOTE — Telephone Encounter (Signed)
Pharmacy Patient Advocate Encounter  Received notification from [employer] that Prior Authorization for Methylphenidate HCl 5MG  tablets has been APPROVED from 02/14/23 to 01/23/24. Ran test claim, Copay is $12.06. This test claim was processed through [facility]- copay amounts may vary at other pharmacies due to pharmacy/plan contracts, or as the patient moves through the different stages of their insurance plan.   PA #/Case ID/Reference #: [id number]  *spoke with pharmacy to process

## 2023-03-15 ENCOUNTER — Other Ambulatory Visit: Payer: Self-pay | Admitting: Internal Medicine

## 2023-03-15 NOTE — Telephone Encounter (Signed)
Copied from CRM [id number]. Topic: Clinical - Medication Refill >> Mar 15, 2023  2:45 PM Alcus Dad wrote: Most Recent Primary Care Visit:  Provider: Hillard Danker A  Department: [facility]  Visit Type: OFFICE VISIT  Date: 11/06/2022  Medication: methylphenidate (RITALIN) 5 MG tablet  Has the patient contacted their pharmacy? Yes (Agent: If no, request that the patient contact the pharmacy for the refill. If patient does not wish to contact the pharmacy document the reason why and proceed with request.) (Agent: If yes, when and what did the pharmacy advise?)  Is this the correct pharmacy for this prescription? Yes If no, delete pharmacy and type the correct one.  This is the patient's preferred pharmacy:  [facility] [address] [place] - [address] [place] [address] Phone: [phone] Fax: [phone]   Has the prescription been filled recently? No  Is the patient out of the medication? No  Has the patient been seen for an appointment in the last year OR does the patient have an upcoming appointment? Yes  Can we respond through MyChart? No  Agent: Please be advised that Rx refills may take up to 3 business days. We ask that you follow-up with your pharmacy.

## 2023-03-21 ENCOUNTER — Other Ambulatory Visit: Payer: Self-pay | Admitting: Internal Medicine

## 2023-03-21 NOTE — Telephone Encounter (Signed)
 Last fill: 08/21/22, 90 tablet 5 refill LOV: 11/06/22

## 2023-03-21 NOTE — Telephone Encounter (Signed)
 Copied from CRM [id number]. Topic: Clinical - Medication Refill >> Mar 21, 2023  3:09 PM Ernst Spell wrote: Most Recent Primary Care Visit:  Provider: Hillard Danker A  Department: [facility]  Visit Type: OFFICE VISIT  Date: 11/06/2022  Medication: Alprazolam  Has the patient contacted their pharmacy? Yes Told him he didn't have refills  Is this the correct pharmacy for this prescription? Yes If no, delete pharmacy and type the correct one.  This is the patient's preferred pharmacy:  [facility] [address], [place] - [address] [place] [address] Phone: [phone] Fax: [phone]   Has the prescription been filled recently? No  Is the patient out of the medication? Yes  Has the patient been seen for an appointment in the last year OR does the patient have an upcoming appointment? No  Can we respond through MyChart? No  Agent: Please be advised that Rx refills may take up to 3 business days. We ask that you follow-up with your pharmacy.

## 2023-03-22 MED ORDER — ALPRAZOLAM 0.5 MG PO TABS: 0.5000 mg | ORAL_TABLET | Freq: Three times a day (TID) | ORAL | 3 refills | Status: DC | PRN

## 2023-03-28 ENCOUNTER — Other Ambulatory Visit: Payer: Self-pay | Admitting: Internal Medicine

## 2023-03-28 NOTE — Telephone Encounter (Signed)
 Copied from CRM [id number]. Topic: Clinical - Medication Refill >> Mar 28, 2023  4:40 PM [name] wrote: Most Recent Primary Care Visit:  Provider: Hillard Danker A  Department: [facility]  Visit Type: OFFICE VISIT  Date: 11/06/2022  Medication: methylphenidate (RITALIN) 5 MG tablet  Has the patient contacted their pharmacy? Yes (Agent: If no, request that the patient contact the pharmacy for the refill. If patient does not wish to contact the pharmacy document the reason why and proceed with request.) (Agent: If yes, when and what did the pharmacy advise?) Controlled substance, advised patient to called office.  Is this the correct pharmacy for this prescription? Yes If no, delete pharmacy and type the correct one.  This is the patient's preferred pharmacy:  [facility] [address] [place] - [address] [place] [address] Phone: [phone] Fax: [phone]   Has the prescription been filled recently? No  Is the patient out of the medication? Yes  Has the patient been seen for an appointment in the last year OR does the patient have an upcoming appointment? Yes  Can we respond through MyChart? Yes  Agent: Please be advised that Rx refills may take up to 3 business days. We ask that you follow-up with your pharmacy.

## 2023-03-28 NOTE — Telephone Encounter (Signed)
 Last Fill: 02/08/23 180 tabs/0 RF  Last OV: 11/06/22 Next OV: None Scheduled  Routing to provider for review/authorization.

## 2023-03-29 MED ORDER — METHYLPHENIDATE HCL 5 MG PO TABS: 5.0000 mg | ORAL_TABLET | Freq: Three times a day (TID) | ORAL | 0 refills | Status: DC | PRN

## 2023-04-17 DIAGNOSIS — Z4789 Encounter for other orthopedic aftercare: Secondary | ICD-10-CM | POA: Insufficient documentation

## 2023-04-17 DIAGNOSIS — M1812 Unilateral primary osteoarthritis of first carpometacarpal joint, left hand: Secondary | ICD-10-CM | POA: Insufficient documentation

## 2023-04-17 DIAGNOSIS — M79645 Pain in left finger(s): Secondary | ICD-10-CM | POA: Insufficient documentation

## 2023-05-21 ENCOUNTER — Telehealth: Payer: Self-pay | Admitting: Internal Medicine

## 2023-05-21 NOTE — Telephone Encounter (Signed)
 Copied from CRM [id number]. Topic: Clinical - Medication Refill >> May 21, 2023  3:14 PM Dimple Francis wrote: Most Recent Primary Care Visit:  Provider: Bambi Lever A  Department: [facility]  Visit Type: OFFICE VISIT  Date: 11/06/2022  Medication: methylphenidate  (RITALIN ) 5 MG tablet  Has the patient contacted their pharmacy? Yes (Agent: If no, request that the patient contact the pharmacy for the refill. If patient does not wish to contact the pharmacy document the reason why and proceed with request.) (Agent: If yes, when and what did the pharmacy advise?)  Is this the correct pharmacy for this prescription? Yes If no, delete pharmacy and type the correct one.  This is the patient's preferred pharmacy:  [facility] [address], [place] - [address] [place] [address] Phone: [phone] Fax: [phone]   Has the prescription been filled recently? Yes  Is the patient out of the medication? Yes  Has the patient been seen for an appointment in the last year OR does the patient have an upcoming appointment? Yes  Can we respond through MyChart? Yes  Agent: Please be advised that Rx refills may take up to 3 business days. We ask that you follow-up with your pharmacy.

## 2023-05-22 NOTE — Telephone Encounter (Signed)
 LOV: 11/06/22 Last fill: 03/29/23, 180 tablet 0 refill

## 2023-05-24 NOTE — Telephone Encounter (Signed)
 Copied from CRM [id number]. Topic: Clinical - Medication Refill >> May 21, 2023  3:14 PM Dimple Francis wrote: Most Recent Primary Care Visit:  Provider: Bambi Lever A  Department: [facility]  Visit Type: OFFICE VISIT  Date: 11/06/2022  Medication: methylphenidate  (RITALIN ) 5 MG tablet  Has the patient contacted their pharmacy? Yes (Agent: If no, request that the patient contact the pharmacy for the refill. If patient does not wish to contact the pharmacy document the reason why and proceed with request.) (Agent: If yes, when and what did the pharmacy advise?)  Is this the correct pharmacy for this prescription? Yes If no, delete pharmacy and type the correct one.  This is the patient's preferred pharmacy:  [facility] [address] [place] - [address] [place] [address] Phone: [phone] Fax: [phone]   Has the prescription been filled recently? Yes  Is the patient out of the medication? Yes  Has the patient been seen for an appointment in the last year OR does the patient have an upcoming appointment? Yes  Can we respond through MyChart? Yes  Agent: Please be advised that Rx refills may take up to 3 business days. We ask that you follow-up with your pharmacy. >> May 24, 2023  3:02 PM Howard Macho wrote: Patient called stating he is checking on previous message about a medication refill

## 2023-05-25 NOTE — Telephone Encounter (Signed)
 This was already refused due to needs visit.

## 2023-05-25 NOTE — Telephone Encounter (Signed)
 Called pt and LVM

## 2023-05-28 ENCOUNTER — Other Ambulatory Visit: Payer: Self-pay | Admitting: Internal Medicine

## 2023-05-28 NOTE — Telephone Encounter (Signed)
 We haven't filled this since 03/29/23 so likely he is already out for some time if taking as recommended.

## 2023-05-28 NOTE — Telephone Encounter (Signed)
 Pt has a visit on 5/9

## 2023-05-28 NOTE — Telephone Encounter (Unsigned)
 Copied from CRM [id number]. Topic: Clinical - Prescription Issue >> May 28, 2023  8:48 AM Jose [name] wrote: Reason for CRM: Patient called said he was advised to schedule an appointment before receiving a refill for methylphenidate  (RITALIN ) 5 MG tablet. Scheduled appointment for 06/11/23 but he said it's too far out and he will not be able to make it without his medication for that long.

## 2023-06-01 ENCOUNTER — Ambulatory Visit: Admitting: Internal Medicine

## 2023-06-11 ENCOUNTER — Other Ambulatory Visit (HOSPITAL_COMMUNITY): Payer: Self-pay

## 2023-06-11 ENCOUNTER — Ambulatory Visit (INDEPENDENT_AMBULATORY_CARE_PROVIDER_SITE_OTHER): Admitting: Internal Medicine

## 2023-06-11 ENCOUNTER — Encounter: Payer: Self-pay | Admitting: Internal Medicine

## 2023-06-11 VITALS — BP 140/100 | HR 89 | Temp 97.4°F | Ht 70.0 in | Wt 157.0 lb

## 2023-06-11 DIAGNOSIS — F1010 Alcohol abuse, uncomplicated: Secondary | ICD-10-CM | POA: Diagnosis not present

## 2023-06-11 DIAGNOSIS — G9332 Myalgic encephalomyelitis/chronic fatigue syndrome: Secondary | ICD-10-CM | POA: Diagnosis not present

## 2023-06-11 DIAGNOSIS — R2689 Other abnormalities of gait and mobility: Secondary | ICD-10-CM

## 2023-06-11 MED ORDER — AMPHETAMINE-DEXTROAMPHET ER 20 MG PO CP24
20.0000 mg | ORAL_CAPSULE | ORAL | 0 refills | Status: DC
Start: 2023-06-11 — End: 2023-10-16

## 2023-06-11 NOTE — Patient Instructions (Signed)
 We have sent in adderall to take 1 pill daily instead of the 6 pills a day of the methylphenidate  to help with energy.

## 2023-06-11 NOTE — Progress Notes (Signed)
   Subjective:   Patient ID: Jose [name], male    DOB: 10-05-1935, 88 y.o.   MRN: [id number]  HPI The patient is an 88 YO man coming in for follow up methylphenidate . Previously had been maintained on 5-10 mg oral TID (6 pills daily) and had taken that for 50 years or more. He did not come in for visit and refills were withheld. Last rx 03/29/23. He is unsure when he ran out and states he is out for several weeks then states he is still taking but rationing and mentions he has borrowed from friends and investigating methods of getting this off the street. He is not able to tell me when he last filled and account for how he had so many pills left over. He may have been taking 5 pills daily the last month or so to conserve but this would not account for having an extra month of medication (5 pills daily would give an extra 30 pills which would last 6 days). He is currently drinking moderately per his reports which he feels is fine.   Review of Systems  Constitutional:  Positive for activity change, appetite change and fatigue.  HENT: Negative.    Eyes: Negative.   Respiratory:  Negative for cough, chest tightness and shortness of breath.   Cardiovascular:  Negative for chest pain, palpitations and leg swelling.  Gastrointestinal:  Negative for abdominal distention, abdominal pain, constipation, diarrhea, nausea and vomiting.  Musculoskeletal:  Positive for arthralgias and myalgias.  Skin: Negative.   Neurological:  Positive for weakness.  Psychiatric/Behavioral: Negative.      Objective:  Physical Exam Constitutional:      Appearance: He is well-developed.  HENT:     Head: Normocephalic and atraumatic.  Cardiovascular:     Rate and Rhythm: Normal rate and regular rhythm.  Pulmonary:     Effort: Pulmonary effort is normal. No respiratory distress.     Breath sounds: Normal breath sounds. No wheezing or rales.  Abdominal:     General: Bowel sounds are normal. There is no distension.      Palpations: Abdomen is soft.     Tenderness: There is no abdominal tenderness. There is no rebound.  Musculoskeletal:        General: Tenderness present.     Cervical back: Normal range of motion.  Skin:    General: Skin is warm and dry.  Neurological:     Mental Status: He is alert and oriented to person, place, and time.     Coordination: Coordination abnormal.     Comments: Slow to stand and some uncoordinated movements     Vitals:   06/11/23 1424 06/11/23 1431  BP: (!) 140/100 (!) 140/100  Pulse: 89   Temp: (!) 97.4 F (36.3 C)   TempSrc: Oral   SpO2: 99%   Weight: 157 lb (71.2 kg)   Height: 5\' 10"  (1.778 m)     Assessment & Plan:  Visit time 30 minutes in face to face communication with patient and coordination of care, additional 10 minutes spent in record review, coordination or care, ordering tests, communicating/referring to other healthcare professionals, documenting in medical records all on the same day of the visit for total time 40 minutes spent on the visit.

## 2023-06-12 NOTE — Assessment & Plan Note (Signed)
 He is currently drinking alcohol  in "moderate amounts". Given his multiple controlled substances this is risky behavior and he has been asked to stop drinking alcohol  on multiple occasions.

## 2023-06-12 NOTE — Assessment & Plan Note (Signed)
 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

## 2023-06-12 NOTE — Assessment & Plan Note (Signed)
 He had previously been on methylphenidate  5 mg (5-10 mg TID) and had been getting #180 per month. Given his lack of ability to tell us  how he is taking convincingly and unable to account for how he is taking currently I will convert to adderall 20 mg daily to reduce pill burden. We can adjust dosing but will not resume multiple pills daily. He was unable to clearly tell me if he was out of medication or still taking or if there was a significant difference off medication.

## 2023-06-13 ENCOUNTER — Other Ambulatory Visit (HOSPITAL_COMMUNITY): Payer: Self-pay

## 2023-06-13 ENCOUNTER — Telehealth: Payer: Self-pay

## 2023-06-13 NOTE — Telephone Encounter (Signed)
 Pharmacy Patient Advocate Encounter  Received notification from [employer] that Prior Authorization for Amphetamine -Dextroamphet ER 20MG  er capsules has been DENIED.  See denial reason below. No denial letter attached in CMM. Will attach denial letter to Media tab once received.   PA #/Case ID/Reference #: [id number]

## 2023-06-13 NOTE — Telephone Encounter (Signed)
 Pharmacy Patient Advocate Encounter   Received notification from CoverMyMeds that prior authorization for Amphetamine -Dextroamphet ER 20MG  er capsules is required/requested.   Insurance verification completed.   The patient is insured through [employer] .   Per test claim: PA required; PA submitted to above mentioned insurance via CoverMyMeds Key/confirmation #/EOC [id number] Status is pending

## 2023-06-13 NOTE — Telephone Encounter (Signed)
 Copied from CRM [id number]. Topic: Clinical - Prescription Issue >> Jun 13, 2023  4:29 PM Shereese L wrote: Reason for CRM: patient stated that he's willing to take the alt medication which is the previous medication in place of Amphetamine -Dextroamphet ER 20MG  er capsules  Please follow up

## 2023-06-19 MED ORDER — GUANFACINE HCL ER 1 MG PO TB24: 1.0000 mg | ORAL_TABLET | Freq: Every day | ORAL | 0 refills | Status: DC

## 2023-06-19 NOTE — Addendum Note (Signed)
 Addended by: Bambi Lever A on: 06/19/2023 04:21 PM   Modules accepted: Orders

## 2023-06-19 NOTE — Telephone Encounter (Signed)
 We have sent in the alternative for him to try.

## 2023-06-21 ENCOUNTER — Other Ambulatory Visit (HOSPITAL_COMMUNITY): Payer: Self-pay

## 2023-06-21 ENCOUNTER — Telehealth: Payer: Self-pay

## 2023-06-21 NOTE — Telephone Encounter (Signed)
 Pharmacy Patient Advocate Encounter   Received notification from [employer]base that prior authorization for guanFACINE HCl ER 1MG  er tablets  is required/requested.   Insurance verification completed.   The patient is insured through [employer] .   Per test claim: PA required; PA submitted to above mentioned insurance via CoverMyMeds Key/confirmation #/EOC [id number] Status is pending

## 2023-06-22 ENCOUNTER — Other Ambulatory Visit (HOSPITAL_COMMUNITY): Payer: Self-pay

## 2023-06-22 NOTE — Telephone Encounter (Signed)
 Pharmacy Patient Advocate Encounter  Received notification from [facility] that Prior Authorization for guanFACINE HCl ER 1MG  er tablets  has been APPROVED from 01/24/23 to 06/20/24. Ran test claim, Copay is $24. This test claim was processed through [facility]- copay amounts may vary at other pharmacies due to pharmacy/plan contracts, or as the patient moves through the different stages of their insurance plan.   PA #/Case ID/Reference #: [id number]

## 2023-06-28 ENCOUNTER — Other Ambulatory Visit (HOSPITAL_COMMUNITY): Payer: Self-pay

## 2023-07-02 ENCOUNTER — Encounter: Payer: Self-pay | Admitting: Internal Medicine

## 2023-07-02 ENCOUNTER — Ambulatory Visit (INDEPENDENT_AMBULATORY_CARE_PROVIDER_SITE_OTHER): Admitting: Internal Medicine

## 2023-07-02 ENCOUNTER — Telehealth: Payer: Self-pay

## 2023-07-02 VITALS — BP 140/80 | HR 73 | Temp 98.1°F | Ht 70.0 in | Wt 157.0 lb

## 2023-07-02 DIAGNOSIS — L299 Pruritus, unspecified: Secondary | ICD-10-CM | POA: Diagnosis not present

## 2023-07-02 DIAGNOSIS — G9332 Myalgic encephalomyelitis/chronic fatigue syndrome: Secondary | ICD-10-CM

## 2023-07-02 DIAGNOSIS — F1010 Alcohol abuse, uncomplicated: Secondary | ICD-10-CM | POA: Diagnosis not present

## 2023-07-02 MED ORDER — TRIAMCINOLONE ACETONIDE 0.1 % EX CREA: 1.0000 | TOPICAL_CREAM | Freq: Two times a day (BID) | CUTANEOUS | 0 refills | Status: DC

## 2023-07-02 NOTE — Assessment & Plan Note (Signed)
 He has tried and failed intuniv  (guanfencine) and got headaches with this. We will need to re-initiate PA for adderall 20 mg daily to try. He asked for return to methylphenidate  today and I did not agree with this. He was informed the reason why was that he was unable to provide an accounting for how he takes his medication and how he had extra and he had previously been stating he would buy this. He then stated today he mostly takes 5 per day (previously stated 6 per day). I did not agree with going back on ritalin . We will work on getting PA approved for adderall 20 mg daily.

## 2023-07-02 NOTE — Progress Notes (Unsigned)
   Subjective:   Patient ID: Jose [name], male    DOB: 07/13/1935, 88 y.o.   MRN: [id number]  HPI The patient is an 88 YO man coming in for follow up change in add/chronic fatigue meds. We were planning to change to daily controlled release adderall. He was unable to get this without trying and failing another agent guafenacin which was then prescribed for him. He has previously been on ritalin  #180 per month for some time. He had come in for visit due to refill not being approved and had not refilled with us  for some time and he was unable to account for how he was taking medication and was concurrently using alcohol  (has been asked to stop in the past). We did not want to continue prescribing that quantity of medication for him. He has tried intuniv  and is getting headaches and this is not helping him at all.  Review of Systems  Constitutional:  Positive for activity change and fatigue.  HENT: Negative.    Eyes: Negative.   Respiratory:  Negative for cough, chest tightness and shortness of breath.   Cardiovascular:  Negative for chest pain, palpitations and leg swelling.  Gastrointestinal:  Negative for abdominal distention, abdominal pain, constipation, diarrhea, nausea and vomiting.  Musculoskeletal: Negative.   Skin: Negative.        Itching ears and body  Neurological: Negative.   Psychiatric/Behavioral:  Positive for decreased concentration and dysphoric mood.     Objective:  Physical Exam Constitutional:      Appearance: He is well-developed.  HENT:     Head: Normocephalic and atraumatic.  Cardiovascular:     Rate and Rhythm: Normal rate and regular rhythm.  Pulmonary:     Effort: Pulmonary effort is normal. No respiratory distress.     Breath sounds: Normal breath sounds. No wheezing or rales.  Abdominal:     General: Bowel sounds are normal. There is no distension.     Palpations: Abdomen is soft.     Tenderness: There is no abdominal tenderness. There is no  rebound.  Musculoskeletal:     Cervical back: Normal range of motion.  Skin:    General: Skin is warm and dry.  Neurological:     Mental Status: He is alert and oriented to person, place, and time.     Coordination: Coordination normal.     Vitals:   07/02/23 1525 07/02/23 1527  BP: (!) 140/80 (!) 140/80  Pulse: 73   Temp: 98.1 F (36.7 C)   TempSrc: Oral   SpO2: 97%   Weight: 157 lb (71.2 kg)   Height: 5\' 10"  (1.778 m)     Assessment & Plan:  Visit time 20 minutes in face to face communication with patient and coordination of care, additional 10 minutes spent in record review, coordination or care, ordering tests, communicating/referring to other healthcare professionals, documenting in medical records all on the same day of the visit for total time 30 minutes spent on the visit.

## 2023-07-02 NOTE — Assessment & Plan Note (Signed)
 He had been advised not to drink alcohol  on multiple occasions and is currently using alcohol  and has no intention to quit.

## 2023-07-02 NOTE — Assessment & Plan Note (Signed)
 In ears and on back and legs. Rx triamcinolone  to use in ears and on legs, back, arms.

## 2023-07-02 NOTE — Patient Instructions (Signed)
 I have sent in the triamcinolone  cream to use on a q-tip twice a day in the ear. You can also use this on the body for the areas of itching.

## 2023-07-03 ENCOUNTER — Telehealth: Payer: Self-pay

## 2023-07-03 ENCOUNTER — Other Ambulatory Visit (HOSPITAL_COMMUNITY): Payer: Self-pay

## 2023-07-03 ENCOUNTER — Encounter: Payer: Self-pay | Admitting: Internal Medicine

## 2023-07-03 NOTE — Telephone Encounter (Signed)
 Prior authorization was denied on 06/13/23. Requesting another PA but tried to submit and it was denied due to previous request.   Can submit an eAppeal request, contact the plan at [phone] or fax request to [phone]  Denial letter indexed to media tab.   Key: [id number]

## 2023-07-04 ENCOUNTER — Telehealth: Payer: Self-pay | Admitting: Pharmacist

## 2023-07-04 NOTE — Telephone Encounter (Signed)
 E-Appeal has been submitted. Will advise when response is received, please be advised that most companies may take 30 days to make a decision. Appeal letter and supporting documentation have been uploaded and submitted via CMM website.  Thank you, Dene Fines, PharmD Clinical Pharmacist  [place]  Direct Dial: [phone]

## 2023-07-09 ENCOUNTER — Other Ambulatory Visit (HOSPITAL_COMMUNITY): Payer: Self-pay

## 2023-07-10 NOTE — Telephone Encounter (Signed)
Insurance denied the appeal

## 2023-07-12 ENCOUNTER — Telehealth: Payer: Self-pay | Admitting: Internal Medicine

## 2023-07-12 NOTE — Telephone Encounter (Signed)
 Copied from CRM [id number]. Topic: Clinical - Prescription Issue >> Jul 12, 2023 10:41 AM Chuck Crater wrote: Reason for CRM: Patient stated that he did not recieved his pills that was suppose to be prescribed by doctor. Pharmacy did not receive guanFACINE  (INTUNIV ) 1 MG TB24 ER tablet. Pharmacy does not have his prescription.

## 2023-07-16 NOTE — Telephone Encounter (Signed)
 Copied from CRM [id number]. Topic: Clinical - Medication Question >> Jul 13, 2023  4:25 PM Berneda FALCON wrote: Reason for CRM: Patient states that he is waiting on the medication amphetamine -dextroamphetamine (ADDERALL XR) 20 MG 24 hr capsule.  Pt states that he is willing to pay out of pocket while we are working on the appeal. Please keep him the loop on the appeal process and go ahead and send the medication over to the following pharmacy for him to manage symptoms while he waits for the approval.   [facility] [address] [place] - [address] [place] [address] Phone: [phone] Fax: [phone] Hours: Not open 24 hours  Patient callback is [phone]

## 2023-07-18 ENCOUNTER — Telehealth: Payer: Self-pay | Admitting: Internal Medicine

## 2023-07-18 NOTE — Telephone Encounter (Signed)
 Patient left an envelope up front for Dr. Rollene - it was placed in her box.

## 2023-07-19 ENCOUNTER — Other Ambulatory Visit: Payer: Self-pay

## 2023-07-19 ENCOUNTER — Telehealth: Payer: Self-pay

## 2023-07-19 MED ORDER — GUANFACINE HCL ER 1 MG PO TB24: 1.0000 mg | ORAL_TABLET | Freq: Every day | ORAL | 0 refills | Status: DC

## 2023-07-19 NOTE — Telephone Encounter (Signed)
 I have sent medication refill to [facility]

## 2023-07-19 NOTE — Telephone Encounter (Signed)
 Resent

## 2023-07-23 NOTE — Telephone Encounter (Signed)
 Sent over to PA team to start and appeal

## 2023-07-23 NOTE — Telephone Encounter (Signed)
 This has been recevied but it is a hand written letter and I am unable to read the hand writing.

## 2023-08-08 ENCOUNTER — Ambulatory Visit

## 2023-08-21 ENCOUNTER — Telehealth: Payer: Self-pay | Admitting: Internal Medicine

## 2023-08-21 NOTE — Telephone Encounter (Signed)
 Received patient letter in my box. He is asking about his adderall being denied by insurnace. I cannot control if his insurance will cover this for his diagnosis. I advise that he get the medicine to start and write an appeal to the insurance company to see if they will cover. I am unable to prescribe his prior methylphenidate  for reasons we have already discussed.

## 2023-08-21 NOTE — Telephone Encounter (Signed)
 Error

## 2023-08-28 ENCOUNTER — Encounter: Payer: Self-pay | Admitting: Internal Medicine

## 2023-08-28 ENCOUNTER — Ambulatory Visit: Admitting: Internal Medicine

## 2023-08-28 VITALS — BP 126/80 | HR 78 | Temp 97.8°F | Ht 70.0 in | Wt 158.0 lb

## 2023-08-28 DIAGNOSIS — L299 Pruritus, unspecified: Secondary | ICD-10-CM

## 2023-08-28 DIAGNOSIS — Z0001 Encounter for general adult medical examination with abnormal findings: Secondary | ICD-10-CM

## 2023-08-28 DIAGNOSIS — E782 Mixed hyperlipidemia: Secondary | ICD-10-CM

## 2023-08-28 DIAGNOSIS — R739 Hyperglycemia, unspecified: Secondary | ICD-10-CM | POA: Diagnosis not present

## 2023-08-28 DIAGNOSIS — Z0000 Encounter for general adult medical examination without abnormal findings: Secondary | ICD-10-CM

## 2023-08-28 DIAGNOSIS — F1010 Alcohol abuse, uncomplicated: Secondary | ICD-10-CM

## 2023-08-28 DIAGNOSIS — L603 Nail dystrophy: Secondary | ICD-10-CM

## 2023-08-28 DIAGNOSIS — G9332 Myalgic encephalomyelitis/chronic fatigue syndrome: Secondary | ICD-10-CM

## 2023-08-28 NOTE — Patient Instructions (Addendum)
 We do want you to try the one a day adderall pill for the chronic fatigue. This prescription is still at your pharmacy so contact them to fill this.   Mr. Snee , Thank you for taking time to come for your Medicare Wellness Visit. I appreciate your ongoing commitment to your health goals. Please review the following plan we discussed and let me know if I can assist you in the future.   These are the goals we discussed:  Goals   None     This is a list of the screening recommended for you and due dates:  Health Maintenance  Topic Date Due   Medicare Annual Wellness Visit  Never done   Pneumococcal Vaccine for age over 11 (1 of 1 - PCV) Never done   COVID-19 Vaccine (1 - 2024-25 season) Never done   Flu Shot  08/24/2023   DTaP/Tdap/Td vaccine (2 - Td or Tdap) 08/28/2028   Hepatitis B Vaccine  Aged Out   HPV Vaccine  Aged Out   Meningitis B Vaccine  Aged Out   Zoster (Shingles) Vaccine  Discontinued

## 2023-08-28 NOTE — Progress Notes (Unsigned)
 Subjective:   Patient ID: Jose [name], male    DOB: [birth year], 88 y.o.   MRN: [id number]  Discussed the use of AI scribe software for clinical note transcription with the patient, who gave verbal consent to proceed.  History of Present Illness Jose [name] is an 88 year old male who presents for a Medicare annual wellness visit and evaluation of chronic fatigue.  He experiences persistent fatigue and tiredness, describing himself as feeling 'pretty rotten' and 'real tired and fatigued' for half the day. He has chronic fatigue syndrome and encephalitis, which have not improved. He requests to resume methylphenidate , which he has been on for over fifty years without side effects, as it is the only medication that helps him function. Previous attempts with Adderall and another medication were unsuccessful; the latter caused headaches and was not covered by insurance for chronic fatigue.  He experiences a dry cough, which he describes as a minor nuisance. No new chest pains or breathing difficulties. He quit smoking fifty-five years ago.  He reports earaches, primarily in the right ear, which he has been treating with a Q-tip and a prescribed medication, but with minimal relief. No change in hearing, though he has some ear damage from decades ago.  He mentions a history of a torn rotator cuff surgery with no improvement and notes that the pain has migrated to his left shoulder. He also has arthritis in his left thumb, which is spreading across his hand.  He experiences some incontinence, which is bothersome when not at home, and has a toenail that is splitting, causing concern about potential ripping.  He reports generalized itching, primarily on his back, which is severe at times. His partner applies cortisone cream nightly, but the itching moves to different areas.  No new headaches or migraines, and his vision remains stable at approximately 20/25. He has several  pairs of trifocals.  He reports a normal appetite and good sleep. He drinks alcohol  occasionally, much less than before, and only when his partner is not around.  No new skin spots or moles of concern, though he has some sun spots and bumps that occasionally itch. Here for medicare wellness, no new complaints. Please see A/P for status and treatment of chronic medical problems.   Diet: heart healthy Physical activity: sedentary Depression/mood screen: negative Hearing: intact to whispered voice, mild loss stable Visual acuity: grossly normal, overdue for annual eye exam  ADLs: capable Fall risk: low Home safety: good Cognitive evaluation: intact to orientation, naming, recall and repetition EOL planning: adv directives discussed  Flowsheet Row Office Visit from 07/02/2023 in [facility] [place]  PHQ-2 Total Score 0    Flowsheet Row Office Visit from 07/02/2023 in [facility]  PHQ-9 Total Score 0      08/21/2022    1:35 PM 11/06/2022    1:30 PM 06/11/2023    2:32 PM 07/02/2023    3:27 PM 08/28/2023    4:00 PM  Fall Risk  Falls in the past year? 0 1 0 1 1  Was there an injury with Fall? 0 0 0 0 0  Fall Risk Category Calculator 0 1 0 2 1  Fall risk Follow up Falls evaluation completed Falls evaluation completed Falls evaluation completed Falls evaluation completed Falls evaluation completed    I have personally reviewed and have noted 1. The patient's medical and social history - reviewed today no changes 2. Their use of  alcohol , tobacco or illicit drugs 3. Their current medications and supplements 4. The patient's functional ability including ADL's, fall risks, home safety risks and hearing or visual impairment. 5. Diet and physical activities 6. Evidence for depression or mood disorders 7. Care team reviewed and updated 8.  The patient is not on an opioid pain medication.  Patient Care Team: [name], MD  as PCP - General (Internal Medicine) Georjean Darice HERO, MD as Consulting Physician (Neurology) Past Medical History:  Diagnosis Date   Acute meniscal tear of knee    RIGHT KNEE   Arthritis    Cancer (HCC)    skin   CAP (community acquired pneumonia) 12/08/2021   GERD (gastroesophageal reflux disease) 11/19/2020   Hives    PER PT UNKNOWN WHY   Hyperlipidemia 12/08/2021   IBS (irritable bowel syndrome)    Mild sleep apnea    PER STUDY  11-30-2010--  NO CPAP   Neuromuscular disorder (HCC)    Spinal stenosis    Stroke (HCC)    unsure per pt   Past Surgical History:  Procedure Laterality Date   APPENDECTOMY     COLONOSCOPY     HEMORRHOID SURGERY     KNEE ARTHROSCOPY Bilateral    x4   KNEE ARTHROSCOPY WITH LATERAL MENISECTOMY Right 04/12/2012   Procedure: RIGHT KNEE ARTHROSCOPY WITH PARTIAL MEDIAL AND LATERAL MENISECTOMY;  Surgeon: [name], MD;  Location: [facility];  Service: Orthopedics;  Laterality: Right;   REVERSE SHOULDER ARTHROPLASTY Right 10/13/2022   Procedure: REVERSE SHOULDER ARTHROPLASTY;  Surgeon: [name], MD;  Location: [facility];  Service: Orthopedics;  Laterality: Right;  interscalene block flip room   SHOULDER ARTHROSCOPY WITH ROTATOR CUFF REPAIR AND SUBACROMIAL DECOMPRESSION  12-21-1999  &  01-28-2004   Family History  Problem Relation Age of Onset   Heart disease Mother    Heart disease Father    Review of Systems  Objective:  Physical Exam  Vitals:   08/28/23 1557  BP: 126/80  Pulse: 78  Temp: 97.8 F (36.6 C)  TempSrc: Oral  SpO2: 98%  Weight: 158 lb (71.7 kg)  Height: 5' 10 (1.778 m)   Assessment and Plan Assessment & Plan Adult Wellness Visit   He maintains minimal alcohol  consumption and engages in some physical activity. Perform the Medicare annual wellness visit and order routine laboratory tests.  Chronic fatigue syndrome   Chronic fatigue syndrome continues with significant fatigue. Ritalin  remains  effective for over 50 years without adverse effects. Continue Ritalin  as previously prescribed.  Right ear pain and pruritus   Right ear pain and pruritus persist with minimal relief from topical treatment.  Arthritis of left thumb and hand   Arthritis in the left thumb and hand is present but manageable.  Urinary incontinence   Urinary incontinence is noted as a nuisance, especially outside the home.  Generalized pruritus   Severe generalized pruritus, primarily on the back, experiences some relief from cortisone cream.  Dry cough   Dry cough is possibly related to postnasal drip. Lungs are clear on examination.  Onychoschizia (splitting toenail)   Toenail splitting is confirmed on examination with concern about catching and pulling. Refer to a podiatrist if the toenail worsens or causes significant issues.

## 2023-08-29 DIAGNOSIS — Z0001 Encounter for general adult medical examination with abnormal findings: Secondary | ICD-10-CM | POA: Insufficient documentation

## 2023-08-29 DIAGNOSIS — L603 Nail dystrophy: Secondary | ICD-10-CM | POA: Insufficient documentation

## 2023-08-29 LAB — COMPREHENSIVE METABOLIC PANEL WITH GFR
ALT: 11 U/L (ref 0–53)
AST: 20 U/L (ref 0–37)
Albumin: 4.3 g/dL (ref 3.5–5.2)
Alkaline Phosphatase: 58 U/L (ref 39–117)
BUN: 11 mg/dL (ref 6–23)
CO2: 30 meq/L (ref 19–32)
Calcium: 9.9 mg/dL (ref 8.4–10.5)
Chloride: 103 meq/L (ref 96–112)
Creatinine, Ser: 0.88 mg/dL (ref 0.40–1.50)
GFR: 77.95 mL/min (ref >60.00)
Glucose, Bld: 97 mg/dL (ref 70–99)
Potassium: 4.1 meq/L (ref 3.5–5.1)
Sodium: 139 meq/L (ref 135–145)
Total Bilirubin: 0.5 mg/dL (ref 0.2–1.2)
Total Protein: 7.3 g/dL (ref 6.0–8.3)

## 2023-08-29 LAB — HEMOGLOBIN A1C: Hgb A1c MFr Bld: 5.7 % (ref 4.6–6.5)

## 2023-08-29 LAB — LIPID PANEL
Cholesterol: 204 mg/dL — ABNORMAL HIGH (ref 0–200)
HDL: 72.6 mg/dL (ref >39.00)
LDL Cholesterol: 82 mg/dL (ref 0–99)
NonHDL: 131.54
Total CHOL/HDL Ratio: 3
Triglycerides: 248 mg/dL — ABNORMAL HIGH (ref 0.0–149.0)
VLDL: 49.6 mg/dL — ABNORMAL HIGH (ref 0.0–40.0)

## 2023-08-29 LAB — CBC
HCT: 42.6 % (ref 39.0–52.0)
Hemoglobin: 14 g/dL (ref 13.0–17.0)
MCHC: 32.9 g/dL (ref 30.0–36.0)
MCV: 91.4 fl (ref 78.0–100.0)
Platelets: 210 K/uL (ref 150.0–400.0)
RBC: 4.67 Mil/uL (ref 4.22–5.81)
RDW: 14.1 % (ref 11.5–15.5)
WBC: 6.3 K/uL (ref 4.0–10.5)

## 2023-08-29 NOTE — Assessment & Plan Note (Signed)
 Partial tear of the nail and not lifting or catching. Continue monitoring for now. If this progresses or catches on things will refer to podiatry for removal.

## 2023-08-29 NOTE — Assessment & Plan Note (Signed)
 Needs follow up HgA1c which is ordered today.

## 2023-08-29 NOTE — Assessment & Plan Note (Signed)
 Needs lipid panel which is ordered today.

## 2023-08-29 NOTE — Assessment & Plan Note (Signed)
 Flu shot yearly. Pneumonia declines. Shingrix complete. Tetanus due 2030. Colonoscopy aged out. Counseled about sun safety and mole surveillance. Counseled about the dangers of distracted driving. Given 10 year screening recommendations.

## 2023-08-29 NOTE — Assessment & Plan Note (Signed)
 Overall stable and could be related to dry skin, excessive alcohol  usage. Reviewed last imaging abdomen without signs of cirrhosis in the liver.

## 2023-08-29 NOTE — Assessment & Plan Note (Signed)
 His insurance has not approved adderall for this indication. I am unwilling to prescribe methylphenidate  any longer given his concurrent substances including alprazolam , alcohol  and methylphenidate . He was unable to consistently describe how he took these medications and how he was taking them months after we had prescribed and admitted to getting them from people. I am only willing to prescribe a one a day stimulant and he will have to get from pharmacy despite lack of coverage.

## 2023-08-29 NOTE — Assessment & Plan Note (Signed)
 He is still drinking when not around others and is unable to clarify exact usage. He states less than before. We have recommended multiple times no alcohol  usage which he does not adhere to.

## 2023-08-31 ENCOUNTER — Telehealth: Payer: Self-pay | Admitting: Internal Medicine

## 2023-08-31 NOTE — Telephone Encounter (Signed)
 Copied from CRM [id number]. Topic: Clinical - Prescription Issue >> Aug 31, 2023  3:59 PM Mia F wrote: Reason for CRM: Pt says he was told that a substitute for Ritalin  was going to be sent to the pharmacy for him but nothing has been sent. Ot is not sure the name of the medication and says he never taken it before so I was unable to put in a med refill request for pt. I do not see any noted regarding this. Please advise

## 2023-09-03 ENCOUNTER — Ambulatory Visit: Payer: Self-pay | Admitting: Internal Medicine

## 2023-09-03 NOTE — Telephone Encounter (Signed)
 I spoke with patient, he is aware of message below. I called and spoke with pharmacy, they will fill Rx for pt.

## 2023-09-03 NOTE — Telephone Encounter (Signed)
 His prescription is already at pharmacy he needs to fill. This is adderall.

## 2023-09-05 ENCOUNTER — Ambulatory Visit

## 2023-09-05 VITALS — Ht 70.0 in | Wt 158.0 lb

## 2023-09-05 DIAGNOSIS — Z0000 Encounter for general adult medical examination without abnormal findings: Secondary | ICD-10-CM

## 2023-09-05 NOTE — Progress Notes (Signed)
 Subjective:   Jose [name] is a 88 y.o. who presents for a Medicare Wellness preventive visit.  As a reminder, Annual Wellness Visits don't include a physical exam, and some assessments may be limited, especially if this visit is performed virtually. We may recommend an in-person follow-up visit with your provider if needed.  Visit Complete: Virtual I connected with  Jose [name] on 09/05/23 by a audio enabled telemedicine application and verified that I am speaking with the correct person using two identifiers.  Patient Location: Home  Provider Location: Home Office  I discussed the limitations of evaluation and management by telemedicine. The patient expressed understanding and agreed to proceed.  Vital Signs: Because this visit was a virtual/telehealth visit, some criteria may be missing or patient reported. Any vitals not documented were not able to be obtained and vitals that have been documented are patient reported.  VideoDeclined- This patient declined Librarian, academic. Therefore the visit was completed with audio only.  Persons Participating in Visit: Patient.  AWV Questionnaire: No: Patient Medicare AWV questionnaire was not completed prior to this visit.  Cardiac Risk Factors include: advanced age (>75men, >74 women);male gender, Risk factor comments: CVA     Objective:    Today's Vitals   09/05/23 1534  Weight: 158 lb (71.7 kg)  Height: 5' 10 (1.778 m)   Body mass index is 22.67 kg/m.     09/05/2023    3:45 PM 10/13/2022    2:43 PM 10/04/2022    2:37 PM 12/08/2021    3:56 PM 12/08/2021    4:38 AM 11/20/2021    3:29 PM 11/14/2021    8:48 PM  Advanced Directives  Does Patient Have a Medical Advance Directive? Yes No No Yes Yes Yes Yes  Type of Advance Directive Living will   Healthcare Power of [employer] Power of [employer] Power of Attorney Healthcare Power of Attorney  Does patient want to  make changes to medical advance directive?    No - Patient declined  No - Patient declined No - Patient declined  Copy of Healthcare Power of Attorney in Chart? No - copy requested   No - copy requested  No - copy requested   Would patient like information on creating a medical advance directive?  No - Patient declined No - Patient declined        Current Medications (verified) Outpatient Encounter Medications as of 09/05/2023  Medication Sig   ALPRAZolam  (XANAX ) 0.5 MG tablet Take 1 tablet (0.5 mg total) by mouth 3 (three) times daily as needed for anxiety.   amphetamine -dextroamphetamine (ADDERALL XR) 20 MG 24 hr capsule Take 1 capsule (20 mg total) by mouth every morning.   ascorbic acid  (VITAMIN C ) 500 MG tablet Take 1 tablet (500 mg total) by mouth daily.   bisacodyl  (DULCOLAX) 5 MG EC tablet Take 1 tablet (5 mg total) by mouth daily as needed for moderate constipation.   caffeine  200 MG TABS tablet Take 200 mg by mouth daily as needed (Stay awake).   calcium  carbonate (TUMS EX) 750 MG chewable tablet Chew 3 tablets by mouth at bedtime.   celecoxib  (CELEBREX ) 200 MG capsule Take 1 capsule (200 mg total) by mouth daily.   DULoxetine  (CYMBALTA ) 30 MG capsule Take 1 capsule (30 mg total) by mouth daily.   HYDROCORTISONE  EX Apply 1 Application topically daily at 12 noon. 10   Multiple Vitamin (MULTIVITAMIN WITH MINERALS) TABS tablet Take 1 tablet by mouth daily.  OVER THE COUNTER MEDICATION Take 2.5 mLs by mouth daily as needed (Stomach). Baking Soda   Oxymetazoline  HCl (NASAL SPRAY NA) Place 1 spray into the nose every other day.   triamcinolone  cream (KENALOG ) 0.1 % Apply 1 Application topically 2 (two) times daily.   No facility-administered encounter medications on file as of 09/05/2023.    Allergies (verified) Patient has no known allergies.   History: Past Medical History:  Diagnosis Date   Acute meniscal tear of knee    RIGHT KNEE   Arthritis    Cancer (HCC)    skin   CAP  (community acquired pneumonia) 12/08/2021   GERD (gastroesophageal reflux disease) 11/19/2020   Hives    PER PT UNKNOWN WHY   Hyperlipidemia 12/08/2021   IBS (irritable bowel syndrome)    Mild sleep apnea    PER STUDY  11-30-2010--  NO CPAP   Neuromuscular disorder (HCC)    Spinal stenosis    Stroke (HCC)    unsure per pt   Past Surgical History:  Procedure Laterality Date   APPENDECTOMY     COLONOSCOPY     HEMORRHOID SURGERY     KNEE ARTHROSCOPY Bilateral    x4   KNEE ARTHROSCOPY WITH LATERAL MENISECTOMY Right 04/12/2012   Procedure: RIGHT KNEE ARTHROSCOPY WITH PARTIAL MEDIAL AND LATERAL MENISECTOMY;  Surgeon: [name], MD;  Location: [facility];  Service: Orthopedics;  Laterality: Right;   REVERSE SHOULDER ARTHROPLASTY Right 10/13/2022   Procedure: REVERSE SHOULDER ARTHROPLASTY;  Surgeon: [name], MD;  Location: [facility];  Service: Orthopedics;  Laterality: Right;  interscalene block flip room   SHOULDER ARTHROSCOPY WITH ROTATOR CUFF REPAIR AND SUBACROMIAL DECOMPRESSION  12-21-1999  &  01-28-2004   Family History  Problem Relation Age of Onset   Heart disease Mother    Heart disease Father    Social History   Socioeconomic History   Marital status: Divorced    Spouse name: Not on file   Number of children: Not on file   Years of education: Not on file   Highest education level: Not on file  Occupational History   Occupation: RETIRED/Writer  Tobacco Use   Smoking status: Former    Types: Cigarettes   Smokeless tobacco: Never   Tobacco comments:    QUIT SMOKING 40 YRS AGO  Vaping Use   Vaping status: Never Used  Substance and Sexual Activity   Alcohol  use: Yes    Comment: ONE BOTTLE WINE PER DAY   Drug use: No   Sexual activity: Not on file  Other Topics Concern   Not on file  Social History Narrative   Right handed   Lives alone/2025   Social Drivers of Health   Financial Resource Strain: Low Risk  (09/05/2023)   Overall  Financial Resource Strain (CARDIA)    Difficulty of Paying Living Expenses: Not hard at all  Food Insecurity: No Food Insecurity (09/05/2023)   Hunger Vital Sign    Worried About Running Out of Food in the Last Year: Never true    Ran Out of Food in the Last Year: Never true  Transportation Needs: No Transportation Needs (09/05/2023)   PRAPARE - Administrator, Civil Service (Medical): No    Lack of Transportation (Non-Medical): No  Physical Activity: Sufficiently Active (09/05/2023)   Exercise Vital Sign    Days of Exercise per Week: 6 days    Minutes of Exercise per Session: 60 min  Stress: No Stress Concern Present (  09/05/2023)   [employer] [employer] - Occupational Stress Questionnaire    Feeling of Stress: Only a little  Social Connections: Socially Isolated (09/05/2023)   Social Connection and Isolation Panel    Frequency of Communication with Friends and Family: More than three times a week    Frequency of Social Gatherings with Friends and Family: More than three times a week    Attends Religious Services: Never    Database administrator or Organizations: No    Attends Engineer, structural: Never    Marital Status: Divorced    Tobacco Counseling Counseling given: Not Answered Tobacco comments: QUIT SMOKING 40 YRS AGO    Clinical Intake:  Pre-visit preparation completed: Yes  Pain : No/denies pain     BMI - recorded: 22.67 Nutritional Status: BMI of 19-24  Normal Nutritional Risks: None Diabetes: No  Lab Results  Component Value Date   HGBA1C 5.7 08/28/2023   HGBA1C 5.3 11/15/2021   HGBA1C 5.6 05/20/2020     How often do you need to have someone help you when you read instructions, pamphlets, or other written materials from your doctor or pharmacy?: 1 - Never  Interpreter Needed?: No  Information entered by :: Ashish Rossetti, RMA   Activities of Daily Living     09/05/2023    3:35 PM 10/13/2022    2:43 PM  In your  present state of health, do you have any difficulty performing the following activities:  Hearing? 1 1  Comment Deaf in rt ear-per pt   Vision? 0 0  Difficulty concentrating or making decisions? 0 0  Walking or climbing stairs? 0 0  Dressing or bathing? 0 0  Doing errands, shopping? 0 0  Preparing Food and eating ? N   Using the Toilet? N   In the past six months, have you accidently leaked urine? Y   Do you have problems with loss of bowel control? N   Managing your Medications? N   Managing your Finances? N   Housekeeping or managing your Housekeeping? N     Patient Care Team: [name], MD as PCP - General (Internal Medicine) Georjean Darice HERO, MD as Consulting Physician (Neurology)  I have updated your Care Teams any recent Medical Services you may have received from other providers in the past year.     Assessment:   This is a routine wellness examination for [facility].  Hearing/Vision screen Hearing Screening - Comments:: Deaf in rt ear-per pt Vision Screening - Comments:: Wears eyeglasses/   Goals Addressed   None    Depression Screen     09/05/2023    3:50 PM 07/02/2023    3:28 PM 08/21/2022    1:35 PM 05/26/2022    2:38 PM 02/17/2022    3:55 PM 12/19/2021    3:30 PM 09/19/2021    4:09 PM  PHQ 2/9 Scores  PHQ - 2 Score 0 0 0 0 0 6 4  PHQ- 9 Score 0 0  0 3 19 8     Fall Risk     09/05/2023    3:46 PM 08/28/2023    4:00 PM 07/02/2023    3:27 PM 06/11/2023    2:32 PM 11/06/2022    1:30 PM  Fall Risk   Falls in the past year? 0 1 1 0 1  Number falls in past yr: 0 0 1 0 0  Injury with Fall? 0 0 0 0 0  Follow up Falls evaluation completed;Falls  prevention discussed Falls evaluation completed Falls evaluation completed Falls evaluation completed Falls evaluation completed    MEDICARE RISK AT HOME:  Medicare Risk at Home Any stairs in or around the home?: Yes If so, are there any without handrails?: No Home free of loose throw rugs in walkways, pet beds,  electrical cords, etc?: Yes Adequate lighting in your home to reduce risk of falls?: Yes Life alert?: No Use of a cane, walker or w/c?: No Grab bars in the bathroom?: Yes Shower chair or bench in shower?: Yes Elevated toilet seat or a handicapped toilet?: Yes  TIMED UP AND GO:  Was the test performed?  No  Cognitive Function: Declined/Normal: No cognitive concerns noted by patient or family. Patient alert, oriented, able to answer questions appropriately and recall recent events. No signs of memory loss or confusion.      02/27/2020    1:00 PM  Montreal Cognitive Assessment   Visuospatial/ Executive (0/5) 4  Naming (0/3) 3  Attention: Read list of digits (0/2) 2  Attention: Read list of letters (0/1) 1  Attention: Serial 7 subtraction starting at 100 (0/3) 3  Language: Repeat phrase (0/2) 2  Language : Fluency (0/1) 0  Abstraction (0/2) 1  Delayed Recall (0/5) 1  Orientation (0/6) 6  Total 23  Adjusted Score (based on education) 23      Immunizations Immunization History  Administered Date(s) Administered   Fluad Quad(high Dose 65+) 10/24/2021   Influenza,inj,quad, With Preservative 10/14/2018   Tdap 08/29/2018    Screening Tests Health Maintenance  Topic Date Due   Pneumococcal Vaccine: 50+ Years (1 of 1 - PCV) Never done   COVID-19 Vaccine (1 - 2024-25 season) Never done   INFLUENZA VACCINE  08/24/2023   Medicare Annual Wellness (AWV)  09/04/2024   DTaP/Tdap/Td (2 - Td or Tdap) 08/28/2028   Hepatitis B Vaccines  Aged Out   HPV VACCINES  Aged Out   Meningococcal B Vaccine  Aged Out   Zoster Vaccines- Shingrix  Discontinued    Health Maintenance  Health Maintenance Due  Topic Date Due   Pneumococcal Vaccine: 50+ Years (1 of 1 - PCV) Never done   COVID-19 Vaccine (1 - 2024-25 season) Never done   INFLUENZA VACCINE  08/24/2023   Health Maintenance Items Addressed: See Nurse Notes at the end of this note  Additional Screening:  Vision Screening:  Recommended annual ophthalmology exams for early detection of glaucoma and other disorders of the eye. Would you like a referral to an eye doctor? No    Dental Screening: Recommended annual dental exams for proper oral hygiene  Community Resource Referral / Chronic Care Management: CRR required this visit?  No   CCM required this visit?  No   Plan:    I have personally reviewed and noted the following in the patient's chart:   Medical and social history Use of alcohol , tobacco or illicit drugs  Current medications and supplements including opioid prescriptions. Patient is not currently taking opioid prescriptions. Functional ability and status Nutritional status Physical activity Advanced directives List of other physicians Hospitalizations, surgeries, and ER visits in previous 12 months Vitals Screenings to include cognitive, depression, and falls Referrals and appointments  In addition, I have reviewed and discussed with patient certain preventive protocols, quality metrics, and best practice recommendations. A written personalized care plan for preventive services as well as general preventive health recommendations were provided to patient.   Taren Dymek L Moira Umholtz, CMA   09/05/2023   After Visit  Summary: (Mail) Due to this being a telephonic visit, the after visit summary with patients personalized plan was offered to patient via mail   Notes: Patient is due for a pneumonia vaccine.  He states that he has been trying to pick up his Rx Adderall XR 20 mg from his pharmacy, [facility], however they are saying that they need more information from the provider.  I informed patient that I would put in office note for provider to know.

## 2023-09-05 NOTE — Patient Instructions (Signed)
 Mr. Hilburn , Thank you for taking time out of your busy schedule to complete your Annual Wellness Visit with me. I enjoyed our conversation and look forward to speaking with you again next year. I, as well as your care team,  appreciate your ongoing commitment to your health goals. Please review the following plan we discussed and let me know if I can assist you in the future. Your Game plan/ To Do List    Follow up Visits: We will see or speak with you next year for your Next Medicare AWV with our clinical staff Have you seen your provider in the last 6 months (3 months if uncontrolled diabetes)? Yes.  Last OV on 08/28/2023.  Clinician Recommendations:  Aim for 30 minutes of exercise or brisk walking, 6-8 glasses of water , and 5 servings of fruits and vegetables each day. You are due for a pneumonia vaccine and can get that done at your local pharmacy and or during your next office visit here.        This is a list of the screenings recommended for you:  Health Maintenance  Topic Date Due   Pneumococcal Vaccine for age over 35 (1 of 1 - PCV) Never done   COVID-19 Vaccine (1 - 2024-25 season) Never done   Flu Shot  08/24/2023   Medicare Annual Wellness Visit  08/27/2024   DTaP/Tdap/Td vaccine (2 - Td or Tdap) 08/28/2028   Hepatitis B Vaccine  Aged Out   HPV Vaccine  Aged Out   Meningitis B Vaccine  Aged Out   Zoster (Shingles) Vaccine  Discontinued    Advanced directives: (Copy Requested) Please bring a copy of your health care power of attorney and living will to the office to be added to your chart at your convenience. You can mail to [facility] [address] [place], [place] [address] or email to [email or web address] Advance Care Planning is important because it:  [x]  Makes sure you receive the medical care that is consistent with your values, goals, and preferences  [x]  It provides guidance to your family and loved ones and reduces their decisional  burden about whether or not they are making the right decisions based on your wishes.  Follow the link provided in your after visit summary or read over the paperwork we have mailed to you to help you started getting your Advance Directives in place. If you need assistance in completing these, please reach out to us  so that we can help you!  See attachments for Preventive Care and Fall Prevention Tips.

## 2023-09-06 NOTE — Telephone Encounter (Signed)
 Copied from CRM [id number]. Topic: Clinical - Prescription Issue >> Sep 05, 2023  3:11 PM Deaijah H wrote: Reason for CRM: Patient called in stating pharmacy cannot fill amphetamine -dextroamphetamine (ADDERALL XR) 20 MG 24 hr capsule without getting more information from Dr. Rollene. Was unable to provide what information was needed.

## 2023-09-07 ENCOUNTER — Other Ambulatory Visit (HOSPITAL_COMMUNITY): Payer: Self-pay

## 2023-09-07 ENCOUNTER — Telehealth: Payer: Self-pay

## 2023-09-07 NOTE — Progress Notes (Signed)
 I have sent over to Prior authorzation team to start

## 2023-09-07 NOTE — Telephone Encounter (Signed)
 Encounter 8/11 Result Note

## 2023-09-07 NOTE — Progress Notes (Signed)
 Called pharmacy and they stated that they are needing a prior authorization needed. I am forwarding to the PA team to start

## 2023-09-07 NOTE — Progress Notes (Signed)
 They are have denied for both ER and IR adderall but insurance would like for him try the alternative Guanfacine  ER can we possible send this in to the patient

## 2023-09-07 NOTE — Telephone Encounter (Signed)
 Pharmacy Patient Advocate Encounter   Received notification from Physician's Office that prior authorization for Amphetamine -Dextroamphetamine ER 20mg  caps is required/requested.   Insurance verification completed.   The patient is insured through [facility] .   Per test claim: PA required and submitted KEY/EOC/Request #: [id number].  [facility] is not able to process this request because the previous Prior Authorization Appeal Request was Denied

## 2023-09-07 NOTE — Telephone Encounter (Signed)
 I have responded on another encounter there was an alternative that I ask Dr Rollene if we could send in to patient

## 2023-09-10 ENCOUNTER — Telehealth: Payer: Self-pay

## 2023-09-10 NOTE — Telephone Encounter (Signed)
 He has already tried the alternative listed in the 8/11 result note.

## 2023-09-10 NOTE — Telephone Encounter (Signed)
 Copied from CRM #[id number]. Topic: Clinical - Prescription Issue >> Sep 10, 2023  3:52 PM [place] wrote: Reason for CRM: patient has been calling asking about the medication for Adderal, he has not had it for 3 weeks, wanting to know if he can go back to methylphenidate  (RITALIN ) tablet 5-10 mg . Please reach out to patient as soon as possible

## 2023-09-10 NOTE — Telephone Encounter (Signed)
 OK to inform patient that he was denied and see if he is ok with paying out of pocket?

## 2023-09-10 NOTE — Telephone Encounter (Signed)
 Spoke with patient and we discussed about the the adderral and the alternative both have been denied. Patient is asking if he can just go back to taking the methylphenidate .

## 2023-09-11 NOTE — Telephone Encounter (Signed)
 I have spoke with the patient and verbalized to him that Dr Rollene has already spoke with him as to why she will be be filling the methylphenidate  and also that she strongly advise him to fill the adderral and he stated that he will pay the 80$ which he believe is the out of pocket cost for this medication.

## 2023-09-11 NOTE — Telephone Encounter (Signed)
 Called patient back and informed him that he will need to refill the adderall and he verbalized he understood and that he will pay the out of pocket price which he believe is 80$

## 2023-09-11 NOTE — Telephone Encounter (Signed)
 I have requested many times for patient to fill and take adderall. I will not prescribe methylphenidate  and have spoken at length to patient about this and why I will not.

## 2023-10-03 ENCOUNTER — Other Ambulatory Visit: Payer: Self-pay | Admitting: Internal Medicine

## 2023-10-16 ENCOUNTER — Ambulatory Visit: Admitting: Internal Medicine

## 2023-10-16 ENCOUNTER — Encounter: Payer: Self-pay | Admitting: Internal Medicine

## 2023-10-16 VITALS — BP 130/68 | HR 84 | Temp 97.8°F | Resp 20 | Ht 70.0 in | Wt 155.6 lb

## 2023-10-16 DIAGNOSIS — G9332 Myalgic encephalomyelitis/chronic fatigue syndrome: Secondary | ICD-10-CM

## 2023-10-16 DIAGNOSIS — L299 Pruritus, unspecified: Secondary | ICD-10-CM | POA: Diagnosis not present

## 2023-10-16 DIAGNOSIS — R350 Frequency of micturition: Secondary | ICD-10-CM

## 2023-10-16 DIAGNOSIS — Z23 Encounter for immunization: Secondary | ICD-10-CM

## 2023-10-16 MED ORDER — METHYLPHENIDATE HCL 10 MG PO TABS: 10.0000 mg | ORAL_TABLET | Freq: Three times a day (TID) | ORAL | 0 refills | Status: DC

## 2023-10-16 NOTE — Progress Notes (Signed)
 Subjective:   Patient ID: Jose [name], male    DOB: Dec 06, 1935, 88 y.o.   MRN: [id number]  Discussed the use of AI scribe software for clinical note transcription with the patient, who gave verbal consent to proceed.  History of Present Illness Jose [name] is an 88 year old male who presents with a request to resume Ritalin  for fatigue management.  He has been experiencing increased fatigue and weakness, noting a decrease in physical strength, muscle atrophy, and an increase in abdominal girth. Despite regular exercise, he has gained 12 pounds compared to last year, attributing previous weight loss to a hospital stay following shoulder replacement surgery. He has a history of using Ritalin  for forty years to manage fatigue, which he found beneficial. Recently, he tried a new medication that was ineffective and caused increased tiredness and depression, leading him to discontinue it after 18 days.  He experiences right ear pain when lying on that side, which he manages with a water  squirter and cortisone application.  He has a persistent dry cough, which he attributes to allergies, and experiences itching, particularly on his back. Despite seeing a dermatologist, treatments have been largely ineffective, and he uses cortisone cream for temporary relief.  He reports urinary urgency, which he attributes to age and prostate issues, and manages by carrying a large cup with him. He also notes general irregularity in bowel movements.  He has difficulty walking, which he attributes to a 'bad back' and a history of orthopedic issues.  Review of Systems  Constitutional:  Positive for fatigue.  HENT: Negative.    Eyes: Negative.   Respiratory:  Negative for cough, chest tightness and shortness of breath.   Cardiovascular:  Negative for chest pain, palpitations and leg swelling.  Gastrointestinal:  Negative for abdominal distention, abdominal pain, constipation, diarrhea,  nausea and vomiting.  Musculoskeletal: Negative.   Skin: Negative.   Neurological:  Positive for weakness.       Memory change  Psychiatric/Behavioral: Negative.      Objective:  Physical Exam Constitutional:      Appearance: He is well-developed.  HENT:     Head: Normocephalic and atraumatic.  Cardiovascular:     Rate and Rhythm: Normal rate and regular rhythm.  Pulmonary:     Effort: Pulmonary effort is normal. No respiratory distress.     Breath sounds: Normal breath sounds. No wheezing or rales.  Abdominal:     General: Bowel sounds are normal. There is no distension.     Palpations: Abdomen is soft.     Tenderness: There is no abdominal tenderness.  Musculoskeletal:     Cervical back: Normal range of motion.  Skin:    General: Skin is warm and dry.  Neurological:     Mental Status: He is alert and oriented to person, place, and time.     Coordination: Coordination normal.     Vitals:   10/16/23 1420  BP: 130/68  Pulse: 84  Resp: 20  Temp: 97.8 F (36.6 C)  SpO2: 97%  Weight: 155 lb 9.6 oz (70.6 kg)  Height: 5' 10 (1.778 m)   Flu shot given at visit  Assessment and Plan Assessment & Plan Chronic fatigue syndrome   Symptoms have worsened with the current medication. Methylphenidate  was effective for 40 years, and he prefers to return to it due to its efficacy. Discontinue the current medication. Due to hesitancy about adherenence in the setting of multiple controlled agents and alcohol  mixed with memory disturbance  I am no longer willing to prescribe the # of pills her was taking previously. Previously he was taking 5-10 mg TID and mostly was taking 10 mg TID. He pill counts were not correct. I will try to make this less complicated and am willing to prescribe methylphenidate  10 mg TID. Script for #90 done today no refills. He has been asked to stop alcohol  in the past and not been interested in this.   Generalized pruritus   Persistent pruritus is primarily on  the back, with previous treatments proving ineffective. Cortisone provides temporary relief.  Right ear pain   He experiences intermittent pain when lying on the affected side, with no visible abnormalities. Cortisone provides relief.  Chronic cough   The dry cough is likely due to seasonal allergies, with no productive cough or concerning symptoms.  Urinary urgency   Symptoms are likely due to age and prostate enlargement, and he is manageable with current strategies.

## 2023-10-16 NOTE — Patient Instructions (Signed)
 We have sent in the methylphenidate  to take 1 pill (10 mg) 3 times  a day.

## 2023-10-16 NOTE — Assessment & Plan Note (Signed)
 Symptoms are likely due to age and prostate enlargement, and he is manageable with current strategies.

## 2023-10-16 NOTE — Assessment & Plan Note (Signed)
 Persistent pruritus is primarily on the back, with previous treatments proving ineffective. Cortisone provides temporary relief.

## 2023-10-16 NOTE — Assessment & Plan Note (Signed)
 Symptoms have worsened with the current medication. Methylphenidate  was effective for 40 years, and he prefers to return to it due to its efficacy. Discontinue the current medication. Due to hesitancy about adherenence in the setting of multiple controlled agents and alcohol  mixed with memory disturbance I am no longer willing to prescribe the # of pills her was taking previously. Previously he was taking 5-10 mg TID and mostly was taking 10 mg TID. He pill counts were not correct. I will try to make this less complicated and am willing to prescribe methylphenidate  10 mg TID. Script for #90 done today no refills. He has been asked to stop alcohol  in the past and not been interested in this.

## 2023-12-26 ENCOUNTER — Emergency Department (HOSPITAL_COMMUNITY)

## 2023-12-26 ENCOUNTER — Encounter (HOSPITAL_COMMUNITY): Payer: Self-pay

## 2023-12-26 ENCOUNTER — Other Ambulatory Visit: Payer: Self-pay

## 2023-12-26 ENCOUNTER — Inpatient Hospital Stay (HOSPITAL_COMMUNITY)
Admission: EM | Admit: 2023-12-26 | Discharge: 2024-01-01 | DRG: 871 | Disposition: A | Discharge status: Skilled Nursing Facility | Attending: Internal Medicine | Admitting: Internal Medicine

## 2023-12-26 DIAGNOSIS — I480 Paroxysmal atrial fibrillation: Secondary | ICD-10-CM

## 2023-12-26 DIAGNOSIS — R748 Abnormal levels of other serum enzymes: Secondary | ICD-10-CM

## 2023-12-26 DIAGNOSIS — R6520 Severe sepsis without septic shock: Secondary | ICD-10-CM

## 2023-12-26 DIAGNOSIS — E785 Hyperlipidemia, unspecified: Secondary | ICD-10-CM | POA: Diagnosis present

## 2023-12-26 DIAGNOSIS — J189 Pneumonia, unspecified organism: Secondary | ICD-10-CM | POA: Diagnosis not present

## 2023-12-26 DIAGNOSIS — M6282 Rhabdomyolysis: Secondary | ICD-10-CM | POA: Diagnosis present

## 2023-12-26 DIAGNOSIS — G9341 Metabolic encephalopathy: Secondary | ICD-10-CM | POA: Diagnosis present

## 2023-12-26 DIAGNOSIS — F1010 Alcohol abuse, uncomplicated: Secondary | ICD-10-CM | POA: Diagnosis present

## 2023-12-26 DIAGNOSIS — E876 Hypokalemia: Secondary | ICD-10-CM | POA: Diagnosis present

## 2023-12-26 DIAGNOSIS — A419 Sepsis, unspecified organism: Secondary | ICD-10-CM | POA: Diagnosis not present

## 2023-12-26 DIAGNOSIS — G9332 Myalgic encephalomyelitis/chronic fatigue syndrome: Secondary | ICD-10-CM | POA: Diagnosis present

## 2023-12-26 LAB — COMPREHENSIVE METABOLIC PANEL WITH GFR
ALT: 24 U/L (ref 0–44)
AST: 57 U/L — ABNORMAL HIGH (ref 15–41)
Albumin: 3.2 g/dL — ABNORMAL LOW (ref 3.5–5.0)
Alkaline Phosphatase: 62 U/L (ref 38–126)
Anion gap: 10 (ref 5–15)
BUN: 13 mg/dL (ref 8–23)
CO2: 25 mmol/L (ref 22–32)
Calcium: 9 mg/dL (ref 8.9–10.3)
Chloride: 97 mmol/L — ABNORMAL LOW (ref 98–111)
Creatinine, Ser: 1.02 mg/dL (ref 0.61–1.24)
GFR, Estimated: >60 mL/min (ref >60)
Glucose, Bld: 121 mg/dL — ABNORMAL HIGH (ref 70–99)
Potassium: 3.4 mmol/L — ABNORMAL LOW (ref 3.5–5.1)
Sodium: 132 mmol/L — ABNORMAL LOW (ref 135–145)
Total Bilirubin: 0.8 mg/dL (ref 0.0–1.2)
Total Protein: 6.5 g/dL (ref 6.5–8.1)

## 2023-12-26 LAB — PROTIME-INR
INR: 1.1 (ref 0.8–1.2)
Prothrombin Time: 15.3 s — ABNORMAL HIGH (ref 11.4–15.2)

## 2023-12-26 LAB — CBC WITH DIFFERENTIAL/PLATELET
Abs Immature Granulocytes: 0.1 K/uL — ABNORMAL HIGH (ref 0.00–0.07)
Basophils Absolute: 0 K/uL (ref 0.0–0.1)
Basophils Relative: 0 %
Eosinophils Absolute: 0 K/uL (ref 0.0–0.5)
Eosinophils Relative: 0 %
HCT: 43.3 % (ref 39.0–52.0)
Hemoglobin: 14.9 g/dL (ref 13.0–17.0)
Immature Granulocytes: 1 %
Lymphocytes Relative: 3 %
Lymphs Abs: 0.4 K/uL — ABNORMAL LOW (ref 0.7–4.0)
MCH: 30.1 pg (ref 26.0–34.0)
MCHC: 34.4 g/dL (ref 30.0–36.0)
MCV: 87.5 fL (ref 80.0–100.0)
Monocytes Absolute: 1.2 K/uL — ABNORMAL HIGH (ref 0.1–1.0)
Monocytes Relative: 10 %
Neutro Abs: 10.8 K/uL — ABNORMAL HIGH (ref 1.7–7.7)
Neutrophils Relative %: 86 %
Platelets: 155 K/uL (ref 150–400)
RBC: 4.95 MIL/uL (ref 4.22–5.81)
RDW: 14.3 % (ref 11.5–15.5)
WBC: 12.5 K/uL — ABNORMAL HIGH (ref 4.0–10.5)
nRBC: 0 % (ref 0.0–0.2)

## 2023-12-26 LAB — URINALYSIS, W/ REFLEX TO CULTURE (INFECTION SUSPECTED)
Bacteria, UA: NONE SEEN
Bilirubin Urine: NEGATIVE
Glucose, UA: NEGATIVE mg/dL
Ketones, ur: 5 mg/dL — AB
Leukocytes,Ua: NEGATIVE
Nitrite: NEGATIVE
Protein, ur: 30 mg/dL — AB
Specific Gravity, Urine: 1.039 — ABNORMAL HIGH (ref 1.005–1.030)
pH: 7 (ref 5.0–8.0)

## 2023-12-26 LAB — BLOOD GAS, VENOUS
Acid-Base Excess: 1.5 mmol/L (ref 0.0–2.0)
Bicarbonate: 25.9 mmol/L (ref 20.0–28.0)
O2 Saturation: 48.3 %
Patient temperature: 37
pCO2, Ven: 39 mmHg — ABNORMAL LOW (ref 44–60)
pH, Ven: 7.43 (ref 7.25–7.43)
pO2, Ven: <31 mmHg — CL (ref 32–45)

## 2023-12-26 LAB — MAGNESIUM: Magnesium: 1.4 mg/dL — ABNORMAL LOW (ref 1.7–2.4)

## 2023-12-26 LAB — I-STAT CG4 LACTIC ACID, ED: Lactic Acid, Venous: 1.9 mmol/L (ref 0.5–1.9)

## 2023-12-26 LAB — TSH: TSH: 1.815 u[IU]/mL (ref 0.350–4.500)

## 2023-12-26 LAB — RESP PANEL BY RT-PCR (RSV, FLU A&B, COVID)  RVPGX2
Influenza A by PCR: NEGATIVE
Influenza B by PCR: NEGATIVE
Resp Syncytial Virus by PCR: NEGATIVE
SARS Coronavirus 2 by RT PCR: NEGATIVE

## 2023-12-26 LAB — ETHANOL: Alcohol, Ethyl (B): <15 mg/dL (ref <15)

## 2023-12-26 LAB — PHOSPHORUS: Phosphorus: 3 mg/dL (ref 2.5–4.6)

## 2023-12-26 LAB — CK: Total CK: 680 U/L — ABNORMAL HIGH (ref 49–397)

## 2023-12-26 MED ORDER — THIAMINE MONONITRATE 100 MG PO TABS
100.0000 mg | ORAL_TABLET | Freq: Every day | ORAL | Status: DC
  Administered 2023-12-26 – 2024-01-01 (×7): 100 mg via ORAL
  Filled 2023-12-26 (×7): qty 1

## 2023-12-26 MED ORDER — LACTATED RINGERS IV SOLN: INTRAVENOUS | Status: AC

## 2023-12-26 MED ORDER — LORAZEPAM 2 MG/ML IJ SOLN: 1.0000 mg | INTRAMUSCULAR | Status: AC | PRN

## 2023-12-26 MED ORDER — IOHEXOL 350 MG/ML SOLN
75.0000 mL | Freq: Once | INTRAVENOUS | Status: AC | PRN
  Administered 2023-12-26: 75 mL via INTRAVENOUS

## 2023-12-26 MED ORDER — LACTATED RINGERS IV BOLUS (SEPSIS)
1000.0000 mL | Freq: Once | INTRAVENOUS | Status: AC
  Administered 2023-12-26: 1000 mL via INTRAVENOUS

## 2023-12-26 MED ORDER — SODIUM CHLORIDE 0.9 % IV SOLN
2.0000 g | Freq: Once | INTRAVENOUS | Status: AC
  Administered 2023-12-26: 2 g via INTRAVENOUS
  Filled 2023-12-26: qty 12.5

## 2023-12-26 MED ORDER — POTASSIUM CHLORIDE 10 MEQ/100ML IV SOLN
10.0000 meq | INTRAVENOUS | Status: AC
  Administered 2023-12-26 – 2023-12-27 (×2): 10 meq via INTRAVENOUS
  Filled 2023-12-26 (×2): qty 100

## 2023-12-26 MED ORDER — ADULT MULTIVITAMIN W/MINERALS CH
1.0000 | ORAL_TABLET | Freq: Every day | ORAL | Status: DC
  Administered 2023-12-26 – 2024-01-01 (×7): 1 via ORAL
  Filled 2023-12-26 (×7): qty 1

## 2023-12-26 MED ORDER — LORAZEPAM 1 MG PO TABS
1.0000 mg | ORAL_TABLET | ORAL | Status: AC | PRN
  Administered 2023-12-28 – 2023-12-29 (×2): 1 mg via ORAL
  Filled 2023-12-26 (×2): qty 1

## 2023-12-26 MED ORDER — THIAMINE HCL 100 MG/ML IJ SOLN
100.0000 mg | Freq: Every day | INTRAMUSCULAR | Status: DC
  Filled 2023-12-26 (×3): qty 2

## 2023-12-26 MED ORDER — FOLIC ACID 1 MG PO TABS
1.0000 mg | ORAL_TABLET | Freq: Every day | ORAL | Status: DC
  Administered 2023-12-26 – 2024-01-01 (×7): 1 mg via ORAL
  Filled 2023-12-26 (×7): qty 1

## 2023-12-26 MED ORDER — SODIUM CHLORIDE 0.9 % IV SOLN: 2.0000 g | INTRAVENOUS | Status: DC

## 2023-12-26 MED ORDER — METRONIDAZOLE 500 MG/100ML IV SOLN
500.0000 mg | Freq: Once | INTRAVENOUS | Status: AC
  Administered 2023-12-26: 500 mg via INTRAVENOUS
  Filled 2023-12-26: qty 100

## 2023-12-26 MED ORDER — SODIUM CHLORIDE 0.9 % IV SOLN: 500.0000 mg | INTRAVENOUS | Status: DC

## 2023-12-26 MED ORDER — ACETAMINOPHEN 325 MG PO TABS
650.0000 mg | ORAL_TABLET | Freq: Once | ORAL | Status: AC
  Administered 2023-12-26: 650 mg via ORAL
  Filled 2023-12-26: qty 2

## 2023-12-26 MED ORDER — LACTATED RINGERS IV BOLUS (SEPSIS)
250.0000 mL | Freq: Once | INTRAVENOUS | Status: AC
  Administered 2023-12-26: 250 mL via INTRAVENOUS

## 2023-12-26 MED ORDER — VANCOMYCIN HCL IN DEXTROSE 1-5 GM/200ML-% IV SOLN
1000.0000 mg | Freq: Once | INTRAVENOUS | Status: AC
  Administered 2023-12-26: 1000 mg via INTRAVENOUS
  Filled 2023-12-26: qty 200

## 2023-12-26 NOTE — Assessment & Plan Note (Signed)
 Order ciwa prtocol

## 2023-12-26 NOTE — ED Provider Notes (Signed)
 [place] EMERGENCY DEPARTMENT AT [facility] Provider Note   CSN: [id number] Arrival date & time: 12/26/23  1624     Patient presents with: Altered Mental Status   Jose [name] is a 88 y.o. male.   The history is provided by the patient and medical records. No language interpreter was used.  Altered Mental Status Presenting symptoms: confusion and partial responsiveness   Severity:  Moderate Most recent episode:  Today Episode history:  Unable to specify Duration:  1 day Timing:  Constant Progression:  Waxing and waning Chronicity:  New Context: head injury   Recent head injury:  Over 24 hours ago Associated symptoms: fever   Associated symptoms: no abdominal pain, no agitation, no headaches, no light-headedness, no nausea, no palpitations, no rash, no seizures and no vomiting        Prior to Admission medications   Medication Sig Start Date End Date Taking? Authorizing Provider  ALPRAZolam  (XANAX ) 0.5 MG tablet TAKE 1 TABLET BY MOUTH THREE TIMES DAILY AS NEEDED FOR ANXIETY 10/04/23   [name], MD  caffeine  200 MG TABS tablet Take 200 mg by mouth daily as needed (Stay awake).    [provider]  celecoxib  (CELEBREX ) 200 MG capsule Take 1 capsule (200 mg total) by mouth daily. 11/06/22   [name], MD  DULoxetine  (CYMBALTA ) 30 MG capsule Take 1 capsule (30 mg total) by mouth daily. 11/06/22   [name], MD  HYDROCORTISONE  EX Apply 1 Application topically daily at 12 noon. 10    [provider]  methylphenidate  (RITALIN ) 10 MG tablet Take 1 tablet (10 mg total) by mouth 3 (three) times daily with meals. 10/16/23   [name], MD  Multiple Vitamin (MULTIVITAMIN WITH MINERALS) TABS tablet Take 1 tablet by mouth daily. 11/28/21   Setzer, Nena PARAS, PA-C  OVER THE COUNTER MEDICATION Take 2.5 mLs by mouth daily as needed (Stomach). Baking Soda    [provider]  Oxymetazoline  HCl (NASAL  SPRAY NA) Place 1 spray into the nose every other day.    [provider]  triamcinolone  cream (KENALOG ) 0.1 % Apply 1 Application topically 2 (two) times daily. 07/02/23   [name], MD    Allergies: Patient has no known allergies.    Review of Systems  Constitutional:  Positive for chills, fatigue and fever.  HENT:  Negative for congestion.   Eyes:  Negative for visual disturbance.  Respiratory:  Positive for cough. Negative for chest tightness and shortness of breath.   Cardiovascular:  Negative for chest pain, palpitations and leg swelling.  Gastrointestinal:  Negative for abdominal pain, constipation, diarrhea, nausea and vomiting.  Genitourinary:  Negative for dysuria, flank pain and frequency.  Musculoskeletal:  Positive for neck pain. Negative for back pain and neck stiffness.  Skin:  Positive for color change and wound. Negative for rash.  Neurological:  Negative for seizures, light-headedness, numbness and headaches.  Psychiatric/Behavioral:  Positive for confusion. Negative for agitation.   All other systems reviewed and are negative.   Updated Vital Signs BP (!) 144/86 (BP Location: Right Arm)   Pulse (!) 140   Temp 98.3 F (36.8 C) (Oral)   Resp (!) 23   Ht 5' 10 (1.778 m)   Wt 70.3 kg   SpO2 96%   BMI 22.24 kg/m   Physical Exam Vitals and nursing note reviewed.  Constitutional:      General: He is not in acute distress.  Appearance: He is well-developed. He is ill-appearing. He is not toxic-appearing or diaphoretic.  HENT:     Head: Abrasion and contusion present. No laceration.     Comments: Impressive bruising to left cheek and around the left orbit.  Extraocular movements did seem to be intact on exam with effort.    Nose: No congestion or rhinorrhea.     Mouth/Throat:     Pharynx: No oropharyngeal exudate or posterior oropharyngeal erythema.  Eyes:     Conjunctiva/sclera: Conjunctivae normal.     Pupils: Pupils are equal, round,  and reactive to light.  Cardiovascular:     Rate and Rhythm: Regular rhythm. Tachycardia present.     Heart sounds: No murmur heard. Pulmonary:     Effort: No respiratory distress.     Breath sounds: Rhonchi present. No wheezing or rales.  Chest:     Chest wall: No tenderness.  Abdominal:     General: Abdomen is flat.     Palpations: Abdomen is soft.     Tenderness: There is no abdominal tenderness. There is no guarding or rebound.  Musculoskeletal:        General: Tenderness and signs of injury present. No swelling.     Left forearm: Tenderness present.       Arms:     Cervical back: Neck supple. Tenderness present.     Right hip: Tenderness present.     Right upper leg: Tenderness present.       Legs:     Comments: Small skin tear with tenderness on left forearm but intact sensation strength and pulse in arms and legs  Skin:    General: Skin is warm and dry.     Capillary Refill: Capillary refill takes less than 2 seconds.     Findings: Bruising present. No rash.  Neurological:     General: No focal deficit present.     Mental Status: He is alert.     Sensory: No sensory deficit.     Motor: No weakness.     (all labs ordered are listed, but only abnormal results are displayed) Labs Reviewed  COMPREHENSIVE METABOLIC PANEL WITH GFR - Abnormal; Notable for the following components:      Result Value   Sodium 132 (*)    Potassium 3.4 (*)    Chloride 97 (*)    Glucose, Bld 121 (*)    Albumin 3.2 (*)    AST 57 (*)    All other components within normal limits  CBC WITH DIFFERENTIAL/PLATELET - Abnormal; Notable for the following components:   WBC 12.5 (*)    Neutro Abs 10.8 (*)    Lymphs Abs 0.4 (*)    Monocytes Absolute 1.2 (*)    Abs Immature Granulocytes 0.10 (*)    All other components within normal limits  PROTIME-INR - Abnormal; Notable for the following components:   Prothrombin Time 15.3 (*)    All other components within normal limits  URINALYSIS, W/ REFLEX  TO CULTURE (INFECTION SUSPECTED) - Abnormal; Notable for the following components:   Specific Gravity, Urine 1.039 (*)    Hgb urine dipstick MODERATE (*)    Ketones, ur 5 (*)    Protein, ur 30 (*)    All other components within normal limits  CK - Abnormal; Notable for the following components:   Total CK 680 (*)    All other components within normal limits  RESP PANEL BY RT-PCR (RSV, FLU A&B, COVID)  RVPGX2  CULTURE, BLOOD (ROUTINE X  2)  CULTURE, BLOOD (ROUTINE X 2)  TSH  LEGIONELLA PNEUMOPHILA SEROGP 1 UR AG  STREP PNEUMONIAE URINARY ANTIGEN  PROCALCITONIN  CREATININE, URINE, RANDOM  OSMOLALITY  OSMOLALITY, URINE  MAGNESIUM   PHOSPHORUS  PREALBUMIN  SODIUM, URINE, RANDOM  CK  BLOOD GAS, VENOUS  AMMONIA  ETHANOL  RAPID URINE DRUG SCREEN, HOSP PERFORMED  I-STAT CG4 LACTIC ACID, ED    EKG: EKG Interpretation Date/Time:  Wednesday December 26 2023 16:43:27 EST Ventricular Rate:  139 PR Interval:  34 QRS Duration:  87 QT Interval:  283 QTC Calculation: 431 R Axis:   -67  Text Interpretation: Sinus tachycardia Atrial premature complex Probable left atrial enlargement Abnormal R-wave progression, late transition Probable inferior infarct, old when compared to prior, faster rate and more artifact No STEMI Confirmed by Ginger Barefoot ([id number]) on 12/26/2023 5:02:39 PM  Radiology: CT CHEST ABDOMEN PELVIS W CONTRAST Result Date: 12/26/2023 CLINICAL DATA:  Trauma. EXAM: CT CHEST, ABDOMEN, AND PELVIS WITH CONTRAST TECHNIQUE: Multidetector CT imaging of the chest, abdomen and pelvis was performed following the standard protocol during bolus administration of intravenous contrast. RADIATION DOSE REDUCTION: This exam was performed according to the departmental dose-optimization program which includes automated exposure control, adjustment of the mA and/or kV according to patient size and/or use of iterative reconstruction technique. CONTRAST:  75mL OMNIPAQUE  IOHEXOL  350 MG/ML SOLN  COMPARISON:  CT chest abdomen and pelvis 12/08/2021. FINDINGS: CT CHEST FINDINGS Cardiovascular: Heart is mildly enlarged. There is no pericardial effusion. Aorta is normal in size. There are atherosclerotic calcifications of the aorta and coronary arteries. Mediastinum/Nodes: No enlarged mediastinal, hilar, or axillary lymph nodes. Thyroid  gland, trachea, and esophagus demonstrate no significant findings. Lungs/Pleura: There is dependent atelectasis in the bilateral lower lobes. There are patchy ground-glass opacities in the right upper lobe, likely infectious/inflammatory. There is no pleural effusion or pneumothorax. Musculoskeletal: No acute fractures are seen. No focal body wall hematoma. CT ABDOMEN PELVIS FINDINGS Hepatobiliary: No hepatic injury or perihepatic hematoma. Gallbladder is unremarkable. Pancreas: Unremarkable. No pancreatic ductal dilatation or surrounding inflammatory changes. Spleen: No splenic injury or perisplenic hematoma. Adrenals/Urinary Tract: There is a 16 mm cyst in the left kidney. Otherwise, the kidneys, adrenal glands and bladder are within normal limits. Stomach/Bowel: Stomach is within normal limits. No evidence of bowel wall thickening, distention, or inflammatory changes. There is diffuse colonic diverticulosis. The appendix is not seen. Vascular/Lymphatic: The prostate is mildly enlarged. Reproductive: Uterus and bilateral adnexa are unremarkable. Other: No abdominal wall hernia or abnormality. No abdominopelvic ascites. Musculoskeletal: No acute fractures are seen. IMPRESSION: 1. No acute posttraumatic sequelae in the chest, abdomen or pelvis. 2. Patchy ground-glass opacities in the right upper lobe, likely infectious/inflammatory. 3. Colonic diverticulosis. 4. Mild cardiomegaly. 5. Aortic atherosclerosis. Aortic Atherosclerosis (ICD10-I70.0). Electronically Signed   By: Greig Pique M.D.   On: 12/26/2023 20:14   CT Maxillofacial Wo Contrast Result Date: 12/26/2023 CLINICAL  DATA:  Status post fall with altered mental status. EXAM: CT MAXILLOFACIAL W/O CM TECHNIQUE: Multidetector CT imaging of the maxillofacial structures was performed. Multiplanar CT image reconstructions were also generated. RADIATION DOSE REDUCTION: This exam was performed according to the departmental dose-optimization program which includes automated exposure control, adjustment of the mA and/or kV according to patient size and/or use of iterative reconstruction technique. COMPARISON:  None Available. FINDINGS: Osseous: No fracture or mandibular dislocation. No destructive process. Orbits: Negative. No traumatic or inflammatory finding. Sinuses: Clear. Soft tissues: There is mild left supra orbital soft tissue swelling. Limited intracranial: No significant or  unexpected finding. IMPRESSION: Mild left supra orbital soft tissue swelling without evidence of an acute fracture or dislocation. Electronically Signed   By: Suzen Dials M.D.   On: 12/26/2023 19:59   CT Head Wo Contrast Result Date: 12/26/2023 CLINICAL DATA:  Possible fall with altered mental status. EXAM: CT HEAD WITHOUT CONTRAST TECHNIQUE: Contiguous axial images were obtained from the base of the skull through the vertex without intravenous contrast. RADIATION DOSE REDUCTION: This exam was performed according to the departmental dose-optimization program which includes automated exposure control, adjustment of the mA and/or kV according to patient size and/or use of iterative reconstruction technique. COMPARISON:  None Available. FINDINGS: Brain: There is generalized cerebral atrophy with widening of the extra-axial spaces and ventricular dilatation. There are areas of decreased attenuation within the white matter tracts of the supratentorial brain, consistent with microvascular disease changes. Vascular: Marked severity bilateral cavernous carotid artery calcification is noted. Skull: Normal. Negative for fracture or focal lesion. Sinuses/Orbits:  No acute finding. Other: Mild left supra orbital soft tissue swelling is seen. IMPRESSION: 1. Generalized cerebral atrophy and microvascular disease changes of the supratentorial brain. 2. No acute intracranial abnormality. 3. Mild left supra orbital soft tissue swelling. Electronically Signed   By: Suzen Dials M.D.   On: 12/26/2023 19:58   CT Cervical Spine Wo Contrast Result Date: 12/26/2023 CLINICAL DATA:  Possible fall with altered mental status. EXAM: CT CERVICAL SPINE WITHOUT CONTRAST TECHNIQUE: Multidetector CT imaging of the cervical spine was performed without intravenous contrast. Multiplanar CT image reconstructions were also generated. RADIATION DOSE REDUCTION: This exam was performed according to the departmental dose-optimization program which includes automated exposure control, adjustment of the mA and/or kV according to patient size and/or use of iterative reconstruction technique. COMPARISON:  November 25, 2018 FINDINGS: Alignment: There is reversal of the normal cervical spine lordosis. 2 mm retrolisthesis of the C4 vertebral body is noted on C5. Skull base and vertebrae: No acute fracture. No primary bone lesion or focal pathologic process. Soft tissues and spinal canal: No prevertebral fluid or swelling. No visible canal hematoma. Disc levels: Marked severity endplate sclerosis, anterior osteophyte formation and posterior bony spurring are seen throughout all levels of the cervical spine. Marked severity multilevel intervertebral disc space narrowing is also present throughout the cervical spine. Bilateral marked severity multilevel facet joint hypertrophy is noted. Upper chest: There is mild biapical linear scarring and/or atelectasis. Other: None. IMPRESSION: 1. No acute cervical spine fracture. 2. Marked severity multilevel degenerative changes throughout the cervical spine. Electronically Signed   By: Suzen Dials M.D.   On: 12/26/2023 19:56   DG Chest Port 1 View Result  Date: 12/26/2023 CLINICAL DATA:  Questionable sepsis - evaluate for abnormality EXAM: DG CHEST 1V PORT COMPARISON:  December 08, 2021 FINDINGS: Lower lung volumes. Elevation of the right hemidiaphragm. No focal airspace consolidation, pleural effusion, or pneumothorax. No cardiomegaly.Tortuous aorta with aortic atherosclerosis.No acute fracture or destructive lesion. Multilevel thoracic osteophytosis. IMPRESSION: Low lung volumes.  Otherwise, no acute cardiopulmonary abnormality. Electronically Signed   By: Rogelia Myers M.D.   On: 12/26/2023 19:07   DG Femur Min 2 Views Right Result Date: 12/26/2023 CLINICAL DATA:  Questionable sepsis - evaluate for abnormality, fall EXAM: RIGHT FEMUR 2 VIEWS COMPARISON:  None Available. FINDINGS: No acute fracture or dislocation. Small knee joint effusion. There is no evidence of arthropathy or other focal bone abnormality. Peripheral vascular atherosclerosis. IMPRESSION: No acute fracture or dislocation. Electronically Signed   By: Rogelia Myers HERO.D.  On: 12/26/2023 19:06   DG Forearm Left Result Date: 12/26/2023 CLINICAL DATA:  fall EXAM: LEFT FOREARM - 2 VIEW COMPARISON:  None Available. FINDINGS: No acute fracture or dislocation. There is no evidence of arthropathy or other focal bone abnormality. Soft tissues are unremarkable. IMPRESSION: No acute fracture or dislocation. Electronically Signed   By: Rogelia Myers M.D.   On: 12/26/2023 19:05     Procedures   CRITICAL CARE Performed by: Jose [name] Total critical care time: 35 minutes Critical care time was exclusive of separately billable procedures and treating other patients. Critical care was necessary to treat or prevent imminent or life-threatening deterioration. Critical care was time spent personally by me on the following activities: development of treatment plan with patient and/or surrogate as well as nursing, discussions with consultants, evaluation of patient's response to  treatment, examination of patient, obtaining history from patient or surrogate, ordering and performing treatments and interventions, ordering and review of laboratory studies, ordering and review of radiographic studies, pulse oximetry and re-evaluation of patient's condition.   Medications Ordered in the ED  lactated ringers  infusion (has no administration in time range)  lactated ringers  bolus 1,000 mL (has no administration in time range)    And  lactated ringers  bolus 1,000 mL (has no administration in time range)    And  lactated ringers  bolus 250 mL (has no administration in time range)  ceFEPIme  (MAXIPIME ) 2 g in sodium chloride  0.9 % 100 mL IVPB (has no administration in time range)  metroNIDAZOLE  (FLAGYL ) IVPB 500 mg (has no administration in time range)  vancomycin  (VANCOCIN ) IVPB 1000 mg/200 mL premix (has no administration in time range)                                    Medical Decision Making Amount and/or Complexity of Data Reviewed Labs: ordered. Radiology: ordered.  Risk Prescription drug management. Decision regarding hospitalization.    Jose [name] is a 88 y.o. male with a past medical history significant for GERD, hyperlipidemia, irritable bowel syndrome, arthritis, previous stroke, previous appendectomy, and arthritis who presents for fatigue, altered mental status, cough, and recent fall.  According to patient and family report, he had a fall about a week ago that was not witnessed but he sustained injuries to his left face and head.  He still has some neck pain and some facial pain but was reportedly doing well and was getting around with his walker yesterday.  He is started eating better yesterday but today was found confused laying on the floor with a pillow and blankets and was not quite acting like his normal self.  Family said he was coughing today and was complaining of some pain in his right thigh and hip area.  He is not reporting other headache  or neck stiffness.  On arrival vital signs, patient found to be tachycardic in the 140s, tachypneic, and febrile on a rectal temperature of 104.3.  On my exam, lungs do have some coarseness.  He has tenderness in his right thigh and right hip area.  I did hear bowel sounds and I heard some rhonchi.  I did not hear a murmur initially.  He has some skin tear and bruising to his left forearm and had some tenderness there.  He did have symmetric strength in his arms and legs and had intact sensation.  Symmetric smile.  He has impressive bruising to his  left cheek and face that appears to be about a week old and he did seem to be able to move his eyes in all directions.  Pupils were symmetric and reactive.  He was somewhat somnolent but answering questions.  He denies constipation, diarrhea, nausea, vomiting, or urinary changes at this time.  Patient activated as a code sepsis and will get broad-spectrum antibiotics.  Given his cough and recent fall I am concerned he may have pneumonia causing some altered mental status and may have had injury in his head or face or possible rib fractures from the fall.  Will also get some portable x-rays and then CT imaging of his head, neck, face, and chest abdomen and pelvis.  Will both workup possible trauma and his sepsis and he will be admitted after workup is completed.  Patient CT imaging does indeed show evidence of pneumonia.  He does have a leukocytosis but lactic acid is reassuring.  Heart rate has improved with fluids and will give Tylenol  as his liver functions not critically elevated.  Urinalysis did not show UTI at this time.  CT imaging did not show significant traumatic injuries in the head neck or face.  Medicine will admit for further management of sepsis and pneumonia which I suspect may have contributed to his fatigue and altered mental status.      Final diagnoses:  Sepsis, due to unspecified organism, unspecified whether acute organ dysfunction  present (HCC)  Pneumonia due to infectious organism, unspecified laterality, unspecified part of lung  Elevated CK    Clinical Impression: 1. Sepsis, due to unspecified organism, unspecified whether acute organ dysfunction present (HCC)   2. Pneumonia due to infectious organism, unspecified laterality, unspecified part of lung   3. Elevated CK     Disposition: Admit  This note was prepared with assistance of Dragon voice recognition software. Occasional wrong-word or sound-a-like substitutions may have occurred due to the inherent limitations of voice recognition software.     Tashaya Ancrum, Jose PARAS, MD 12/26/23 2108

## 2023-12-26 NOTE — Assessment & Plan Note (Signed)
-   will replace electrolytes and repeat  check Mg, phos and Ca level and replace as needed Monitor on telemetry   Lab Results  Component Value Date   K 3.4 (L) 12/26/2023     Lab Results  Component Value Date   CREATININE 1.02 12/26/2023   Lab Results  Component Value Date   MG 1.9 11/17/2021   Lab Results  Component Value Date   CALCIUM  9.0 12/26/2023   PHOS 3.0 11/16/2021

## 2023-12-26 NOTE — Assessment & Plan Note (Signed)
 Rehydrate and recheck CK

## 2023-12-26 NOTE — Assessment & Plan Note (Signed)
 Not on statin

## 2023-12-26 NOTE — H&P (Signed)
 Jose [name] FMW:[id number] DOB: [birth year] DOA: 12/26/2023     PCP: [name], MD     Patient arrived to ER on 12/26/23 at 1624 Referred by Attending Tegeler, Lonni PARAS, *   Patient coming from:    home Lives  With  SO      Chief Complaint:   Chief Complaint  Patient presents with   Altered Mental Status    HPI: Jose [name] is a 88 y.o. male with medical history significant of   chronic fatigue, GERD, HLD, irritable bowel disease, mild sleep apnea, spinal stenosis, history of stroke    Presented with   chills,confusion Patient had patient had a fall about a week ago was found today by this family on the floor a bit confused wrapped in blankets febrile up to 104.3 in ER When he fell about a week ago he hurt his face Since then he was seen walking around his walker until found today He was noted to have some cough No neck stiffness no headaches    Son lives in [place]   He has been drinking less but still drinks some every other day a few sip. A bottle lasts him about 2 weeks He has hx of DT few years ago  But family feels he has not been drinking for the past week HE has been taking his xanax     Some  significant ETOH intake   Does not smoke   Denies marijuana use      Regarding pertinent Chronic problems:      Hyperlipidemia - not  on statins   Lipid Panel     Component Value Date/Time   CHOL 204 (H) 08/28/2023 1638   TRIG 248.0 (H) 08/28/2023 1638   HDL 72.60 08/28/2023 1638   CHOLHDL 3 08/28/2023 1638   VLDL 49.6 (H) 08/28/2023 1638   LDLCALC 82 08/28/2023 1638   LDLDIRECT 122.0 07/18/2021 1337   Chronic fatigue syndrome on Ritalin       - last echo  Recent Results (from the past 56199 hours)  ECHOCARDIOGRAM COMPLETE   Collection Time: 11/15/21  8:43 AM  Result Value   Weight 2,511.66   Height 64   BP 139/79   Single Plane A2C EF 60.0   Single Plane A4C EF 60.3   Calc EF 57.9   S' Lateral 2.30    Narrative      ECHOCARDIOGRAM REPORT         1. Left ventricular ejection fraction, by estimation, is 65 to 70%. The left ventricle has normal function. The left ventricle has no regional wall motion abnormalities. Indeterminate diastolic filling due to E-A fusion.  2. Right ventricular systolic function is normal. The right ventricular size is normal. There is normal pulmonary artery systolic pressure. The estimated right ventricular systolic pressure is 24.3 mmHg.  3. The mitral valve is grossly normal. Trivial mitral valve regurgitation. No evidence of mitral stenosis.  4. The aortic valve is tricuspid. There is mild calcification of the aortic valve. There is mild thickening of the aortic valve. Aortic valve regurgitation is not visualized. Aortic valve sclerosis is present, with no evidence of aortic valve stenosis.  5. The inferior vena cava is normal in size with greater than 50% respiratory variability, suggesting right atrial pressure of 3 mmHg.                 OSA - noncompliant with CPAP    Hx of CVA -  with/out residual  deficits   While in ER:         Lab Orders         Culture, blood (Routine x 2)         Resp panel by RT-PCR (RSV, Flu A&B, Covid) Anterior Nasal Swab         Comprehensive metabolic panel         CBC with Differential         Protime-INR         Urinalysis, w/ Reflex to Culture (Infection Suspected) -Urine, Clean Catch         CK         TSH      CT HEAD/neck   NON acute   CT face Mild left supra orbital soft tissue swelling without evidence of an acute fracture or dislocation.  CXR - Low lung volumes.   Right femur - non acute  Left forearm - no frx   CTabd/pelvis chest - Patchy ground-glass opacities in the right upper lobe, likely infectious/inflammatory.  Mild cardiomegaly.  Following Medications were ordered in ER: Medications  lactated ringers  infusion ( Intravenous New Bag/Given 12/26/23 2026)  lactated ringers  bolus 1,000 mL (0  mLs Intravenous Stopped 12/26/23 1853)    And  lactated ringers  bolus 1,000 mL (0 mLs Intravenous Stopped 12/26/23 1956)    And  lactated ringers  bolus 250 mL (0 mLs Intravenous Stopped 12/26/23 2026)  ceFEPIme  (MAXIPIME ) 2 g in sodium chloride  0.9 % 100 mL IVPB (0 g Intravenous Stopped 12/26/23 1818)  metroNIDAZOLE  (FLAGYL ) IVPB 500 mg (0 mg Intravenous Stopped 12/26/23 1956)  vancomycin  (VANCOCIN ) IVPB 1000 mg/200 mL premix (1,000 mg Intravenous New Bag/Given 12/26/23 1925)  iohexol  (OMNIPAQUE ) 350 MG/ML injection 75 mL (75 mLs Intravenous Contrast Given 12/26/23 1845)    ____     ED Triage Vitals  Encounter Vitals Group     BP 12/26/23 1634 (!) 144/86     Girls Systolic BP Percentile --      Girls Diastolic BP Percentile --      Boys Systolic BP Percentile --      Boys Diastolic BP Percentile --      Pulse Rate 12/26/23 1634 (!) 140     Resp 12/26/23 1634 (!) 23     Temp 12/26/23 1634 98.3 F (36.8 C)     Temp Source 12/26/23 1634 Oral     SpO2 12/26/23 1634 96 %     Weight 12/26/23 1635 155 lb (70.3 kg)     Height 12/26/23 1635 5' 10 (1.778 m)     Head Circumference --      Peak Flow --      Pain Score 12/26/23 1635 0     Pain Loc --      Pain Education --      Exclude from Growth Chart --   UFJK(75)@     _________________________________________ Significant initial  Findings: Abnormal Labs Reviewed  COMPREHENSIVE METABOLIC PANEL WITH GFR - Abnormal; Notable for the following components:      Result Value   Sodium 132 (*)    Potassium 3.4 (*)    Chloride 97 (*)    Glucose, Bld 121 (*)    Albumin 3.2 (*)    AST 57 (*)    All other components within normal limits  CBC WITH DIFFERENTIAL/PLATELET - Abnormal; Notable for the following components:   WBC 12.5 (*)    Neutro Abs 10.8 (*)    Lymphs Abs 0.4 (*)  Monocytes Absolute 1.2 (*)    Abs Immature Granulocytes 0.10 (*)    All other components within normal limits  PROTIME-INR - Abnormal; Notable for the following  components:   Prothrombin Time 15.3 (*)    All other components within normal limits  URINALYSIS, W/ REFLEX TO CULTURE (INFECTION SUSPECTED) - Abnormal; Notable for the following components:   Specific Gravity, Urine 1.039 (*)    Hgb urine dipstick MODERATE (*)    Ketones, ur 5 (*)    Protein, ur 30 (*)    All other components within normal limits  CK - Abnormal; Notable for the following components:   Total CK 680 (*)    All other components within normal limits        Cardiac Panel (last 3 results) Recent Labs    12/26/23 1637  CKTOTAL 680*     ECG: Ordered Personally reviewed and interpreted by me showing: HR : 139 Rhythm:Sinus tachycardia Atrial premature complex Probable left atrial enlargement Abnormal R-wave progression, late transition Probable inferior infarct, old QTC 431   This patient meets SIRS Criteria and may be septic.    The recent clinical data is shown below. Vitals:   12/26/23 1815 12/26/23 1900 12/26/23 1915 12/26/23 2015  BP: 124/74 (!) 133/91 131/84 123/84  Pulse: (!) 129 (!) 129 (!) 130 (!) 124  Resp: (!) 24 (!) 33 17 (!) 28  Temp:      TempSrc:      SpO2: 99% 99% 98% 98%  Weight:      Height:          WBC     Component Value Date/Time   WBC 12.5 (H) 12/26/2023 1637   LYMPHSABS 0.4 (L) 12/26/2023 1637   MONOABS 1.2 (H) 12/26/2023 1637   EOSABS 0.0 12/26/2023 1637   BASOSABS 0.0 12/26/2023 1637        Lactic Acid, Venous    Component Value Date/Time   LATICACIDVEN 1.9 12/26/2023 1700     Procalcitonin   Ordered      UA   no evidence of UTI      Urine analysis:    Component Value Date/Time   COLORURINE YELLOW 12/26/2023 1917   APPEARANCEUR CLEAR 12/26/2023 1917   LABSPEC 1.039 (H) 12/26/2023 1917   PHURINE 7.0 12/26/2023 1917   GLUCOSEU NEGATIVE 12/26/2023 1917   GLUCOSEU NEGATIVE 08/22/2021 1529   HGBUR MODERATE (A) 12/26/2023 1917   BILIRUBINUR NEGATIVE 12/26/2023 1917   KETONESUR 5 (A) 12/26/2023 1917    PROTEINUR 30 (A) 12/26/2023 1917   UROBILINOGEN 1.0 08/22/2021 1529   NITRITE NEGATIVE 12/26/2023 1917   LEUKOCYTESUR NEGATIVE 12/26/2023 1917    Results for orders placed or performed during the hospital encounter of 12/26/23  Resp panel by RT-PCR (RSV, Flu A&B, Covid) Anterior Nasal Swab     Status: None   Collection Time: 12/26/23  5:40 PM   Specimen: Anterior Nasal Swab  Result Value Ref Range Status   SARS Coronavirus 2 by RT PCR NEGATIVE NEGATIVE Final   Influenza A by PCR NEGATIVE NEGATIVE Final   Influenza B by PCR NEGATIVE NEGATIVE Final         Resp Syncytial Virus by PCR NEGATIVE NEGATIVE Final          ABX started Antibiotics Given (last 72 hours)     Date/Time Action Medication Dose Rate   12/26/23 1737 New Bag/Given   ceFEPIme  (MAXIPIME ) 2 g in sodium chloride  0.9 % 100 mL IVPB 2 g 200 mL/hr  12/26/23 1818 New Bag/Given   metroNIDAZOLE  (FLAGYL ) IVPB 500 mg 500 mg 100 mL/hr   12/26/23 1925 New Bag/Given   vancomycin  (VANCOCIN ) IVPB 1000 mg/200 mL premix 1,000 mg 200 mL/hr        No results found for the last 90 days.   __________________________________________________________ Recent Labs  Lab 12/26/23 1637  NA 132*  K 3.4*  CO2 25  GLUCOSE 121*  BUN 13  CREATININE 1.02  CALCIUM  9.0    Cr  Up from baseline see below Lab Results  Component Value Date   CREATININE 1.02 12/26/2023   CREATININE 0.88 08/28/2023   CREATININE 0.97 11/06/2022    Recent Labs  Lab 12/26/23 1637  AST 57*  ALT 24  ALKPHOS 62  BILITOT 0.8  PROT 6.5  ALBUMIN 3.2*   Lab Results  Component Value Date   CALCIUM  9.0 12/26/2023   PHOS 3.0 11/16/2021          Plt: Lab Results  Component Value Date   PLT 155 12/26/2023         Recent Labs  Lab 12/26/23 1637  WBC 12.5*  NEUTROABS 10.8*  HGB 14.9  HCT 43.3  MCV 87.5  PLT 155    HG/HCT  stable,      Component Value Date/Time   HGB 14.9 12/26/2023 1637   HCT 43.3 12/26/2023 1637   MCV 87.5  12/26/2023 1637        _______________________________________________ Hospitalist was called for admission for  sepsis ans CAP   The following Work up has been ordered so far:  Orders Placed This Encounter  Procedures   Culture, blood (Routine x 2)   Resp panel by RT-PCR (RSV, Flu A&B, Covid) Anterior Nasal Swab   DG Chest Port 1 View   DG Femur Min 2 Views Right   CT Head Wo Contrast   CT Cervical Spine Wo Contrast   CT Maxillofacial Wo Contrast   CT CHEST ABDOMEN PELVIS W CONTRAST   DG Forearm Left   Comprehensive metabolic panel   CBC with Differential   Protime-INR   Urinalysis, w/ Reflex to Culture (Infection Suspected) -Urine, Clean Catch   CK   TSH   Diet NPO time specified   Notify physician (specify)  Specify: Notify provider for possible Code Sepsis   Document height and weight   Assess and Document Glasgow Coma Scale   Document vital signs within 1-hour of fluid bolus completion. Notify provider of abnormal vital signs despite fluid resuscitation.   DO NOT delay antibiotics if unable to obtain blood culture.   Refer to Sidebar Report: Sepsis Sidebar ED/IP   Notify provider for difficulties obtaining IV access.   Insert peripheral IV x 2   Initiate Carrier Fluid Protocol   Consult to hospitalist   EKG 12-Lead   ED EKG     OTHER Significant initial  Findings:  labs showing:     DM  labs:  HbA1C: Recent Labs    08/28/23 1638  HGBA1C 5.7       CBG (last 3)  No results for input(s): GLUCAP in the last 72 hours.        Cultures:    Component Value Date/Time   SDES  12/08/2021 0500    IN/OUT CATH URINE Performed at [facility], [address] [place], [place] [address]    SPECREQUEST  12/08/2021 0500    Normal Performed at [facility], [address] [place], [place] [address]  CULT  12/08/2021 0500    NO GROWTH Performed at [facility], [address], [place], [place] [address]     REPTSTATUS 12/09/2021 FINAL 12/08/2021 0500     Radiological Exams on Admission: CT CHEST ABDOMEN PELVIS W CONTRAST Result Date: 12/26/2023 CLINICAL DATA:  Trauma. EXAM: CT CHEST, ABDOMEN, AND PELVIS WITH CONTRAST TECHNIQUE: Multidetector CT imaging of the chest, abdomen and pelvis was performed following the standard protocol during bolus administration of intravenous contrast. RADIATION DOSE REDUCTION: This exam was performed according to the departmental dose-optimization program which includes automated exposure control, adjustment of the mA and/or kV according to patient size and/or use of iterative reconstruction technique. CONTRAST:  75mL OMNIPAQUE  IOHEXOL  350 MG/ML SOLN COMPARISON:  CT chest abdomen and pelvis 12/08/2021. FINDINGS: CT CHEST FINDINGS Cardiovascular: Heart is mildly enlarged. There is no pericardial effusion. Aorta is normal in size. There are atherosclerotic calcifications of the aorta and coronary arteries. Mediastinum/Nodes: No enlarged mediastinal, hilar, or axillary lymph nodes. Thyroid  gland, trachea, and esophagus demonstrate no significant findings. Lungs/Pleura: There is dependent atelectasis in the bilateral lower lobes. There are patchy ground-glass opacities in the right upper lobe, likely infectious/inflammatory. There is no pleural effusion or pneumothorax. Musculoskeletal: No acute fractures are seen. No focal body wall hematoma. CT ABDOMEN PELVIS FINDINGS Hepatobiliary: No hepatic injury or perihepatic hematoma. Gallbladder is unremarkable. Pancreas: Unremarkable. No pancreatic ductal dilatation or surrounding inflammatory changes. Spleen: No splenic injury or perisplenic hematoma. Adrenals/Urinary Tract: There is a 16 mm cyst in the left kidney. Otherwise, the kidneys, adrenal glands and bladder are within normal limits. Stomach/Bowel: Stomach is within normal limits. No evidence of bowel wall thickening, distention, or inflammatory changes. There is diffuse colonic  diverticulosis. The appendix is not seen. Vascular/Lymphatic: The prostate is mildly enlarged. Reproductive: Uterus and bilateral adnexa are unremarkable. Other: No abdominal wall hernia or abnormality. No abdominopelvic ascites. Musculoskeletal: No acute fractures are seen. IMPRESSION: 1. No acute posttraumatic sequelae in the chest, abdomen or pelvis. 2. Patchy ground-glass opacities in the right upper lobe, likely infectious/inflammatory. 3. Colonic diverticulosis. 4. Mild cardiomegaly. 5. Aortic atherosclerosis. Aortic Atherosclerosis (ICD10-I70.0). Electronically Signed   By: Greig Pique M.D.   On: 12/26/2023 20:14   CT Maxillofacial Wo Contrast Result Date: 12/26/2023 CLINICAL DATA:  Status post fall with altered mental status. EXAM: CT MAXILLOFACIAL W/O CM TECHNIQUE: Multidetector CT imaging of the maxillofacial structures was performed. Multiplanar CT image reconstructions were also generated. RADIATION DOSE REDUCTION: This exam was performed according to the departmental dose-optimization program which includes automated exposure control, adjustment of the mA and/or kV according to patient size and/or use of iterative reconstruction technique. COMPARISON:  None Available. FINDINGS: Osseous: No fracture or mandibular dislocation. No destructive process. Orbits: Negative. No traumatic or inflammatory finding. Sinuses: Clear. Soft tissues: There is mild left supra orbital soft tissue swelling. Limited intracranial: No significant or unexpected finding. IMPRESSION: Mild left supra orbital soft tissue swelling without evidence of an acute fracture or dislocation. Electronically Signed   By: Suzen Dials M.D.   On: 12/26/2023 19:59   CT Head Wo Contrast Result Date: 12/26/2023 CLINICAL DATA:  Possible fall with altered mental status. EXAM: CT HEAD WITHOUT CONTRAST TECHNIQUE: Contiguous axial images were obtained from the base of the skull through the vertex without intravenous contrast. RADIATION  DOSE REDUCTION: This exam was performed according to the departmental dose-optimization program which includes automated exposure control, adjustment of the mA and/or kV according to patient size and/or use of iterative reconstruction technique.  COMPARISON:  None Available. FINDINGS: Brain: There is generalized cerebral atrophy with widening of the extra-axial spaces and ventricular dilatation. There are areas of decreased attenuation within the white matter tracts of the supratentorial brain, consistent with microvascular disease changes. Vascular: Marked severity bilateral cavernous carotid artery calcification is noted. Skull: Normal. Negative for fracture or focal lesion. Sinuses/Orbits: No acute finding. Other: Mild left supra orbital soft tissue swelling is seen. IMPRESSION: 1. Generalized cerebral atrophy and microvascular disease changes of the supratentorial brain. 2. No acute intracranial abnormality. 3. Mild left supra orbital soft tissue swelling. Electronically Signed   By: Suzen Dials M.D.   On: 12/26/2023 19:58   CT Cervical Spine Wo Contrast Result Date: 12/26/2023 CLINICAL DATA:  Possible fall with altered mental status. EXAM: CT CERVICAL SPINE WITHOUT CONTRAST TECHNIQUE: Multidetector CT imaging of the cervical spine was performed without intravenous contrast. Multiplanar CT image reconstructions were also generated. RADIATION DOSE REDUCTION: This exam was performed according to the departmental dose-optimization program which includes automated exposure control, adjustment of the mA and/or kV according to patient size and/or use of iterative reconstruction technique. COMPARISON:  November 25, 2018 FINDINGS: Alignment: There is reversal of the normal cervical spine lordosis. 2 mm retrolisthesis of the C4 vertebral body is noted on C5. Skull base and vertebrae: No acute fracture. No primary bone lesion or focal pathologic process. Soft tissues and spinal canal: No prevertebral fluid or  swelling. No visible canal hematoma. Disc levels: Marked severity endplate sclerosis, anterior osteophyte formation and posterior bony spurring are seen throughout all levels of the cervical spine. Marked severity multilevel intervertebral disc space narrowing is also present throughout the cervical spine. Bilateral marked severity multilevel facet joint hypertrophy is noted. Upper chest: There is mild biapical linear scarring and/or atelectasis. Other: None. IMPRESSION: 1. No acute cervical spine fracture. 2. Marked severity multilevel degenerative changes throughout the cervical spine. Electronically Signed   By: Suzen Dials M.D.   On: 12/26/2023 19:56   DG Chest Port 1 View Result Date: 12/26/2023 CLINICAL DATA:  Questionable sepsis - evaluate for abnormality EXAM: DG CHEST 1V PORT COMPARISON:  December 08, 2021 FINDINGS: Lower lung volumes. Elevation of the right hemidiaphragm. No focal airspace consolidation, pleural effusion, or pneumothorax. No cardiomegaly.Tortuous aorta with aortic atherosclerosis.No acute fracture or destructive lesion. Multilevel thoracic osteophytosis. IMPRESSION: Low lung volumes.  Otherwise, no acute cardiopulmonary abnormality. Electronically Signed   By: Rogelia Myers M.D.   On: 12/26/2023 19:07   DG Femur Min 2 Views Right Result Date: 12/26/2023 CLINICAL DATA:  Questionable sepsis - evaluate for abnormality, fall EXAM: RIGHT FEMUR 2 VIEWS COMPARISON:  None Available. FINDINGS: No acute fracture or dislocation. Small knee joint effusion. There is no evidence of arthropathy or other focal bone abnormality. Peripheral vascular atherosclerosis. IMPRESSION: No acute fracture or dislocation. Electronically Signed   By: Rogelia Myers M.D.   On: 12/26/2023 19:06   DG Forearm Left Result Date: 12/26/2023 CLINICAL DATA:  fall EXAM: LEFT FOREARM - 2 VIEW COMPARISON:  None Available. FINDINGS: No acute fracture or dislocation. There is no evidence of arthropathy or other  focal bone abnormality. Soft tissues are unremarkable. IMPRESSION: No acute fracture or dislocation. Electronically Signed   By: Rogelia Myers M.D.   On: 12/26/2023 19:05   _______________________________________________________________________________________________________ Latest  Blood pressure 123/84, pulse (!) 124, temperature (!) 104.3 F (40.2 C), temperature source Rectal, resp. rate (!) 28, height 5' 10 (1.778 m), weight 70.3 kg, SpO2 98%.   Vitals  labs and radiology  finding personally reviewed  Review of Systems:    Pertinent positives include:   Fevers, chills, non-productive cough, Constitutional:  No weight loss, night sweats, fatigue, weight loss  HEENT:  No headaches, Difficulty swallowing,Tooth/dental problems,Sore throat,  No sneezing, itching, ear ache, nasal congestion, post nasal drip,  Cardio-vascular:  No chest pain, Orthopnea, PND, anasarca, dizziness, palpitations.no Bilateral lower extremity swelling  GI:  No heartburn, indigestion, abdominal pain, nausea, vomiting, diarrhea, change in bowel habits, loss of appetite, melena, blood in stool, hematemesis Resp:  no shortness of breath at rest. No dyspnea on exertion, No excess mucus, no productive cough, No  No coughing up of blood.No change in color of mucus.No wheezing. Skin:  no rash or lesions. No jaundice GU:  no dysuria, change in color of urine, no urgency or frequency. No straining to urinate.  No flank pain.  Musculoskeletal:  No joint pain or no joint swelling. No decreased range of motion. No back pain.  Psych:  No change in mood or affect. No depression or anxiety. No memory loss.  Neuro: no localizing neurological complaints, no tingling, no weakness, no double vision, no gait abnormality, no slurred speech, no confusion  All systems reviewed and apart from HOPI all are negative _______________________________________________________________________________________________ Past Medical  History:   Past Medical History:  Diagnosis Date   Acute meniscal tear of knee    RIGHT KNEE   Arthritis    Cancer (HCC)    skin   CAP (community acquired pneumonia) 12/08/2021   GERD (gastroesophageal reflux disease) 11/19/2020   Hives    PER PT UNKNOWN WHY   Hyperlipidemia 12/08/2021   IBS (irritable bowel syndrome)    Mild sleep apnea    PER STUDY  11-30-2010--  NO CPAP   Neuromuscular disorder (HCC)    Spinal stenosis    Stroke (HCC)    unsure per pt      Past Surgical History:  Procedure Laterality Date   APPENDECTOMY     COLONOSCOPY     HEMORRHOID SURGERY     KNEE ARTHROSCOPY Bilateral    x4   KNEE ARTHROSCOPY WITH LATERAL MENISECTOMY Right 04/12/2012   Procedure: RIGHT KNEE ARTHROSCOPY WITH PARTIAL MEDIAL AND LATERAL MENISECTOMY;  Surgeon: [name], MD;  Location: [facility];  Service: Orthopedics;  Laterality: Right;   REVERSE SHOULDER ARTHROPLASTY Right 10/13/2022   Procedure: REVERSE SHOULDER ARTHROPLASTY;  Surgeon: [name], MD;  Location: [facility];  Service: Orthopedics;  Laterality: Right;  interscalene block flip room   SHOULDER ARTHROSCOPY WITH ROTATOR CUFF REPAIR AND SUBACROMIAL DECOMPRESSION  12-21-1999  &  01-28-2004    Social History:  Ambulatory   walker       reports that he has quit smoking. His smoking use included cigarettes. He has never used smokeless tobacco. He reports current alcohol  use. He reports that he does not use drugs.     Family History:   Family History  Problem Relation Age of Onset   Heart disease Mother    Heart disease Father    ______________________________________________________________________________________________ Allergies: No Known Allergies   Prior to Admission medications   Medication Sig Start Date End Date Taking? Authorizing Provider  ALPRAZolam  (XANAX ) 0.5 MG tablet TAKE 1 TABLET BY MOUTH THREE TIMES DAILY AS NEEDED FOR ANXIETY 10/04/23   [name], MD   caffeine  200 MG TABS tablet Take 200 mg by mouth daily as needed (Stay awake).    [provider]  celecoxib  (CELEBREX ) 200 MG capsule Take  1 capsule (200 mg total) by mouth daily. 11/06/22   [name], MD  DULoxetine  (CYMBALTA ) 30 MG capsule Take 1 capsule (30 mg total) by mouth daily. 11/06/22   [name], MD  HYDROCORTISONE  EX Apply 1 Application topically daily at 12 noon. 10    [provider]  methylphenidate  (RITALIN ) 10 MG tablet Take 1 tablet (10 mg total) by mouth 3 (three) times daily with meals. 10/16/23   [name], MD  Multiple Vitamin (MULTIVITAMIN WITH MINERALS) TABS tablet Take 1 tablet by mouth daily. 11/28/21   Setzer, Nena PARAS, PA-C  OVER THE COUNTER MEDICATION Take 2.5 mLs by mouth daily as needed (Stomach). Baking Soda    [provider]  Oxymetazoline  HCl (NASAL SPRAY NA) Place 1 spray into the nose every other day.    [provider]  triamcinolone  cream (KENALOG ) 0.1 % Apply 1 Application topically 2 (two) times daily. 07/02/23   [name], MD    ___________________________________________________________________________________________________ Physical Exam:    12/26/2023    8:15 PM 12/26/2023    7:15 PM 12/26/2023    7:00 PM  Vitals with BMI  Systolic 123 131 866  Diastolic 84 84 91  Pulse 124 130 129     1. General:  in No  Acute distress    Chronically ill   -appearing 2. Psychological: Alert and   Oriented to self not president or place 3. Head/ENT:   Dry Mucous Membranes                          Head Non traumatic, neck supple                        Poor Dentition 4. SKIN:  decreased Skin turgor,  Skin clean Dry and intact no rash    5. Heart: Regular rate and rhythm no  Murmur, no Rub or gallop 6. Lungs:  , no wheezes or crackles   7. Abdomen: Soft,  non-tender, Non distended  bowel sounds present 8. Lower extremities: no clubbing, cyanosis, no  edema 9. Neurologically  Grossly intact, moving all 4 extremities equally    10. MSK: Normal range of motion    Chart has been reviewed  ______________________________________________________________________________________________  Assessment/Plan 88 y.o. male with medical history significant of   chronic fatigue, GERD, HLD, irritable bowel disease, mild sleep apnea, spinal stenosis, history of stroke   Admitted for sepsis, CAP, rhabdomyolysis   Present on Admission:  CAP (community acquired pneumonia)  Acute metabolic encephalopathy  Hyperlipidemia  Chronic fatigue syndrome  Rhabdomyolysis  Sepsis (HCC)  ETOH abuse  Hypokalemia     Acute metabolic encephalopathy   - most likely multifactorial secondary to combination of  infection   mild dehydration secondary to decreased by mouth intake,  polypharmacy   - Will rehydrate   - treat underlining infection   - Hold contributing medications   - if no improvement may need further imaging to evaluate for CNS pathology pathology such as MRI of the brain   - neurological exam appears to be nonfocal but patient unable to cooperate fully   - VBG    -  ammonia     CAP (community acquired pneumonia)  - -Patient presenting with  productive cough, fever    , and infiltrate in   lower lobe on chest x-ray -Infiltrate on CXR and 2-3 characteristics (fever, leukocytosis, purulent sputum) are consistent with pneumonia. -  This appears to be most likely community-acquired pneumonia.       will admit for treatment of CAP will start on appropriate antibiotic coverage. - Rocephin /azithromycin    Obtain:  sputum cultures,                  Obtain respiratory panel                    influenza serologies negative                  COVID PCR negative                    blood cultures and sputum cultures ordered                   strep pneumo UA antigen,                  check for Legionella antigen.                Provide oxygen as needed.    Hyperlipidemia Not on  statin  Chronic fatigue syndrome Hold Ritalin   Rhabdomyolysis Rehydrate and recheck CK  Sepsis (HCC)  -SIRS criteria met with  elevated white blood cell count,       Component Value Date/Time   WBC 12.5 (H) 12/26/2023 1637   LYMPHSABS 0.4 (L) 12/26/2023 1637     tachycardia   ,   fever   RR >20 Today's Vitals   12/26/23 1815 12/26/23 1900 12/26/23 1915 12/26/23 2015  BP: 124/74 (!) 133/91 131/84 123/84  Pulse: (!) 129 (!) 129 (!) 130 (!) 124  Resp: (!) 24 (!) 33 17 (!) 28  Temp:      TempSrc:      SpO2: 99% 99% 98% 98%  Weight:      Height:      PainSc:          The recent clinical data is shown below. Vitals:   12/26/23 1815 12/26/23 1900 12/26/23 1915 12/26/23 2015  BP: 124/74 (!) 133/91 131/84 123/84  Pulse: (!) 129 (!) 129 (!) 130 (!) 124  Resp: (!) 24 (!) 33 17 (!) 28  Temp:      TempSrc:      SpO2: 99% 99% 98% 98%  Weight:      Height:           -Most likely source being: pulmonary,     Patient meeting criteria for Severe sepsis with    evidence of end organ damage/organ dysfunction such as    acute metabolic encephalopathy      - Obtain serial lactic acid and procalcitonin level.  - Initiated IV antibiotics in ER: Antibiotics Given (last 72 hours)     Date/Time Action Medication Dose Rate   12/26/23 1737 New Bag/Given   ceFEPIme  (MAXIPIME ) 2 g in sodium chloride  0.9 % 100 mL IVPB 2 g 200 mL/hr   12/26/23 1818 New Bag/Given   metroNIDAZOLE  (FLAGYL ) IVPB 500 mg 500 mg 100 mL/hr   12/26/23 1925 New Bag/Given   vancomycin  (VANCOCIN ) IVPB 1000 mg/200 mL premix 1,000 mg 200 mL/hr       Will continue  on : rocephin  azythro   - await results of blood and urine culture  - Rehydrate aggressively  Intravenous fluids were administered,         30cc/kg fluid  9:03 PM   ETOH abuse Order ciwa prtocol  Hypokalemia - will replace electrolytes and  repeat  check Mg, phos and Ca level and replace as needed Monitor on telemetry   Lab Results   Component Value Date   K 3.4 (L) 12/26/2023     Lab Results  Component Value Date   CREATININE 1.02 12/26/2023   Lab Results  Component Value Date   MG 1.9 11/17/2021   Lab Results  Component Value Date   CALCIUM  9.0 12/26/2023   PHOS 3.0 11/16/2021     Hx of stroke - not on aspirin   Hx of falls   Other plan as per orders.  DVT prophylaxis:  SCD       Code Status:    Code Status: Prior FULL CODE  as per patient   I had personally discussed CODE STATUS with patient   ACP   none    Family Communication:   Family not at  Bedside  plan of care was discussed  SO  Diet  Diet Orders (From admission, onward)     Start     Ordered   12/26/23 1703  Diet NPO time specified  (Septic presentation on arrival (screening labs, nursing and treatment orders for obvious sepsis))  Diet effective now        12/26/23 1704            Disposition Plan:      To home once workup is complete and patient is stable   Following barriers for discharge:                                                          Electrolytes corrected                                                       Pain controlled with PO medications                               Afebrile, white count improving able to transition to PO antibiotics                                                         Work of breathing improves       Consult Orders  (From admission, onward)           Start     Ordered   12/26/23 2023  Consult to hospitalist  Once       Provider:  (Not yet assigned)  Question Answer Comment  Place call to: [facility]   Reason for Consult Admit      12/26/23 2022                               Would benefit from PT/OT eval prior to DC  Ordered  Consults called:    NONE   Admission status:  ED Disposition     ED Disposition  Admit   Condition  --   Comment  Hospital Area: [facility] [[id number]]  Level of Care:  Progressive [102]  Admit to Progressive based on following criteria: MULTISYSTEM THREATS such as stable sepsis, metabolic/electrolyte imbalance with or without encephalopathy that is responding to early treatment.  May admit patient to [facility] or [facility] if equivalent level of care is available:: No  Diagnosis: CAP (community acquired pneumonia) [659315]  Admitting Physician: Cameryn Schum [[id number]]  Attending Physician: Oryon Gary [[id number]]  Certification:: I certify this patient will need inpatient services for at least 2 midnights  Expected Medical Readiness: 12/28/2023           inpatient     I Expect 2 midnight stay secondary to severity of patient's current illness need for inpatient interventions justified by the following:  hemodynamic instability despite optimal treatment (tachycardia  tachypnea * )   Severe lab/radiological/exam abnormalities including:    CAP and extensive comorbidities including:  substance abuse    That are currently affecting medical management.   I expect  patient to be hospitalized for 2 midnights requiring inpatient medical care.  Patient is at high risk for adverse outcome (such as loss of life or disability) if not treated.  Indication for inpatient stay as follows:  Severe change from baseline regarding mental status   Need for IV antibiotics, IV fluids,,     Level of care     progressive     [place] 12/26/2023, 10:26 PM    [facility]     after 2 AM please page floor coverage   If 7AM-7PM, please contact the day team taking care of the patient using [email or web address]

## 2023-12-26 NOTE — ED Triage Notes (Signed)
 Pt BIB [facility] from home AMS, this am, wife found him on the floor with blankets and pillows sleeping on the ground, pt stated he thought he was sleeping on the couch, over 4-5 hours, AMS is increased, pt is completely independent per wife. fall 1 week ago and was not evaluated.    130 HR RR 30 99% 3L 126 Cbg  162/90 1L LR

## 2023-12-26 NOTE — Assessment & Plan Note (Signed)
- -  Patient presenting with  productive cough, fever    , and infiltrate in   lower lobe on chest x-ray -Infiltrate on CXR and 2-3 characteristics (fever, leukocytosis, purulent sputum) are consistent with pneumonia. -This appears to be most likely community-acquired pneumonia.       will admit for treatment of CAP will start on appropriate antibiotic coverage. - Rocephin /azithromycin    Obtain:  sputum cultures,                  Obtain respiratory panel                    influenza serologies negative                  COVID PCR negative                    blood cultures and sputum cultures ordered                   strep pneumo UA antigen,                  check for Legionella antigen.                Provide oxygen as needed.

## 2023-12-26 NOTE — TOC CM/SW Note (Signed)
 TOC consult received for substance abuse. SW to f/u with patient as appropriate.   Merilee Batty, MSN, RN Case Management [phone]

## 2023-12-26 NOTE — Assessment & Plan Note (Signed)
-   most likely multifactorial secondary to combination of  infection   mild dehydration secondary to decreased by mouth intake,  polypharmacy   - Will rehydrate   - treat underlining infection   - Hold contributing medications   - if no improvement may need further imaging to evaluate for CNS pathology pathology such as MRI of the brain   - neurological exam appears to be nonfocal but patient unable to cooperate fully   - VBG    -  ammonia

## 2023-12-26 NOTE — Assessment & Plan Note (Signed)
-  SIRS criteria met with  elevated white blood cell count,       Component Value Date/Time   WBC 12.5 (H) 12/26/2023 1637   LYMPHSABS 0.4 (L) 12/26/2023 1637     tachycardia   ,   fever   RR >20 Today's Vitals   12/26/23 1815 12/26/23 1900 12/26/23 1915 12/26/23 2015  BP: 124/74 (!) 133/91 131/84 123/84  Pulse: (!) 129 (!) 129 (!) 130 (!) 124  Resp: (!) 24 (!) 33 17 (!) 28  Temp:      TempSrc:      SpO2: 99% 99% 98% 98%  Weight:      Height:      PainSc:          The recent clinical data is shown below. Vitals:   12/26/23 1815 12/26/23 1900 12/26/23 1915 12/26/23 2015  BP: 124/74 (!) 133/91 131/84 123/84  Pulse: (!) 129 (!) 129 (!) 130 (!) 124  Resp: (!) 24 (!) 33 17 (!) 28  Temp:      TempSrc:      SpO2: 99% 99% 98% 98%  Weight:      Height:           -Most likely source being: pulmonary,     Patient meeting criteria for Severe sepsis with    evidence of end organ damage/organ dysfunction such as    acute metabolic encephalopathy      - Obtain serial lactic acid and procalcitonin level.  - Initiated IV antibiotics in ER: Antibiotics Given (last 72 hours)     Date/Time Action Medication Dose Rate   12/26/23 1737 New Bag/Given   ceFEPIme  (MAXIPIME ) 2 g in sodium chloride  0.9 % 100 mL IVPB 2 g 200 mL/hr   12/26/23 1818 New Bag/Given   metroNIDAZOLE  (FLAGYL ) IVPB 500 mg 500 mg 100 mL/hr   12/26/23 1925 New Bag/Given   vancomycin  (VANCOCIN ) IVPB 1000 mg/200 mL premix 1,000 mg 200 mL/hr       Will continue  on : rocephin  azythro   - await results of blood and urine culture  - Rehydrate aggressively  Intravenous fluids were administered,         30cc/kg fluid  9:03 PM

## 2023-12-26 NOTE — Assessment & Plan Note (Signed)
-   Hold Ritalin 

## 2023-12-26 NOTE — Subjective & Objective (Signed)
 Patient had patient had a fall about a week ago was found today by this family on the floor a bit confused wrapped in blankets febrile up to 104.3 in ER When he fell about a week ago he hurt his face Since then he was seen walking around his walker until found today He was noted to have some cough No neck stiffness no headaches

## 2023-12-27 ENCOUNTER — Other Ambulatory Visit: Payer: Self-pay

## 2023-12-27 ENCOUNTER — Inpatient Hospital Stay (HOSPITAL_COMMUNITY)

## 2023-12-27 ENCOUNTER — Encounter (HOSPITAL_COMMUNITY): Payer: Self-pay | Admitting: Internal Medicine

## 2023-12-27 LAB — RAPID URINE DRUG SCREEN, HOSP PERFORMED
Amphetamines: NOT DETECTED
Barbiturates: NOT DETECTED
Benzodiazepines: POSITIVE — AB
Cocaine: NOT DETECTED
Opiates: NOT DETECTED
Tetrahydrocannabinol: NOT DETECTED

## 2023-12-27 LAB — COMPREHENSIVE METABOLIC PANEL WITH GFR
ALT: 30 U/L (ref 0–44)
AST: 64 U/L — ABNORMAL HIGH (ref 15–41)
Albumin: 2.4 g/dL — ABNORMAL LOW (ref 3.5–5.0)
Alkaline Phosphatase: 52 U/L (ref 38–126)
Anion gap: 9 (ref 5–15)
BUN: 10 mg/dL (ref 8–23)
CO2: 24 mmol/L (ref 22–32)
Calcium: 8 mg/dL — ABNORMAL LOW (ref 8.9–10.3)
Chloride: 98 mmol/L (ref 98–111)
Creatinine, Ser: 0.91 mg/dL (ref 0.61–1.24)
GFR, Estimated: >60 mL/min (ref >60)
Glucose, Bld: 109 mg/dL — ABNORMAL HIGH (ref 70–99)
Potassium: 3.8 mmol/L (ref 3.5–5.1)
Sodium: 131 mmol/L — ABNORMAL LOW (ref 135–145)
Total Bilirubin: 0.8 mg/dL (ref 0.0–1.2)
Total Protein: 5.2 g/dL — ABNORMAL LOW (ref 6.5–8.1)

## 2023-12-27 LAB — AMMONIA: Ammonia: <13 umol/L (ref 9–35)

## 2023-12-27 LAB — CBC
HCT: 36.8 % — ABNORMAL LOW (ref 39.0–52.0)
Hemoglobin: 12.5 g/dL — ABNORMAL LOW (ref 13.0–17.0)
MCH: 29.7 pg (ref 26.0–34.0)
MCHC: 34 g/dL (ref 30.0–36.0)
MCV: 87.4 fL (ref 80.0–100.0)
Platelets: 123 K/uL — ABNORMAL LOW (ref 150–400)
RBC: 4.21 MIL/uL — ABNORMAL LOW (ref 4.22–5.81)
RDW: 14.6 % (ref 11.5–15.5)
WBC: 10.8 K/uL — ABNORMAL HIGH (ref 4.0–10.5)
nRBC: 0 % (ref 0.0–0.2)

## 2023-12-27 LAB — BLOOD CULTURE ID PANEL (REFLEXED) - BCID2

## 2023-12-27 LAB — MAGNESIUM: Magnesium: 1.7 mg/dL (ref 1.7–2.4)

## 2023-12-27 LAB — PHOSPHORUS: Phosphorus: 2.4 mg/dL — ABNORMAL LOW (ref 2.5–4.6)

## 2023-12-27 LAB — MRSA NEXT GEN BY PCR, NASAL: MRSA by PCR Next Gen: NOT DETECTED

## 2023-12-27 LAB — STREP PNEUMONIAE URINARY ANTIGEN: Strep Pneumo Urinary Antigen: NEGATIVE

## 2023-12-27 LAB — OSMOLALITY, URINE: Osmolality, Ur: 674 mosm/kg (ref 300–900)

## 2023-12-27 LAB — TROPONIN I (HIGH SENSITIVITY)
Troponin I (High Sensitivity): 50 ng/L — ABNORMAL HIGH (ref <18)
Troponin I (High Sensitivity): 51 ng/L — ABNORMAL HIGH (ref <18)

## 2023-12-27 LAB — PROCALCITONIN: Procalcitonin: 8.62 ng/mL

## 2023-12-27 LAB — OSMOLALITY: Osmolality: 272 mosm/kg — ABNORMAL LOW (ref 275–295)

## 2023-12-27 LAB — CK: Total CK: 406 U/L — ABNORMAL HIGH (ref 49–397)

## 2023-12-27 LAB — CREATININE, URINE, RANDOM: Creatinine, Urine: 72 mg/dL

## 2023-12-27 LAB — SODIUM, URINE, RANDOM: Sodium, Ur: 116 mmol/L

## 2023-12-27 LAB — PREALBUMIN: Prealbumin: <5 mg/dL — ABNORMAL LOW (ref 18–38)

## 2023-12-27 MED ORDER — HYDROCODONE-ACETAMINOPHEN 5-325 MG PO TABS
1.0000 | ORAL_TABLET | ORAL | Status: DC | PRN
  Administered 2023-12-27 – 2023-12-30 (×2): 1 via ORAL
  Filled 2023-12-27 (×2): qty 1

## 2023-12-27 MED ORDER — ACETAMINOPHEN 650 MG RE SUPP: 650.0000 mg | Freq: Four times a day (QID) | RECTAL | Status: DC | PRN

## 2023-12-27 MED ORDER — GUAIFENESIN ER 600 MG PO TB12
600.0000 mg | ORAL_TABLET | Freq: Two times a day (BID) | ORAL | Status: DC
  Administered 2023-12-27 – 2024-01-01 (×12): 600 mg via ORAL
  Filled 2023-12-27 (×12): qty 1

## 2023-12-27 MED ORDER — PENICILLIN G POTASSIUM 20000000 UNITS IJ SOLR
24.0000 10*6.[IU] | INTRAVENOUS | Status: DC
  Administered 2023-12-27: 24 10*6.[IU] via INTRAVENOUS
  Filled 2023-12-27: qty 20
  Filled 2023-12-27 (×2): qty 24

## 2023-12-27 MED ORDER — ENSURE PLUS HIGH PROTEIN PO LIQD
237.0000 mL | Freq: Two times a day (BID) | ORAL | Status: DC
  Administered 2023-12-28 – 2024-01-01 (×8): 237 mL via ORAL

## 2023-12-27 MED ORDER — FENTANYL CITRATE (PF) 50 MCG/ML IJ SOSY: 12.5000 ug | PREFILLED_SYRINGE | INTRAMUSCULAR | Status: DC | PRN

## 2023-12-27 MED ORDER — ALBUTEROL SULFATE (2.5 MG/3ML) 0.083% IN NEBU: 2.5000 mg | INHALATION_SOLUTION | RESPIRATORY_TRACT | Status: DC | PRN

## 2023-12-27 MED ORDER — ONDANSETRON HCL 4 MG/2ML IJ SOLN: 4.0000 mg | Freq: Four times a day (QID) | INTRAMUSCULAR | Status: DC | PRN

## 2023-12-27 MED ORDER — ONDANSETRON HCL 4 MG PO TABS: 4.0000 mg | ORAL_TABLET | Freq: Four times a day (QID) | ORAL | Status: DC | PRN

## 2023-12-27 MED ORDER — MAGNESIUM SULFATE 2 GM/50ML IV SOLN
2.0000 g | Freq: Once | INTRAVENOUS | Status: AC
  Administered 2023-12-27: 2 g via INTRAVENOUS
  Filled 2023-12-27: qty 50

## 2023-12-27 MED ORDER — ACETAMINOPHEN 325 MG PO TABS
650.0000 mg | ORAL_TABLET | Freq: Four times a day (QID) | ORAL | Status: DC | PRN
  Administered 2023-12-27 – 2023-12-30 (×4): 650 mg via ORAL
  Filled 2023-12-27 (×4): qty 2

## 2023-12-27 MED ORDER — MEDIHONEY WOUND/BURN DRESSING EX PSTE
1.0000 | PASTE | Freq: Every day | CUTANEOUS | Status: DC
  Administered 2023-12-27 – 2024-01-01 (×6): 1 via TOPICAL
  Filled 2023-12-27: qty 44

## 2023-12-27 MED ORDER — METHYLPHENIDATE HCL 5 MG PO TABS: 10.0000 mg | ORAL_TABLET | Freq: Three times a day (TID) | ORAL | Status: DC

## 2023-12-27 MED ORDER — METOPROLOL TARTRATE 25 MG PO TABS: 12.5000 mg | ORAL_TABLET | Freq: Two times a day (BID) | ORAL | Status: DC

## 2023-12-27 MED ORDER — SODIUM CHLORIDE 0.9 % IV SOLN: INTRAVENOUS | Status: DC

## 2023-12-27 MED ORDER — METOPROLOL TARTRATE 5 MG/5ML IV SOLN
5.0000 mg | Freq: Four times a day (QID) | INTRAVENOUS | Status: DC
  Administered 2023-12-27 – 2023-12-28 (×5): 5 mg via INTRAVENOUS
  Filled 2023-12-27 (×5): qty 5

## 2023-12-27 NOTE — ED Notes (Signed)
 Pt having shoulder pain, pt reported that he has had it for a week. MD notified.

## 2023-12-27 NOTE — ED Notes (Signed)
Pt cleaned and bedding changed

## 2023-12-27 NOTE — ED Notes (Signed)
 Pt sleeping.

## 2023-12-27 NOTE — Progress Notes (Addendum)
 Progress Note   Patient: Jose [name] FMW:[id number] DOB: 1935/04/15 DOA: 12/26/2023     1 DOS: the patient was seen and examined on 12/27/2023   Brief hospital course: The patient is a 88 yr old man who presented to [facility] ED on 12/26/2023 with complaints of chills and confusion. The patient had been feeling weak and fell about a week ago injuring his face. He has also had a cough. He was found down by his family. They state that he usually gets around fine with his walker. Family states that he has not had anything to drink for the past week.  PMHX positive for chronic fatigue, GERD, HLD, irritable spinal stenosis, CVA, daily alcohol  use (DT's a few years ago).  In the ED he was found to have a Right upper lobe infiltrate on X-ray with fever, tachypnea, and leukocytosis. He was admitted on IV Rocephin  and azithromycin . He was also found to be dehydrated, and to be taking medications which may be contributing to confusion.  He is currently saturating at 94% on room air. He remains confused.  Assessment and Plan:  Sepsis The patient presented with tachypnea, tachycardia, and leukocytosis due to community acquired pneumonia. One day after admission his heart rate remains in the 100's, RR remains 25, but WBC has decreased from 12.5 to 10.8.  Community Acquired Pneumonia The patient is receiving IV Rocephin  and azithromycin . Procalcitonin is 8.62. WBC has reduced from 12.5 to 10.8. The patient continues to have rales on the right upper/midlung, and mild rales on the left base.   Strep Bacteremia Blood cultures x 2 were obtained in the ED. BCID has come back positive for strep pyogenes. Await sensitivities.  Acute Metabolic Encephalopathy Improving. Multifactorial in origin  - Sepsis, infection, dehydration, Xanax  and ritalin .   Cellulitis There is an area of ulceration, ,erythema, and calor on the lateral aspect of the left knee. He is receiving IV penicillin. Monitor. Will screen for  MRSA.  Hyperlipidemia Not on statin.   Chronic fatigue syndrome Hold Ritalin    Rhabdomyolysis CK is decreased from 680 to 406. Stop infusion.  I have seen and examined this patient myself. I have spent 38 minutes in his evaluation and care.  Subjective: The patient is resting comfortably. He does not know exactly where he is or why he is in the hospital. He does not know the year. Still pretty confused.  Physical Exam: Vitals:   12/27/23 1015 12/27/23 1049 12/27/23 1115 12/27/23 1130  BP: 124/88  132/80 127/80  Pulse: (!) 102  (!) 108 (!) 110  Resp: (!) 27  (!) 25 18  Temp:  98.4 F (36.9 C)    TempSrc:  Oral    SpO2: 96%  98% 97%  Weight:      Height:       Exam:  Constitutional:  The patient is awake, alert, and oriented x 3. No acute distress. Eyes:  pupils and irises appear normal Normal lids and conjunctivae ENMT:  grossly normal hearing  Lips appear normal external ears, nose appear normal Oropharynx: mucosa, tongue,posterior pharynx appear normal Neck:  neck appears normal, no masses, normal ROM, supple no thyromegaly Respiratory:  No increased work of breathing. No wheezes,or rhonchi Positive for rales at right upper and mid lung. Mild rales at left base. No tactile fremitus Cardiovascular:  Regular rate and rhythm (fast rate) No murmurs, ectopy, or gallups. No lateral PMI. No thrills. Abdomen:  Abdomen is soft, non-tender, non-distended No hernias, masses, or organomegaly Normoactive bowel  sounds.  Musculoskeletal:  No cyanosis, clubbing, or edema Skin:  No rashes, lesions, ulcers palpation of skin: no induration or nodules Neurologic:  CN 2-12 intact Movign all extremities Psychiatric:  Mental status Mood, affect appropriate Orientation to person and place, but not situation or date or time. judgment and insight appear diminished.  Data Reviewed:  CBC CMP CXR  Family Communication: None available  Disposition: Status is:  Inpatient Remains inpatient appropriate because: Need for close monitoring of cultures from bacteremia and respiratory situration.  Planned Discharge Destination: Home    Time spent: 38 minutes  Author: Kirby Argueta, DO 12/27/2023 1:23 PM  For on call review [email or web address].

## 2023-12-27 NOTE — ED Notes (Signed)
Transport taking pt upstairs

## 2023-12-27 NOTE — Plan of Care (Signed)
  Problem: Clinical Measurements: Goal: Cardiovascular complication will be avoided Outcome: Progressing   Problem: Activity: Goal: Risk for activity intolerance will decrease Outcome: Progressing   Problem: Elimination: Goal: Will not experience complications related to urinary retention Outcome: Progressing   

## 2023-12-27 NOTE — ED Notes (Signed)
 Pt shivering, causing interference on cardiac monitor. Pt assessed, 115 manual HR, warm blanket applied to pt. Pt also reoriented to location and situation.

## 2023-12-27 NOTE — Consult Note (Signed)
 WOC Nurse Consult Note: Patient admitted with Community Acquired Pneumonia.  He had a fall over a week ago (he indicates 10 days) and has subsequent bruising and injury from that fall.  Reason for Consult: Skin tears, mechanical abrasions after fall.  Wound type:trauma Pressure Injury POA: NA Measurement: Left medial lower arm:  8 cm x 0.3 cm skin tear, edges not approximated  crusted dry fibrin to wound bed  Left eye with laceration to supraorbital ridge, dried at this time  Bruising around left eye Bilateral lower arms with bruising and scattered scabbed abrasions from fall.  Wound azi:rmlduzi and dry Drainage (amount, consistency, odor) none Periwound: dry thin skin  Dressing procedure/placement/frequency: Cleanse skin tear to left arm with NS and pat dry. Apply medihoney to wound bed COver with gauze and kerlix/tape  Change daily.  Will not follow at this time.  Please re-consult if needed.  Darice Cooley MSN, RN, FNP-BC CWON Wound, Ostomy, Continence Nurse Outpatient [facility] [phone] Work cell phone:  [phone]

## 2023-12-27 NOTE — Progress Notes (Addendum)
 PHARMACY - PHYSICIAN COMMUNICATION CRITICAL VALUE ALERT - BLOOD CULTURE IDENTIFICATION (BCID)  Jose [name] is an 88 y.o. male who presented to [facility] on 12/26/2023 with a chief complaint of AMS  Assessment: 35 YOM who presented with AMS, recent fall last week with noted head abrasion, and tenderness to back, arm, and thigh - some small skin tears. Now found to have 4/4 bottles growing GPC with BCID detecting strep pyogenes (group A strep)  Name of physician (or Provider) Contacted: Swayze  Current antibiotics: Rocephin  + Azithromycin   Changes to prescribed antibiotics recommended:  Adjust to penicillin 24 million units daily as a continuous infusion No concern for hemodynamic instability or necrotizing fasciitis - per discussion w/ MD holding linezolid for now  Results for orders placed or performed during the hospital encounter of 12/08/21  Blood Culture ID Panel (Reflexed) (Collected: 12/08/2021  4:53 AM)  Result Value Ref Range   Enterococcus faecalis NOT DETECTED NOT DETECTED   Enterococcus Faecium NOT DETECTED NOT DETECTED   Listeria monocytogenes NOT DETECTED NOT DETECTED   Staphylococcus species DETECTED (A) NOT DETECTED   Staphylococcus aureus (BCID) NOT DETECTED NOT DETECTED   Staphylococcus epidermidis NOT DETECTED NOT DETECTED   Staphylococcus lugdunensis NOT DETECTED NOT DETECTED   Streptococcus species NOT DETECTED NOT DETECTED   Streptococcus agalactiae NOT DETECTED NOT DETECTED   Streptococcus pneumoniae NOT DETECTED NOT DETECTED   Streptococcus pyogenes NOT DETECTED NOT DETECTED   A.calcoaceticus-baumannii NOT DETECTED NOT DETECTED   Bacteroides fragilis NOT DETECTED NOT DETECTED   Enterobacterales NOT DETECTED NOT DETECTED   Enterobacter cloacae complex NOT DETECTED NOT DETECTED   Escherichia coli NOT DETECTED NOT DETECTED   Klebsiella aerogenes NOT DETECTED NOT DETECTED   Klebsiella oxytoca NOT DETECTED NOT DETECTED   Klebsiella pneumoniae NOT  DETECTED NOT DETECTED   Proteus species NOT DETECTED NOT DETECTED   Salmonella species NOT DETECTED NOT DETECTED   Serratia marcescens NOT DETECTED NOT DETECTED   Haemophilus influenzae NOT DETECTED NOT DETECTED   Neisseria meningitidis NOT DETECTED NOT DETECTED   Pseudomonas aeruginosa NOT DETECTED NOT DETECTED   Stenotrophomonas maltophilia NOT DETECTED NOT DETECTED   Candida albicans NOT DETECTED NOT DETECTED   Candida auris NOT DETECTED NOT DETECTED   Candida glabrata NOT DETECTED NOT DETECTED   Candida krusei NOT DETECTED NOT DETECTED   Candida parapsilosis NOT DETECTED NOT DETECTED   Candida tropicalis NOT DETECTED NOT DETECTED   Cryptococcus neoformans/gattii NOT DETECTED NOT DETECTED    Thank you for allowing pharmacy to be a part of this patient's care.  Almarie Lunger, PharmD, BCPS, BCIDP Infectious Diseases Clinical Pharmacist 12/27/2023 10:35 AM   **Pharmacist phone directory can now be found on [email or web address] (PW [email or web address]).  Listed under [facility].

## 2023-12-27 NOTE — ED Notes (Signed)
 PT at bedside.

## 2023-12-27 NOTE — Evaluation (Signed)
 Physical Therapy Evaluation Patient Details Name: Jose [name] MRN: [id number] DOB: 03/10/1935 Today's Date: 12/27/2023  History of Present Illness  Pt is an 88 y/o male admitted 12/3 with chills, confusion, cough and recent fall.  CT showed suspected PNA in the R upper lobe.  Pt is significant L shoulder pain, no fx's on imaging.   PMHx, R knee meniscal tear, CAP, IBS, STROKE  Clinical Impression  Pt admitted with/for the problems above.  Mobility is difficult today on evaluation and pt needing mod to max overall and is limited at this time..  Pt currently limited functionally due to the problems listed. ( See problems list.)   Pt will benefit from PT to maximize function and safety in order to get ready for next venue listed below.  Patient will benefit from continued inpatient follow up therapy, <3 hours/day          If plan is discharge home, recommend the following: A lot of help with walking and/or transfers;A lot of help with bathing/dressing/bathroom;Assistance with feeding;Assist for transportation   Can travel by private vehicle   No    Equipment Recommendations Rolling walker (2 wheels)  Recommendations for Other Services       Functional Status Assessment Patient has had a recent decline in their functional status and demonstrates the ability to make significant improvements in function in a reasonable and predictable amount of time.     Precautions / Restrictions Precautions Precautions: Fall Recall of Precautions/Restrictions: Impaired      Mobility  Bed Mobility Overal bed mobility: Needs Assistance Bed Mobility: Supine to Sit, Sit to Supine     Supine to sit: Mod assist Sit to supine: Mod assist, +2 for physical assistance   General bed mobility comments: pt having difficulty, initially following direction then executing even with multimodal cues    Transfers Overall transfer level: Needs assistance   Transfers: Sit to/from Stand Sit to Stand:  Mod assist, Max assist           General transfer comment: cued for hand placement and sequencing.  stood x4 for assessment the for pericare and bed change.    Ambulation/Gait               General Gait Details: not able today  Stairs            Wheelchair Mobility     Tilt Bed    Modified Rankin (Stroke Patients Only)       Balance Overall balance assessment: Needs assistance Sitting-balance support: Single extremity supported, Bilateral upper extremity supported, Feet supported Sitting balance-Leahy Scale: Poor Sitting balance - Comments: list to the right, needing R UE assist for sitting in length of time.   Standing balance support: Single extremity supported, Bilateral upper extremity supported, During functional activity Standing balance-Leahy Scale: Poor Standing balance comment: face to face assist  with full truncal assist forward and boosted x4                             Pertinent Vitals/Pain Pain Assessment Pain Assessment: Faces Faces Pain Scale: Hurts even more Pain Location: L shoulder/humeral pain Pain Descriptors / Indicators: Discomfort, Grimacing, Guarding, Sore, Sharp Pain Intervention(s): Monitored during session    Home Living Family/patient expects to be discharged to:: Skilled nursing facility Living Arrangements: Alone                      Prior Function Prior  Level of Function : Independent/Modified Independent                     Extremity/Trunk Assessment   Upper Extremity Assessment Upper Extremity Assessment: Generalized weakness;LUE deficits/detail LUE Deficits / Details: significant pain with movement, but likely exaggerated due to mentation. LUE Coordination: decreased fine motor    Lower Extremity Assessment Lower Extremity Assessment: Generalized weakness    Cervical / Trunk Assessment Cervical / Trunk Assessment: Kyphotic  Communication   Communication Communication: No  apparent difficulties    Cognition Arousal: Alert Behavior During Therapy: WFL for tasks assessed/performed, Agitated, Flat affect   PT - Cognitive impairments: No family/caregiver present to determine baseline                         Following commands: Impaired Following commands impaired: Follows one step commands with increased time, Follows one step commands inconsistently     Cueing Cueing Techniques: Verbal cues     General Comments General comments (skin integrity, edema, etc.): vcc    Exercises     Assessment/Plan    PT Assessment Patient needs continued PT services  PT Problem List         PT Treatment Interventions Functional mobility training;Therapeutic activities;DME instruction;Gait training;Therapeutic exercise;Balance training;Patient/family education    PT Goals (Current goals can be found in the Care Plan section)  Acute Rehab PT Goals Patient Stated Goal: pt unable to assist PT Goal Formulation: Patient unable to participate in goal setting Time For Goal Achievement: 01/10/24 Potential to Achieve Goals: Good    Frequency Min 3X/week     Co-evaluation               AM-PAC PT 6 Clicks Mobility  Outcome Measure Help needed turning from your back to your side while in a flat bed without using bedrails?: A Lot Help needed moving from lying on your back to sitting on the side of a flat bed without using bedrails?: A Lot Help needed moving to and from a bed to a chair (including a wheelchair)?: A Lot Help needed standing up from a chair using your arms (e.g., wheelchair or bedside chair)?: A Lot Help needed to walk in hospital room?: Total Help needed climbing 3-5 steps with a railing? : Total 6 Click Score: 10    End of Session   Activity Tolerance: Patient tolerated treatment well;Patient limited by pain;Patient limited by fatigue Patient left: in bed;in CPM;with restraints reapplied Nurse Communication: Mobility status PT  Visit Diagnosis: Unsteadiness on feet (R26.81)    Time: 8879-8774 PT Time Calculation (min) (ACUTE ONLY): 65 min   Charges:   PT Evaluation $PT Eval Moderate Complexity: 1 Mod PT Treatments $Therapeutic Activity: 23-37 mins $Self Care/Home Management: 8-22 PT General Charges $$ ACUTE PT VISIT: 1 Visit         12/27/2023  Jose HERO., PT Acute Rehabilitation Services [phone]  (office)  Jose [name] 12/27/2023, 2:17 PM

## 2023-12-27 NOTE — ED Notes (Signed)
 Notified phlebotomy of unsuccessful straight stick and inability to draw blood from iv's for lab work. Requested them to see if they can try to straight stick for labs.

## 2023-12-27 NOTE — ED Notes (Signed)
 Attempted to draw blood off of iv, unsuccessful.

## 2023-12-28 DIAGNOSIS — R6520 Severe sepsis without septic shock: Secondary | ICD-10-CM | POA: Diagnosis not present

## 2023-12-28 DIAGNOSIS — G9341 Metabolic encephalopathy: Secondary | ICD-10-CM | POA: Diagnosis not present

## 2023-12-28 DIAGNOSIS — A419 Sepsis, unspecified organism: Secondary | ICD-10-CM | POA: Diagnosis not present

## 2023-12-28 LAB — CBC WITH DIFFERENTIAL/PLATELET
Abs Immature Granulocytes: 0.07 K/uL (ref 0.00–0.07)
Basophils Absolute: 0 K/uL (ref 0.0–0.1)
Basophils Relative: 0 %
Eosinophils Absolute: 0 K/uL (ref 0.0–0.5)
Eosinophils Relative: 0 %
HCT: 34 % — ABNORMAL LOW (ref 39.0–52.0)
Hemoglobin: 11.9 g/dL — ABNORMAL LOW (ref 13.0–17.0)
Immature Granulocytes: 1 %
Lymphocytes Relative: 9 %
Lymphs Abs: 0.8 K/uL (ref 0.7–4.0)
MCH: 30 pg (ref 26.0–34.0)
MCHC: 35 g/dL (ref 30.0–36.0)
MCV: 85.6 fL (ref 80.0–100.0)
Monocytes Absolute: 1.3 K/uL — ABNORMAL HIGH (ref 0.1–1.0)
Monocytes Relative: 14 %
Neutro Abs: 7 K/uL (ref 1.7–7.7)
Neutrophils Relative %: 76 %
Platelets: 123 K/uL — ABNORMAL LOW (ref 150–400)
RBC: 3.97 MIL/uL — ABNORMAL LOW (ref 4.22–5.81)
RDW: 14.5 % (ref 11.5–15.5)
WBC: 9.2 K/uL (ref 4.0–10.5)
nRBC: 0 % (ref 0.0–0.2)

## 2023-12-28 LAB — BASIC METABOLIC PANEL WITH GFR
Anion gap: 11 (ref 5–15)
BUN: 12 mg/dL (ref 8–23)
CO2: 23 mmol/L (ref 22–32)
Calcium: 7.8 mg/dL — ABNORMAL LOW (ref 8.9–10.3)
Chloride: 93 mmol/L — ABNORMAL LOW (ref 98–111)
Creatinine, Ser: 0.91 mg/dL (ref 0.61–1.24)
GFR, Estimated: >60 mL/min (ref >60)
Glucose, Bld: 109 mg/dL — ABNORMAL HIGH (ref 70–99)
Potassium: 3.6 mmol/L (ref 3.5–5.1)
Sodium: 127 mmol/L — ABNORMAL LOW (ref 135–145)

## 2023-12-28 LAB — PROCALCITONIN: Procalcitonin: 7.17 ng/mL

## 2023-12-28 LAB — URIC ACID: Uric Acid, Serum: 2.9 mg/dL — ABNORMAL LOW (ref 3.7–8.6)

## 2023-12-28 LAB — C-REACTIVE PROTEIN: CRP: 23.6 mg/dL — ABNORMAL HIGH (ref <1.0)

## 2023-12-28 LAB — SODIUM, URINE, RANDOM: Sodium, Ur: 68 mmol/L

## 2023-12-28 LAB — CREATININE, URINE, RANDOM: Creatinine, Urine: 81 mg/dL

## 2023-12-28 LAB — OSMOLALITY: Osmolality: 266 mosm/kg — ABNORMAL LOW (ref 275–295)

## 2023-12-28 LAB — OSMOLALITY, URINE: Osmolality, Ur: 525 mosm/kg (ref 300–900)

## 2023-12-28 MED ORDER — METOPROLOL TARTRATE 50 MG PO TABS
50.0000 mg | ORAL_TABLET | Freq: Two times a day (BID) | ORAL | Status: DC
  Administered 2023-12-28 – 2024-01-01 (×8): 50 mg via ORAL
  Filled 2023-12-28 (×9): qty 1

## 2023-12-28 MED ORDER — PANTOPRAZOLE SODIUM 40 MG PO TBEC
40.0000 mg | DELAYED_RELEASE_TABLET | Freq: Every day | ORAL | Status: DC
  Administered 2023-12-28 – 2024-01-01 (×5): 40 mg via ORAL
  Filled 2023-12-28 (×5): qty 1

## 2023-12-28 MED ORDER — FUROSEMIDE 10 MG/ML IJ SOLN
40.0000 mg | Freq: Once | INTRAMUSCULAR | Status: AC
  Administered 2023-12-28: 40 mg via INTRAVENOUS
  Filled 2023-12-28: qty 4

## 2023-12-28 MED ORDER — SODIUM CHLORIDE 0.9 % IV SOLN
24.0000 10*6.[IU] | INTRAVENOUS | Status: AC
  Administered 2023-12-28 – 2023-12-31 (×4): 24 10*6.[IU] via INTRAVENOUS
  Filled 2023-12-28 (×2): qty 20
  Filled 2023-12-28 (×2): qty 24

## 2023-12-28 MED ORDER — MAGNESIUM SULFATE 2 GM/50ML IV SOLN
2.0000 g | Freq: Once | INTRAVENOUS | Status: AC
  Administered 2023-12-28: 2 g via INTRAVENOUS
  Filled 2023-12-28: qty 50

## 2023-12-28 NOTE — Evaluation (Signed)
 Clinical/Bedside Swallow Evaluation Patient Details  Name: Jose [name] MRN: [id number] Date of Birth: 29-Apr-1935  Today's Date: 12/28/2023 Time: SLP Start Time (ACUTE ONLY): 1035 SLP Stop Time (ACUTE ONLY): 1045 SLP Time Calculation (min) (ACUTE ONLY): 10 min  Past Medical History:  Past Medical History:  Diagnosis Date   Acute meniscal tear of knee    RIGHT KNEE   Arthritis    Cancer (HCC)    skin   CAP (community acquired pneumonia) 12/08/2021   GERD (gastroesophageal reflux disease) 11/19/2020   Hives    PER PT UNKNOWN WHY   Hyperlipidemia 12/08/2021   IBS (irritable bowel syndrome)    Mild sleep apnea    PER STUDY  11-30-2010--  NO CPAP   Neuromuscular disorder (HCC)    Spinal stenosis    Stroke (HCC)    unsure per pt   Past Surgical History:  Past Surgical History:  Procedure Laterality Date   APPENDECTOMY     COLONOSCOPY     HEMORRHOID SURGERY     KNEE ARTHROSCOPY Bilateral    x4   KNEE ARTHROSCOPY WITH LATERAL MENISECTOMY Right 04/12/2012   Procedure: RIGHT KNEE ARTHROSCOPY WITH PARTIAL MEDIAL AND LATERAL MENISECTOMY;  Surgeon: [name], MD;  Location: [facility];  Service: Orthopedics;  Laterality: Right;   REVERSE SHOULDER ARTHROPLASTY Right 10/13/2022   Procedure: REVERSE SHOULDER ARTHROPLASTY;  Surgeon: [name], MD;  Location: [facility];  Service: Orthopedics;  Laterality: Right;  interscalene block flip room   SHOULDER ARTHROSCOPY WITH ROTATOR CUFF REPAIR AND SUBACROMIAL DECOMPRESSION  12-21-1999  &  01-28-2004   HPI:  Jose [name] is an 88 yr old M who presented to [name] ED on 12/26/2023 with complaints of chills and confusion. The patient had been feeling weak and fell about a week ago injuring his face. He has also had a cough. He was found down by his family. Chest CT 12/3: There is dependent atelectasis in the bilateral lower  lobes. There are patchy ground-glass opacities in the right upper  lobe, likely  infectious/inflammatory.  Head CT 12/3 with no acute findings. PMHX includes chronic fatigue, GERD, HLD, irritable spinal stenosis, CVA, daily alcohol  use (DT's a few years ago).    Assessment / Plan / Recommendation  Clinical Impression  Pt presents with functional swallowing as assessed clinically.  Pt tolerated all consistencies trialed including serial straw sips of thin liquid.  Pt exhibited good clearance of solids.  He was noted to place cracker on sides of mouth and bite with molars. Pt states he has temporary bridge in place for front teeth and is not supposed to chew with it.  Permanent bridge to be placed soon.  Pt has no further ST needs. SLP will sign off at thist ime.    Recommend continuing regular texture diet with thin liquids.   SLP Visit Diagnosis: Dysphagia, unspecified (R13.10)    Aspiration Risk  No limitations    Diet Recommendation Regular;Thin liquid    Liquid Administration via: Cup;Straw Medication Administration: Whole meds with liquid Supervision: Patient able to self feed Compensations: Slow rate;Small sips/bites Postural Changes: Seated upright at 90 degrees    Other Recommendations Oral Care Recommendations: Oral care BID     Swallow Evaluation Recommendations  See above   Assistance Recommended at Discharge  N/A  Functional Status Assessment Patient has not had a recent decline in their functional status  Frequency and Duration  (N/A)          Prognosis Prognosis  for improved oropharyngeal function:  (N/A)      Swallow Study   General Date of Onset: 12/26/23 HPI: Jose [name] is an 88 yr old M who presented [facility] ED on 12/26/2023 with complaints of chills and confusion. The patient had been feeling weak and fell about a week ago injuring his face. He has also had a cough. He was found down by his family. Chest CT 12/3: There is dependent atelectasis in the bilateral lower  lobes. There are patchy ground-glass opacities in the right upper   lobe, likely infectious/inflammatory.  Head CT 12/3 with no acute findings. PMHX includes chronic fatigue, GERD, HLD, irritable spinal stenosis, CVA, daily alcohol  use (DT's a few years ago). Type of Study: Bedside Swallow Evaluation Previous Swallow Assessment: none Diet Prior to this Study: Regular;Thin liquids (Level 0) Temperature Spikes Noted: No History of Recent Intubation: No Behavior/Cognition: Alert;Cooperative;Pleasant mood Oral Cavity Assessment: Within Functional Limits Oral Care Completed by SLP: No Oral Cavity - Dentition: Adequate natural dentition (temporary bridge in front) Vision: Functional for self-feeding Self-Feeding Abilities: Able to feed self Patient Positioning: Upright in bed Baseline Vocal Quality: Normal Volitional Cough: Strong Volitional Swallow: Able to elicit    Oral/Motor/Sensory Function Overall Oral Motor/Sensory Function: Within functional limits   Ice Chips Ice chips: Not tested   Thin Liquid Thin Liquid: Within functional limits Presentation: Straw    Nectar Thick Nectar Thick Liquid: Not tested   Honey Thick Honey Thick Liquid: Not tested   Puree Puree: Within functional limits Presentation: Spoon   Solid     Solid: Within functional limits Presentation: Self Fed      Anette FORBES Grippe, MA, CCC-SLP Acute Rehabilitation Services Office: [phone] 12/28/2023,10:52 AM

## 2023-12-28 NOTE — TOC Initial Note (Signed)
 Transition of Care [facility]) - Initial/Assessment Note    Patient Details  Name: Jose [name] MRN: [id number] Date of Birth: 06/19/1935  Transition of Care [facility]) CM/SW Contact:    Whitfield Campanile, Student-Social Work Phone Number: 12/28/2023, 11:25 AM  Clinical Narrative:                  11:08 AM: MSW intern introduced self & role to patient and spouse at bedside. Discussed physical therapy's recommendation for receiving care at SNF. Explained SNF process. Agreeable to send of SNF referrals. Spouse also gave patient's son # Jose [name] [phone] to also provide with bed offers once available.  TOC will follow up with bed offers.  Whitfield Campanile, MSW Intern        Patient Goals and CMS Choice            Expected Discharge Plan and Services                                              Prior Living Arrangements/Services                       Activities of Daily Living   ADL Screening (condition at time of admission) Independently performs ADLs?: No Does the patient have a NEW difficulty with bathing/dressing/toileting/self-feeding that is expected to last >3 days?: Yes (Initiates electronic notice to provider for possible OT consult) Does the patient have a NEW difficulty with getting in/out of bed, walking, or climbing stairs that is expected to last >3 days?: Yes (Initiates electronic notice to provider for possible PT consult) Does the patient have a NEW difficulty with communication that is expected to last >3 days?: No Is the patient deaf or have difficulty hearing?: Yes Does the patient have difficulty seeing, even when wearing glasses/contacts?: No Does the patient have difficulty concentrating, remembering, or making decisions?: Yes  Permission Sought/Granted                  Emotional Assessment              Admission diagnosis:  Elevated CK [R74.8] CAP (community acquired pneumonia) [J18.9] Pneumonia due to  infectious organism, unspecified laterality, unspecified part of lung [J18.9] Sepsis, due to unspecified organism, unspecified whether acute organ dysfunction present [facility]) [A41.9] Patient Active Problem List   Diagnosis Date Noted   CAP (community acquired pneumonia) 12/26/2023   Rhabdomyolysis 12/26/2023   ETOH abuse 12/26/2023   Hypokalemia 12/26/2023   Onychoschizia 08/29/2023   Encounter for general adult medical examination with abnormal findings 08/29/2023   Itching 07/02/2023   Pain of left thumb 04/17/2023   Unilateral primary osteoarthritis of first carpometacarpal joint, left hand 04/17/2023   Muscle weakness (generalized) 11/08/2022   Stiffness of right shoulder joint 11/08/2022   S/P shoulder replacement, right 10/13/2022   Arthralgia of right knee 08/23/2022   Osteoarthritis of knee 08/23/2022   Pain of both shoulder joints 08/23/2022   Malnutrition of moderate degree 12/09/2021   Sepsis (HCC) 12/08/2021   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   Hyperglycemia 12/08/2021   History of CVA (cerebrovascular accident) 12/08/2021   Hyperlipidemia 12/08/2021   Pressure injury of skin 11/21/2021   Pain in right elbow    Urinary frequency 10/26/2021   Weakness 08/23/2021   Alcohol  abuse 08/23/2021   Insomnia 07/18/2021   Rotator cuff tear arthropathy 01/04/2021  Chronic fatigue syndrome 11/19/2020   Arthritis 11/19/2020   GERD (gastroesophageal reflux disease) 11/19/2020   Impairment of balance 11/18/2020   Acute metabolic encephalopathy 05/19/2020   Ataxia 05/18/2020   Thiamine  deficiency 05/18/2020   Neck pain 12/16/2018   Pain in thoracic spine 09/13/2018   Lumbar spondylosis 06/27/2017   Degeneration of lumbar intervertebral disc 05/21/2017   Displacement of lumbar intervertebral disc without myelopathy 12/25/2013   PCP:  [name], MD Pharmacy:   [facility] [address] [place] - [address] [place]  [address] Phone: [phone] Fax: [phone]     Social Drivers of Health (SDOH) Social History: SDOH Screenings   Food Insecurity: No Food Insecurity (09/05/2023)  Housing: Unknown (09/05/2023)  Transportation Needs: No Transportation Needs (09/05/2023)  Utilities: Not At Risk (09/05/2023)  Alcohol  Screen: Low Risk  (09/05/2023)  Depression (PHQ2-9): High Risk (10/16/2023)  Financial Resource Strain: Low Risk  (09/05/2023)  Physical Activity: Sufficiently Active (09/05/2023)  Social Connections: Socially Isolated (09/05/2023)  Stress: No Stress Concern Present (09/05/2023)  Tobacco Use: Medium Risk (12/26/2023)  Health Literacy: Adequate Health Literacy (09/05/2023)   SDOH Interventions:     Readmission Risk Interventions     No data to display

## 2023-12-28 NOTE — Progress Notes (Signed)
 PROGRESS NOTE     Patient Demographics:    Jose [name], is a 88 y.o. male, DOB - 12/22/1935, FMW:[id number]  Outpatient Primary MD for the patient is [name], MD    LOS - 2  Admit date - 12/26/2023    Chief Complaint  Patient presents with   Altered Mental Status       Brief Narrative (HPI from H&P)    88 yr old man who presented to [facility] ED on 12/26/2023 with complaints of chills and confusion. The patient had been feeling weak and fell about a week ago injuring his face. He has also had a cough. He was found down by his family. They state that he usually gets around fine with his walker. Family states that he has not had anything to drink for the past week.   PMHX positive for chronic fatigue, GERD, HLD, irritable spinal stenosis, CVA, daily alcohol  use (DT's a few years ago).   In the ED he was found to have a Right upper lobe infiltrate on X-ray with fever, tachypnea, and leukocytosis.  Further workup suggested he had strep pyogenes bacteremia likely due to community-acquired pneumonia and was admitted to the hospital.   Subjective:    Jose [name] today has, No headache, No chest pain, No abdominal pain - No Nausea, No new weakness tingling or numbness, no shortness of breath however he is quite confused.   Assessment  & Plan :   Sepsis with strep pyogenes bacteremia likely from community-acquired pneumonia along with left knee cellulitis PT chest noted, no other subjective complaints although he is quite confused and a poor historian, ID has placed him on penicillin  G which will be continued, monitor inflammatory markers, currently afebrile and not toxic, sepsis pathophysiology improving.   Speech eval as well and monitor clinically.   Acute Metabolic Encephalopathy, questionable DTs Likely hospital-acquired acquired encephalopathy on top of underlying age-related cognitive decline, at risk for delirium, minimize narcotics and benzodiazepines and monitor.  He does take benzodiazepines at home hence on low-dose here, also question of DTs, being monitored on CIWA protocol.  Cellulitis There is an area of ulceration, ,erythema, and calor on the lateral aspect of the left knee. He is receiving IV penicillin . Monitor. Will screen for MRSA.   Hyperlipidemia Not on statin.   Chronic fatigue syndrome Hold Ritalin    Rhabdomyolysis Improved with supportive care.  SIADH with hyponatremia.  Fluid restriction and Lasix , monitor.      Condition -   Guarded  Family Communication  : Called wife [phone]  on 12/28/2023 at 9:30 AM and message left  Code Status : Full code  Consults  : None  PUD Prophylaxis : PPI.   Procedures  :            Disposition Plan  :    Status is: Inpatient   DVT Prophylaxis  :    SCDs Start: 12/27/23 9166   Lab Results  Component Value Date   PLT 123 (L) 12/28/2023    Diet :  Diet Order             Diet Heart Room service appropriate? Yes; Fluid consistency: Thin; Fluid restriction: 1500 mL Fluid  Diet effective now                    Inpatient Medications  Scheduled Meds:  feeding supplement  237 mL Oral BID BM   folic acid   1 mg Oral Daily   furosemide   40 mg Intravenous Once   guaiFENesin   600 mg Oral BID   leptospermum manuka honey  1 Application Topical Daily   metoprolol  tartrate  50 mg Oral BID   multivitamin with minerals  1 tablet Oral Daily   pantoprazole   40 mg Oral Daily   thiamine   100 mg Oral Daily   Or   thiamine   100 mg Intravenous Daily   Continuous Infusions:  penicillin  G potassium 24 Million Units in dextrose  5 % 500 mL CONTINUOUS infusion 20.8 mL/hr at 12/27/23 1500   PRN  Meds:.acetaminophen  **OR** acetaminophen , albuterol , fentaNYL  (SUBLIMAZE ) injection, HYDROcodone -acetaminophen , LORazepam  **OR** LORazepam , ondansetron  **OR** ondansetron  (ZOFRAN ) IV    Objective:   Vitals:   12/27/23 2000 12/28/23 0000 12/28/23 0400 12/28/23 0810  BP: 121/83 124/84 139/77 131/73  Pulse: (!) 112 (!) 106 88 (!) 101  Resp: 18 20 18  (!) 21  Temp: 99.1 F (37.3 C) 99.2 F (37.3 C) 98.9 F (37.2 C) 97.8 F (36.6 C)  TempSrc: Oral Oral Oral Oral  SpO2: 94% 94% 93% 98%  Weight:      Height:        Wt Readings from Last 3 Encounters:  12/26/23 70.3 kg  10/16/23 70.6 kg  09/05/23 71.7 kg     Intake/Output Summary (Last 24 hours) at 12/28/2023 0932 Last data filed at 12/28/2023 9188 Gross per 24 hour  Intake 3804.58 ml  Output 1500 ml  Net 2304.58 ml     Physical Exam  Awake but pleasantly confused, no new F.N deficits,   [place].AT,PERRAL Supple Neck, No JVD,   Symmetrical Chest wall movement, Good air movement bilaterally, CTAB RRR,No Gallops,Rubs or new Murmurs,  +ve B.Sounds, Abd Soft, No tenderness,   No Cyanosis, Clubbing or edema       Data Review:    Recent Labs  Lab 12/26/23 1637 12/27/23 1236 12/28/23 0254  WBC 12.5* 10.8* 9.2  HGB 14.9 12.5* 11.9*  HCT 43.3 36.8* 34.0*  PLT 155 123* 123*  MCV 87.5 87.4 85.6  MCH 30.1 29.7 30.0  MCHC 34.4 34.0 35.0  RDW 14.3 14.6 14.5  LYMPHSABS 0.4*  --  0.8  MONOABS 1.2*  --  1.3*  EOSABS 0.0  --  0.0  BASOSABS 0.0  --  0.0    Recent Labs  Lab 12/26/23 1634 12/26/23 1637 12/26/23 1700 12/26/23 2312 12/27/23 1236 12/28/23 0254  NA  --  132*  --   --  131* 127*  K  --  3.4*  --   --  3.8 3.6  CL  --  97*  --   --  98 93*  CO2  --  25  --   --  24 23  ANIONGAP  --  10  --   --  9 11  GLUCOSE  --  121*  --   --  109* 109*  BUN  --  13  --   --  10 12  CREATININE  --  1.02  --   --  0.91 0.91  AST  --  57*  --   --  64*  --   ALT  --  24  --   --  30  --   ALKPHOS  --  62  --   --  52  --    BILITOT  --  0.8  --   --  0.8  --   ALBUMIN  --  3.2*  --   --  2.4*  --   PROCALCITON  --   --   --  8.62  --   --   LATICACIDVEN  --   --  1.9  --   --   --   INR  --  1.1  --   --   --   --   TSH 1.815  --   --   --   --   --   AMMONIA  --   --   --  <13  --   --   MG  --   --   --  1.4* 1.7  --   PHOS  --   --   --  3.0 2.4*  --   CALCIUM   --  9.0  --   --  8.0* 7.8*      Recent Labs  Lab 12/26/23 1634 12/26/23 1637 12/26/23 1700 12/26/23 2312 12/27/23 1236 12/28/23 0254  PROCALCITON  --   --   --  8.62  --   --   LATICACIDVEN  --   --  1.9  --   --   --   INR  --  1.1  --   --   --   --   TSH 1.815  --   --   --   --   --   AMMONIA  --   --   --  <13  --   --   MG  --   --   --  1.4* 1.7  --   CALCIUM   --  9.0  --   --  8.0* 7.8*    --------------------------------------------------------------------------------------------------------------- Lab Results  Component Value Date   CHOL 204 (H) 08/28/2023   HDL 72.60 08/28/2023   LDLCALC 82 08/28/2023   LDLDIRECT 122.0 07/18/2021   TRIG 248.0 (H) 08/28/2023   CHOLHDL 3 08/28/2023    Lab Results  Component Value Date   HGBA1C 5.7 08/28/2023   Recent Labs    12/26/23 1634  TSH 1.815   No results for input(s): VITAMINB12, FOLATE, FERRITIN, TIBC, IRON, RETICCTPCT in the last 72  hours. ------------------------------------------------------------------------------------------------------------------ Cardiac Enzymes No results for input(s): CKMB, TROPONINI, MYOGLOBIN in the last 168 hours.  Invalid input(s): CK  Micro Results Recent Results (from the past 240 hours)  Culture, blood (Routine x 2)     Status: None (Preliminary result)   Collection Time: 12/26/23  4:37 PM   Specimen: BLOOD  Result Value Ref Range Status   Specimen Description BLOOD RIGHT ANTECUBITAL  Final   Special Requests   Final    BOTTLES DRAWN AEROBIC AND ANAEROBIC Blood Culture results may not be optimal due to an  inadequate volume of blood received in culture bottles   Culture  Setup Time   Final    GRAM POSITIVE COCCI IN CHAINS IN BOTH AEROBIC AND ANAEROBIC BOTTLES CRITICAL RESULT CALLED TO, READ BACK BY AND VERIFIED WITH: PHARMD ELIZABETH MARTIN [id number] AT 1006 AM BY CM Performed at [facility], [address] [place], [place] [address]    Culture GRAM POSITIVE COCCI  Final   Report Status PENDING  Incomplete  Blood Culture ID Panel (Reflexed)     Status: Abnormal   Collection Time: 12/26/23  4:37 PM  Result Value Ref Range Status   Enterococcus faecalis NOT DETECTED NOT DETECTED Final   Enterococcus Faecium NOT DETECTED NOT DETECTED Final   Listeria monocytogenes NOT DETECTED NOT DETECTED Final   Staphylococcus species NOT DETECTED NOT DETECTED Final   Staphylococcus aureus (BCID) NOT DETECTED NOT DETECTED Final   Staphylococcus epidermidis NOT DETECTED NOT DETECTED Final   Staphylococcus lugdunensis NOT DETECTED NOT DETECTED Final   Streptococcus species DETECTED (A) NOT DETECTED Final    Comment: CRITICAL RESULT CALLED TO, READ BACK BY AND VERIFIED WITH: PHARMD E MARTIN [id number] AT 1008 AM BY CM    Streptococcus agalactiae NOT DETECTED NOT DETECTED Final   Streptococcus pneumoniae NOT DETECTED NOT DETECTED Final   Streptococcus pyogenes DETECTED (A) NOT DETECTED Final    Comment: CRITICAL RESULT CALLED TO, READ BACK BY AND VERIFIED WITH: PHARMD E MARTIN [id number] AT 1008 AM BY CM    A.calcoaceticus-baumannii NOT DETECTED NOT DETECTED Final   Bacteroides fragilis NOT DETECTED NOT DETECTED Final   Enterobacterales NOT DETECTED NOT DETECTED Final   Enterobacter cloacae complex NOT DETECTED NOT DETECTED Final   Escherichia coli NOT DETECTED NOT DETECTED Final   Klebsiella aerogenes NOT DETECTED NOT DETECTED Final   Klebsiella oxytoca NOT DETECTED NOT DETECTED Final   Klebsiella pneumoniae NOT DETECTED NOT DETECTED Final   Proteus species NOT DETECTED NOT DETECTED Final   Salmonella  species NOT DETECTED NOT DETECTED Final   Serratia marcescens NOT DETECTED NOT DETECTED Final   Haemophilus influenzae NOT DETECTED NOT DETECTED Final   Neisseria meningitidis NOT DETECTED NOT DETECTED Final   Pseudomonas aeruginosa NOT DETECTED NOT DETECTED Final   Stenotrophomonas maltophilia NOT DETECTED NOT DETECTED Final   Candida albicans NOT DETECTED NOT DETECTED Final   Candida auris NOT DETECTED NOT DETECTED Final   Candida glabrata NOT DETECTED NOT DETECTED Final   Candida krusei NOT DETECTED NOT DETECTED Final   Candida parapsilosis NOT DETECTED NOT DETECTED Final   Candida tropicalis NOT DETECTED NOT DETECTED Final   Cryptococcus neoformans/gattii NOT DETECTED NOT DETECTED Final    Comment: Performed at [facility], [address] [place], [place] [address]  Culture, blood (Routine x 2)     Status: None (Preliminary result)   Collection Time: 12/26/23  5:32 PM   Specimen: BLOOD  Result Value Ref Range Status   Specimen  Description BLOOD LEFT ANTECUBITAL  Final   Special Requests   Final    BOTTLES DRAWN AEROBIC AND ANAEROBIC Blood Culture results may not be optimal due to an inadequate volume of blood received in culture bottles   Culture  Setup Time   Final    GRAM POSITIVE COCCI IN CHAINS IN BOTH AEROBIC AND ANAEROBIC BOTTLES CRITICAL VALUE NOTED.  VALUE IS CONSISTENT WITH PREVIOUSLY REPORTED AND CALLED VALUE. Performed at [facility], [address] [place], [place] [address]    Culture GRAM POSITIVE COCCI  Final   Report Status PENDING  Incomplete  Resp panel by RT-PCR (RSV, Flu A&B, Covid) Anterior Nasal Swab     Status: None   Collection Time: 12/26/23  5:40 PM   Specimen: Anterior Nasal Swab  Result Value Ref Range Status   SARS Coronavirus 2 by RT PCR NEGATIVE NEGATIVE Final   Influenza A by PCR NEGATIVE NEGATIVE Final   Influenza B by PCR NEGATIVE NEGATIVE Final    Comment: (NOTE) The Xpert Xpress SARS-CoV-2/FLU/RSV plus assay is intended as  an aid in the diagnosis of influenza from Nasopharyngeal swab specimens and should not be used as a sole basis for treatment. Nasal washings and aspirates are unacceptable for Xpert Xpress SARS-CoV-2/FLU/RSV testing.  Fact Sheet for Patients: bloggercourse.com  Fact Sheet for Healthcare Providers: seriousbroker.it  This test is not yet approved or cleared by the [place]  FDA and has been authorized for detection and/or diagnosis of SARS-CoV-2 by FDA under an Emergency Use Authorization (EUA). This EUA will remain in effect (meaning this test can be used) for the duration of the COVID-19 declaration under Section 564(b)(1) of the Act, 21 U.S.C. section 360bbb-3(b)(1), unless the authorization is terminated or revoked.     Resp Syncytial Virus by PCR NEGATIVE NEGATIVE Final    Comment: (NOTE) Fact Sheet for Patients: bloggercourse.com  Fact Sheet for Healthcare Providers: seriousbroker.it  This test is not yet approved or cleared by the [place]  FDA and has been authorized for detection and/or diagnosis of SARS-CoV-2 by FDA under an Emergency Use Authorization (EUA). This EUA will remain in effect (meaning this test can be used) for the duration of the COVID-19 declaration under Section 564(b)(1) of the Act, 21 U.S.C. section 360bbb-3(b)(1), unless the authorization is terminated or revoked.  Performed at [facility], [address], [place], [place] [address]   MRSA Next Gen by PCR, Nasal     Status: None   Collection Time: 12/27/23  3:28 PM   Specimen: Nasal Mucosa; Nasal Swab  Result Value Ref Range Status   MRSA by PCR Next Gen NOT DETECTED NOT DETECTED Final    Comment: (NOTE) The GeneXpert MRSA Assay (FDA approved for NASAL specimens only), is one component of a comprehensive MRSA colonization surveillance program. It is not intended to diagnose MRSA  infection nor to guide or monitor treatment for MRSA infections. Test performance is not FDA approved in patients less than 43 years old. Performed at [facility], [address] [place], [place] [address]     Radiology Report DG Shoulder Left Portable Result Date: 12/27/2023 EXAM: 1 VIEW(S) XRAY OF THE LEFT SHOULDER 12/27/2023 09:25:00 AM COMPARISON: None available. CLINICAL HISTORY: pain pain FINDINGS: BONES AND JOINTS: Mild degenerative changes are seen involving the left acromioclavicular and glenohumeral joints. No acute fracture or dislocation. SOFT TISSUES: No abnormal calcifications. Visualized lung is unremarkable. IMPRESSION: 1. Mild left acromioclavicular and glenohumeral osteoarthritis. Electronically signed by: Lynwood  Green MD 12/27/2023 09:43 AM EST RP Workstation: [id number]   CT CHEST ABDOMEN PELVIS W CONTRAST Result Date: 12/26/2023 CLINICAL DATA:  Trauma. EXAM: CT CHEST, ABDOMEN, AND PELVIS WITH CONTRAST TECHNIQUE: Multidetector CT imaging of the chest, abdomen and pelvis was performed following the standard protocol during bolus administration of intravenous contrast. RADIATION DOSE REDUCTION: This exam was performed according to the departmental dose-optimization program which includes automated exposure control, adjustment of the mA and/or kV according to patient size and/or use of iterative reconstruction technique. CONTRAST:  75mL OMNIPAQUE  IOHEXOL  350 MG/ML SOLN COMPARISON:  CT chest abdomen and pelvis 12/08/2021. FINDINGS: CT CHEST FINDINGS Cardiovascular: Heart is mildly enlarged. There is no pericardial effusion. Aorta is normal in size. There are atherosclerotic calcifications of the aorta and coronary arteries. Mediastinum/Nodes: No enlarged mediastinal, hilar, or axillary lymph nodes. Thyroid  gland, trachea, and esophagus demonstrate no significant findings. Lungs/Pleura: There is dependent atelectasis in the bilateral lower lobes. There are patchy ground-glass  opacities in the right upper lobe, likely infectious/inflammatory. There is no pleural effusion or pneumothorax. Musculoskeletal: No acute fractures are seen. No focal body wall hematoma. CT ABDOMEN PELVIS FINDINGS Hepatobiliary: No hepatic injury or perihepatic hematoma. Gallbladder is unremarkable. Pancreas: Unremarkable. No pancreatic ductal dilatation or surrounding inflammatory changes. Spleen: No splenic injury or perisplenic hematoma. Adrenals/Urinary Tract: There is a 16 mm cyst in the left kidney. Otherwise, the kidneys, adrenal glands and bladder are within normal limits. Stomach/Bowel: Stomach is within normal limits. No evidence of bowel wall thickening, distention, or inflammatory changes. There is diffuse colonic diverticulosis. The appendix is not seen. Vascular/Lymphatic: The prostate is mildly enlarged. Reproductive: Uterus and bilateral adnexa are unremarkable. Other: No abdominal wall hernia or abnormality. No abdominopelvic ascites. Musculoskeletal: No acute fractures are seen. IMPRESSION: 1. No acute posttraumatic sequelae in the chest, abdomen or pelvis. 2. Patchy ground-glass opacities in the right upper lobe, likely infectious/inflammatory. 3. Colonic diverticulosis. 4. Mild cardiomegaly. 5. Aortic atherosclerosis. Aortic Atherosclerosis (ICD10-I70.0). Electronically Signed   By: Greig Pique M.D.   On: 12/26/2023 20:14   CT Maxillofacial Wo Contrast Result Date: 12/26/2023 CLINICAL DATA:  Status post fall with altered mental status. EXAM: CT MAXILLOFACIAL W/O CM TECHNIQUE: Multidetector CT imaging of the maxillofacial structures was performed. Multiplanar CT image reconstructions were also generated. RADIATION DOSE REDUCTION: This exam was performed according to the departmental dose-optimization program which includes automated exposure control, adjustment of the mA and/or kV according to patient size and/or use of iterative reconstruction technique. COMPARISON:  None Available.  FINDINGS: Osseous: No fracture or mandibular dislocation. No destructive process. Orbits: Negative. No traumatic or inflammatory finding. Sinuses: Clear. Soft tissues: There is mild left supra orbital soft tissue swelling. Limited intracranial: No significant or unexpected finding. IMPRESSION: Mild left supra orbital soft tissue swelling without evidence of an acute fracture or dislocation. Electronically Signed   By: Suzen Dials M.D.   On: 12/26/2023 19:59   CT Head Wo Contrast Result Date: 12/26/2023 CLINICAL DATA:  Possible fall with altered mental status. EXAM: CT HEAD WITHOUT CONTRAST TECHNIQUE: Contiguous axial images were obtained from the base of the skull through the vertex without intravenous contrast. RADIATION DOSE REDUCTION: This exam was performed according to the departmental dose-optimization program which includes automated exposure control, adjustment of the mA and/or kV according to patient size and/or use of iterative reconstruction technique. COMPARISON:  None Available. FINDINGS: Brain: There is generalized cerebral atrophy with widening of the extra-axial spaces and ventricular dilatation. There are areas of decreased attenuation within the  white matter tracts of the supratentorial brain, consistent with microvascular disease changes. Vascular: Marked severity bilateral cavernous carotid artery calcification is noted. Skull: Normal. Negative for fracture or focal lesion. Sinuses/Orbits: No acute finding. Other: Mild left supra orbital soft tissue swelling is seen. IMPRESSION: 1. Generalized cerebral atrophy and microvascular disease changes of the supratentorial brain. 2. No acute intracranial abnormality. 3. Mild left supra orbital soft tissue swelling. Electronically Signed   By: Suzen Dials M.D.   On: 12/26/2023 19:58   CT Cervical Spine Wo Contrast Result Date: 12/26/2023 CLINICAL DATA:  Possible fall with altered mental status. EXAM: CT CERVICAL SPINE WITHOUT CONTRAST  TECHNIQUE: Multidetector CT imaging of the cervical spine was performed without intravenous contrast. Multiplanar CT image reconstructions were also generated. RADIATION DOSE REDUCTION: This exam was performed according to the departmental dose-optimization program which includes automated exposure control, adjustment of the mA and/or kV according to patient size and/or use of iterative reconstruction technique. COMPARISON:  November 25, 2018 FINDINGS: Alignment: There is reversal of the normal cervical spine lordosis. 2 mm retrolisthesis of the C4 vertebral body is noted on C5. Skull base and vertebrae: No acute fracture. No primary bone lesion or focal pathologic process. Soft tissues and spinal canal: No prevertebral fluid or swelling. No visible canal hematoma. Disc levels: Marked severity endplate sclerosis, anterior osteophyte formation and posterior bony spurring are seen throughout all levels of the cervical spine. Marked severity multilevel intervertebral disc space narrowing is also present throughout the cervical spine. Bilateral marked severity multilevel facet joint hypertrophy is noted. Upper chest: There is mild biapical linear scarring and/or atelectasis. Other: None. IMPRESSION: 1. No acute cervical spine fracture. 2. Marked severity multilevel degenerative changes throughout the cervical spine. Electronically Signed   By: Suzen Dials M.D.   On: 12/26/2023 19:56   DG Chest Port 1 View Result Date: 12/26/2023 CLINICAL DATA:  Questionable sepsis - evaluate for abnormality EXAM: DG CHEST 1V PORT COMPARISON:  December 08, 2021 FINDINGS: Lower lung volumes. Elevation of the right hemidiaphragm. No focal airspace consolidation, pleural effusion, or pneumothorax. No cardiomegaly.Tortuous aorta with aortic atherosclerosis.No acute fracture or destructive lesion. Multilevel thoracic osteophytosis. IMPRESSION: Low lung volumes.  Otherwise, no acute cardiopulmonary abnormality. Electronically Signed    By: Rogelia Myers M.D.   On: 12/26/2023 19:07   DG Femur Min 2 Views Right Result Date: 12/26/2023 CLINICAL DATA:  Questionable sepsis - evaluate for abnormality, fall EXAM: RIGHT FEMUR 2 VIEWS COMPARISON:  None Available. FINDINGS: No acute fracture or dislocation. Small knee joint effusion. There is no evidence of arthropathy or other focal bone abnormality. Peripheral vascular atherosclerosis. IMPRESSION: No acute fracture or dislocation. Electronically Signed   By: Rogelia Myers M.D.   On: 12/26/2023 19:06   DG Forearm Left Result Date: 12/26/2023 CLINICAL DATA:  fall EXAM: LEFT FOREARM - 2 VIEW COMPARISON:  None Available. FINDINGS: No acute fracture or dislocation. There is no evidence of arthropathy or other focal bone abnormality. Soft tissues are unremarkable. IMPRESSION: No acute fracture or dislocation. Electronically Signed   By: Rogelia Myers M.D.   On: 12/26/2023 19:05     Signature  -   Lavada Stank M.D on 12/28/2023 at 9:32 AM   -  To page go to [email or web address]

## 2023-12-28 NOTE — [place] FL2 (Signed)
 [place]  MEDICAID FL2 LEVEL OF CARE FORM     IDENTIFICATION  Patient Name: Jose [name] Birthdate: February 17, 1935 Sex: male Admission Date (Current Location): 12/26/2023  [facility] and [place] Number:  Producer, Television/film/video and Address:  The . [facility], [address], [place], [place] [address]      Provider Number: [id number]  Attending Physician Name and Address:  Dennise Lavada POUR, MD  Relative Name and Phone Number:  Curtis,[name] (Significant other)  [phone] (Mobile)    Current Level of Care: Hospital Recommended Level of Care: Skilled Nursing Facility Prior Approval Number:    Date Approved/Denied:   PASRR Number: [id number]  Discharge Plan: SNF    Current Diagnoses: Patient Active Problem List   Diagnosis Date Noted   CAP (community acquired pneumonia) 12/26/2023   Rhabdomyolysis 12/26/2023   ETOH abuse 12/26/2023   Hypokalemia 12/26/2023   Onychoschizia 08/29/2023   Encounter for general adult medical examination with abnormal findings 08/29/2023   Itching 07/02/2023   Pain of left thumb 04/17/2023   Unilateral primary osteoarthritis of first carpometacarpal joint, left hand 04/17/2023   Muscle weakness (generalized) 11/08/2022   Stiffness of right shoulder joint 11/08/2022   S/P shoulder replacement, right 10/13/2022   Arthralgia of right knee 08/23/2022   Osteoarthritis of knee 08/23/2022   Pain of both shoulder joints 08/23/2022   Malnutrition of moderate degree 12/09/2021   Sepsis (HCC) 12/08/2021   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   Hyperglycemia 12/08/2021   History of CVA (cerebrovascular accident) 12/08/2021   Hyperlipidemia 12/08/2021   Pressure injury of skin 11/21/2021   Pain in right elbow    Urinary frequency 10/26/2021   Weakness 08/23/2021   Alcohol  abuse 08/23/2021   Insomnia 07/18/2021   Rotator cuff tear arthropathy 01/04/2021   Chronic fatigue syndrome 11/19/2020   Arthritis 11/19/2020   GERD  (gastroesophageal reflux disease) 11/19/2020   Impairment of balance 11/18/2020   Acute metabolic encephalopathy 05/19/2020   Ataxia 05/18/2020   Thiamine  deficiency 05/18/2020   Neck pain 12/16/2018   Pain in thoracic spine 09/13/2018   Lumbar spondylosis 06/27/2017   Degeneration of lumbar intervertebral disc 05/21/2017   Displacement of lumbar intervertebral disc without myelopathy 12/25/2013    Orientation RESPIRATION BLADDER Height & Weight     Self, Time, Situation, Place  Normal Incontinent Weight: 155 lb (70.3 kg) Height:  5' 10 (177.8 cm)  BEHAVIORAL SYMPTOMS/MOOD NEUROLOGICAL BOWEL NUTRITION STATUS      Incontinent Diet (Please see D/C summary)  AMBULATORY STATUS COMMUNICATION OF NEEDS Skin   Extensive Assist Verbally Bruising, Skin abrasions (traumatic Left lateral knee, traumatic skin tear on arm x2, traumatic face laceration, traumatic foot abrasion)                       Personal Care Assistance Level of Assistance  Bathing, Feeding, Dressing Bathing Assistance: Maximum assistance Feeding assistance: Maximum assistance Dressing Assistance: Maximum assistance     Functional Limitations Info  Sight, Hearing, Speech Sight Info: Adequate (Bruising on Left Eye) Hearing Info: Adequate Speech Info: Adequate    SPECIAL CARE FACTORS FREQUENCY  PT (By licensed PT), OT (By licensed OT)     PT Frequency: 5x a week OT Frequency: 5x a week            Contractures      Additional Factors Info  Code Status, Allergies, Psychotropic Code Status Info: Full Allergies Info: NKA Psychotropic Info: PRN: fentaNYL  (SUBLIMAZE ) injection 12.5-50 mcg  Dose: 12.5-50  mcg  Freq: Every 2 hours PRN Route: IV  PRN Reason: severe pain (pain score 7-10)  Start: 12/27/23 0206         Current Medications (12/28/2023):  This is the current hospital active medication list Current Facility-Administered Medications  Medication Dose Route Frequency Provider Last Rate Last Admin    acetaminophen  (TYLENOL ) tablet 650 mg  650 mg Oral Q6H PRN Doutova, Anastassia, MD   650 mg at 12/28/23 1149   Or   acetaminophen  (TYLENOL ) suppository 650 mg  650 mg Rectal Q6H PRN Doutova, Anastassia, MD       albuterol  (PROVENTIL ) (2.5 MG/3ML) 0.083% nebulizer solution 2.5 mg  2.5 mg Nebulization Q2H PRN Doutova, Anastassia, MD       feeding supplement (ENSURE PLUS HIGH PROTEIN) liquid 237 mL  237 mL Oral BID BM Swayze, Ava, DO   237 mL at 12/28/23 1150   fentaNYL  (SUBLIMAZE ) injection 12.5-50 mcg  12.5-50 mcg Intravenous Q2H PRN Doutova, Anastassia, MD       folic acid  (FOLVITE ) tablet 1 mg  1 mg Oral Daily Doutova, Anastassia, MD   1 mg at 12/28/23 0908   guaiFENesin  (MUCINEX ) 12 hr tablet 600 mg  600 mg Oral BID Doutova, Anastassia, MD   600 mg at 12/28/23 0908   HYDROcodone -acetaminophen  (NORCO/VICODIN) 5-325 MG per tablet 1-2 tablet  1-2 tablet Oral Q4H PRN Doutova, Anastassia, MD   1 tablet at 12/27/23 2213   leptospermum manuka honey (MEDIHONEY) paste 1 Application  1 Application Topical Daily Swayze, Ava, DO   1 Application at 12/28/23 0909   LORazepam  (ATIVAN ) tablet 1-4 mg  1-4 mg Oral Q1H PRN Doutova, Anastassia, MD       Or   LORazepam  (ATIVAN ) injection 1-4 mg  1-4 mg Intravenous Q1H PRN Doutova, Anastassia, MD       metoprolol  tartrate (LOPRESSOR ) tablet 50 mg  50 mg Oral BID Singh, Prashant K, MD       multivitamin with minerals tablet 1 tablet  1 tablet Oral Daily Doutova, Anastassia, MD   1 tablet at 12/28/23 0908   ondansetron  (ZOFRAN ) tablet 4 mg  4 mg Oral Q6H PRN Doutova, Anastassia, MD       Or   ondansetron  (ZOFRAN ) injection 4 mg  4 mg Intravenous Q6H PRN Doutova, Anastassia, MD       pantoprazole  (PROTONIX ) EC tablet 40 mg  40 mg Oral Daily Singh, Prashant K, MD   40 mg at 12/28/23 1042   penicillin  G potassium 24 Million Units in sodium chloride  0.9 % 500 mL CONTINUOUS infusion  24 Million Units Intravenous Q24H Singh, Prashant K, MD 20.8 mL/hr at 12/28/23 1044 24  Million Units at 12/28/23 1044   thiamine  (VITAMIN B1) tablet 100 mg  100 mg Oral Daily Doutova, Anastassia, MD   100 mg at 12/28/23 0908   Or   thiamine  (VITAMIN B1) injection 100 mg  100 mg Intravenous Daily Doutova, Anastassia, MD         Discharge Medications: Please see discharge summary for a list of discharge medications.  Relevant Imaging Results:  Relevant Lab Results:   Additional Information SS# [id number], Student-Social Work

## 2023-12-28 NOTE — Evaluation (Signed)
 Occupational Therapy Evaluation Patient Details Name: Jose [name] MRN: [id number] DOB: 16-Dec-1935 Today's Date: 12/28/2023   History of Present Illness   Pt is an 88 y/o male admitted 12/3 with chills, confusion, cough and recent fall.  CT showed suspected PNA in the R upper lobe.  Pt is significant L shoulder pain, no fx's on imaging.   PMHx, R knee meniscal tear, CAP, IBS, STROKE.     Clinical Impressions Patient admitted for the diagnosis above.  PTA he apparently lives at home alone, states he was independent with ADL,iADL, walked with a RW, and has a PCA for nighttime.  Patient appears to be a poor historian, unclear what his prior level of function is.  Deficits are listed below.  Currently he is limited by L shoulder pain and a headache, needing up to Max A for ADL completion at bedlevel, and Max A for simple bed mobility.  OT will continue efforts in the acute setting, and Patient will benefit from continued inpatient follow up therapy, <3 hours/day.     If plan is discharge home, recommend the following:   A lot of help with bathing/dressing/bathroom;Two people to help with walking and/or transfers     Functional Status Assessment   Patient has had a recent decline in their functional status and demonstrates the ability to make significant improvements in function in a reasonable and predictable amount of time.     Equipment Recommendations   Wheelchair (measurements OT);Wheelchair cushion (measurements OT)     Recommendations for Other Services         Precautions/Restrictions   Precautions Precautions: Fall Recall of Precautions/Restrictions: Impaired Restrictions Weight Bearing Restrictions Per Provider Order: No     Mobility Bed Mobility Overal bed mobility: Needs Assistance Bed Mobility: Supine to Sit, Sit to Supine     Supine to sit: Max assist Sit to supine: Max assist, Total assist   General bed mobility comments: Pain to L shoulder,  unable to use it to push or pull.  Headache.    Transfers                   General transfer comment: Declined      Balance Overall balance assessment: Needs assistance Sitting-balance support: Feet supported, Single extremity supported Sitting balance-Leahy Scale: Poor   Postural control: Posterior lean, Left lateral lean                                 ADL either performed or assessed with clinical judgement   ADL Overall ADL's : Needs assistance/impaired Eating/Feeding: Minimal assistance;Bed level   Grooming: Wash/dry face;Supervision/safety;Bed level   Upper Body Bathing: Moderate assistance;Bed level   Lower Body Bathing: Maximal assistance;Bed level   Upper Body Dressing : Moderate assistance;Bed level   Lower Body Dressing: Maximal assistance;Bed level       Toileting- Clothing Manipulation and Hygiene: Total assistance;Bed level               Vision   Vision Assessment?: No apparent visual deficits     Perception Perception: Not tested       Praxis Praxis: Not tested       Pertinent Vitals/Pain Pain Assessment Pain Assessment: Faces Faces Pain Scale: Hurts whole lot Pain Location: L shoulder/humeral pain and headache Pain Descriptors / Indicators: Discomfort, Grimacing, Guarding, Sharp Pain Intervention(s): Premedicated before session     Extremity/Trunk Assessment Upper Extremity Assessment Upper Extremity Assessment:  Right hand dominant;LUE deficits/detail;RUE deficits/detail RUE Deficits / Details: rTSA in 2024, able to touch the top of his head. RUE Sensation: WNL RUE Coordination: WNL LUE: Shoulder pain with ROM LUE Sensation: WNL   Lower Extremity Assessment Lower Extremity Assessment: Defer to PT evaluation   Cervical / Trunk Assessment Cervical / Trunk Assessment: Kyphotic   Communication Communication Communication: No apparent difficulties   Cognition Arousal: Lethargic Behavior During Therapy:  Anxious, Flat affect Cognition: Cognition impaired   Orientation impairments: Situation, Time     Attention impairment (select first level of impairment): Focused attention Executive functioning impairment (select all impairments): Reasoning, Problem solving                   Following commands: Impaired Following commands impaired: Follows one step commands with increased time, Follows one step commands inconsistently     Cueing  General Comments   Cueing Techniques: Verbal cues   VSS on RA   Exercises     Shoulder Instructions      Home Living Family/patient expects to be discharged to:: Skilled nursing facility Living Arrangements: Alone                                      Prior Functioning/Environment               Mobility Comments: ? use of RW for mobility ADLs Comments: Patient stating he has an overnight PCA, but then says he was Ind with ADL and iADL.  Doubt this is accurate.  Confused    OT Problem List: Decreased strength;Decreased range of motion;Impaired balance (sitting and/or standing);Pain;Impaired UE functional use;Decreased cognition;Decreased safety awareness   OT Treatment/Interventions: Self-care/ADL training;Therapeutic activities;Balance training;DME and/or AE instruction      OT Goals(Current goals can be found in the care plan section)   Acute Rehab OT Goals Patient Stated Goal: None stated OT Goal Formulation: With patient Time For Goal Achievement: 01/11/24 Potential to Achieve Goals: Fair ADL Goals Pt Will Perform Grooming: with set-up;sitting Pt Will Perform Upper Body Bathing: with min assist;sitting Pt Will Perform Upper Body Dressing: with min assist;sitting Pt Will Perform Lower Body Dressing: with mod assist;sitting/lateral leans Pt Will Transfer to Toilet: with mod assist;stand pivot transfer;bedside commode   OT Frequency:  Min 2X/week    Co-evaluation              AM-PAC OT 6 Clicks  Daily Activity     Outcome Measure Help from another person eating meals?: A Little Help from another person taking care of personal grooming?: A Little Help from another person toileting, which includes using toliet, bedpan, or urinal?: A Lot Help from another person bathing (including washing, rinsing, drying)?: A Lot Help from another person to put on and taking off regular upper body clothing?: A Lot Help from another person to put on and taking off regular lower body clothing?: A Lot 6 Click Score: 14   End of Session Nurse Communication: Patient requests pain meds  Activity Tolerance: Patient limited by pain Patient left: in bed;with call bell/phone within reach;with bed alarm set  OT Visit Diagnosis: Unsteadiness on feet (R26.81);History of falling (Z91.81);Other symptoms and signs involving cognitive function;Pain Pain - Right/Left: Left Pain - part of body: Shoulder                Time: 1350-1408 OT Time Calculation (min): 18 min Charges:  OT General Charges $  OT Visit: 1 Visit OT Evaluation $OT Eval Moderate Complexity: 1 Mod  12/28/2023  RP, OTR/L  Acute Rehabilitation Services  Office:  [phone]   Jose [name] 12/28/2023, 2:17 PM

## 2023-12-29 ENCOUNTER — Inpatient Hospital Stay (HOSPITAL_COMMUNITY)

## 2023-12-29 DIAGNOSIS — A419 Sepsis, unspecified organism: Secondary | ICD-10-CM | POA: Diagnosis not present

## 2023-12-29 DIAGNOSIS — R6520 Severe sepsis without septic shock: Secondary | ICD-10-CM | POA: Diagnosis not present

## 2023-12-29 DIAGNOSIS — G9341 Metabolic encephalopathy: Secondary | ICD-10-CM | POA: Diagnosis not present

## 2023-12-29 LAB — CBC WITH DIFFERENTIAL/PLATELET
Abs Immature Granulocytes: 0.05 K/uL (ref 0.00–0.07)
Basophils Absolute: 0 K/uL (ref 0.0–0.1)
Basophils Relative: 0 %
Eosinophils Absolute: 0 K/uL (ref 0.0–0.5)
Eosinophils Relative: 0 %
HCT: 36.2 % — ABNORMAL LOW (ref 39.0–52.0)
Hemoglobin: 12.2 g/dL — ABNORMAL LOW (ref 13.0–17.0)
Immature Granulocytes: 1 %
Lymphocytes Relative: 8 %
Lymphs Abs: 0.8 K/uL (ref 0.7–4.0)
MCH: 28.8 pg (ref 26.0–34.0)
MCHC: 33.7 g/dL (ref 30.0–36.0)
MCV: 85.6 fL (ref 80.0–100.0)
Monocytes Absolute: 1.2 K/uL — ABNORMAL HIGH (ref 0.1–1.0)
Monocytes Relative: 13 %
Neutro Abs: 7.1 K/uL (ref 1.7–7.7)
Neutrophils Relative %: 78 %
Platelets: 149 K/uL — ABNORMAL LOW (ref 150–400)
RBC: 4.23 MIL/uL (ref 4.22–5.81)
RDW: 14.5 % (ref 11.5–15.5)
WBC: 9.1 K/uL (ref 4.0–10.5)
nRBC: 0 % (ref 0.0–0.2)

## 2023-12-29 LAB — PROCALCITONIN: Procalcitonin: 5.28 ng/mL

## 2023-12-29 LAB — COMPREHENSIVE METABOLIC PANEL WITH GFR
ALT: 37 U/L (ref 0–44)
AST: 62 U/L — ABNORMAL HIGH (ref 15–41)
Albumin: 2.4 g/dL — ABNORMAL LOW (ref 3.5–5.0)
Alkaline Phosphatase: 49 U/L (ref 38–126)
Anion gap: 8 (ref 5–15)
BUN: 14 mg/dL (ref 8–23)
CO2: 25 mmol/L (ref 22–32)
Calcium: 7.7 mg/dL — ABNORMAL LOW (ref 8.9–10.3)
Chloride: 96 mmol/L — ABNORMAL LOW (ref 98–111)
Creatinine, Ser: 0.8 mg/dL (ref 0.61–1.24)
GFR, Estimated: >60 mL/min (ref >60)
Glucose, Bld: 118 mg/dL — ABNORMAL HIGH (ref 70–99)
Potassium: 3.7 mmol/L (ref 3.5–5.1)
Sodium: 129 mmol/L — ABNORMAL LOW (ref 135–145)
Total Bilirubin: 0.5 mg/dL (ref 0.0–1.2)
Total Protein: 5.4 g/dL — ABNORMAL LOW (ref 6.5–8.1)

## 2023-12-29 LAB — C-REACTIVE PROTEIN: CRP: 15.9 mg/dL — ABNORMAL HIGH (ref <1.0)

## 2023-12-29 LAB — LEGIONELLA PNEUMOPHILA SEROGP 1 UR AG: L. pneumophila Serogp 1 Ur Ag: NEGATIVE

## 2023-12-29 LAB — CULTURE, BLOOD (ROUTINE X 2)

## 2023-12-29 LAB — CK: Total CK: 359 U/L (ref 49–397)

## 2023-12-29 LAB — PHOSPHORUS: Phosphorus: 2.4 mg/dL — ABNORMAL LOW (ref 2.5–4.6)

## 2023-12-29 LAB — T4, FREE: Free T4: 0.87 ng/dL (ref 0.61–1.12)

## 2023-12-29 LAB — MAGNESIUM: Magnesium: 2.1 mg/dL (ref 1.7–2.4)

## 2023-12-29 LAB — TSH: TSH: 3.506 u[IU]/mL (ref 0.350–4.500)

## 2023-12-29 LAB — UREA NITROGEN, URINE: Urea Nitrogen, Ur: 704 mg/dL

## 2023-12-29 MED ORDER — LACTATED RINGERS IV SOLN: INTRAVENOUS | Status: AC

## 2023-12-29 MED ORDER — DILTIAZEM HCL 25 MG/5ML IV SOLN
15.0000 mg | Freq: Once | INTRAVENOUS | Status: AC
  Administered 2023-12-29: 15 mg via INTRAVENOUS
  Filled 2023-12-29: qty 5

## 2023-12-29 MED ORDER — DILTIAZEM HCL 25 MG/5ML IV SOLN: 10.0000 mg | Freq: Three times a day (TID) | INTRAVENOUS | Status: DC | PRN

## 2023-12-29 NOTE — Consult Note (Addendum)
 Cardiology Consult:   Patient ID: Jose [name]; MRN: [id number]; DOB: 09/17/1935   Admission date: 12/26/2023  Primary Care Provider: [name], MD  Chief Complaint:  Atrial fibrillation  Patient Profile:   Jose [name] is a 88 y.o. male with chronic alcohol  abuse and prior delirium tremens who presents with new onset atrial fibrillation with rapid ventricular response. He is accompanied by his long-term girlfriend, Jose [name], per Dr. Dennise; I missed her today.  He was found down by his family on December 27, 2023, after experiencing weakness for about a week prior. At baseline, he uses a walker for mobility and has been alcohol -free for one week. His family supports his care.  He presented with acute metabolic encephalopathy and rhabdomyolysis, evaluated in the context of confusion and sepsis due to Streptococcus pyogenes bacteremia associated with left knee cellulitis.  He experienced new onset atrial fibrillation with a rapid ventricular response on December 29, 2023. An EKG at 12:30 AM showed asymptomatic atrial fibrillation, which later converted to sinus rhythm. He has weakness. No chest pain, palpitations, or shortness of breath.  His chronic medical conditions include hyponatremia with a sodium level of 129, baseline anemia with a hemoglobin of 12.2, and chronic alcohol  misuse with secondary electrolyte abnormalities. Current medications include folic acid , a multivitamin, metoprolol  tartrate, and thiamine .  Allergies:   No Known Allergies  Social History:   Social History   Socioeconomic History   Marital status: Divorced    Spouse name: Not on file   Number of children: Not on file   Years of education: Not on file   Highest education level: Not on file  Occupational History   Occupation: RETIRED/Writer  Tobacco Use   Smoking status: Former    Types: Cigarettes   Smokeless tobacco: Never   Tobacco comments:    QUIT SMOKING 40 YRS AGO   Vaping Use   Vaping status: Never Used  Substance and Sexual Activity   Alcohol  use: Yes    Comment: ONE BOTTLE WINE PER DAY   Drug use: No   Sexual activity: Not on file  Other Topics Concern   Not on file  Social History Narrative   Right handed   Lives alone/2025   Has a SO Jose [name])   Social Drivers of Corporate Investment Banker Strain: Low Risk  (09/05/2023)   Overall Financial Resource Strain (CARDIA)    Difficulty of Paying Living Expenses: Not hard at all  Food Insecurity: No Food Insecurity (09/05/2023)   Hunger Vital Sign    Worried About Running Out of Food in the Last Year: Never true    Ran Out of Food in the Last Year: Never true  Transportation Needs: No Transportation Needs (09/05/2023)   PRAPARE - Administrator, Civil Service (Medical): No    Lack of Transportation (Non-Medical): No  Physical Activity: Sufficiently Active (09/05/2023)   Exercise Vital Sign    Days of Exercise per Week: 6 days    Minutes of Exercise per Session: 60 min  Stress: No Stress Concern Present (09/05/2023)   [employer] of Occupational Health - Occupational Stress Questionnaire    Feeling of Stress: Only a little  Social Connections: Socially Isolated (09/05/2023)   Social Connection and Isolation Panel    Frequency of Communication with Friends and Family: More than three times a week    Frequency of Social Gatherings with Friends and Family: More than three times a week    Attends  Religious Services: Never    Active Member of Clubs or Organizations: No    Attends Banker Meetings: Never    Marital Status: Divorced  Catering Manager Violence: Patient Unable To Answer (09/05/2023)   Humiliation, Afraid, Rape, and Kick questionnaire    Fear of Current or Ex-Partner: Patient unable to answer    Emotionally Abused: Patient unable to answer    Physically Abused: Patient unable to answer    Sexually Abused: Patient unable to answer    Family History:   The  patient's family history includes Heart disease in his father and mother.    ROS:  Please see the history of present illness.   Physical Exam/Data:   Vitals:   12/29/23 1149 12/29/23 1150 12/29/23 1151 12/29/23 1200  BP:    114/71  Pulse:      Resp: 19 (!) 23 (!) 21 (!) 23  Temp:      TempSrc:      SpO2:      Weight:      Height:        Intake/Output Summary (Last 24 hours) at 12/29/2023 1257 Last data filed at 12/29/2023 1200 Gross per 24 hour  Intake 1134.22 ml  Output 1800 ml  Net -665.78 ml   Filed Weights   12/26/23 1635  Weight: 70.3 kg   Body mass index is 22.24 kg/m.   Gen: no distress, Difficult to rouse   Neck: No JVD,  Cardiac: No Rubs or Gallops, no Murmur, RRR Respiratory: Clear to auscultation bilaterally, normal effort, normal  respiratory rate GI: Soft, nontender, non-distended  MS: No  edema;  moves all extremities Integument: Skin feels warm   Relevant CV Studies:  Cardiac Studies & Procedures   ______________________________________________________________________________________________     ECHOCARDIOGRAM  ECHOCARDIOGRAM COMPLETE 11/15/2021  Narrative ECHOCARDIOGRAM REPORT    Patient Name:   Jose [name] Date of Exam: 11/15/2021 Medical Rec #:  [id number]             Height:       64.0 in Accession #:    [id number]            Weight:       157.0 lb Date of Birth:  1935-06-05              BSA:          1.765 m Patient Age:    86 years              BP:           143/89 mmHg Patient Gender: M                     HR:           120 bpm. Exam Location:  Inpatient  Procedure: 2D Echo, Cardiac Doppler and Color Doppler  Indications:    Stroke I63.9  History:        Patient has no prior history of Echocardiogram examinations.  Sonographer:    Lauraine Pilot RDCS Referring Phys: [id number] MARSA NOVAK MELVIN  IMPRESSIONS   1. Left ventricular ejection fraction, by estimation, is 65 to 70%. The left ventricle has normal  function. The left ventricle has no regional wall motion abnormalities. Indeterminate diastolic filling due to E-A fusion. 2. Right ventricular systolic function is normal. The right ventricular size is normal. There is normal pulmonary artery systolic pressure. The estimated right ventricular systolic pressure is 24.3 mmHg. 3. The mitral valve  is grossly normal. Trivial mitral valve regurgitation. No evidence of mitral stenosis. 4. The aortic valve is tricuspid. There is mild calcification of the aortic valve. There is mild thickening of the aortic valve. Aortic valve regurgitation is not visualized. Aortic valve sclerosis is present, with no evidence of aortic valve stenosis. 5. The inferior vena cava is normal in size with greater than 50% respiratory variability, suggesting right atrial pressure of 3 mmHg.  Conclusion(s)/Recommendation(s): No intracardiac source of embolism detected on this transthoracic study. Consider a transesophageal echocardiogram to exclude cardiac source of embolism if clinically indicated.  FINDINGS Left Ventricle: Left ventricular ejection fraction, by estimation, is 65 to 70%. The left ventricle has normal function. The left ventricle has no regional wall motion abnormalities. The left ventricular internal cavity size was normal in size. There is no left ventricular hypertrophy. Indeterminate diastolic filling due to E-A fusion.  Right Ventricle: The right ventricular size is normal. No increase in right ventricular wall thickness. Right ventricular systolic function is normal. There is normal pulmonary artery systolic pressure. The tricuspid regurgitant velocity is 2.31 m/s, and with an assumed right atrial pressure of 3 mmHg, the estimated right ventricular systolic pressure is 24.3 mmHg.  Left Atrium: Left atrial size was normal in size.  Right Atrium: Right atrial size was normal in size.  Pericardium: There is no evidence of pericardial effusion.  Mitral  Valve: The mitral valve is grossly normal. Trivial mitral valve regurgitation. No evidence of mitral valve stenosis.  Tricuspid Valve: The tricuspid valve is grossly normal. Tricuspid valve regurgitation is mild . No evidence of tricuspid stenosis.  Aortic Valve: The aortic valve is tricuspid. There is mild calcification of the aortic valve. There is mild thickening of the aortic valve. Aortic valve regurgitation is not visualized. Aortic valve sclerosis is present, with no evidence of aortic valve stenosis.  Pulmonic Valve: The pulmonic valve was grossly normal. Pulmonic valve regurgitation is trivial. No evidence of pulmonic stenosis.  Aorta: The aortic root and ascending aorta are structurally normal, with no evidence of dilitation.  Venous: The inferior vena cava is normal in size with greater than 50% respiratory variability, suggesting right atrial pressure of 3 mmHg.  IAS/Shunts: The atrial septum is grossly normal.   LEFT VENTRICLE PLAX 2D LVIDd:         3.70 cm LVIDs:         2.30 cm LV PW:         0.90 cm LV IVS:        1.10 cm LVOT diam:     2.00 cm LV SV:         26 LV SV Index:   15 LVOT Area:     3.14 cm  LV Volumes (MOD) LV vol d, MOD A2C: 57.8 ml LV vol d, MOD A4C: 70.0 ml LV vol s, MOD A2C: 23.1 ml LV vol s, MOD A4C: 27.8 ml LV SV MOD A2C:     34.7 ml LV SV MOD A4C:     70.0 ml LV SV MOD BP:      36.6 ml  RIGHT VENTRICLE RV S prime:     13.20 cm/s TAPSE (M-mode): 2.3 cm  LEFT ATRIUM             Index        RIGHT ATRIUM           Index LA diam:        2.40 cm 1.36 cm/m   RA Area:  13.40 cm LA Vol (A2C):   19.4 ml 10.99 ml/m  RA Volume:   28.30 ml  16.03 ml/m LA Vol (A4C):   24.8 ml 14.05 ml/m LA Biplane Vol: 24.1 ml 13.65 ml/m AORTIC VALVE LVOT Vmax:   69.47 cm/s LVOT Vmean:  44.500 cm/s LVOT VTI:    0.084 m  AORTA Ao Root diam: 3.50 cm Ao Asc diam:  3.50 cm  TRICUSPID VALVE TR Peak grad:   21.3 mmHg TR Vmax:        231.00  cm/s  SHUNTS Systemic VTI:  0.08 m Systemic Diam: 2.00 cm  Darryle Decent MD Electronically signed by Darryle Decent MD Signature Date/Time: 11/15/2021/9:28:56 AM    Final          ______________________________________________________________________________________________      Laboratory Data:  Chemistry Recent Labs  Lab 12/28/23 0254 12/29/23 0341  NA 127* 129*  K 3.6 3.7  CL 93* 96*  CO2 23 25  GLUCOSE 109* 118*  BUN 12 14  CREATININE 0.91 0.80  CALCIUM  7.8* 7.7*  GFRNONAA >60 >60  ANIONGAP 11 8    Recent Labs  Lab 12/27/23 1236 12/29/23 0341  PROT 5.2* 5.4*  ALBUMIN 2.4* 2.4*  AST 64* 62*  ALT 30 37  ALKPHOS 52 49  BILITOT 0.8 0.5   Hematology Recent Labs  Lab 12/28/23 0254 12/29/23 0341  WBC 9.2 9.1  RBC 3.97* 4.23  HGB 11.9* 12.2*  HCT 34.0* 36.2*  MCV 85.6 85.6  MCH 30.0 28.8  MCHC 35.0 33.7  RDW 14.5 14.5  PLT 123* 149*    RADIOLOGY CT Chest, Abdomen, Pelvis: Aortic atherosclerosis, ground glass opacities in upper lung fields, coronary artery calcifications in LAD and diagonal arteries, femoral artery calcifications (12/27/2023)  DIAGNOSTIC EKG: Sinus rhythm (12/29/2023); AF on AM EKG, AF 12 AM to 4 AM on telemetry  Assessment and Plan:   Paroxysmal atrial fibrillation - New onset atrial fibrillation with rapid ventricular response on December 6th, 2025, converted to sinus rhythm by 4:11 AM. High CHADS-VASc score 6 due to age over 82, hypertension, and coronary artery calcifications and stroke. High risk for anticoagulation due to fall, chronic alcohol  misuse, and electrolyte abnormalities.  - Pending echocardiogram - Recommended telemetry - If paroxysms of atrial fibrillation continue, will initiate anticoagulation (eliquis 5 mg PO BID) - If no further atrial fibrillation, will consider sending home on a heart monitor to assess for atrial fibrillation and confirm alcohol  abstinence and fall prevention; patient would be at  elevated risk for fall (found down)  Atherosclerotic cardiovascular disease Aortic atherosclerosis and coronary artery calcifications in the LAD and diagonal arteries.  - Ordered echocardiogram  Alcohol  use disorder with prior withdrawal delirium Chronic alcohol  misuse with prior withdrawal delirium. Currently alcohol -free for one week. High risk for anticoagulation due to alcohol  misuse and electrolyte abnormalities. - managed by [facility]  Chronic hyponatremia - Sodium level of 129. Suspect alcohol  related, on vitamins and managed as per [facility]  Bacteremia - as per primary    For questions or updates, please contact [facility] Please consult [email or web address] for contact info under Cardiology/STEMI.   Stanly Leavens, MD FASE [facility] Cardiologist [facility]  [address] [place], [address] [place], [place] [address] [phone]  12:57 PM

## 2023-12-29 NOTE — Progress Notes (Signed)
 Patient remains in atrial fibrillation rate 115-120's blood pressure 90/64. Dr. Charlton notified.No new orders received.Will continue with current plan.

## 2023-12-29 NOTE — Progress Notes (Signed)
 PROGRESS NOTE     Patient Demographics:    Jose [name], is a 88 y.o. male, DOB - [birth year], FMW:[id number]  Outpatient Primary MD for the patient is [name], MD    LOS - 3  Admit date - 12/26/2023    Chief Complaint  Patient presents with   Altered Mental Status       Brief Narrative (HPI from H&P)    88 yr old man who presented to [facility] ED on 12/26/2023 with complaints of chills and confusion. The patient had been feeling weak and fell about a week ago injuring his face. He has also had a cough. He was found down by his family. They state that he usually gets around fine with his walker. Family states that he has not had anything to drink for the past week.   PMHX positive for chronic fatigue, GERD, HLD, irritable spinal stenosis, CVA, daily alcohol  use (DT's a few years ago).   In the ED he was found to have a Right upper lobe infiltrate on X-ray with fever, tachypnea, and leukocytosis.  Further workup suggested he had strep pyogenes bacteremia likely due to community-acquired pneumonia and was admitted to the hospital.   Subjective:    Roe Magyar today has, No headache, No chest pain, No abdominal pain - No Nausea, No new weakness tingling or numbness, no shortness of breath however he is quite confused.   Assessment  & Plan :   Sepsis with strep pyogenes bacteremia likely from community-acquired pneumonia along with left knee cellulitis  PT chest noted, no other subjective complaints although he is quite confused and a poor historian, ID has placed him on penicillin  G which will be continued, monitor inflammatory markers, currently afebrile and not toxic, sepsis pathophysiology improving.   Speech eval as well and monitor clinically.   Acute Metabolic Encephalopathy, questionable DTs  Likely hospital-acquired acquired encephalopathy on top of underlying age-related cognitive decline, at risk for delirium, minimize narcotics and benzodiazepines and monitor.  He does take benzodiazepines at home hence on low-dose here, also question of DTs, being monitored on CIWA  protocol.   Cellulitis  There is an area of ulceration, ,erythema, and calor on the lateral aspect of the left knee. He is receiving IV penicillin . Monitor. Will screen for MRSA.   Hyperlipidemia  Not on statin.   Chronic fatigue syndrome  Hold Ritalin    Rhabdomyolysis  Improved with supportive care.  Hyponatremia.  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  Likely due to combination of pneumonia, bacteremia and some intravascular dehydration, IV fluids, continue beta-blocker, as needed diltiazem , telemetry monitor, TSH stable, check echo, hydrate with IV fluids, Chad vas 2 score is greater than 3 however he is an alcoholic with falls, cardiology will decide if he as a safe candidate for anticoagulation.  Cardiology consulted.      Condition -   Guarded  Family Communication  : Called girlfriend [phone]  on 12/28/2023 at 9:30 AM and message left, updated bedside 12/28/2023  Code Status : Full code  Consults  : None  PUD Prophylaxis : PPI.   Procedures  :            Disposition Plan  :    Status is: Inpatient   DVT Prophylaxis  :    SCDs Start: 12/27/23 9166   Lab Results  Component Value Date   PLT 149 (L) 12/29/2023    Diet :  Diet Order             Diet Heart Room service appropriate? Yes; Fluid consistency: Thin; Fluid restriction: 1500  mL Fluid  Diet effective now                    Inpatient Medications  Scheduled Meds:  feeding supplement  237 mL Oral BID BM   folic acid   1 mg Oral Daily   guaiFENesin   600 mg Oral BID   leptospermum manuka honey  1 Application Topical Daily   metoprolol  tartrate  50 mg Oral BID   multivitamin with minerals  1 tablet Oral Daily   pantoprazole   40 mg Oral Daily   thiamine   100 mg Oral Daily   Or   thiamine   100 mg Intravenous Daily   Continuous Infusions:  lactated ringers      penicillin  G potassium 24 Million Units in sodium chloride  0.9 % 500 mL CONTINUOUS infusion 24 Million Units (12/28/23 1044)   PRN Meds:.acetaminophen  **OR** acetaminophen , albuterol , diltiazem , HYDROcodone -acetaminophen , LORazepam  **OR** LORazepam , ondansetron  **OR** ondansetron  (ZOFRAN ) IV    Objective:   Vitals:   12/29/23 0143 12/29/23 0315 12/29/23 0400 12/29/23 0827  BP: 112/80 90/64 (!) 103/51 111/61  Pulse:  (!) 118 (!) 128 89  Resp: 20 20 20 16   Temp: 98 F (36.7 C) 98.2 F (36.8 C) 98.5 F (36.9 C) 97.9 F (36.6 C)  TempSrc: Oral Oral Oral Oral  SpO2: 93% 92% 94%   Weight:      Height:        Wt Readings from Last 3 Encounters:  12/26/23 70.3 kg  10/16/23 70.6 kg  09/05/23 71.7 kg     Intake/Output Summary (Last 24 hours) at 12/29/2023 0841 Last data filed at 12/29/2023 0600 Gross per 24 hour  Intake 1010.37 ml  Output 3400 ml  Net -2389.63 ml     Physical Exam  Awake but  pleasantly confused, no new F.N deficits,   [place].AT,PERRAL Supple Neck, No JVD,   Symmetrical Chest wall movement, Good air movement bilaterally, CTAB RRR,No Gallops,Rubs or new Murmurs,  +ve B.Sounds, Abd Soft, No tenderness,   No Cyanosis, Clubbing or edema       Data Review:    Recent Labs  Lab 12/26/23 1637 12/27/23 1236 12/28/23 0254 12/29/23 0341  WBC 12.5* 10.8* 9.2 9.1  HGB 14.9 12.5* 11.9* 12.2*  HCT 43.3 36.8* 34.0* 36.2*  PLT 155 123* 123* 149*  MCV 87.5 87.4 85.6 85.6   MCH 30.1 29.7 30.0 28.8  MCHC 34.4 34.0 35.0 33.7  RDW 14.3 14.6 14.5 14.5  LYMPHSABS 0.4*  --  0.8 0.8  MONOABS 1.2*  --  1.3* 1.2*  EOSABS 0.0  --  0.0 0.0  BASOSABS 0.0  --  0.0 0.0    Recent Labs  Lab 12/26/23 1634 12/26/23 1637 12/26/23 1700 12/26/23 2312 12/27/23 1236 12/28/23 0254 12/28/23 0904 12/29/23 0341  NA  --  132*  --   --  131* 127*  --  129*  K  --  3.4*  --   --  3.8 3.6  --  3.7  CL  --  97*  --   --  98 93*  --  96*  CO2  --  25  --   --  24 23  --  25  ANIONGAP  --  10  --   --  9 11  --  8  GLUCOSE  --  121*  --   --  109* 109*  --  118*  BUN  --  13  --   --  10 12  --  14  CREATININE  --  1.02  --   --  0.91 0.91  --  0.80  AST  --  57*  --   --  64*  --   --  62*  ALT  --  24  --   --  30  --   --  37  ALKPHOS  --  62  --   --  52  --   --  49  BILITOT  --  0.8  --   --  0.8  --   --  0.5  ALBUMIN  --  3.2*  --   --  2.4*  --   --  2.4*  CRP  --   --   --   --   --   --  23.6* 15.9*  PROCALCITON  --   --   --  8.62  --  7.17  --  5.28  LATICACIDVEN  --   --  1.9  --   --   --   --   --   INR  --  1.1  --   --   --   --   --   --   TSH 1.815  --   --   --   --   --   --  3.506  AMMONIA  --   --   --  <13  --   --   --   --   MG  --   --   --  1.4* 1.7  --   --  2.1  PHOS  --   --   --  3.0 2.4*  --   --  2.4*  CALCIUM   --  9.0  --   --  8.0* 7.8*  --  7.7*      Recent Labs  Lab 12/26/23 1634 12/26/23 1637 12/26/23 1700 12/26/23 2312 12/27/23 1236 12/28/23 0254 12/28/23 0904 12/29/23 0341  CRP  --   --   --   --   --   --  23.6* 15.9*  PROCALCITON  --   --   --  8.62  --  7.17  --  5.28  LATICACIDVEN  --   --  1.9  --   --   --   --   --   INR  --  1.1  --   --   --   --   --   --   TSH 1.815  --   --   --   --   --   --  3.506  AMMONIA  --   --   --  <13  --   --   --   --   MG  --   --   --  1.4* 1.7  --   --  2.1  CALCIUM   --  9.0  --   --  8.0* 7.8*  --  7.7*     --------------------------------------------------------------------------------------------------------------- Lab Results  Component Value Date   CHOL 204 (H) 08/28/2023   HDL 72.60 08/28/2023   LDLCALC 82 08/28/2023   LDLDIRECT 122.0 07/18/2021   TRIG 248.0 (H) 08/28/2023   CHOLHDL 3 08/28/2023    Lab Results  Component Value Date   HGBA1C 5.7 08/28/2023   Recent Labs    12/29/23 0341  TSH 3.506  FREET4 0.87   No results for input(s): VITAMINB12, FOLATE, FERRITIN, TIBC, IRON, RETICCTPCT in the last 72 hours. ------------------------------------------------------------------------------------------------------------------ Cardiac Enzymes No results for input(s): CKMB, TROPONINI, MYOGLOBIN in the last 168 hours.  Invalid input(s): CK  Micro Results Recent Results (from the past 240 hours)  Culture, blood (Routine x 2)     Status: Abnormal (Preliminary result)   Collection Time: 12/26/23  4:37 PM   Specimen: BLOOD  Result Value Ref Range Status   Specimen Description BLOOD RIGHT ANTECUBITAL  Final   Special Requests   Final    BOTTLES DRAWN AEROBIC AND ANAEROBIC Blood Culture results may not be optimal due to an inadequate volume of blood received in culture bottles   Culture  Setup Time   Final    GRAM POSITIVE COCCI IN CHAINS IN BOTH AEROBIC AND ANAEROBIC BOTTLES CRITICAL RESULT CALLED TO, READ BACK BY AND VERIFIED WITH: PHARMD ELIZABETH MARTIN [id number] AT 1006 AM BY CM    Culture (A)  Final    GROUP A STREP (S.PYOGENES) ISOLATED SUSCEPTIBILITIES TO FOLLOW Performed at [facility], [address] [place], [place] [address]    Report Status PENDING  Incomplete  Blood Culture ID Panel (Reflexed)     Status: Abnormal   Collection Time: 12/26/23  4:37 PM  Result Value Ref Range Status   Enterococcus faecalis NOT DETECTED NOT DETECTED Final   Enterococcus Faecium NOT DETECTED NOT DETECTED Final   Listeria monocytogenes NOT DETECTED NOT  DETECTED Final   Staphylococcus species NOT DETECTED NOT DETECTED Final   Staphylococcus aureus (BCID) NOT DETECTED NOT DETECTED Final   Staphylococcus epidermidis NOT DETECTED NOT DETECTED Final   Staphylococcus lugdunensis NOT DETECTED NOT DETECTED Final   Streptococcus species DETECTED (A) NOT DETECTED Final    Comment: CRITICAL RESULT CALLED TO, READ BACK BY AND VERIFIED WITH: PHARMD E MARTIN [id number] AT 1008 AM BY  CM    Streptococcus agalactiae NOT DETECTED NOT DETECTED Final   Streptococcus pneumoniae NOT DETECTED NOT DETECTED Final   Streptococcus pyogenes DETECTED (A) NOT DETECTED Final    Comment: CRITICAL RESULT CALLED TO, READ BACK BY AND VERIFIED WITH: PHARMD E MARTIN [id number] AT 1008 AM BY CM    A.calcoaceticus-baumannii NOT DETECTED NOT DETECTED Final   Bacteroides fragilis NOT DETECTED NOT DETECTED Final   Enterobacterales NOT DETECTED NOT DETECTED Final   Enterobacter cloacae complex NOT DETECTED NOT DETECTED Final   Escherichia coli NOT DETECTED NOT DETECTED Final   Klebsiella aerogenes NOT DETECTED NOT DETECTED Final   Klebsiella oxytoca NOT DETECTED NOT DETECTED Final   Klebsiella pneumoniae NOT DETECTED NOT DETECTED Final   Proteus species NOT DETECTED NOT DETECTED Final   Salmonella species NOT DETECTED NOT DETECTED Final   Serratia marcescens NOT DETECTED NOT DETECTED Final   Haemophilus influenzae NOT DETECTED NOT DETECTED Final   Neisseria meningitidis NOT DETECTED NOT DETECTED Final   Pseudomonas aeruginosa NOT DETECTED NOT DETECTED Final   Stenotrophomonas maltophilia NOT DETECTED NOT DETECTED Final   Candida albicans NOT DETECTED NOT DETECTED Final   Candida auris NOT DETECTED NOT DETECTED Final   Candida glabrata NOT DETECTED NOT DETECTED Final   Candida krusei NOT DETECTED NOT DETECTED Final   Candida parapsilosis NOT DETECTED NOT DETECTED Final   Candida tropicalis NOT DETECTED NOT DETECTED Final   Cryptococcus neoformans/gattii NOT DETECTED NOT DETECTED  Final    Comment: Performed at [facility], [address] [place], [place] [address]  Culture, blood (Routine x 2)     Status: None (Preliminary result)   Collection Time: 12/26/23  5:32 PM   Specimen: BLOOD  Result Value Ref Range Status   Specimen Description BLOOD LEFT ANTECUBITAL  Final   Special Requests   Final    BOTTLES DRAWN AEROBIC AND ANAEROBIC Blood Culture results may not be optimal due to an inadequate volume of blood received in culture bottles   Culture  Setup Time   Final    GRAM POSITIVE COCCI IN CHAINS IN BOTH AEROBIC AND ANAEROBIC BOTTLES CRITICAL VALUE NOTED.  VALUE IS CONSISTENT WITH PREVIOUSLY REPORTED AND CALLED VALUE.    Culture   Final    GRAM POSITIVE COCCI IDENTIFICATION TO FOLLOW Performed at [facility], [address], [place], [place] [address]    Report Status PENDING  Incomplete  Resp panel by RT-PCR (RSV, Flu A&B, Covid) Anterior Nasal Swab     Status: None   Collection Time: 12/26/23  5:40 PM   Specimen: Anterior Nasal Swab  Result Value Ref Range Status   SARS Coronavirus 2 by RT PCR NEGATIVE NEGATIVE Final   Influenza A by PCR NEGATIVE NEGATIVE Final   Influenza B by PCR NEGATIVE NEGATIVE Final    Comment: (NOTE) The Xpert Xpress SARS-CoV-2/FLU/RSV plus assay is intended as an aid in the diagnosis of influenza from Nasopharyngeal swab specimens and should not be used as a sole basis for treatment. Nasal washings and aspirates are unacceptable for Xpert Xpress SARS-CoV-2/FLU/RSV testing.  Fact Sheet for Patients: bloggercourse.com  Fact Sheet for Healthcare Providers: seriousbroker.it  This test is not yet approved or cleared by the [place]  FDA and has been authorized for detection and/or diagnosis of SARS-CoV-2 by FDA under an Emergency Use Authorization (EUA). This EUA will remain in effect (meaning this test can be used) for the duration of the COVID-19  declaration under Section 564(b)(1) of the Act,  21 U.S.C. section 360bbb-3(b)(1), unless the authorization is terminated or revoked.     Resp Syncytial Virus by PCR NEGATIVE NEGATIVE Final    Comment: (NOTE) Fact Sheet for Patients: bloggercourse.com  Fact Sheet for Healthcare Providers: seriousbroker.it  This test is not yet approved or cleared by the [place]  FDA and has been authorized for detection and/or diagnosis of SARS-CoV-2 by FDA under an Emergency Use Authorization (EUA). This EUA will remain in effect (meaning this test can be used) for the duration of the COVID-19 declaration under Section 564(b)(1) of the Act, 21 U.S.C. section 360bbb-3(b)(1), unless the authorization is terminated or revoked.  Performed at [facility], [address] [place], [place] [address]   MRSA Next Gen by PCR, Nasal     Status: None   Collection Time: 12/27/23  3:28 PM   Specimen: Nasal Mucosa; Nasal Swab  Result Value Ref Range Status   MRSA by PCR Next Gen NOT DETECTED NOT DETECTED Final    Comment: (NOTE) The GeneXpert MRSA Assay (FDA approved for NASAL specimens only), is one component of a comprehensive MRSA colonization surveillance program. It is not intended to diagnose MRSA infection nor to guide or monitor treatment for MRSA infections. Test performance is not FDA approved in patients less than 22 years old. Performed at [facility], [address] [place], [place] [address]     Radiology Report DG Shoulder Left Portable Result Date: 12/27/2023 EXAM: 1 VIEW(S) XRAY OF THE LEFT SHOULDER 12/27/2023 09:25:00 AM COMPARISON: None available. CLINICAL HISTORY: pain pain FINDINGS: BONES AND JOINTS: Mild degenerative changes are seen involving the left acromioclavicular and glenohumeral joints. No acute fracture or dislocation. SOFT TISSUES: No abnormal calcifications. Visualized lung is unremarkable. IMPRESSION: 1.  Mild left acromioclavicular and glenohumeral osteoarthritis. Electronically signed by: Lynwood Seip MD 12/27/2023 09:43 AM EST RP Workstation: [id number]     Signature  -   Lavada Stank M.D on 12/29/2023 at 8:41 AM   -  To page go to [email or web address]

## 2023-12-29 NOTE — Progress Notes (Signed)
   12/29/23 0143  Assess: MEWS Score  Temp 98 F (36.7 C)  BP 112/80  MAP (mmHg) 88  ECG Heart Rate (!) 138  Resp 20  Level of Consciousness Alert  SpO2 93 %  O2 Device Room Air  Assess: MEWS Score  MEWS Temp 0  MEWS Systolic 0  MEWS Pulse 3  MEWS RR 0  MEWS LOC 0  MEWS Score 3  MEWS Score Color Yellow  Assess: if the MEWS score is Yellow or Red  Were vital signs accurate and taken at a resting state? Yes  Does the patient meet 2 or more of the SIRS criteria? No  Does the patient have a confirmed or suspected source of infection? No  MEWS guidelines implemented  Yes, yellow  Treat  MEWS Interventions Considered administering scheduled or prn medications/treatments as ordered  Take Vital Signs  Increase Vital Sign Frequency  Yellow: Q2hr x1, continue Q4hrs until patient remains green for 12hrs  Escalate  MEWS: Escalate Yellow: Discuss with charge nurse and consider notifying provider and/or RRT  Notify: Charge Nurse/RN  Name of Charge Nurse/RN Notified Elspeth, RN  Provider Notification  Provider Name/Title Evalene Sprinkles  Date Provider Notified 12/29/23  Time Provider Notified 0155  Method of Notification Page  Notification Reason Other (Comment) (elevated heart rate ; rhythm change to atrial fib)  Provider response See new orders  Date of Provider Response 12/29/23  Time of Provider Response 0212  Assess: SIRS CRITERIA  SIRS Temperature  0  SIRS Respirations  0  SIRS Pulse 1  SIRS WBC 0  SIRS Score Sum  1   Patient converted to atrial fibrillation rate 120-140's. EKG obtained to confirm. Cardizem  15 mg IV push given as ordered.

## 2023-12-29 NOTE — Progress Notes (Signed)
 Patient converted back to sinus rhythm 12 lead EKG obtained to confirm. Will continue with current plan of care.

## 2023-12-29 NOTE — Plan of Care (Signed)

## 2023-12-30 ENCOUNTER — Inpatient Hospital Stay (HOSPITAL_COMMUNITY)

## 2023-12-30 DIAGNOSIS — R6520 Severe sepsis without septic shock: Secondary | ICD-10-CM | POA: Diagnosis not present

## 2023-12-30 DIAGNOSIS — G9341 Metabolic encephalopathy: Secondary | ICD-10-CM | POA: Diagnosis not present

## 2023-12-30 DIAGNOSIS — I480 Paroxysmal atrial fibrillation: Secondary | ICD-10-CM

## 2023-12-30 DIAGNOSIS — A419 Sepsis, unspecified organism: Secondary | ICD-10-CM | POA: Diagnosis not present

## 2023-12-30 LAB — COMPREHENSIVE METABOLIC PANEL WITH GFR
ALT: 67 U/L — ABNORMAL HIGH (ref 0–44)
AST: 81 U/L — ABNORMAL HIGH (ref 15–41)
Albumin: 2.3 g/dL — ABNORMAL LOW (ref 3.5–5.0)
Alkaline Phosphatase: 53 U/L (ref 38–126)
Anion gap: 8 (ref 5–15)
BUN: 11 mg/dL (ref 8–23)
CO2: 25 mmol/L (ref 22–32)
Calcium: 8.2 mg/dL — ABNORMAL LOW (ref 8.9–10.3)
Chloride: 102 mmol/L (ref 98–111)
Creatinine, Ser: 0.75 mg/dL (ref 0.61–1.24)
GFR, Estimated: >60 mL/min (ref >60)
Glucose, Bld: 130 mg/dL — ABNORMAL HIGH (ref 70–99)
Potassium: 3.6 mmol/L (ref 3.5–5.1)
Sodium: 135 mmol/L (ref 135–145)
Total Bilirubin: 0.8 mg/dL (ref 0.0–1.2)
Total Protein: 5.5 g/dL — ABNORMAL LOW (ref 6.5–8.1)

## 2023-12-30 LAB — LIPID PANEL
Cholesterol: 111 mg/dL (ref 0–200)
HDL: 24 mg/dL — ABNORMAL LOW (ref >40)
LDL Cholesterol: 54 mg/dL (ref 0–99)
Total CHOL/HDL Ratio: 4.6 ratio
Triglycerides: 167 mg/dL — ABNORMAL HIGH (ref <150)
VLDL: 33 mg/dL (ref 0–40)

## 2023-12-30 LAB — CBC WITH DIFFERENTIAL/PLATELET
Abs Immature Granulocytes: 0.08 K/uL — ABNORMAL HIGH (ref 0.00–0.07)
Basophils Absolute: 0 K/uL (ref 0.0–0.1)
Basophils Relative: 0 %
Eosinophils Absolute: 0 K/uL (ref 0.0–0.5)
Eosinophils Relative: 0 %
HCT: 35 % — ABNORMAL LOW (ref 39.0–52.0)
Hemoglobin: 11.8 g/dL — ABNORMAL LOW (ref 13.0–17.0)
Immature Granulocytes: 1 %
Lymphocytes Relative: 9 %
Lymphs Abs: 0.7 K/uL (ref 0.7–4.0)
MCH: 29.5 pg (ref 26.0–34.0)
MCHC: 33.7 g/dL (ref 30.0–36.0)
MCV: 87.5 fL (ref 80.0–100.0)
Monocytes Absolute: 1 K/uL (ref 0.1–1.0)
Monocytes Relative: 14 %
Neutro Abs: 5.3 K/uL (ref 1.7–7.7)
Neutrophils Relative %: 76 %
Platelets: 196 K/uL (ref 150–400)
RBC: 4 MIL/uL — ABNORMAL LOW (ref 4.22–5.81)
RDW: 14.6 % (ref 11.5–15.5)
WBC: 7 K/uL (ref 4.0–10.5)
nRBC: 0 % (ref 0.0–0.2)

## 2023-12-30 LAB — ECHOCARDIOGRAM COMPLETE
AR max vel: 2.45 cm2
AV Area VTI: 2.26 cm2
AV Area mean vel: 2.17 cm2
AV Mean grad: 2 mmHg
AV Peak grad: 2.7 mmHg
Ao pk vel: 0.81 m/s
Area-P 1/2: 3.26 cm2
Height: 70 in
S' Lateral: 2.3 cm
Weight: 2480 [oz_av]

## 2023-12-30 LAB — PROCALCITONIN: Procalcitonin: 2.19 ng/mL

## 2023-12-30 LAB — C-REACTIVE PROTEIN: CRP: 11.8 mg/dL — ABNORMAL HIGH (ref <1.0)

## 2023-12-30 LAB — PHOSPHORUS: Phosphorus: 2.6 mg/dL (ref 2.5–4.6)

## 2023-12-30 LAB — MAGNESIUM: Magnesium: 1.9 mg/dL (ref 1.7–2.4)

## 2023-12-30 NOTE — Plan of Care (Signed)

## 2023-12-30 NOTE — Progress Notes (Signed)
 PROGRESS NOTE     Patient Demographics:    Jose [name], is a 88 y.o. male, DOB - 1935-05-11, FMW:[id number]  Outpatient Primary MD for the patient is [name], MD    LOS - 4  Admit date - 12/26/2023    Chief Complaint  Patient presents with   Altered Mental Status       Brief Narrative (HPI from H&P)    88 yr old man who presented to [facility] ED on 12/26/2023 with complaints of chills and confusion. The patient had been feeling weak and fell about a week ago injuring his face. He has also had a cough. He was found down by his family. They state that he usually gets around fine with his walker. Family states that he has not had anything to drink for the past week.   PMHX positive for chronic fatigue, GERD, HLD, irritable spinal stenosis, CVA, daily alcohol  use (DT's a few years ago).   In the ED he was found to have a Right upper lobe infiltrate on X-ray with fever, tachypnea, and leukocytosis.  Further workup suggested he had strep pyogenes bacteremia likely due to community-acquired pneumonia and was admitted to the hospital.   Subjective:   Patient in bed, appears comfortable, denies any headache, no fever, no chest pain or pressure, no shortness of breath , no abdominal pain. No focal weakness.  Remains confused   Assessment  & Plan :   Sepsis with strep pyogenes bacteremia likely from community-acquired pneumonia along with multiple chronic skin breakdowns present on admission from falls.  PT chest noted, no other subjective complaints although he is quite confused and a poor historian, ID has placed him on penicillin  G which will be continued, monitor inflammatory markers, currently afebrile and not toxic,  sepsis pathophysiology improving.  Speech eval as well and monitor clinically.  Case discussed with ID Dr. Lindia in detail upon discharge will be switched to oral amoxicillin  total duration 10 days of treatment, echocardiogram pending, repeat blood cultures pending.   Acute Metabolic Encephalopathy, questionable DTs  Likely hospital-acquired acquired encephalopathy on top of  underlying age-related cognitive decline, at risk for delirium, minimize narcotics and benzodiazepines and monitor.  He does take benzodiazepines at home hence on low-dose here, also question of DTs, being monitored on CIWA protocol.   Cellulitis  There is an area of ulceration, ,erythema, and calor on the lateral aspect of the left knee. He is receiving IV penicillin . Monitor. Will screen for MRSA.   Hyperlipidemia  Not on statin.   Chronic fatigue syndrome  Hold Ritalin    Rhabdomyolysis  Improved with supportive care.  Hyponatremia.  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  Likely due to combination of pneumonia, bacteremia and some intravascular dehydration, IV fluids, continue beta-blocker, as needed diltiazem , telemetry monitor, TSH stable, check echo, hydrate with IV fluids, Chad vas 2 score is greater than 3 however he is an alcoholic with falls, cardiology will decide if he as a safe candidate for anticoagulation.  Appreciate cardiology input, echo pending, agree with withholding anticoagulation for now, discharged on 30-day monitor and monitor on telemetry here.      Condition -   Guarded  Family Communication  : Called girlfriend [phone]  on 12/28/2023 at 9:30 AM and message left, updated bedside 12/28/2023  Code Status : Full code  Consults  : None  PUD  Prophylaxis : PPI.   Procedures  :            Disposition Plan  :    Status is: Inpatient   DVT Prophylaxis  :    SCDs Start: 12/27/23 9166   Lab Results  Component Value Date   PLT 149 (L) 12/29/2023    Diet :  Diet Order             Diet Heart Room service appropriate? Yes; Fluid consistency: Thin; Fluid restriction: 1500 mL Fluid  Diet effective now                    Inpatient Medications  Scheduled Meds:  feeding supplement  237 mL Oral BID BM   folic acid   1 mg Oral Daily   guaiFENesin   600 mg Oral BID   leptospermum manuka honey  1 Application Topical Daily   metoprolol  tartrate  50 mg Oral BID   multivitamin with minerals  1 tablet Oral Daily   pantoprazole   40 mg Oral Daily   thiamine   100 mg Oral Daily   Or   thiamine   100 mg Intravenous Daily   Continuous Infusions:  penicillin  G potassium 24 Million Units in sodium chloride  0.9 % 500 mL CONTINUOUS infusion 24 Million Units (12/30/23 0940)   PRN Meds:.acetaminophen  **OR** acetaminophen , albuterol , diltiazem , HYDROcodone -acetaminophen , ondansetron  **OR** ondansetron  (ZOFRAN ) IV    Objective:   Vitals:   12/29/23 1943 12/30/23 0021 12/30/23 0400 12/30/23 0926  BP: 130/74 139/80 (!) 139/98 130/88  Pulse: 93 83 87 95  Resp: 18 17 14 19   Temp: (!) 97.4 F (36.3 C) (!) 97.4 F (36.3 C) (!) 97.5 F (36.4 C) 97.6 F (36.4 C)  TempSrc: Oral Axillary Oral Axillary  SpO2: 94% 94% 94%   Weight:      Height:        Wt Readings from Last 3 Encounters:  12/26/23 70.3 kg  10/16/23 70.6 kg  09/05/23 71.7 kg  Intake/Output Summary (Last 24 hours) at 12/30/2023 0955 Last data filed at 12/29/2023 1911 Gross per 24 hour  Intake 1544.68 ml  Output 700 ml  Net 844.68 ml     Physical Exam  Awake but pleasantly confused, no new F.N deficits,   [place]ornick.AT,PERRAL Supple Neck, No JVD,   Symmetrical Chest wall movement, Good air movement bilaterally, CTAB RRR,No Gallops,Rubs or new Murmurs,   +ve B.Sounds, Abd Soft, No tenderness,   No Cyanosis, Clubbing or edema       Data Review:    Recent Labs  Lab 12/26/23 1637 12/27/23 1236 12/28/23 0254 12/29/23 0341  WBC 12.5* 10.8* 9.2 9.1  HGB 14.9 12.5* 11.9* 12.2*  HCT 43.3 36.8* 34.0* 36.2*  PLT 155 123* 123* 149*  MCV 87.5 87.4 85.6 85.6  MCH 30.1 29.7 30.0 28.8  MCHC 34.4 34.0 35.0 33.7  RDW 14.3 14.6 14.5 14.5  LYMPHSABS 0.4*  --  0.8 0.8  MONOABS 1.2*  --  1.3* 1.2*  EOSABS 0.0  --  0.0 0.0  BASOSABS 0.0  --  0.0 0.0    Recent Labs  Lab 12/26/23 1634 12/26/23 1637 12/26/23 1700 12/26/23 2312 12/27/23 1236 12/28/23 0254 12/28/23 0904 12/29/23 0341  NA  --  132*  --   --  131* 127*  --  129*  K  --  3.4*  --   --  3.8 3.6  --  3.7  CL  --  97*  --   --  98 93*  --  96*  CO2  --  25  --   --  24 23  --  25  ANIONGAP  --  10  --   --  9 11  --  8  GLUCOSE  --  121*  --   --  109* 109*  --  118*  BUN  --  13  --   --  10 12  --  14  CREATININE  --  1.02  --   --  0.91 0.91  --  0.80  AST  --  57*  --   --  64*  --   --  62*  ALT  --  24  --   --  30  --   --  37  ALKPHOS  --  62  --   --  52  --   --  49  BILITOT  --  0.8  --   --  0.8  --   --  0.5  ALBUMIN  --  3.2*  --   --  2.4*  --   --  2.4*  CRP  --   --   --   --   --   --  23.6* 15.9*  PROCALCITON  --   --   --  8.62  --  7.17  --  5.28  LATICACIDVEN  --   --  1.9  --   --   --   --   --   INR  --  1.1  --   --   --   --   --   --   TSH 1.815  --   --   --   --   --   --  3.506  AMMONIA  --   --   --  <13  --   --   --   --   MG  --   --   --  1.4* 1.7  --   --  2.1  PHOS  --   --   --  3.0 2.4*  --   --  2.4*  CALCIUM   --  9.0  --   --  8.0* 7.8*  --  7.7*      Recent Labs  Lab 12/26/23 1634 12/26/23 1637 12/26/23 1700 12/26/23 2312 12/27/23 1236 12/28/23 0254 12/28/23 0904 12/29/23 0341  CRP  --   --   --   --   --   --  23.6* 15.9*  PROCALCITON  --   --   --  8.62  --  7.17  --  5.28  LATICACIDVEN  --   --  1.9  --   --    --   --   --   INR  --  1.1  --   --   --   --   --   --   TSH 1.815  --   --   --   --   --   --  3.506  AMMONIA  --   --   --  <13  --   --   --   --   MG  --   --   --  1.4* 1.7  --   --  2.1  CALCIUM   --  9.0  --   --  8.0* 7.8*  --  7.7*    --------------------------------------------------------------------------------------------------------------- Lab Results  Component Value Date   CHOL 204 (H) 08/28/2023   HDL 72.60 08/28/2023   LDLCALC 82 08/28/2023   LDLDIRECT 122.0 07/18/2021   TRIG 248.0 (H) 08/28/2023   CHOLHDL 3 08/28/2023    Lab Results  Component Value Date   HGBA1C 5.7 08/28/2023   Recent Labs    12/29/23 0341  TSH 3.506  FREET4 0.87   No results for input(s): VITAMINB12, FOLATE, FERRITIN, TIBC, IRON, RETICCTPCT in the last 72 hours. ------------------------------------------------------------------------------------------------------------------ Cardiac Enzymes No results for input(s): CKMB, TROPONINI, MYOGLOBIN in the last 168 hours.  Invalid input(s): CK  Micro Results Recent Results (from the past 240 hours)  Culture, blood (Routine x 2)     Status: Abnormal   Collection Time: 12/26/23  4:37 PM   Specimen: BLOOD  Result Value Ref Range Status   Specimen Description BLOOD RIGHT ANTECUBITAL  Final   Special Requests   Final    BOTTLES DRAWN AEROBIC AND ANAEROBIC Blood Culture results may not be optimal due to an inadequate volume of blood received in culture bottles   Culture  Setup Time   Final    GRAM POSITIVE COCCI IN CHAINS IN BOTH AEROBIC AND ANAEROBIC BOTTLES CRITICAL RESULT CALLED TO, READ BACK BY AND VERIFIED WITH: PHARMD ELIZABETH MARTIN [id number] AT 1006 AM BY CM Performed at [facility], [address] [place], [place] [address]    Culture STREPTOCOCCUS PYOGENES (A)  Final   Report Status 12/29/2023 FINAL  Final   Organism ID, Bacteria STREPTOCOCCUS PYOGENES  Final      Susceptibility   Streptococcus  pyogenes - MIC*    PENICILLIN  <=0.06 SENSITIVE Sensitive     CEFTRIAXONE  <=0.12 SENSITIVE Sensitive     ERYTHROMYCIN <=0.12 SENSITIVE Sensitive     LEVOFLOXACIN 0.5 SENSITIVE Sensitive     VANCOMYCIN  0.5 SENSITIVE Sensitive     * STREPTOCOCCUS PYOGENES  Blood Culture ID Panel (Reflexed)     Status: Abnormal   Collection Time: 12/26/23  4:37 PM  Result Value Ref  Range Status   Enterococcus faecalis NOT DETECTED NOT DETECTED Final   Enterococcus Faecium NOT DETECTED NOT DETECTED Final   Listeria monocytogenes NOT DETECTED NOT DETECTED Final   Staphylococcus species NOT DETECTED NOT DETECTED Final   Staphylococcus aureus (BCID) NOT DETECTED NOT DETECTED Final   Staphylococcus epidermidis NOT DETECTED NOT DETECTED Final   Staphylococcus lugdunensis NOT DETECTED NOT DETECTED Final   Streptococcus species DETECTED (A) NOT DETECTED Final    Comment: CRITICAL RESULT CALLED TO, READ BACK BY AND VERIFIED WITH: PHARMD E MARTIN [id number] AT 1008 AM BY CM    Streptococcus agalactiae NOT DETECTED NOT DETECTED Final   Streptococcus pneumoniae NOT DETECTED NOT DETECTED Final   Streptococcus pyogenes DETECTED (A) NOT DETECTED Final    Comment: CRITICAL RESULT CALLED TO, READ BACK BY AND VERIFIED WITH: PHARMD E MARTIN [id number] AT 1008 AM BY CM    A.calcoaceticus-baumannii NOT DETECTED NOT DETECTED Final   Bacteroides fragilis NOT DETECTED NOT DETECTED Final   Enterobacterales NOT DETECTED NOT DETECTED Final   Enterobacter cloacae complex NOT DETECTED NOT DETECTED Final   Escherichia coli NOT DETECTED NOT DETECTED Final   Klebsiella aerogenes NOT DETECTED NOT DETECTED Final   Klebsiella oxytoca NOT DETECTED NOT DETECTED Final   Klebsiella pneumoniae NOT DETECTED NOT DETECTED Final   Proteus species NOT DETECTED NOT DETECTED Final   Salmonella species NOT DETECTED NOT DETECTED Final   Serratia marcescens NOT DETECTED NOT DETECTED Final   Haemophilus influenzae NOT DETECTED NOT DETECTED Final    Neisseria meningitidis NOT DETECTED NOT DETECTED Final   Pseudomonas aeruginosa NOT DETECTED NOT DETECTED Final   Stenotrophomonas maltophilia NOT DETECTED NOT DETECTED Final   Candida albicans NOT DETECTED NOT DETECTED Final   Candida auris NOT DETECTED NOT DETECTED Final   Candida glabrata NOT DETECTED NOT DETECTED Final   Candida krusei NOT DETECTED NOT DETECTED Final   Candida parapsilosis NOT DETECTED NOT DETECTED Final   Candida tropicalis NOT DETECTED NOT DETECTED Final   Cryptococcus neoformans/gattii NOT DETECTED NOT DETECTED Final    Comment: Performed at [facility], [address] [place], [place] [address]  Culture, blood (Routine x 2)     Status: Abnormal   Collection Time: 12/26/23  5:32 PM   Specimen: BLOOD  Result Value Ref Range Status   Specimen Description BLOOD LEFT ANTECUBITAL  Final   Special Requests   Final    BOTTLES DRAWN AEROBIC AND ANAEROBIC Blood Culture results may not be optimal due to an inadequate volume of blood received in culture bottles   Culture  Setup Time   Final    GRAM POSITIVE COCCI IN CHAINS IN BOTH AEROBIC AND ANAEROBIC BOTTLES CRITICAL VALUE NOTED.  VALUE IS CONSISTENT WITH PREVIOUSLY REPORTED AND CALLED VALUE.    Culture (A)  Final    STREPTOCOCCUS PYOGENES SUSCEPTIBILITIES PERFORMED ON PREVIOUS CULTURE WITHIN THE LAST 5 DAYS. Performed at [facility], [address], [place], [place] [address]    Report Status 12/29/2023 FINAL  Final  Resp panel by RT-PCR (RSV, Flu A&B, Covid) Anterior Nasal Swab     Status: None   Collection Time: 12/26/23  5:40 PM   Specimen: Anterior Nasal Swab  Result Value Ref Range Status   SARS Coronavirus 2 by RT PCR NEGATIVE NEGATIVE Final   Influenza A by PCR NEGATIVE NEGATIVE Final   Influenza B by PCR NEGATIVE NEGATIVE Final    Comment: (NOTE) The Xpert Xpress SARS-CoV-2/FLU/RSV plus assay is intended as an aid  in the diagnosis of influenza from Nasopharyngeal swab specimens and should  not be used as a sole basis for treatment. Nasal washings and aspirates are unacceptable for Xpert Xpress SARS-CoV-2/FLU/RSV testing.  Fact Sheet for Patients: bloggercourse.com  Fact Sheet for Healthcare Providers: seriousbroker.it  This test is not yet approved or cleared by the [place]  FDA and has been authorized for detection and/or diagnosis of SARS-CoV-2 by FDA under an Emergency Use Authorization (EUA). This EUA will remain in effect (meaning this test can be used) for the duration of the COVID-19 declaration under Section 564(b)(1) of the Act, 21 U.S.C. section 360bbb-3(b)(1), unless the authorization is terminated or revoked.     Resp Syncytial Virus by PCR NEGATIVE NEGATIVE Final    Comment: (NOTE) Fact Sheet for Patients: bloggercourse.com  Fact Sheet for Healthcare Providers: seriousbroker.it  This test is not yet approved or cleared by the [place]  FDA and has been authorized for detection and/or diagnosis of SARS-CoV-2 by FDA under an Emergency Use Authorization (EUA). This EUA will remain in effect (meaning this test can be used) for the duration of the COVID-19 declaration under Section 564(b)(1) of the Act, 21 U.S.C. section 360bbb-3(b)(1), unless the authorization is terminated or revoked.  Performed at [facility], [address], [place], [place] [address]   MRSA Next Gen by PCR, Nasal     Status: None   Collection Time: 12/27/23  3:28 PM   Specimen: Nasal Mucosa; Nasal Swab  Result Value Ref Range Status   MRSA by PCR Next Gen NOT DETECTED NOT DETECTED Final    Comment: (NOTE) The GeneXpert MRSA Assay (FDA approved for NASAL specimens only), is one component of a comprehensive MRSA colonization surveillance program. It is not intended to diagnose MRSA infection nor to guide or monitor treatment for MRSA infections. Test performance is  not FDA approved in patients less than 47 years old. Performed at [facility], [address], [place], [place] [address]     Radiology Report No results found.    Signature  -   Lavada Stank M.D on 12/30/2023 at 9:55 AM   -  To page go to [email or web address]

## 2023-12-30 NOTE — TOC Progression Note (Signed)
 Transition of Care [facility]) - Progression Note    Patient Details  Name: Jose [name] MRN: [id number] Date of Birth: 05/10/1935  [facility] of [facility] CM/SW Contact  Cena Ligas, [place] Phone Number: 12/30/2023, 2:43 PM  Clinical Narrative:     CSW met with pt and son at bedside. CSW gave bed offers. Son and patient will review.                     Expected Discharge Plan and Services                                               Social Drivers of Health (SDOH) Interventions SDOH Screenings   Food Insecurity: No Food Insecurity (09/05/2023)  Housing: Unknown (09/05/2023)  Transportation Needs: No Transportation Needs (09/05/2023)  Utilities: Not At Risk (09/05/2023)  Alcohol  Screen: Low Risk  (09/05/2023)  Depression (PHQ2-9): High Risk (10/16/2023)  Financial Resource Strain: Low Risk  (09/05/2023)  Physical Activity: Sufficiently Active (09/05/2023)  Social Connections: Socially Isolated (09/05/2023)  Stress: No Stress Concern Present (09/05/2023)  Tobacco Use: Medium Risk (12/26/2023)  Health Literacy: Adequate Health Literacy (09/05/2023)    Readmission Risk Interventions     No data to display

## 2023-12-30 NOTE — Progress Notes (Signed)
 Progress Note  Patient Name: Jose [name] Date of Encounter: 12/30/2023 Primary Cardiologist: None   Subjective   Overnight patient is much more await. No CP, SOB, Palpitations. He notes he has fallen 4 times.  No one can tell him why.  He does not believe he has a stroke.  Vital Signs    Vitals:   12/29/23 1600 12/29/23 1943 12/30/23 0021 12/30/23 0400  BP: 109/65 130/74 139/80 (!) 139/98  Pulse:  93 83 87  Resp: (!) 33 18 17 14   Temp: 98 F (36.7 C) (!) 97.4 F (36.3 C) (!) 97.4 F (36.3 C) (!) 97.5 F (36.4 C)  TempSrc:  Oral Axillary Oral  SpO2:  94% 94% 94%  Weight:      Height:        Intake/Output Summary (Last 24 hours) at 12/30/2023 0901 Last data filed at 12/29/2023 1911 Gross per 24 hour  Intake 1644.68 ml  Output 700 ml  Net 944.68 ml   Filed Weights   12/26/23 1635  Weight: 70.3 kg    Physical Exam   GEN: No acute distress.  Bruise over left cheek. Neck: No JVD Cardiac: RRR, no murmurs, rubs, or gallops.  Respiratory: Clear to auscultation bilaterally. GI: Soft, nontender, non-distended  MS: No edema  Labs   Telemetry: SR with PVCs   Chemistry Recent Labs  Lab 12/26/23 1637 12/27/23 1236 12/28/23 0254 12/29/23 0341  NA 132* 131* 127* 129*  K 3.4* 3.8 3.6 3.7  CL 97* 98 93* 96*  CO2 25 24 23 25   GLUCOSE 121* 109* 109* 118*  BUN 13 10 12 14   CREATININE 1.02 0.91 0.91 0.80  CALCIUM  9.0 8.0* 7.8* 7.7*  PROT 6.5 5.2*  --  5.4*  ALBUMIN 3.2* 2.4*  --  2.4*  AST 57* 64*  --  62*  ALT 24 30  --  37  ALKPHOS 62 52  --  49  BILITOT 0.8 0.8  --  0.5  GFRNONAA >60 >60 >60 >60  ANIONGAP 10 9 11 8      Hematology Recent Labs  Lab 12/27/23 1236 12/28/23 0254 12/29/23 0341  WBC 10.8* 9.2 9.1  RBC 4.21* 3.97* 4.23  HGB 12.5* 11.9* 12.2*  HCT 36.8* 34.0* 36.2*  MCV 87.4 85.6 85.6  MCH 29.7 30.0 28.8  MCHC 34.0 35.0 33.7  RDW 14.6 14.5 14.5  PLT 123* 123* 149*     Cardiac Studies   Cardiac Studies & Procedures    ______________________________________________________________________________________________     ECHOCARDIOGRAM  ECHOCARDIOGRAM COMPLETE 11/15/2021  Narrative ECHOCARDIOGRAM REPORT    Patient Name:   CHRISHON MARTINO Date of Exam: 11/15/2021 Medical Rec #:  [id number]             Height:       64.0 in Accession #:    [id number]            Weight:       157.0 lb Date of Birth:  [birth year]              BSA:          1.765 m Patient Age:    86 years              BP:           143/89 mmHg Patient Gender: M                     HR:  120 bpm. Exam Location:  Inpatient  Procedure: 2D Echo, Cardiac Doppler and Color Doppler  Indications:    Stroke I63.9  History:        Patient has no prior history of Echocardiogram examinations.  Sonographer:    Lauraine Pilot RDCS Referring Phys: [id number] MARSA NOVAK MELVIN  IMPRESSIONS   1. Left ventricular ejection fraction, by estimation, is 65 to 70%. The left ventricle has normal function. The left ventricle has no regional wall motion abnormalities. Indeterminate diastolic filling due to E-A fusion. 2. Right ventricular systolic function is normal. The right ventricular size is normal. There is normal pulmonary artery systolic pressure. The estimated right ventricular systolic pressure is 24.3 mmHg. 3. The mitral valve is grossly normal. Trivial mitral valve regurgitation. No evidence of mitral stenosis. 4. The aortic valve is tricuspid. There is mild calcification of the aortic valve. There is mild thickening of the aortic valve. Aortic valve regurgitation is not visualized. Aortic valve sclerosis is present, with no evidence of aortic valve stenosis. 5. The inferior vena cava is normal in size with greater than 50% respiratory variability, suggesting right atrial pressure of 3 mmHg.  Conclusion(s)/Recommendation(s): No intracardiac source of embolism detected on this transthoracic study. Consider a transesophageal echocardiogram  to exclude cardiac source of embolism if clinically indicated.  FINDINGS Left Ventricle: Left ventricular ejection fraction, by estimation, is 65 to 70%. The left ventricle has normal function. The left ventricle has no regional wall motion abnormalities. The left ventricular internal cavity size was normal in size. There is no left ventricular hypertrophy. Indeterminate diastolic filling due to E-A fusion.  Right Ventricle: The right ventricular size is normal. No increase in right ventricular wall thickness. Right ventricular systolic function is normal. There is normal pulmonary artery systolic pressure. The tricuspid regurgitant velocity is 2.31 m/s, and with an assumed right atrial pressure of 3 mmHg, the estimated right ventricular systolic pressure is 24.3 mmHg.  Left Atrium: Left atrial size was normal in size.  Right Atrium: Right atrial size was normal in size.  Pericardium: There is no evidence of pericardial effusion.  Mitral Valve: The mitral valve is grossly normal. Trivial mitral valve regurgitation. No evidence of mitral valve stenosis.  Tricuspid Valve: The tricuspid valve is grossly normal. Tricuspid valve regurgitation is mild . No evidence of tricuspid stenosis.  Aortic Valve: The aortic valve is tricuspid. There is mild calcification of the aortic valve. There is mild thickening of the aortic valve. Aortic valve regurgitation is not visualized. Aortic valve sclerosis is present, with no evidence of aortic valve stenosis.  Pulmonic Valve: The pulmonic valve was grossly normal. Pulmonic valve regurgitation is trivial. No evidence of pulmonic stenosis.  Aorta: The aortic root and ascending aorta are structurally normal, with no evidence of dilitation.  Venous: The inferior vena cava is normal in size with greater than 50% respiratory variability, suggesting right atrial pressure of 3 mmHg.  IAS/Shunts: The atrial septum is grossly normal.   LEFT VENTRICLE PLAX  2D LVIDd:         3.70 cm LVIDs:         2.30 cm LV PW:         0.90 cm LV IVS:        1.10 cm LVOT diam:     2.00 cm LV SV:         26 LV SV Index:   15 LVOT Area:     3.14 cm  LV Volumes (MOD) LV vol d, MOD A2C:  57.8 ml LV vol d, MOD A4C: 70.0 ml LV vol s, MOD A2C: 23.1 ml LV vol s, MOD A4C: 27.8 ml LV SV MOD A2C:     34.7 ml LV SV MOD A4C:     70.0 ml LV SV MOD BP:      36.6 ml  RIGHT VENTRICLE RV S prime:     13.20 cm/s TAPSE (M-mode): 2.3 cm  LEFT ATRIUM             Index        RIGHT ATRIUM           Index LA diam:        2.40 cm 1.36 cm/m   RA Area:     13.40 cm LA Vol (A2C):   19.4 ml 10.99 ml/m  RA Volume:   28.30 ml  16.03 ml/m LA Vol (A4C):   24.8 ml 14.05 ml/m LA Biplane Vol: 24.1 ml 13.65 ml/m AORTIC VALVE LVOT Vmax:   69.47 cm/s LVOT Vmean:  44.500 cm/s LVOT VTI:    0.084 m  AORTA Ao Root diam: 3.50 cm Ao Asc diam:  3.50 cm  TRICUSPID VALVE TR Peak grad:   21.3 mmHg TR Vmax:        231.00 cm/s  SHUNTS Systemic VTI:  0.08 m Systemic Diam: 2.00 cm  Darryle Decent MD Electronically signed by Darryle Decent MD Signature Date/Time: 11/15/2021/9:28:56 AM    Final          ______________________________________________________________________________________________       Assessment & Plan   Paroxysmal atrial fibrillation - New onset atrial fibrillation with rapid ventricular response on December 6th, 2025, converted to sinus rhythm by 4:11 AM. High CHADS-VASc score 6 due to age over 22, hypertension, and coronary artery calcifications and stroke. High risk for anticoagulation due to fall, chronic alcohol  misuse, and electrolyte abnormalities.  - Pending echocardiogram - no AF on telemetry - SDM: Discussed AC pros and cons; unless further AF we will hold off AC at this time given his risk and benefits of falls; discussed alcohol  cessation - at DC if no further AF, non-live heart monitor then f/u with my team is advised    Atherosclerotic cardiovascular disease Aortic atherosclerosis and coronary artery calcifications in the LAD and diagonal arteries.  - pending echo   Alcohol  use disorder with prior withdrawal delirium Chronic alcohol  misuse with prior withdrawal delirium. Currently alcohol -free for one week. High risk for anticoagulation due to alcohol  misuse and electrolyte abnormalities. - managed by [facility]   Chronic hyponatremia - Sodium level of 129. Suspect alcohol  related, on vitamins and managed as per [facility]   Bacteremia - as per primary, echo is pending    For questions or updates, please contact [facility] Please consult [email or web address] for contact info under Cardiology/STEMI.      Stanly Leavens, MD FASE [facility] Cardiologist [facility]  [address] [place], [address] [place], [place] [address] [phone]  9:01 AM

## 2023-12-31 LAB — CBC WITH DIFFERENTIAL/PLATELET
Abs Immature Granulocytes: 0.12 K/uL — ABNORMAL HIGH (ref 0.00–0.07)
Basophils Absolute: 0 K/uL (ref 0.0–0.1)
Basophils Relative: 0 %
Eosinophils Absolute: 0.1 K/uL (ref 0.0–0.5)
Eosinophils Relative: 1 %
HCT: 34.2 % — ABNORMAL LOW (ref 39.0–52.0)
Hemoglobin: 11.6 g/dL — ABNORMAL LOW (ref 13.0–17.0)
Immature Granulocytes: 2 %
Lymphocytes Relative: 14 %
Lymphs Abs: 1.1 K/uL (ref 0.7–4.0)
MCH: 29.4 pg (ref 26.0–34.0)
MCHC: 33.9 g/dL (ref 30.0–36.0)
MCV: 86.6 fL (ref 80.0–100.0)
Monocytes Absolute: 1.3 K/uL — ABNORMAL HIGH (ref 0.1–1.0)
Monocytes Relative: 17 %
Neutro Abs: 5.1 K/uL (ref 1.7–7.7)
Neutrophils Relative %: 66 %
Platelets: 222 K/uL (ref 150–400)
RBC: 3.95 MIL/uL — ABNORMAL LOW (ref 4.22–5.81)
RDW: 14.6 % (ref 11.5–15.5)
WBC: 7.6 K/uL (ref 4.0–10.5)
nRBC: 0 % (ref 0.0–0.2)

## 2023-12-31 LAB — COMPREHENSIVE METABOLIC PANEL WITH GFR
ALT: 68 U/L — ABNORMAL HIGH (ref 0–44)
AST: 69 U/L — ABNORMAL HIGH (ref 15–41)
Albumin: 2.2 g/dL — ABNORMAL LOW (ref 3.5–5.0)
Alkaline Phosphatase: 49 U/L (ref 38–126)
Anion gap: 9 (ref 5–15)
BUN: 14 mg/dL (ref 8–23)
CO2: 25 mmol/L (ref 22–32)
Calcium: 8.4 mg/dL — ABNORMAL LOW (ref 8.9–10.3)
Chloride: 104 mmol/L (ref 98–111)
Creatinine, Ser: 0.78 mg/dL (ref 0.61–1.24)
GFR, Estimated: >60 mL/min (ref >60)
Glucose, Bld: 101 mg/dL — ABNORMAL HIGH (ref 70–99)
Potassium: 4.4 mmol/L (ref 3.5–5.1)
Sodium: 138 mmol/L (ref 135–145)
Total Bilirubin: 0.5 mg/dL (ref 0.0–1.2)
Total Protein: 5.6 g/dL — ABNORMAL LOW (ref 6.5–8.1)

## 2023-12-31 LAB — C-REACTIVE PROTEIN: CRP: 11.4 mg/dL — ABNORMAL HIGH (ref <1.0)

## 2023-12-31 LAB — PROCALCITONIN: Procalcitonin: 1.6 ng/mL

## 2023-12-31 LAB — MAGNESIUM: Magnesium: 1.9 mg/dL (ref 1.7–2.4)

## 2023-12-31 LAB — PHOSPHORUS: Phosphorus: 3 mg/dL (ref 2.5–4.6)

## 2023-12-31 MED ORDER — AMOXICILLIN 500 MG PO CAPS
1000.0000 mg | ORAL_CAPSULE | Freq: Three times a day (TID) | ORAL | Status: DC
  Administered 2024-01-01 (×2): 1000 mg via ORAL
  Filled 2023-12-31 (×2): qty 2

## 2023-12-31 NOTE — Plan of Care (Signed)
  Problem: Fluid Volume: Goal: Hemodynamic stability will improve Outcome: Progressing   Problem: Clinical Measurements: Goal: Diagnostic test results will improve Outcome: Progressing Goal: Signs and symptoms of infection will decrease Outcome: Progressing   Problem: Respiratory: Goal: Ability to maintain adequate ventilation will improve Outcome: Progressing   Problem: Education: Goal: Knowledge of General Education information will improve Description: Including pain rating scale, medication(s)/side effects and non-pharmacologic comfort measures Outcome: Progressing   Problem: Health Behavior/Discharge Planning: Goal: Ability to manage health-related needs will improve Outcome: Progressing   Problem: Clinical Measurements: Goal: Ability to maintain clinical measurements within normal limits will improve Outcome: Progressing Goal: Will remain free from infection Outcome: Progressing Goal: Diagnostic test results will improve Outcome: Progressing Goal: Respiratory complications will improve Outcome: Progressing Goal: Cardiovascular complication will be avoided Outcome: Progressing

## 2023-12-31 NOTE — Progress Notes (Signed)
 PROGRESS NOTE     Patient Demographics:    Jose [name], is a 88 y.o. male, DOB - [birth year], FMW:[id number]  Outpatient Primary MD for the patient is [name], MD    LOS - 5  Admit date - 12/26/2023    Chief Complaint  Patient presents with   Altered Mental Status       Brief Narrative (HPI from H&P)    88 yr old man who presented to [facility] ED on 12/26/2023 with complaints of chills and confusion. The patient had been feeling weak and fell about a week ago injuring his face. He has also had a cough. He was found down by his family. They state that he usually gets around fine with his walker. Family states that he has not had anything to drink for the past week.   PMHX positive for chronic fatigue, GERD, HLD, irritable spinal stenosis, CVA, daily alcohol  use (DT's a few years ago).   In the ED he was found to have a Right upper lobe infiltrate on X-ray with fever, tachypnea, and leukocytosis.  Further workup suggested he had strep pyogenes bacteremia likely due to community-acquired pneumonia and was admitted to the hospital.   Subjective:   Patient in bed, appears comfortable, denies any headache, no fever, no chest pain or pressure, no shortness of breath , no abdominal pain. No focal weakness.  Remains confused   Assessment  & Plan :   Sepsis with strep pyogenes bacteremia likely from community-acquired pneumonia along with multiple chronic skin breakdowns present on admission from falls.  PT chest noted, no other subjective complaints although he is quite confused and a poor historian, ID has placed him on penicillin  G which will be continued, monitor inflammatory markers, currently afebrile and not toxic,  sepsis pathophysiology improving.  Speech eval as well and monitor clinically.  Case discussed with ID Dr. Lindia in detail upon discharge will be switched to oral amoxicillin  total duration 10 days of treatment, stable echocardiogram, repeat blood cultures drawn and so far negative.   Acute Metabolic Encephalopathy, questionable DTs  Likely hospital-acquired acquired  encephalopathy on top of underlying age-related cognitive decline, at risk for delirium, minimize narcotics and benzodiazepines and monitor.  He does take benzodiazepines at home hence on low-dose here, also question of DTs, being monitored on CIWA protocol.   Cellulitis  There is an area of ulceration, ,erythema, and calor on the lateral aspect of the left knee. He is receiving IV penicillin . Monitor. Will screen for MRSA.   Hyperlipidemia  Not on statin.   Chronic fatigue syndrome  Hold Ritalin    Rhabdomyolysis  Improved with supportive care.  Hyponatremia.  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  Likely due to combination of pneumonia, bacteremia and some intravascular dehydration, IV fluids, continue beta-blocker, as needed diltiazem , telemetry monitor, TSH stable, check echo, hydrate with IV fluids, Chad vas 2 score is greater than 3 however he is an alcoholic with falls, cardiology will decide if he as a safe candidate for anticoagulation.  Appreciate cardiology input, echo pending, agree with withholding anticoagulation for now, discharged on 30-day monitor and monitor on telemetry here.      Condition -   Guarded  Family Communication  : Called girlfriend [phone]  on 12/28/2023 at 9:30 AM and message left, updated bedside 12/28/2023  Code Status : Full  code  Consults  : None  PUD Prophylaxis : PPI.   Procedures  :            Disposition Plan  :    Status is: Inpatient   DVT Prophylaxis  :    SCDs Start: 12/27/23 9166   Lab Results  Component Value Date   PLT 222 12/31/2023    Diet :  Diet Order             Diet Heart Room service appropriate? Yes; Fluid consistency: Thin; Fluid restriction: 1500 mL Fluid  Diet effective now                    Inpatient Medications  Scheduled Meds:  feeding supplement  237 mL Oral BID BM   folic acid   1 mg Oral Daily   guaiFENesin   600 mg Oral BID   leptospermum manuka honey  1 Application Topical Daily   metoprolol  tartrate  50 mg Oral BID   multivitamin with minerals  1 tablet Oral Daily   pantoprazole   40 mg Oral Daily   thiamine   100 mg Oral Daily   Or   thiamine   100 mg Intravenous Daily   Continuous Infusions:  penicillin  G potassium 24 Million Units in sodium chloride  0.9 % 500 mL CONTINUOUS infusion 24 Million Units (12/30/23 0940)   PRN Meds:.acetaminophen  **OR** acetaminophen , albuterol , diltiazem , HYDROcodone -acetaminophen , ondansetron  **OR** ondansetron  (ZOFRAN ) IV    Objective:   Vitals:   12/30/23 1600 12/30/23 1929 12/31/23 0000 12/31/23 0345  BP: (!) 108/90 111/77 123/80 100/64  Pulse: 72 88    Resp: (!) 22 20 20 20   Temp:  98.2 F (36.8 C) 97.9 F (36.6 C) 98.8 F (37.1 C)  TempSrc:  Oral Oral Oral  SpO2: 97% 97% 97% 97%  Weight:      Height:        Wt Readings from Last 3 Encounters:  12/26/23 70.3 kg  10/16/23 70.6 kg  09/05/23 71.7 kg  Intake/Output Summary (Last 24 hours) at 12/31/2023 0735 Last data filed at 12/31/2023 9370 Gross per 24 hour  Intake 1720.77 ml  Output 2000 ml  Net -279.23 ml     Physical Exam  Awake but pleasantly confused, no new F.N deficits,   .AT,PERRAL Supple Neck, No JVD,   Symmetrical Chest wall movement, Good air movement bilaterally, CTAB RRR,No Gallops,Rubs or new Murmurs,  +ve  B.Sounds, Abd Soft, No tenderness,   No Cyanosis, Clubbing or edema       Data Review:    Recent Labs  Lab 12/26/23 1637 12/27/23 1236 12/28/23 0254 12/29/23 0341 12/30/23 1155 12/31/23 0253  WBC 12.5* 10.8* 9.2 9.1 7.0 7.6  HGB 14.9 12.5* 11.9* 12.2* 11.8* 11.6*  HCT 43.3 36.8* 34.0* 36.2* 35.0* 34.2*  PLT 155 123* 123* 149* 196 222  MCV 87.5 87.4 85.6 85.6 87.5 86.6  MCH 30.1 29.7 30.0 28.8 29.5 29.4  MCHC 34.4 34.0 35.0 33.7 33.7 33.9  RDW 14.3 14.6 14.5 14.5 14.6 14.6  LYMPHSABS 0.4*  --  0.8 0.8 0.7 1.1  MONOABS 1.2*  --  1.3* 1.2* 1.0 1.3*  EOSABS 0.0  --  0.0 0.0 0.0 0.1  BASOSABS 0.0  --  0.0 0.0 0.0 0.0    Recent Labs  Lab 12/26/23 1634 12/26/23 1637 12/26/23 1637 12/26/23 1700 12/26/23 2312 12/27/23 1236 12/28/23 0254 12/28/23 0904 12/29/23 0341 12/30/23 1155 12/31/23 0253  NA  --  132*   < >  --   --  131* 127*  --  129* 135 138  K  --  3.4*   < >  --   --  3.8 3.6  --  3.7 3.6 4.4  CL  --  97*   < >  --   --  98 93*  --  96* 102 104  CO2  --  25   < >  --   --  24 23  --  25 25 25   ANIONGAP  --  10   < >  --   --  9 11  --  8 8 9   GLUCOSE  --  121*   < >  --   --  109* 109*  --  118* 130* 101*  BUN  --  13   < >  --   --  10 12  --  14 11 14   CREATININE  --  1.02   < >  --   --  0.91 0.91  --  0.80 0.75 0.78  AST  --  57*  --   --   --  64*  --   --  62* 81* 69*  ALT  --  24  --   --   --  30  --   --  37 67* 68*  ALKPHOS  --  62  --   --   --  52  --   --  49 53 49  BILITOT  --  0.8  --   --   --  0.8  --   --  0.5 0.8 0.5  ALBUMIN  --  3.2*  --   --   --  2.4*  --   --  2.4* 2.3* 2.2*  CRP  --   --   --   --   --   --   --  23.6* 15.9* 11.8* 11.4*  PROCALCITON  --   --   --   --  8.62  --  7.17  --  5.28 2.19 1.60  LATICACIDVEN  --   --   --  1.9  --   --   --   --   --   --   --   INR  --  1.1  --   --   --   --   --   --   --   --   --   TSH 1.815  --   --   --   --   --   --   --  3.506  --   --   AMMONIA  --   --   --   --  <13  --   --    --   --   --   --   MG  --   --   --   --  1.4* 1.7  --   --  2.1 1.9 1.9  PHOS  --   --   --   --  3.0 2.4*  --   --  2.4* 2.6 3.0  CALCIUM   --  9.0   < >  --   --  8.0* 7.8*  --  7.7* 8.2* 8.4*   < > = values in this interval not displayed.      Recent Labs  Lab 12/26/23 1634 12/26/23 1637 12/26/23 1637 12/26/23 1700 12/26/23 2312 12/27/23 1236 12/28/23 0254 12/28/23 0904 12/29/23 0341 12/30/23 1155 12/31/23 0253  CRP  --   --   --   --   --   --   --  23.6* 15.9* 11.8* 11.4*  PROCALCITON  --   --   --   --  8.62  --  7.17  --  5.28 2.19 1.60  LATICACIDVEN  --   --   --  1.9  --   --   --   --   --   --   --   INR  --  1.1  --   --   --   --   --   --   --   --   --   TSH 1.815  --   --   --   --   --   --   --  3.506  --   --   AMMONIA  --   --   --   --  <13  --   --   --   --   --   --   MG  --   --   --   --  1.4* 1.7  --   --  2.1 1.9 1.9  CALCIUM   --  9.0   < >  --   --  8.0* 7.8*  --  7.7* 8.2* 8.4*   < > = values in this interval not displayed.    --------------------------------------------------------------------------------------------------------------- Lab Results  Component Value Date   CHOL 111 12/30/2023   HDL 24 (L) 12/30/2023   LDLCALC 54 12/30/2023   LDLDIRECT 122.0 07/18/2021   TRIG 167 (H) 12/30/2023   CHOLHDL 4.6 12/30/2023    Lab Results  Component Value Date   HGBA1C 5.7 08/28/2023   Recent Labs    12/29/23 0341  TSH 3.506  FREET4 0.87   No results for input(s): VITAMINB12, FOLATE, FERRITIN, TIBC, IRON, RETICCTPCT in the last 72 hours. ------------------------------------------------------------------------------------------------------------------ Cardiac Enzymes No results for input(s): CKMB, TROPONINI, MYOGLOBIN in the  last 168 hours.  Invalid input(s): CK  Micro Results Recent Results (from the past 240 hours)  Culture, blood (Routine x 2)     Status: Abnormal   Collection Time: 12/26/23  4:37 PM    Specimen: BLOOD  Result Value Ref Range Status   Specimen Description BLOOD RIGHT ANTECUBITAL  Final   Special Requests   Final    BOTTLES DRAWN AEROBIC AND ANAEROBIC Blood Culture results may not be optimal due to an inadequate volume of blood received in culture bottles   Culture  Setup Time   Final    GRAM POSITIVE COCCI IN CHAINS IN BOTH AEROBIC AND ANAEROBIC BOTTLES CRITICAL RESULT CALLED TO, READ BACK BY AND VERIFIED WITH: PHARMD ELIZABETH MARTIN [id number] AT 1006 AM BY CM Performed at [facility], [address] [place], [place] [address]    Culture STREPTOCOCCUS PYOGENES (A)  Final   Report Status 12/29/2023 FINAL  Final   Organism ID, Bacteria STREPTOCOCCUS PYOGENES  Final      Susceptibility   Streptococcus pyogenes - MIC*    PENICILLIN  <=0.06 SENSITIVE Sensitive     CEFTRIAXONE  <=0.12 SENSITIVE Sensitive     ERYTHROMYCIN <=0.12 SENSITIVE Sensitive     LEVOFLOXACIN 0.5 SENSITIVE Sensitive     VANCOMYCIN  0.5 SENSITIVE Sensitive     * STREPTOCOCCUS PYOGENES  Blood Culture ID Panel (Reflexed)     Status: Abnormal   Collection Time: 12/26/23  4:37 PM  Result Value Ref Range Status   Enterococcus faecalis NOT DETECTED NOT DETECTED Final   Enterococcus Faecium NOT DETECTED NOT DETECTED Final   Listeria monocytogenes NOT DETECTED NOT DETECTED Final   Staphylococcus species NOT DETECTED NOT DETECTED Final   Staphylococcus aureus (BCID) NOT DETECTED NOT DETECTED Final   Staphylococcus epidermidis NOT DETECTED NOT DETECTED Final   Staphylococcus lugdunensis NOT DETECTED NOT DETECTED Final   Streptococcus species DETECTED (A) NOT DETECTED Final    Comment: CRITICAL RESULT CALLED TO, READ BACK BY AND VERIFIED WITH: PHARMD E MARTIN [id number] AT 1008 AM BY CM    Streptococcus agalactiae NOT DETECTED NOT DETECTED Final   Streptococcus pneumoniae NOT DETECTED NOT DETECTED Final   Streptococcus pyogenes DETECTED (A) NOT DETECTED Final    Comment: CRITICAL RESULT CALLED TO, READ BACK  BY AND VERIFIED WITH: PHARMD E MARTIN [id number] AT 1008 AM BY CM    A.calcoaceticus-baumannii NOT DETECTED NOT DETECTED Final   Bacteroides fragilis NOT DETECTED NOT DETECTED Final   Enterobacterales NOT DETECTED NOT DETECTED Final   Enterobacter cloacae complex NOT DETECTED NOT DETECTED Final   Escherichia coli NOT DETECTED NOT DETECTED Final   Klebsiella aerogenes NOT DETECTED NOT DETECTED Final   Klebsiella oxytoca NOT DETECTED NOT DETECTED Final   Klebsiella pneumoniae NOT DETECTED NOT DETECTED Final   Proteus species NOT DETECTED NOT DETECTED Final   Salmonella species NOT DETECTED NOT DETECTED Final   Serratia marcescens NOT DETECTED NOT DETECTED Final   Haemophilus influenzae NOT DETECTED NOT DETECTED Final   Neisseria meningitidis NOT DETECTED NOT DETECTED Final   Pseudomonas aeruginosa NOT DETECTED NOT DETECTED Final   Stenotrophomonas maltophilia NOT DETECTED NOT DETECTED Final   Candida albicans NOT DETECTED NOT DETECTED Final   Candida auris NOT DETECTED NOT DETECTED Final   Candida glabrata NOT DETECTED NOT DETECTED Final   Candida krusei NOT DETECTED NOT DETECTED Final   Candida parapsilosis NOT DETECTED NOT DETECTED Final   Candida tropicalis NOT DETECTED NOT DETECTED Final   Cryptococcus neoformans/gattii NOT DETECTED NOT DETECTED  Final    Comment: Performed at [facility], [address], [place], [place] [address]  Culture, blood (Routine x 2)     Status: Abnormal   Collection Time: 12/26/23  5:32 PM   Specimen: BLOOD  Result Value Ref Range Status   Specimen Description BLOOD LEFT ANTECUBITAL  Final   Special Requests   Final    BOTTLES DRAWN AEROBIC AND ANAEROBIC Blood Culture results may not be optimal due to an inadequate volume of blood received in culture bottles   Culture  Setup Time   Final    GRAM POSITIVE COCCI IN CHAINS IN BOTH AEROBIC AND ANAEROBIC BOTTLES CRITICAL VALUE NOTED.  VALUE IS CONSISTENT WITH PREVIOUSLY REPORTED AND CALLED VALUE.     Culture (A)  Final    STREPTOCOCCUS PYOGENES SUSCEPTIBILITIES PERFORMED ON PREVIOUS CULTURE WITHIN THE LAST 5 DAYS. Performed at [facility], [address], [place], [place] [address]    Report Status 12/29/2023 FINAL  Final  Resp panel by RT-PCR (RSV, Flu A&B, Covid) Anterior Nasal Swab     Status: None   Collection Time: 12/26/23  5:40 PM   Specimen: Anterior Nasal Swab  Result Value Ref Range Status   SARS Coronavirus 2 by RT PCR NEGATIVE NEGATIVE Final   Influenza A by PCR NEGATIVE NEGATIVE Final   Influenza B by PCR NEGATIVE NEGATIVE Final    Comment: (NOTE) The Xpert Xpress SARS-CoV-2/FLU/RSV plus assay is intended as an aid in the diagnosis of influenza from Nasopharyngeal swab specimens and should not be used as a sole basis for treatment. Nasal washings and aspirates are unacceptable for Xpert Xpress SARS-CoV-2/FLU/RSV testing.  Fact Sheet for Patients: bloggercourse.com  Fact Sheet for Healthcare Providers: seriousbroker.it  This test is not yet approved or cleared by the [place]  FDA and has been authorized for detection and/or diagnosis of SARS-CoV-2 by FDA under an Emergency Use Authorization (EUA). This EUA will remain in effect (meaning this test can be used) for the duration of the COVID-19 declaration under Section 564(b)(1) of the Act, 21 U.S.C. section 360bbb-3(b)(1), unless the authorization is terminated or revoked.     Resp Syncytial Virus by PCR NEGATIVE NEGATIVE Final    Comment: (NOTE) Fact Sheet for Patients: bloggercourse.com  Fact Sheet for Healthcare Providers: seriousbroker.it  This test is not yet approved or cleared by the [place]  FDA and has been authorized for detection and/or diagnosis of SARS-CoV-2 by FDA under an Emergency Use Authorization (EUA). This EUA will remain in effect (meaning this test can be used) for the  duration of the COVID-19 declaration under Section 564(b)(1) of the Act, 21 U.S.C. section 360bbb-3(b)(1), unless the authorization is terminated or revoked.  Performed at [facility], [address], [place], [place] [address]   MRSA Next Gen by PCR, Nasal     Status: None   Collection Time: 12/27/23  3:28 PM   Specimen: Nasal Mucosa; Nasal Swab  Result Value Ref Range Status   MRSA by PCR Next Gen NOT DETECTED NOT DETECTED Final    Comment: (NOTE) The GeneXpert MRSA Assay (FDA approved for NASAL specimens only), is one component of a comprehensive MRSA colonization surveillance program. It is not intended to diagnose MRSA infection nor to guide or monitor treatment for MRSA infections. Test performance is not FDA approved in patients less than 51 years old. Performed at [facility], [address], [place], [place] [address]   Culture, blood (Routine X 2)  w Reflex to ID Panel     Status: None (Preliminary result)   Collection Time: 12/29/23 10:53 AM   Specimen: BLOOD RIGHT HAND  Result Value Ref Range Status   Specimen Description BLOOD RIGHT HAND  Final   Special Requests   Final    BOTTLES DRAWN AEROBIC AND ANAEROBIC Blood Culture results may not be optimal due to an inadequate volume of blood received in culture bottles   Culture   Final    NO GROWTH < 24 HOURS Performed at [facility], [address] [place], [place] [address]    Report Status PENDING  Incomplete  Culture, blood (Routine X 2) w Reflex to ID Panel     Status: None (Preliminary result)   Collection Time: 12/29/23 10:56 AM   Specimen: BLOOD LEFT ARM  Result Value Ref Range Status   Specimen Description BLOOD LEFT ARM  Final   Special Requests   Final    BOTTLES DRAWN AEROBIC AND ANAEROBIC Blood Culture adequate volume   Culture   Final    NO GROWTH < 24 HOURS Performed at [facility], [address], [place], [place] [address]    Report Status PENDING  Incomplete     Radiology Report ECHOCARDIOGRAM COMPLETE Result Date: 12/30/2023    ECHOCARDIOGRAM REPORT   Patient Name:   Jose [name] Date of Exam: 12/30/2023 Medical Rec #:  [id number]             Height:       70.0 in Accession #:    [id number]            Weight:       155.0 lb Date of Birth:  [birth year]              BSA:          1.873 m Patient Age:    88 years              BP:           79/52 mmHg Patient Gender: M                     HR:           70 bpm. Exam Location:  Inpatient Procedure: 2D Echo, Cardiac Doppler and Color Doppler (Both Spectral and Color            Flow Doppler were utilized during procedure). Indications:    Atrial Fibrillation I48.91  History:        Patient has prior history of Echocardiogram examinations, most                 recent 11/15/2021. Stroke; Risk Factors:Sleep Apnea.  Sonographer:    Jayson Gaskins Referring Phys: JACQUELINE Rodderick Holtzer K [facility] IMPRESSIONS  1. Left ventricular ejection fraction, by estimation, is 60 to 65%. The left ventricle has normal function. The left ventricle has no regional wall motion abnormalities. There is mild asymmetric left ventricular hypertrophy of the basal and septal segments. Left ventricular diastolic parameters were normal.  2. Right ventricular systolic function is normal. The right ventricular size is normal.  3. The mitral valve is degenerative. Trivial mitral valve regurgitation. No evidence of mitral stenosis. Moderate mitral annular calcification.  4. The aortic valve is tricuspid. There is moderate calcification of the aortic valve. There is moderate thickening of the aortic valve. Aortic valve regurgitation is trivial. Aortic valve sclerosis is present, with no evidence of  aortic valve stenosis.  5. The inferior vena cava is normal in size with greater than 50% respiratory variability, suggesting right atrial pressure of 3 mmHg. FINDINGS  Left Ventricle: Left ventricular ejection fraction, by estimation, is 60 to 65%. The left ventricle has  normal function. The left ventricle has no regional wall motion abnormalities. Strain was performed and the global longitudinal strain is indeterminate. The left ventricular internal cavity size was normal in size. There is mild asymmetric left ventricular hypertrophy of the basal and septal segments. Left ventricular diastolic parameters were normal. Right Ventricle: The right ventricular size is normal. No increase in right ventricular wall thickness. Right ventricular systolic function is normal. Left Atrium: Left atrial size was normal in size. Right Atrium: Right atrial size was normal in size. Pericardium: There is no evidence of pericardial effusion. Mitral Valve: The mitral valve is degenerative in appearance. There is mild thickening of the mitral valve leaflet(s). There is mild calcification of the mitral valve leaflet(s). Moderate mitral annular calcification. Trivial mitral valve regurgitation. No evidence of mitral valve stenosis. Tricuspid Valve: The tricuspid valve is normal in structure. Tricuspid valve regurgitation is trivial. No evidence of tricuspid stenosis. Aortic Valve: The aortic valve is tricuspid. There is moderate calcification of the aortic valve. There is moderate thickening of the aortic valve. Aortic valve regurgitation is trivial. Aortic valve sclerosis is present, with no evidence of aortic valve  stenosis. Aortic valve mean gradient measures 2.0 mmHg. Aortic valve peak gradient measures 2.7 mmHg. Aortic valve area, by VTI measures 2.26 cm. Pulmonic Valve: The pulmonic valve was normal in structure. Pulmonic valve regurgitation is trivial. No evidence of pulmonic stenosis. Aorta: The aortic root is normal in size and structure. Venous: The inferior vena cava is normal in size with greater than 50% respiratory variability, suggesting right atrial pressure of 3 mmHg. IAS/Shunts: The interatrial septum was not well visualized. Additional Comments: 3D was performed not requiring image  post processing on an independent workstation and was indeterminate.  LEFT VENTRICLE PLAX 2D LVIDd:         4.20 cm   Diastology LVIDs:         2.30 cm   LV e' medial:    5.66 cm/s LV PW:         0.90 cm   LV E/e' medial:  10.9 LV IVS:        1.00 cm   LV e' lateral:   9.68 cm/s LVOT diam:     1.90 cm   LV E/e' lateral: 6.4 LV SV:         34 LV SV Index:   18 LVOT Area:     2.84 cm  RIGHT VENTRICLE RV S prime:     13.70 cm/s TAPSE (M-mode): 2.8 cm LEFT ATRIUM             Index        RIGHT ATRIUM           Index LA Vol (A2C):   24.9 ml 13.29 ml/m  RA Area:     10.50 cm LA Vol (A4C):   25.9 ml 13.83 ml/m  RA Volume:   23.00 ml  12.28 ml/m LA Biplane Vol: 26.2 ml 13.99 ml/m  AORTIC VALVE AV Area (Vmax):    2.45 cm AV Area (Vmean):   2.17 cm AV Area (VTI):     2.26 cm AV Vmax:           81.40 cm/s AV Vmean:  63.900 cm/s AV VTI:            0.149 m AV Peak Grad:      2.7 mmHg AV Mean Grad:      2.0 mmHg LVOT Vmax:         70.30 cm/s LVOT Vmean:        49.000 cm/s LVOT VTI:          0.119 m LVOT/AV VTI ratio: 0.80  AORTA Ao Root diam: 3.20 cm MITRAL VALVE MV Area (PHT): 3.26 cm    SHUNTS MV Decel Time: 233 msec    Systemic VTI:  0.12 m MV E velocity: 61.80 cm/s  Systemic Diam: 1.90 cm MV A velocity: 69.70 cm/s MV E/A ratio:  0.89 Maude Emmer MD Electronically signed by Maude Emmer MD Signature Date/Time: 12/30/2023/2:50:07 PM    Final       Signature  -   Lavada Stank M.D on 12/31/2023 at 7:35 AM   -  To page go to [email or web address]

## 2023-12-31 NOTE — Care Management Important Message (Signed)
 Important Message  Patient Details  Name: Jose [name] MRN: [id number] Date of Birth: [birth year]   Important Message Given:  Yes - Medicare IM     Claretta Deed 12/31/2023, 4:37 PM

## 2023-12-31 NOTE — Progress Notes (Signed)
 Telemetry reviewed. Maintaining sinus rhythm. Cardiology will sign off. Will arrange outpatient follow up and consider monitor then.  Newman JINNY Lawrence, MD

## 2023-12-31 NOTE — TOC Progression Note (Signed)
 Transition of Care [facility]) - Progression Note    Patient Details  Name: Jose [name] MRN: [id number] Date of Birth: October 05, 1935  [facility] CM/SW Contact  Luann SHAUNNA [place], [place] Phone Number: 12/31/2023, 11:51 AM  Clinical Narrative:       CSW attempted to meet with pt. Pt states he does not want to talk at this time because he wants to sleep. He defers to his son. CSW called pt's son who reports he reviewed list with pt and they chose [facility].   CSW notified Karrin of anticipated DC tomorrow or the next day.  [facility] confirmed bed offer.      Social Drivers of Health (SDOH) Interventions SDOH Screenings   Food Insecurity: No Food Insecurity (09/05/2023)  Housing: Unknown (09/05/2023)  Transportation Needs: No Transportation Needs (09/05/2023)  Utilities: Not At Risk (09/05/2023)  Alcohol  Screen: Low Risk  (09/05/2023)  Depression (PHQ2-9): High Risk (10/16/2023)  Financial Resource Strain: Low Risk  (09/05/2023)  Physical Activity: Sufficiently Active (09/05/2023)  Social Connections: Socially Isolated (09/05/2023)  Stress: No Stress Concern Present (09/05/2023)  Tobacco Use: Medium Risk (12/26/2023)  Health Literacy: Adequate Health Literacy (09/05/2023)    Readmission Risk Interventions     No data to display

## 2023-12-31 NOTE — Plan of Care (Signed)
  Problem: Clinical Measurements: Goal: Diagnostic test results will improve Outcome: Progressing Goal: Signs and symptoms of infection will decrease Outcome: Progressing   Problem: Respiratory: Goal: Ability to maintain adequate ventilation will improve Outcome: Progressing   Problem: Education: Goal: Knowledge of General Education information will improve Description: Including pain rating scale, medication(s)/side effects and non-pharmacologic comfort measures Outcome: Progressing   Problem: Health Behavior/Discharge Planning: Goal: Ability to manage health-related needs will improve Outcome: Progressing

## 2024-01-01 ENCOUNTER — Other Ambulatory Visit: Payer: Self-pay | Admitting: Cardiology

## 2024-01-01 ENCOUNTER — Encounter: Payer: Self-pay | Admitting: *Deleted

## 2024-01-01 DIAGNOSIS — I480 Paroxysmal atrial fibrillation: Secondary | ICD-10-CM

## 2024-01-01 LAB — COMPREHENSIVE METABOLIC PANEL WITH GFR
ALT: 82 U/L — ABNORMAL HIGH (ref 0–44)
AST: 83 U/L — ABNORMAL HIGH (ref 15–41)
Albumin: 2.3 g/dL — ABNORMAL LOW (ref 3.5–5.0)
Alkaline Phosphatase: 50 U/L (ref 38–126)
Anion gap: 14 (ref 5–15)
BUN: 15 mg/dL (ref 8–23)
CO2: 20 mmol/L — ABNORMAL LOW (ref 22–32)
Calcium: 8.5 mg/dL — ABNORMAL LOW (ref 8.9–10.3)
Chloride: 101 mmol/L (ref 98–111)
Creatinine, Ser: 0.78 mg/dL (ref 0.61–1.24)
GFR, Estimated: >60 mL/min (ref >60)
Glucose, Bld: 99 mg/dL (ref 70–99)
Potassium: 4.4 mmol/L (ref 3.5–5.1)
Sodium: 135 mmol/L (ref 135–145)
Total Bilirubin: 0.6 mg/dL (ref 0.0–1.2)
Total Protein: 5.6 g/dL — ABNORMAL LOW (ref 6.5–8.1)

## 2024-01-01 MED ORDER — ASPIRIN 81 MG PO TBEC
81.0000 mg | DELAYED_RELEASE_TABLET | Freq: Every day | ORAL | Status: DC
  Administered 2024-01-01: 81 mg via ORAL
  Filled 2024-01-01: qty 1

## 2024-01-01 MED ORDER — VITAMIN B-1 100 MG PO TABS: 100.0000 mg | ORAL_TABLET | Freq: Every day | ORAL | Status: AC

## 2024-01-01 MED ORDER — AMOXICILLIN 500 MG PO CAPS: 1000.0000 mg | ORAL_CAPSULE | Freq: Three times a day (TID) | ORAL | Status: AC

## 2024-01-01 MED ORDER — ACETAMINOPHEN 325 MG PO TABS: 650.0000 mg | ORAL_TABLET | Freq: Four times a day (QID) | ORAL | Status: AC | PRN

## 2024-01-01 MED ORDER — ASPIRIN 81 MG PO TBEC: 81.0000 mg | DELAYED_RELEASE_TABLET | Freq: Every day | ORAL | 0 refills | Status: AC

## 2024-01-01 MED ORDER — METOPROLOL TARTRATE 50 MG PO TABS: 50.0000 mg | ORAL_TABLET | Freq: Two times a day (BID) | ORAL | Status: AC

## 2024-01-01 MED ORDER — FOLIC ACID 1 MG PO TABS: 1.0000 mg | ORAL_TABLET | Freq: Every day | ORAL | Status: AC

## 2024-01-01 MED ORDER — PANTOPRAZOLE SODIUM 40 MG PO TBEC: 40.0000 mg | DELAYED_RELEASE_TABLET | Freq: Every day | ORAL | Status: AC

## 2024-01-01 NOTE — Discharge Instructions (Signed)
 Follow with Primary MD [name], MD in 7 days   Get CBC, CMP, Magnesium , 2 view Chest X ray -  checked next visit with your primary MD or SNF MD   Activity: As tolerated with Full fall precautions use walker/cane & assistance as needed  Disposition SNF  Diet: Heart Healthy    Special Instructions: If you have smoked or chewed Tobacco  in the last 2 yrs please stop smoking, stop any regular Alcohol   and or any Recreational drug use.  On your next visit with your primary care physician please Get Medicines reviewed and adjusted.  Please request your Prim.MD to go over all Hospital Tests and Procedure/Radiological results at the follow up, please get all Hospital records sent to your Prim MD by signing hospital release before you go home.  If you experience worsening of your admission symptoms, develop shortness of breath, life threatening emergency, suicidal or homicidal thoughts you must seek medical attention immediately by calling 911 or calling your MD immediately  if symptoms less severe.  You Must read complete instructions/literature along with all the possible adverse reactions/side effects for all the Medicines you take and that have been prescribed to you. Take any new Medicines after you have completely understood and accpet all the possible adverse reactions/side effects.   Do not drive when taking Pain medications.  Do not take more than prescribed Pain, Sleep and Anxiety Medications  Wear Seat belts while driving.

## 2024-01-01 NOTE — Progress Notes (Signed)
 Patient enrolled for [facility] to ship a 30 day cardiac event monitor to his address on file. Dr. Santo to read.

## 2024-01-01 NOTE — Plan of Care (Signed)

## 2024-01-01 NOTE — Discharge Summary (Signed)
 Discharge summary note.  Jose [name] FMW:[id number] DOB: 10-19-1935 DOA: 12/26/2023  PCP: [name], MD  Admit date: 12/26/2023  Discharge date: 01/01/2024  Admitted From: Home   Disposition:  SNF   Recommendations for Outpatient Follow-up:   Follow up with PCP in 1-2 weeks  PCP Please obtain BMP/CBC, 2 view CXR in 1week,  (see Discharge instructions)   PCP Please follow up on the following pending results:    Home Health: None   Equipment/Devices: None  Consultations: Cards Discharge Condition: Stable    CODE STATUS: Full    Diet Recommendation: Heart Healthy     Chief Complaint  Patient presents with   Altered Mental Status     Brief history of present illness from the day of admission and additional interim summary     88 yr old man who presented to [facility] ED on 12/26/2023 with complaints of chills and confusion. The patient had been feeling weak and fell about a week ago injuring his face. He has also had a cough. He was found down by his family. They state that he usually gets around fine with his walker. Family states that he has not had anything to drink for the past week.   PMHX positive for chronic fatigue, GERD, HLD, irritable spinal stenosis, CVA, daily alcohol  use (DT's a few years ago).   In the ED he was found to have a Right upper lobe infiltrate on X-ray with fever, tachypnea, and leukocytosis.  Further workup suggested he had strep pyogenes bacteremia likely due to community-acquired pneumonia and was admitted to the hospital.                                                                 Hospital Course    Sepsis with strep pyogenes bacteremia likely from community-acquired pneumonia along with multiple chronic skin breakdowns present on admission from falls.  PT  chest noted, no other subjective complaints although he is quite confused and a poor historian, ID has placed him on penicillin  G which will be continued, monitor inflammatory markers, currently afebrile and not toxic, sepsis pathophysiology improving.  Speech eval as well and monitor clinically.  Case discussed with ID Dr. Lindia in detail upon discharge will be switched to oral amoxicillin  total duration 10 days of treatment, stable echocardiogram, repeat blood cultures drawn and so far negative x 3 days.   Acute Metabolic Encephalopathy, questionable DTs  Likely hospital-acquired acquired encephalopathy on top of underlying age-related cognitive decline, at risk for delirium, overall stable, minimal intermittent confusion, supportive care continue.   Cellulitis minimal and resolved.  Antibiotics as above does have multiple skin tears which were present on admission from multiple previous falls at home, wound care and fall precautions at SNF.   Hyperlipidemia  Not on  statin.   Chronic fatigue syndrome  Hold Ritalin    Rhabdomyolysis  Improved with supportive care.   Hyponatremia.  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  Likely due to combination of pneumonia, bacteremia and some intravascular dehydration, IV fluids, continue beta-blocker, as needed diltiazem , telemetry monitor, TSH stable, stable echo, hydrated with IV fluids, Chad vas 2 score is greater than 3 however he is an alcoholic with falls, seen by cardiology.  Appreciate cardiology input, echo pending, agree with withholding anticoagulation for now, discharged on 30-day monitor and monitor on telemetry here.  Will place on aspirin .   Discharge diagnosis     Active Problems:    Sepsis (HCC)   Acute metabolic encephalopathy   Chronic fatigue syndrome   Hyperlipidemia   CAP (community acquired pneumonia)   Rhabdomyolysis   ETOH abuse   Hypokalemia   PAF (paroxysmal atrial fibrillation) (HCC)    Discharge instructions    Discharge Instructions     Diet - low sodium heart healthy   Complete by: As directed    Discharge instructions   Complete by: As directed    Follow with Primary MD [name], MD in 7 days   Get CBC, CMP, Magnesium , 2 view Chest X ray -  checked next visit with your primary MD or SNF MD   Activity: As tolerated with Full fall precautions use walker/cane & assistance as needed  Disposition SNF  Diet: Heart Healthy    Special Instructions: If you have smoked or chewed Tobacco  in the last 2 yrs please stop smoking, stop any regular Alcohol   and or any Recreational drug use.  On your next visit with your primary care physician please Get Medicines reviewed and adjusted.  Please request your Prim.MD to go over all Hospital Tests and Procedure/Radiological results at the follow up, please get all Hospital records sent to your Prim MD by signing hospital release before you go home.  If you experience worsening of your admission symptoms, develop shortness of breath, life threatening emergency, suicidal or homicidal thoughts you must seek medical attention immediately by calling 911 or calling your MD immediately  if symptoms less severe.  You Must read complete instructions/literature along with all the possible adverse reactions/side effects for all the Medicines you take and that have been prescribed to you. Take any new Medicines after you have completely understood and accpet all the possible adverse reactions/side effects.   Do not drive when taking Pain medications.  Do not take more than prescribed Pain, Sleep and Anxiety Medications  Wear Seat belts while driving.   Discharge wound care:   Complete by: As directed     Daily : Cleanse skin tear to left arm with NS and pat dry. Apply medihoney to wound bed COver with gauze and kerlix/tape  Change daily.   Increase activity slowly   Complete by: As directed        Discharge Medications   Allergies as of 01/01/2024   No Known Allergies      Medication List     STOP taking these medications    ALPRAZolam  0.5 MG tablet Commonly known as: XANAX    methylphenidate  10 MG tablet Commonly known as: RITALIN        TAKE these  medications    acetaminophen  325 MG tablet Commonly known as: TYLENOL  Take 2 tablets (650 mg total) by mouth every 6 (six) hours as needed for mild pain (pain score 1-3) (or Fever >/= 101).   amoxicillin  500 MG capsule Commonly known as: AMOXIL  Take 2 capsules (1,000 mg total) by mouth every 8 (eight) hours for 4 days.   aspirin  EC 81 MG tablet Take 1 tablet (81 mg total) by mouth daily.   augmented betamethasone  dipropionate 0.05 % cream Commonly known as: DIPROLENE -AF Apply 1 Application topically 2 (two) times daily as needed.   CORTIZONE-10 EX Apply 1 application  topically 2 (two) times daily as needed (itching).   folic acid  1 MG tablet Commonly known as: FOLVITE  Take 1 tablet (1 mg total) by mouth daily.   metoprolol  tartrate 50 MG tablet Commonly known as: LOPRESSOR  Take 1 tablet (50 mg total) by mouth 2 (two) times daily.   pantoprazole  40 MG tablet Commonly known as: PROTONIX  Take 1 tablet (40 mg total) by mouth daily.   thiamine  100 MG tablet Commonly known as: Vitamin B-1 Take 1 tablet (100 mg total) by mouth daily.               Discharge Care Instructions  (From admission, onward)           Start     Ordered   01/01/24 0000  Discharge wound care:       Comments: Daily : Cleanse skin tear to left arm with NS and pat dry. Apply medihoney to wound bed COver with gauze and kerlix/tape  Change daily.   01/01/24 0911             Follow-up Information     Monge, Damien BROCKS, NP  Follow up.   Specialties: Cardiology, Family Medicine Why: Davene Nicolas - Cardiology follow-up arranged on  Tuesday Jan 22, 2024 10:55 AM (Arrive by 10:35 AM). Contact information: [address] [place] [address] [phone]         [name], MD. Schedule an appointment as soon as possible for a visit in 1 week(s).   Specialty: Internal Medicine Contact information: [address] [place] [address] [phone]                 Major procedures and Radiology Reports - PLEASE review detailed and final reports thoroughly  -       ECHOCARDIOGRAM COMPLETE Result Date: 12/30/2023    ECHOCARDIOGRAM REPORT   Patient Name:   Jose [name] Date of Exam: 12/30/2023 Medical Rec #:  [id number]             Height:       70.0 in Accession #:    [id number]            Weight:       155.0 lb Date of Birth:  Nov 01, 1935              BSA:          1.873 m Patient Age:    88 years              BP:           79/52 mmHg Patient Gender: M                     HR:           70 bpm. Exam Location:  Inpatient Procedure: 2D Echo, Cardiac Doppler and Color  Doppler (Both Spectral and Color            Flow Doppler were utilized during procedure). Indications:    Atrial Fibrillation I48.91  History:        Patient has prior history of Echocardiogram examinations, most                 recent 11/15/2021. Stroke; Risk Factors:Sleep Apnea.  Sonographer:    Jayson Gaskins Referring Phys: JACQUELINE Girtha [facility] IMPRESSIONS  1. Left ventricular ejection fraction, by estimation, is 60 to 65%. The left ventricle has normal function. The left ventricle has no regional wall motion abnormalities. There is mild asymmetric left ventricular hypertrophy of the basal and septal segments. Left ventricular diastolic parameters were normal.  2. Right ventricular systolic function is normal. The right ventricular size is normal.  3. The mitral valve is degenerative. Trivial mitral valve regurgitation.  No evidence of mitral stenosis. Moderate mitral annular calcification.  4. The aortic valve is tricuspid. There is moderate calcification of the aortic valve. There is moderate thickening of the aortic valve. Aortic valve regurgitation is trivial. Aortic valve sclerosis is present, with no evidence of aortic valve stenosis.  5. The inferior vena cava is normal in size with greater than 50% respiratory variability, suggesting right atrial pressure of 3 mmHg. FINDINGS  Left Ventricle: Left ventricular ejection fraction, by estimation, is 60 to 65%. The left ventricle has normal function. The left ventricle has no regional wall motion abnormalities. Strain was performed and the global longitudinal strain is indeterminate. The left ventricular internal cavity size was normal in size. There is mild asymmetric left ventricular hypertrophy of the basal and septal segments. Left ventricular diastolic parameters were normal. Right Ventricle: The right ventricular size is normal. No increase in right ventricular wall thickness. Right ventricular systolic function is normal. Left Atrium: Left atrial size was normal in size. Right Atrium: Right atrial size was normal in size. Pericardium: There is no evidence of pericardial effusion. Mitral Valve: The mitral valve is degenerative in appearance. There is mild thickening of the mitral valve leaflet(s). There is mild calcification of the mitral valve leaflet(s). Moderate mitral annular calcification. Trivial mitral valve regurgitation. No evidence of mitral valve stenosis. Tricuspid Valve: The tricuspid valve is normal in structure. Tricuspid valve regurgitation is trivial. No evidence of tricuspid stenosis. Aortic Valve: The aortic valve is tricuspid. There is moderate calcification of the aortic valve. There is moderate thickening of the aortic valve. Aortic valve regurgitation is trivial. Aortic valve sclerosis is present, with no evidence of aortic valve  stenosis. Aortic  valve mean gradient measures 2.0 mmHg. Aortic valve peak gradient measures 2.7 mmHg. Aortic valve area, by VTI measures 2.26 cm. Pulmonic Valve: The pulmonic valve was normal in structure. Pulmonic valve regurgitation is trivial. No evidence of pulmonic stenosis. Aorta: The aortic root is normal in size and structure. Venous: The inferior vena cava is normal in size with greater than 50% respiratory variability, suggesting right atrial pressure of 3 mmHg. IAS/Shunts: The interatrial septum was not well visualized. Additional Comments: 3D was performed not requiring image post processing on an independent workstation and was indeterminate.  LEFT VENTRICLE PLAX 2D LVIDd:         4.20 cm   Diastology LVIDs:         2.30 cm   LV e' medial:    5.66 cm/s LV PW:         0.90 cm   LV E/e' medial:  10.9  LV IVS:        1.00 cm   LV e' lateral:   9.68 cm/s LVOT diam:     1.90 cm   LV E/e' lateral: 6.4 LV SV:         34 LV SV Index:   18 LVOT Area:     2.84 cm  RIGHT VENTRICLE RV S prime:     13.70 cm/s TAPSE (M-mode): 2.8 cm LEFT ATRIUM             Index        RIGHT ATRIUM           Index LA Vol (A2C):   24.9 ml 13.29 ml/m  RA Area:     10.50 cm LA Vol (A4C):   25.9 ml 13.83 ml/m  RA Volume:   23.00 ml  12.28 ml/m LA Biplane Vol: 26.2 ml 13.99 ml/m  AORTIC VALVE AV Area (Vmax):    2.45 cm AV Area (Vmean):   2.17 cm AV Area (VTI):     2.26 cm AV Vmax:           81.40 cm/s AV Vmean:          63.900 cm/s AV VTI:            0.149 m AV Peak Grad:      2.7 mmHg AV Mean Grad:      2.0 mmHg LVOT Vmax:         70.30 cm/s LVOT Vmean:        49.000 cm/s LVOT VTI:          0.119 m LVOT/AV VTI ratio: 0.80  AORTA Ao Root diam: 3.20 cm MITRAL VALVE MV Area (PHT): 3.26 cm    SHUNTS MV Decel Time: 233 msec    Systemic VTI:  0.12 m MV E velocity: 61.80 cm/s  Systemic Diam: 1.90 cm MV A velocity: 69.70 cm/s MV E/A ratio:  0.89 Maude Emmer MD Electronically signed by Maude Emmer MD Signature Date/Time: 12/30/2023/2:50:07 PM    Final     DG Shoulder Left Portable Result Date: 12/27/2023 EXAM: 1 VIEW(S) XRAY OF THE LEFT SHOULDER 12/27/2023 09:25:00 AM COMPARISON: None available. CLINICAL HISTORY: pain pain FINDINGS: BONES AND JOINTS: Mild degenerative changes are seen involving the left acromioclavicular and glenohumeral joints. No acute fracture or dislocation. SOFT TISSUES: No abnormal calcifications. Visualized lung is unremarkable. IMPRESSION: 1. Mild left acromioclavicular and glenohumeral osteoarthritis. Electronically signed by: Lynwood Seip MD 12/27/2023 09:43 AM EST RP Workstation: [id number]   CT CHEST ABDOMEN PELVIS W CONTRAST Result Date: 12/26/2023 CLINICAL DATA:  Trauma. EXAM: CT CHEST, ABDOMEN, AND PELVIS WITH CONTRAST TECHNIQUE: Multidetector CT imaging of the chest, abdomen and pelvis was performed following the standard protocol during bolus administration of intravenous contrast. RADIATION DOSE REDUCTION: This exam was performed according to the departmental dose-optimization program which includes automated exposure control, adjustment of the mA and/or kV according to patient size and/or use of iterative reconstruction technique. CONTRAST:  75mL OMNIPAQUE  IOHEXOL  350 MG/ML SOLN COMPARISON:  CT chest abdomen and pelvis 12/08/2021. FINDINGS: CT CHEST FINDINGS Cardiovascular: Heart is mildly enlarged. There is no pericardial effusion. Aorta is normal in size. There are atherosclerotic calcifications of the aorta and coronary arteries. Mediastinum/Nodes: No enlarged mediastinal, hilar, or axillary lymph nodes. Thyroid  gland, trachea, and esophagus demonstrate no significant findings. Lungs/Pleura: There is dependent atelectasis in the bilateral lower lobes. There are patchy ground-glass opacities in the right upper lobe, likely infectious/inflammatory. There is no pleural effusion  or pneumothorax. Musculoskeletal: No acute fractures are seen. No focal body wall hematoma. CT ABDOMEN PELVIS FINDINGS Hepatobiliary: No hepatic  injury or perihepatic hematoma. Gallbladder is unremarkable. Pancreas: Unremarkable. No pancreatic ductal dilatation or surrounding inflammatory changes. Spleen: No splenic injury or perisplenic hematoma. Adrenals/Urinary Tract: There is a 16 mm cyst in the left kidney. Otherwise, the kidneys, adrenal glands and bladder are within normal limits. Stomach/Bowel: Stomach is within normal limits. No evidence of bowel wall thickening, distention, or inflammatory changes. There is diffuse colonic diverticulosis. The appendix is not seen. Vascular/Lymphatic: The prostate is mildly enlarged. Reproductive: Uterus and bilateral adnexa are unremarkable. Other: No abdominal wall hernia or abnormality. No abdominopelvic ascites. Musculoskeletal: No acute fractures are seen. IMPRESSION: 1. No acute posttraumatic sequelae in the chest, abdomen or pelvis. 2. Patchy ground-glass opacities in the right upper lobe, likely infectious/inflammatory. 3. Colonic diverticulosis. 4. Mild cardiomegaly. 5. Aortic atherosclerosis. Aortic Atherosclerosis (ICD10-I70.0). Electronically Signed   By: Greig Pique M.D.   On: 12/26/2023 20:14   CT Maxillofacial Wo Contrast Result Date: 12/26/2023 CLINICAL DATA:  Status post fall with altered mental status. EXAM: CT MAXILLOFACIAL W/O CM TECHNIQUE: Multidetector CT imaging of the maxillofacial structures was performed. Multiplanar CT image reconstructions were also generated. RADIATION DOSE REDUCTION: This exam was performed according to the departmental dose-optimization program which includes automated exposure control, adjustment of the mA and/or kV according to patient size and/or use of iterative reconstruction technique. COMPARISON:  None Available. FINDINGS: Osseous: No fracture or mandibular dislocation. No destructive process. Orbits: Negative. No traumatic or inflammatory finding. Sinuses: Clear. Soft tissues: There is mild left supra orbital soft tissue swelling. Limited intracranial: No  significant or unexpected finding. IMPRESSION: Mild left supra orbital soft tissue swelling without evidence of an acute fracture or dislocation. Electronically Signed   By: Suzen Dials M.D.   On: 12/26/2023 19:59   CT Head Wo Contrast Result Date: 12/26/2023 CLINICAL DATA:  Possible fall with altered mental status. EXAM: CT HEAD WITHOUT CONTRAST TECHNIQUE: Contiguous axial images were obtained from the base of the skull through the vertex without intravenous contrast. RADIATION DOSE REDUCTION: This exam was performed according to the departmental dose-optimization program which includes automated exposure control, adjustment of the mA and/or kV according to patient size and/or use of iterative reconstruction technique. COMPARISON:  None Available. FINDINGS: Brain: There is generalized cerebral atrophy with widening of the extra-axial spaces and ventricular dilatation. There are areas of decreased attenuation within the white matter tracts of the supratentorial brain, consistent with microvascular disease changes. Vascular: Marked severity bilateral cavernous carotid artery calcification is noted. Skull: Normal. Negative for fracture or focal lesion. Sinuses/Orbits: No acute finding. Other: Mild left supra orbital soft tissue swelling is seen. IMPRESSION: 1. Generalized cerebral atrophy and microvascular disease changes of the supratentorial brain. 2. No acute intracranial abnormality. 3. Mild left supra orbital soft tissue swelling. Electronically Signed   By: Suzen Dials M.D.   On: 12/26/2023 19:58   CT Cervical Spine Wo Contrast Result Date: 12/26/2023 CLINICAL DATA:  Possible fall with altered mental status. EXAM: CT CERVICAL SPINE WITHOUT CONTRAST TECHNIQUE: Multidetector CT imaging of the cervical spine was performed without intravenous contrast. Multiplanar CT image reconstructions were also generated. RADIATION DOSE REDUCTION: This exam was performed according to the departmental  dose-optimization program which includes automated exposure control, adjustment of the mA and/or kV according to patient size and/or use of iterative reconstruction technique. COMPARISON:  November 25, 2018 FINDINGS: Alignment: There is reversal of the normal cervical  spine lordosis. 2 mm retrolisthesis of the C4 vertebral body is noted on C5. Skull base and vertebrae: No acute fracture. No primary bone lesion or focal pathologic process. Soft tissues and spinal canal: No prevertebral fluid or swelling. No visible canal hematoma. Disc levels: Marked severity endplate sclerosis, anterior osteophyte formation and posterior bony spurring are seen throughout all levels of the cervical spine. Marked severity multilevel intervertebral disc space narrowing is also present throughout the cervical spine. Bilateral marked severity multilevel facet joint hypertrophy is noted. Upper chest: There is mild biapical linear scarring and/or atelectasis. Other: None. IMPRESSION: 1. No acute cervical spine fracture. 2. Marked severity multilevel degenerative changes throughout the cervical spine. Electronically Signed   By: Suzen Dials M.D.   On: 12/26/2023 19:56   DG Chest Port 1 View Result Date: 12/26/2023 CLINICAL DATA:  Questionable sepsis - evaluate for abnormality EXAM: DG CHEST 1V PORT COMPARISON:  December 08, 2021 FINDINGS: Lower lung volumes. Elevation of the right hemidiaphragm. No focal airspace consolidation, pleural effusion, or pneumothorax. No cardiomegaly.Tortuous aorta with aortic atherosclerosis.No acute fracture or destructive lesion. Multilevel thoracic osteophytosis. IMPRESSION: Low lung volumes.  Otherwise, no acute cardiopulmonary abnormality. Electronically Signed   By: Rogelia Myers M.D.   On: 12/26/2023 19:07   DG Femur Min 2 Views Right Result Date: 12/26/2023 CLINICAL DATA:  Questionable sepsis - evaluate for abnormality, fall EXAM: RIGHT FEMUR 2 VIEWS COMPARISON:  None Available. FINDINGS: No  acute fracture or dislocation. Small knee joint effusion. There is no evidence of arthropathy or other focal bone abnormality. Peripheral vascular atherosclerosis. IMPRESSION: No acute fracture or dislocation. Electronically Signed   By: Rogelia Myers M.D.   On: 12/26/2023 19:06   DG Forearm Left Result Date: 12/26/2023 CLINICAL DATA:  fall EXAM: LEFT FOREARM - 2 VIEW COMPARISON:  None Available. FINDINGS: No acute fracture or dislocation. There is no evidence of arthropathy or other focal bone abnormality. Soft tissues are unremarkable. IMPRESSION: No acute fracture or dislocation. Electronically Signed   By: Rogelia Myers M.D.   On: 12/26/2023 19:05   Today   Subjective    Jose [name] today has no headache,no chest abdominal pain,no new weakness tingling or numbness, feels much better     Objective   Blood pressure 110/62, pulse 67, temperature 98.1 F (36.7 C), temperature source Oral, resp. rate 20, height 5' 10 (1.778 m), weight 70.3 kg, SpO2 98%.   Intake/Output Summary (Last 24 hours) at 01/01/2024 0911 Last data filed at 01/01/2024 0409 Gross per 24 hour  Intake --  Output 1000 ml  Net -1000 ml    Exam  Awake, mildly confused no new F.N deficits,    Delta.AT,PERRAL Supple Neck,   Symmetrical Chest wall movement, Good air movement bilaterally, CTAB RRR,No Gallops,   +ve B.Sounds, Abd Soft, Non tender,  No Cyanosis, Clubbing or edema    Data Review   Recent Labs  Lab 12/26/23 1637 12/27/23 1236 12/28/23 0254 12/29/23 0341 12/30/23 1155 12/31/23 0253  WBC 12.5* 10.8* 9.2 9.1 7.0 7.6  HGB 14.9 12.5* 11.9* 12.2* 11.8* 11.6*  HCT 43.3 36.8* 34.0* 36.2* 35.0* 34.2*  PLT 155 123* 123* 149* 196 222  MCV 87.5 87.4 85.6 85.6 87.5 86.6  MCH 30.1 29.7 30.0 28.8 29.5 29.4  MCHC 34.4 34.0 35.0 33.7 33.7 33.9  RDW 14.3 14.6 14.5 14.5 14.6 14.6  LYMPHSABS 0.4*  --  0.8 0.8 0.7 1.1  MONOABS 1.2*  --  1.3* 1.2* 1.0 1.3*  EOSABS 0.0  --  0.0 0.0 0.0 0.1  BASOSABS  0.0  --  0.0 0.0 0.0 0.0    Recent Labs  Lab 12/26/23 1634 12/26/23 1637 12/26/23 1637 12/26/23 1700 12/26/23 2312 12/27/23 1236 12/28/23 0254 12/28/23 0904 12/29/23 0341 12/30/23 1155 12/31/23 0253 01/01/24 0353  NA  --  132*   < >  --   --  131* 127*  --  129* 135 138 135  K  --  3.4*   < >  --   --  3.8 3.6  --  3.7 3.6 4.4 4.4  CL  --  97*   < >  --   --  98 93*  --  96* 102 104 101  CO2  --  25   < >  --   --  24 23  --  25 25 25  20*  ANIONGAP  --  10   < >  --   --  9 11  --  8 8 9 14   GLUCOSE  --  121*   < >  --   --  109* 109*  --  118* 130* 101* 99  BUN  --  13   < >  --   --  10 12  --  14 11 14 15   CREATININE  --  1.02   < >  --   --  0.91 0.91  --  0.80 0.75 0.78 0.78  AST  --  57*   < >  --   --  64*  --   --  62* 81* 69* 83*  ALT  --  24   < >  --   --  30  --   --  37 67* 68* 82*  ALKPHOS  --  62   < >  --   --  52  --   --  49 53 49 50  BILITOT  --  0.8   < >  --   --  0.8  --   --  0.5 0.8 0.5 0.6  ALBUMIN  --  3.2*   < >  --   --  2.4*  --   --  2.4* 2.3* 2.2* 2.3*  CRP  --   --   --   --   --   --   --  23.6* 15.9* 11.8* 11.4*  --   PROCALCITON  --   --   --   --  8.62  --  7.17  --  5.28 2.19 1.60  --   LATICACIDVEN  --   --   --  1.9  --   --   --   --   --   --   --   --   INR  --  1.1  --   --   --   --   --   --   --   --   --   --   TSH 1.815  --   --   --   --   --   --   --  3.506  --   --   --   AMMONIA  --   --   --   --  <13  --   --   --   --   --   --   --   MG  --   --   --   --  1.4* 1.7  --   --  2.1 1.9 1.9  --   PHOS  --   --   --   --  3.0 2.4*  --   --  2.4* 2.6 3.0  --   CALCIUM   --  9.0   < >  --   --  8.0* 7.8*  --  7.7* 8.2* 8.4* 8.5*   < > = values in this interval not displayed.    Total Time in preparing paper work, data evaluation and todays exam - 35 minutes  Signature  -    Lavada Stank M.D on 01/01/2024 at 9:11 AM   -  To page go to [email or web address]

## 2024-01-01 NOTE — Progress Notes (Signed)
 Physical Therapy Treatment Patient Details Name: Jose [name] MRN: [id number] DOB: 10/20/1935 Today's Date: 01/01/2024   History of Present Illness 88 y/o male admitted 12/3 with chills, confusion, cough and recent fall.  CT showed suspected PNA in the R upper lobe.  Pt is significant L shoulder pain, no fx's on imaging.   PMHx, R knee meniscal tear, CAP, IBS, STROKE.    PT Comments  Session focused on standing balance and stand pivot transfers. Pt demonstrates difficulty sequencing movements needed for transfers and stepping to [facility] and chair. Pt has difficulty following verbal cues and becomes easily frustrated during session. Pt demonstrates good sitting balance while on BSC, though posterior trunk lean noted upon returning to bed after session. Pt tolerates static stance x2 assist for ~2 minutes while peri care is performed. Pt tolerated ~30 minutes of sitting in recliner prior to returning to bed. PT returned with CNA to return pt to bed via sara stedy due to difficulty with sequencing and safety awareness with mobility. Pt would benefit from continued PT services focused on bed mobility, transfers, strengthening, and gait to promote improved functional mobility.      If plan is discharge home, recommend the following: Two people to help with walking and/or transfers;Two people to help with bathing/dressing/bathroom;Assistance with cooking/housework;Direct supervision/assist for medications management;Direct supervision/assist for financial management;Assist for transportation;Help with stairs or ramp for entrance;Supervision due to cognitive status   Can travel by private vehicle     No  Equipment Recommendations  Rolling walker (2 wheels)    Recommendations for Other Services       Precautions / Restrictions Precautions Precautions: Fall Recall of Precautions/Restrictions: Impaired Restrictions Weight Bearing Restrictions Per Provider Order: No     Mobility  Bed  Mobility Overal bed mobility: Needs Assistance Bed Mobility: Supine to Sit, Sit to Supine     Supine to sit: Mod assist Sit to supine: Contact guard assist   General bed mobility comments: Trunk assist needed for supine to sit. Pt does well returning to bed and follows one step cues to reposition.    Transfers Overall transfer level: Needs assistance Equipment used: 2 person hand held assist Transfers: Sit to/from Stand, Bed to chair/wheelchair/BSC Sit to Stand: Mod assist, +2 physical assistance Stand pivot transfers: Mod assist, +2 physical assistance         General transfer comment: Pt with difficulty sequencing steps for stand pivot. Pt in forward flexed posture throughout transfer. Stand pivot x2 completed with hand held assist to transfer bed to commode and commode to chair. Camie stedy used to return to bed with CNA after session for safety. Transfer via Lift Equipment: Stedy  Ambulation/Gait                   Stairs             Wheelchair Mobility     Tilt Bed    Modified Rankin (Stroke Patients Only)       Balance Overall balance assessment: Needs assistance Sitting-balance support: Bilateral upper extremity supported Sitting balance-Leahy Scale: Fair Sitting balance - Comments: Pt with posterior trunk lean upon returning to bed but is able to correct with cues. Postural control: Posterior lean Standing balance support: Bilateral upper extremity supported Standing balance-Leahy Scale: Poor Standing balance comment: Pt with significant forward posture upon standing, does not follow cues to extend hips. Pt relies heavily on hand held assist x2 to maintain balance while standing and pivoting.  Communication Communication Communication: No apparent difficulties  Cognition Arousal: Alert Behavior During Therapy: Lability   PT - Cognitive impairments: Orientation, Awareness, Safety/Judgement, Problem  solving, Sequencing, Attention, Memory   Orientation impairments: Place, Time, Situation                   PT - Cognition Comments: Pt easily frustrated during session and startles easily to sudden contact/sound. Following commands: Impaired Following commands impaired: Follows one step commands inconsistently    Cueing Cueing Techniques: Verbal cues, Tactile cues, Visual cues  Exercises      General Comments General comments (skin integrity, edema, etc.): Peri care performed during session. VSS throughout.      Pertinent Vitals/Pain Pain Assessment Pain Assessment: Faces Faces Pain Scale: Hurts even more Pain Location: L shoulder Pain Descriptors / Indicators: Grimacing, Sharp, Sore Pain Intervention(s): Limited activity within patient's tolerance, Monitored during session, Repositioned    Home Living                          Prior Function            PT Goals (current goals can now be found in the care plan section) Progress towards PT goals: Progressing toward goals    Frequency    Min 3X/week      PT Plan      Co-evaluation              AM-PAC PT 6 Clicks Mobility   Outcome Measure  Help needed turning from your back to your side while in a flat bed without using bedrails?: A Little Help needed moving from lying on your back to sitting on the side of a flat bed without using bedrails?: A Lot Help needed moving to and from a bed to a chair (including a wheelchair)?: A Lot Help needed standing up from a chair using your arms (e.g., wheelchair or bedside chair)?: A Little Help needed to walk in hospital room?: Total Help needed climbing 3-5 steps with a railing? : Total 6 Click Score: 12    End of Session   Activity Tolerance: Patient limited by pain;Patient limited by fatigue (Limited by patient frustration.) Patient left: in chair;with call bell/phone within reach (After session, returned with CNA to move patient back to bed.  Bed alarm set and call bell in reach, approrpriate railings raised.) Nurse Communication: Mobility status PT Visit Diagnosis: Unsteadiness on feet (R26.81);Other abnormalities of gait and mobility (R26.89);Muscle weakness (generalized) (M62.81)     Time: 9092-9049 PT Time Calculation (min) (ACUTE ONLY): 43 min  Charges:    $Therapeutic Activity: 38-52 mins PT General Charges $$ ACUTE PT VISIT: 1 Visit                     Sabra Morel, PT, DPT  Acute Rehabilitation Services         Office: [phone]     Sabra MARLA Morel 01/01/2024, 11:59 AM

## 2024-01-01 NOTE — TOC Transition Note (Signed)
 Transition of Care [facility]) - Discharge Note   Patient Details  Name: Jose [name] MRN: [id number] Date of Birth: Oct 06, 1935  [facility]) CM/SW Contact:  Luann SHAUNNA Cumming, LCSW Phone Number: 01/01/2024, 2:06 PM   Clinical Narrative:     Met with pt and pt's son bedside. Pt initially resistant to go to SNF though eventually agreeable. [facility] ready to admit pt.    Per MD patient ready for DC to [facility]. RN, patient, patient's family, and facility notified of DC. Discharge Summary and FL2 sent to facility. RN to call report prior to discharge [phone]). DC packet on chart. Ambulance transport requested for patient.   CSW will sign off for now as social work intervention is no longer needed. Please consult us  again if new needs arise.   Final next level of care: Skilled Nursing Facility Barriers to Discharge: No Barriers Identified   Patient Goals and CMS Choice            Discharge Placement                       Discharge Plan and Services Additional resources added to the After Visit Summary for                                       Social Drivers of Health (SDOH) Interventions SDOH Screenings   Food Insecurity: No Food Insecurity (09/05/2023)  Housing: Unknown (09/05/2023)  Transportation Needs: No Transportation Needs (09/05/2023)  Utilities: Not At Risk (09/05/2023)  Alcohol  Screen: Low Risk  (09/05/2023)  Depression (PHQ2-9): High Risk (10/16/2023)  Financial Resource Strain: Low Risk  (09/05/2023)  Physical Activity: Sufficiently Active (09/05/2023)  Social Connections: Socially Isolated (09/05/2023)  Stress: No Stress Concern Present (09/05/2023)  Tobacco Use: Medium Risk (12/26/2023)  Health Literacy: Adequate Health Literacy (09/05/2023)     Readmission Risk Interventions     No data to display

## 2024-01-01 NOTE — Progress Notes (Signed)
 Ordering 30 day monitor for A-Fib. Results to Dr. Santo

## 2024-01-03 LAB — CULTURE, BLOOD (ROUTINE X 2)
Culture: NO GROWTH
Culture: NO GROWTH
Special Requests: ADEQUATE

## 2024-01-07 ENCOUNTER — Emergency Department (HOSPITAL_COMMUNITY)
Admission: EM | Admit: 2024-01-07 | Discharge: 2024-01-07 | Disposition: A | Discharge status: Skilled Nursing Facility | Attending: Emergency Medicine | Admitting: Emergency Medicine

## 2024-01-07 DIAGNOSIS — R55 Syncope and collapse: Secondary | ICD-10-CM

## 2024-01-07 DIAGNOSIS — Z7982 Long term (current) use of aspirin: Secondary | ICD-10-CM | POA: Diagnosis not present

## 2024-01-07 DIAGNOSIS — E871 Hypo-osmolality and hyponatremia: Secondary | ICD-10-CM

## 2024-01-07 LAB — CBC
HCT: 36.4 % — ABNORMAL LOW (ref 39.0–52.0)
Hemoglobin: 12.1 g/dL — ABNORMAL LOW (ref 13.0–17.0)
MCH: 30.1 pg (ref 26.0–34.0)
MCHC: 33.2 g/dL (ref 30.0–36.0)
MCV: 90.5 fL (ref 80.0–100.0)
Platelets: 488 K/uL — ABNORMAL HIGH (ref 150–400)
RBC: 4.02 MIL/uL — ABNORMAL LOW (ref 4.22–5.81)
RDW: 14.1 % (ref 11.5–15.5)
WBC: 8.8 K/uL (ref 4.0–10.5)
nRBC: 0 % (ref 0.0–0.2)

## 2024-01-07 LAB — COMPREHENSIVE METABOLIC PANEL WITH GFR
ALT: 40 U/L (ref 0–44)
AST: 33 U/L (ref 15–41)
Albumin: 2.7 g/dL — ABNORMAL LOW (ref 3.5–5.0)
Alkaline Phosphatase: 66 U/L (ref 38–126)
Anion gap: 9 (ref 5–15)
BUN: 16 mg/dL (ref 8–23)
CO2: 25 mmol/L (ref 22–32)
Calcium: 8.3 mg/dL — ABNORMAL LOW (ref 8.9–10.3)
Chloride: 93 mmol/L — ABNORMAL LOW (ref 98–111)
Creatinine, Ser: 0.8 mg/dL (ref 0.61–1.24)
GFR, Estimated: >60 mL/min (ref >60)
Glucose, Bld: 99 mg/dL (ref 70–99)
Potassium: 4.2 mmol/L (ref 3.5–5.1)
Sodium: 127 mmol/L — ABNORMAL LOW (ref 135–145)
Total Bilirubin: 0.8 mg/dL (ref 0.0–1.2)
Total Protein: 6.5 g/dL (ref 6.5–8.1)

## 2024-01-07 NOTE — Discharge Instructions (Signed)
 Your testing showed that your salt level was 127 which is slightly low and similar to what it was when you were in the hospital last week.  There was no changes in your blood counts, no signs of infection, no signs of heart problems, all of your other tests been normal, your blood pressure has been normal.  Please make sure that the provider at the facility evaluates you within the next 24 to 48 hours.  Thank you for allowing us  to treat you in the emergency department today.  After reviewing your examination and potential testing that was done it appears that you are safe to go home.  I would like for you to follow-up with your doctor within the next several days, have them obtain your records and follow-up with them to review all potential tests and results from your visit.  If you should develop severe or worsening symptoms return to the emergency department immediately

## 2024-01-07 NOTE — ED Notes (Signed)
 When you have time, Jenkins Brasil [phone] (significant other) would like an update on pt. Thank you

## 2024-01-07 NOTE — ED Notes (Signed)
 [facility] called. Pt added to the list. Pt currently 4th in line to be picked up.

## 2024-01-07 NOTE — ED Notes (Signed)
 Called CCMD and verified patient on cardiac telemetry

## 2024-01-07 NOTE — ED Provider Notes (Signed)
 [place] EMERGENCY DEPARTMENT AT [facility] Provider Note   CSN: [id number] Arrival date & time: 01/07/24  1644     Patient presents with: Loss of Consciousness   Jose [name] is a 88 y.o. male.    Loss of Consciousness    This patient is a elderly 88 year old male coming from a skilled nursing facility where he is currently living for rehab, he had a syncopal episode while he was being lifted out of the bed and went unresponsive for a short time, when he went back into the bed he was immediately regained consciousness and is now back to baseline.  He does have mild dementia, his pressure was initially 90/60 but corrected almost immediately without any interventions.  When I asked the patient how he feels he states terrible but cannot tell me what feels bad, he states I always feel terrible and reports that he has no chest pain shortness of breath fever chills nausea vomiting or diarrhea.  He does not know what happened, does not know where he is and does not know why he is here which is evidently his norm  Prior to Admission medications  Medication Sig Start Date End Date Taking? Authorizing Provider  acetaminophen  (TYLENOL ) 325 MG tablet Take 2 tablets (650 mg total) by mouth every 6 (six) hours as needed for mild pain (pain score 1-3) (or Fever >/= 101). 01/01/24   Singh, Prashant K, MD  aspirin  EC 81 MG tablet Take 1 tablet (81 mg total) by mouth daily. 01/01/24   Singh, Prashant K, MD  augmented betamethasone  dipropionate (DIPROLENE -AF) 0.05 % cream Apply 1 Application topically 2 (two) times daily as needed.    [provider]  folic acid  (FOLVITE ) 1 MG tablet Take 1 tablet (1 mg total) by mouth daily. 01/01/24   Singh, Prashant K, MD  Hydrocortisone  (CORTIZONE-10 EX) Apply 1 application  topically 2 (two) times daily as needed (itching).    [provider]  metoprolol  tartrate (LOPRESSOR ) 50 MG tablet Take 1 tablet (50 mg total) by mouth 2  (two) times daily. 01/01/24   Singh, Prashant K, MD  pantoprazole  (PROTONIX ) 40 MG tablet Take 1 tablet (40 mg total) by mouth daily. 01/01/24   Singh, Prashant K, MD  thiamine  (VITAMIN B-1) 100 MG tablet Take 1 tablet (100 mg total) by mouth daily. 01/01/24   Singh, Prashant K, MD    Allergies: Patient has no known allergies.    Review of Systems  Cardiovascular:  Positive for syncope.  All other systems reviewed and are negative.   Updated Vital Signs BP 131/72 (BP Location: Right Arm)   Pulse 76   Temp 97.6 F (36.4 C) (Oral)   Resp 14   SpO2 97%   Physical Exam Vitals and nursing note reviewed.  Constitutional:      General: He is not in acute distress.    Appearance: He is well-developed.  HENT:     Head: Normocephalic and atraumatic.     Mouth/Throat:     Pharynx: No oropharyngeal exudate.  Eyes:     General: No scleral icterus.       Right eye: No discharge.        Left eye: No discharge.     Conjunctiva/sclera: Conjunctivae normal.     Pupils: Pupils are equal, round, and reactive to light.  Neck:     Thyroid : No thyromegaly.     Vascular: No JVD.  Cardiovascular:     Rate and Rhythm: Normal  rate and regular rhythm.     Heart sounds: Normal heart sounds. No murmur heard.    No friction rub. No gallop.  Pulmonary:     Effort: Pulmonary effort is normal. No respiratory distress.     Breath sounds: Normal breath sounds. No wheezing or rales.  Abdominal:     General: Bowel sounds are normal. There is no distension.     Palpations: Abdomen is soft. There is no mass.     Tenderness: There is no abdominal tenderness.  Musculoskeletal:        General: No tenderness. Normal range of motion.     Cervical back: Normal range of motion and neck supple.     Right lower leg: No edema.     Left lower leg: No edema.  Lymphadenopathy:     Cervical: No cervical adenopathy.  Skin:    General: Skin is warm and dry.     Findings: No erythema or rash.  Neurological:      Mental Status: He is alert.     Coordination: Coordination normal.  Psychiatric:        Behavior: Behavior normal.     (all labs ordered are listed, but only abnormal results are displayed) Labs Reviewed  COMPREHENSIVE METABOLIC PANEL WITH GFR - Abnormal; Notable for the following components:      Result Value   Sodium 127 (*)    Chloride 93 (*)    Calcium  8.3 (*)    Albumin 2.7 (*)    All other components within normal limits  CBC - Abnormal; Notable for the following components:   RBC 4.02 (*)    Hemoglobin 12.1 (*)    HCT 36.4 (*)    Platelets 488 (*)    All other components within normal limits  URINALYSIS, ROUTINE W REFLEX MICROSCOPIC  CBG MONITORING, ED    EKG: EKG Interpretation Date/Time:  Monday January 07 2024 17:12:48 EST Ventricular Rate:  72 PR Interval:  184 QRS Duration:  76 QT Interval:  392 QTC Calculation: 429 R Axis:   6  Text Interpretation: Normal sinus rhythm Cannot rule out Anterior infarct , age undetermined Abnormal ECG Confirmed by Freddi Hamilton [phone]) on 01/07/2024 5:26:11 PM  Radiology: No results found.   Procedures   Medications Ordered in the ED - No data to display                                  Medical Decision Making Amount and/or Complexity of Data Reviewed Labs: ordered.   The patient has an EKG that is in sinus rhythm, there is no arrhythmias, intervals are normal, it is unclear exactly what happened to her but it sounds like it was a brief syncopal episode.  Prior workup and medical history reviewed, he had been admitted to the hospital for sepsis 11 days ago, this was related to pneumonia, he finished his medications according to the paperwork, he is not hypoxic nor is he tachycardic or febrile  Labs:  I  personally viewed and interpreted the labs which show mild hyponatremia, this has been present as recently as that when he was admitted for his last admission at 127.  This appears overall unchanged and minimally  depressed compared to baseline.  His albumin is always low, LFTs are otherwise normal, CBC shows no leukocytosis, no significant anemia, mild thrombocytosis.  The patient is awake and alert, he is able to follow commands, I  do not see any focal abnormalities, his vital signs are stable, stable for discharge      Final diagnoses:  Syncope, unspecified syncope type  Hyponatremia    ED Discharge Orders     None          Cleotilde Rogue, MD 01/07/24 1907

## 2024-01-07 NOTE — ED Triage Notes (Signed)
 Patient from [facility] where he is staying for rehab. Had syncopal episode while he was being lifted out of the bed and went unresponsive. They put him back in the bed and he immediately regained consciousness and is back to his baseline with mild dementia. Initially 90/60 with improvement without intervention.

## 2024-01-10 ENCOUNTER — Telehealth: Payer: Self-pay | Admitting: Internal Medicine

## 2024-01-10 NOTE — Telephone Encounter (Signed)
 Pt's significant other Gerard) would like a c/b to discuss orders to wear a heart monitor. Pt currently is in a rehab facility [facility] please advise

## 2024-01-10 NOTE — Telephone Encounter (Signed)
 Spoke to wife.  She took the monitor, that arrived in the mail, to [facility] (pt is currently in rehab).  They told her they would need an order to place it on pt.  Aware I will call [facility] and get their fax number to fax order as to facilitate applying monitor. Wife appreciates the call.  Faxed order, confirmation received.

## 2024-01-22 ENCOUNTER — Ambulatory Visit: Admitting: Nurse Practitioner

## 2024-01-22 ENCOUNTER — Ambulatory Visit: Attending: Internal Medicine

## 2024-01-22 DIAGNOSIS — I480 Paroxysmal atrial fibrillation: Secondary | ICD-10-CM

## 2024-01-23 ENCOUNTER — Ambulatory Visit: Payer: Self-pay | Admitting: Internal Medicine

## 2024-01-23 DIAGNOSIS — I480 Paroxysmal atrial fibrillation: Secondary | ICD-10-CM | POA: Diagnosis not present

## 2024-01-31 ENCOUNTER — Telehealth: Payer: Self-pay

## 2024-01-31 NOTE — Telephone Encounter (Signed)
 Copied from CRM [id number]. Topic: General - Other >> Jan 31, 2024  4:14 PM Zebedee SAUNDERS wrote: Reason for CRM: Received call from [facility] per [place] ph: [phone] pt has refused PT at this time.

## 2024-02-04 ENCOUNTER — Telehealth: Payer: Self-pay

## 2024-02-04 NOTE — Telephone Encounter (Signed)
 Copied from CRM [id number]. Topic: Clinical - Medication Question >> Feb 04, 2024  9:34 AM Burnard DEL wrote: Reason for CRM: Patient Jose [name] called in stating  that his caretaker Jose [name] is concerned about the amount of xanax  that patient is taking . She thinks that he may be taking too many and may need to be winged off of the medication. She is requesting a call from medical assistant.

## 2024-02-05 ENCOUNTER — Inpatient Hospital Stay: Admitting: Internal Medicine

## 2024-02-05 ENCOUNTER — Telehealth: Payer: Self-pay

## 2024-02-05 NOTE — Progress Notes (Unsigned)
 " Cardiology Office Note:    Date:  02/05/2024   ID:  Jose [name], DOB 1936-01-14, MRN [id number]  PCP:  [name], MD   [facility] Providers Cardiologist:  None { Click to update primary MD,subspecialty MD or APP then REFRESH:1}    Referring MD: [name], MD   Chief complaint: Follow-up new onset A-fib with RVR     History of Present Illness:   Jose [name] is a 89 y.o. male with a hx of CVA, HLD, OSA, GERD, alcohol  abuse, presenting today for follow-up of recent hospitalization following an episode of A-fib with RVR.  Patient was found down on 12/27/2023 by his family, experiencing weakness for about a week prior.  Presented to the ED in acute metabolic encephalopathy and rhabdomyolysis, with sepsis secondary to left knee cellulitis.  A-fib with RVR observed on 12/29/2023, later spontaneously converted to sinus rhythm.  Echo 12/30/2023: LVEF 60-65%, no RWMA, mild LVH, normal diastolic parameters, normal RV, moderate AV sclerosis and thickening, trivial regurgitation, no stenosis.  Anticoagulation was held off and the time of hospitalization due to fall risks and need for alcohol  cessation.  Discharged on 30-day telemetry monitor and started on aspirin .  30-day event monitor showed minimum heart rate 66, max heart rate 127, average heart rate 97, predominantly underlying sinus rhythm, frequent isolated PACs (6%), isolated PVCs rare, no triggered events, no arrhythmias, no atrial fibrillation.  Atrial fibrillation  ROS:   Please see the history of present illness.    *** All other systems reviewed and are negative.     Past Medical History:  Diagnosis Date   Acute meniscal tear of knee    RIGHT KNEE   Arthritis    Cancer (HCC)    skin   CAP (community acquired pneumonia) 12/08/2021   GERD (gastroesophageal reflux disease) 11/19/2020   Hives    PER PT UNKNOWN WHY   Hyperlipidemia 12/08/2021   IBS (irritable bowel syndrome)     Mild sleep apnea    PER STUDY  11-30-2010--  NO CPAP   Neuromuscular disorder (HCC)    Spinal stenosis    Stroke (HCC)    unsure per pt    Past Surgical History:  Procedure Laterality Date   APPENDECTOMY     COLONOSCOPY     HEMORRHOID SURGERY     KNEE ARTHROSCOPY Bilateral    x4   KNEE ARTHROSCOPY WITH LATERAL MENISECTOMY Right 04/12/2012   Procedure: RIGHT KNEE ARTHROSCOPY WITH PARTIAL MEDIAL AND LATERAL MENISECTOMY;  Surgeon: [name], MD;  Location: [facility];  Service: Orthopedics;  Laterality: Right;   REVERSE SHOULDER ARTHROPLASTY Right 10/13/2022   Procedure: REVERSE SHOULDER ARTHROPLASTY;  Surgeon: [name], MD;  Location: [facility];  Service: Orthopedics;  Laterality: Right;  interscalene block flip room   SHOULDER ARTHROSCOPY WITH ROTATOR CUFF REPAIR AND SUBACROMIAL DECOMPRESSION  12-21-1999  &  01-28-2004    Current Medications: Active Medications[1]   Allergies:   Patient has no known allergies.   Social History   Socioeconomic History   Marital status: Divorced    Spouse name: Not on file   Number of children: Not on file   Years of education: Not on file   Highest education level: Not on file  Occupational History   Occupation: RETIRED/Writer  Tobacco Use   Smoking status: Former    Types: Cigarettes   Smokeless tobacco: Never   Tobacco comments:    QUIT SMOKING 40 YRS AGO  Vaping Use   Vaping status: Never Used  Substance and Sexual Activity   Alcohol  use: Yes    Comment: ONE BOTTLE WINE PER DAY   Drug use: No   Sexual activity: Not on file  Other Topics Concern   Not on file  Social History Narrative   Right handed   Lives alone/2025   Has a SO Gerard)   Social Drivers of Health   Tobacco Use: Medium Risk (12/26/2023)   Patient History    Smoking Tobacco Use: Former    Smokeless Tobacco Use: Never    Passive Exposure: Not on file  Financial Resource Strain: Low Risk (09/05/2023)   Overall Financial Resource  Strain (CARDIA)    Difficulty of Paying Living Expenses: Not hard at all  Food Insecurity: No Food Insecurity (09/05/2023)   Epic    Worried About Radiation Protection Practitioner of Food in the Last Year: Never true    Ran Out of Food in the Last Year: Never true  Transportation Needs: No Transportation Needs (09/05/2023)   Epic    Lack of Transportation (Medical): No    Lack of Transportation (Non-Medical): No  Physical Activity: Sufficiently Active (09/05/2023)   Exercise Vital Sign    Days of Exercise per Week: 6 days    Minutes of Exercise per Session: 60 min  Stress: No Stress Concern Present (09/05/2023)   [employer] of Occupational Health - Occupational Stress Questionnaire    Feeling of Stress: Only a little  Social Connections: Socially Isolated (09/05/2023)   Social Connection and Isolation Panel    Frequency of Communication with Friends and Family: More than three times a week    Frequency of Social Gatherings with Friends and Family: More than three times a week    Attends Religious Services: Never    Database Administrator or Organizations: No    Attends Banker Meetings: Never    Marital Status: Divorced  Depression (PHQ2-9): High Risk (10/16/2023)   Depression (PHQ2-9)    PHQ-2 Score: 17  Alcohol  Screen: Low Risk (09/05/2023)   Alcohol  Screen    Last Alcohol  Screening Score (AUDIT): 1  Housing: Unknown (09/05/2023)   Epic    Unable to Pay for Housing in the Last Year: No    Number of Times Moved in the Last Year: Not on file    Homeless in the Last Year: No  Utilities: Not At Risk (09/05/2023)   Epic    Threatened with loss of utilities: No  Health Literacy: Adequate Health Literacy (09/05/2023)   B1300 Health Literacy    Frequency of need for help with medical instructions: Never     Family History: The patient's ***family history includes Heart disease in his father and mother.  EKGs/Labs/Other Studies Reviewed:    The following studies were reviewed  today: ***      Recent Labs: 12/29/2023: TSH 3.506 12/31/2023: Magnesium  1.9 01/07/2024: ALT 40; BUN 16; Creatinine, Ser 0.80; Hemoglobin 12.1; Platelets 488; Potassium 4.2; Sodium 127  Recent Lipid Panel    Component Value Date/Time   CHOL 111 12/30/2023 1155   TRIG 167 (H) 12/30/2023 1155   HDL 24 (L) 12/30/2023 1155   CHOLHDL 4.6 12/30/2023 1155   VLDL 33 12/30/2023 1155   LDLCALC 54 12/30/2023 1155   LDLDIRECT 122.0 07/18/2021 1337     Risk Assessment/Calculations:   {Does this patient have ATRIAL FIBRILLATION?:[id number]}  No BP recorded.  {Refresh Note OR Click here to enter BP  :1}***  Physical Exam:    VS:  There were no vitals taken for this visit.       Wt Readings from Last 3 Encounters:  12/26/23 155 lb (70.3 kg)  10/16/23 155 lb 9.6 oz (70.6 kg)  09/05/23 158 lb (71.7 kg)     GEN: *** Well nourished, well developed in no acute distress HEENT: Normal NECK: No JVD; No carotid bruits CARDIAC: *** S1-S2 normal, RRR, no murmurs, rubs, gallops RESPIRATORY:  Clear to auscultation without rales, wheezing or rhonchi  MUSCULOSKELETAL:  No edema; No deformity  SKIN: Warm and dry NEUROLOGIC:  Alert and oriented x 3 PSYCHIATRIC:  Normal affect       Assessment & Plan   Disposition: *** Route to primary cardiologist      {Are you ordering a CV Procedure (e.g. stress test, cath, DCCV, TEE, etc)?   Press F2        :[id number]}    Medication Adjustments/Labs and Tests Ordered: Current medicines are reviewed at length with the patient today.  Concerns regarding medicines are outlined above.  No orders of the defined types were placed in this encounter.  No orders of the defined types were placed in this encounter.   There are no Patient Instructions on file for this visit.   Signed, Caralee Morea E Naydene Kamrowski, NP  02/05/2024 10:32 PM    [facility]    [1]  No outpatient medications have been marked as taking for the 02/07/24 encounter  (Appointment) with Ariella Voit E, NP.   "

## 2024-02-05 NOTE — Telephone Encounter (Signed)
 Copied from CRM [id number]. Topic: Clinical - Medical Advice >> Feb 05, 2024  4:04 PM Charolett L wrote: Reason for CRM: Liji from [facility] stated that te patient has refused further visit

## 2024-02-06 NOTE — Telephone Encounter (Signed)
 Noted

## 2024-02-06 NOTE — Telephone Encounter (Signed)
 Will discuss at upcoming visit.

## 2024-02-07 ENCOUNTER — Ambulatory Visit: Attending: Emergency Medicine | Admitting: Emergency Medicine

## 2024-02-11 ENCOUNTER — Ambulatory Visit: Admitting: Internal Medicine

## 2024-02-19 ENCOUNTER — Ambulatory Visit: Admitting: Internal Medicine

## 2024-02-26 ENCOUNTER — Telehealth: Payer: Self-pay

## 2024-02-26 NOTE — Telephone Encounter (Signed)
 LVM for pt to call and schedule appt to complete [facility] plan of care per Dr. Rollene.

## 2024-02-27 ENCOUNTER — Ambulatory Visit: Payer: Self-pay

## 2024-02-27 NOTE — Telephone Encounter (Signed)
 Complex chief complaint - per Darlene caregiver the pt is withdrawing from alcohol  and taking an unknown amount of xanax  and caffeine  pills. Pt is foggy, weak, and sick but refuses EMS. Darlene stated, If I call the paramedics he going shoot them. He's got a lot of guns in here, a lot of guns. Alcohol  ... And a lot of guns - it's a dangerous combination. Darlene notified that Dr. Rollene will be updated on the pt situation. Please advise further.     FYI Only or Action Required?: Action required by provider: update on patient condition.  Patient was last seen in primary care on 10/16/2023 by [name], MD.  Called Nurse Triage reporting Alcohol  Problem and Opioid Overdose.   Interventions attempted: Nothing.  Symptoms are: rapidly worsening.  Triage Disposition: Go to ED Now (or PCP Triage)  Patient/caregiver understands and will follow disposition?: No, refuses disposition      Reason for Triage: Miss Darlene pt caregiver is calling stating pt is possibly going through withdrawals from nit drinking alcohol  she also states pt is taking  his medication Xanex and adderall and over counter caffeine  pills she states pt is taking way to many more than instructions and caregiver is very concerned. She states pt is an absolute mess she thinks he may have taken the whole bottle of Xanex as he states he doe snot know where they are now. Also states last night pt was screaming in severe pain and has no idea where the pain is coming from. She is also not sure if she should get pt to the hospital or not she is asking for immediate assistance .   Reason for Disposition  Patient sounds very sick or weak to the triager  Answer Assessment - Initial Assessment Questions 1. ALCOHOL  USE: Do you drink alcohol , including beer, wine or hard liquor?     Has been sober for approx 2 months per Darlene caregiver  7. DRUG PROBLEM: Are you using any other drugs? (e.g., yes/no; cocaine,  prescription medicines, etc.)     Takes xanax , OTC caffeine  pills   8. SYMPTOMS: What symptoms are you currently experiencing? (e.g., none, tremors, or shakiness, abdomen pain, vomiting, blackout spells)     Sick, foggy, weak, very weak  Protocols used: Alcohol  Use and Problems-A-AH

## 2024-02-27 NOTE — Telephone Encounter (Signed)
 I would recommend EMS or police due to safety concerns and possible overdose. I would recommend to consider him to voluntarily let police keep the guns temporarily while he is not okay.

## 2024-02-27 NOTE — Telephone Encounter (Signed)
 No number to return call to [facility], caregiver. LVM at pt's home for someone to call office concerning this matter.

## 2024-03-03 ENCOUNTER — Ambulatory Visit: Admitting: Emergency Medicine
